# Patient Record
Sex: Male | Born: 1945 | Race: White | Hispanic: No | Marital: Married | Smoking: Former smoker
Health system: Southern US, Community
[De-identification: ages and names within clinical notes are randomized; demographics above are authoritative.]

## PROBLEM LIST (undated history)

## (undated) DIAGNOSIS — G709 Myoneural disorder, unspecified: Secondary | ICD-10-CM

## (undated) DIAGNOSIS — E041 Nontoxic single thyroid nodule: Secondary | ICD-10-CM

## (undated) DIAGNOSIS — G4733 Obstructive sleep apnea (adult) (pediatric): Secondary | ICD-10-CM

## (undated) DIAGNOSIS — R011 Cardiac murmur, unspecified: Secondary | ICD-10-CM

## (undated) DIAGNOSIS — E785 Hyperlipidemia, unspecified: Secondary | ICD-10-CM

## (undated) DIAGNOSIS — R053 Chronic cough: Secondary | ICD-10-CM

## (undated) DIAGNOSIS — M1990 Unspecified osteoarthritis, unspecified site: Secondary | ICD-10-CM

## (undated) DIAGNOSIS — T7840XA Allergy, unspecified, initial encounter: Secondary | ICD-10-CM

## (undated) DIAGNOSIS — D649 Anemia, unspecified: Secondary | ICD-10-CM

## (undated) DIAGNOSIS — C801 Malignant (primary) neoplasm, unspecified: Secondary | ICD-10-CM

## (undated) DIAGNOSIS — E291 Testicular hypofunction: Secondary | ICD-10-CM

## (undated) DIAGNOSIS — K648 Other hemorrhoids: Secondary | ICD-10-CM

## (undated) DIAGNOSIS — I10 Essential (primary) hypertension: Secondary | ICD-10-CM

## (undated) DIAGNOSIS — K228 Other specified diseases of esophagus: Secondary | ICD-10-CM

## (undated) DIAGNOSIS — G4730 Sleep apnea, unspecified: Secondary | ICD-10-CM

## (undated) DIAGNOSIS — K2281 Esophageal polyp: Secondary | ICD-10-CM

## (undated) DIAGNOSIS — R05 Cough: Secondary | ICD-10-CM

## (undated) DIAGNOSIS — F419 Anxiety disorder, unspecified: Secondary | ICD-10-CM

## (undated) DIAGNOSIS — E119 Type 2 diabetes mellitus without complications: Secondary | ICD-10-CM

## (undated) DIAGNOSIS — Z8719 Personal history of other diseases of the digestive system: Secondary | ICD-10-CM

## (undated) DIAGNOSIS — I350 Nonrheumatic aortic (valve) stenosis: Secondary | ICD-10-CM

## (undated) DIAGNOSIS — E039 Hypothyroidism, unspecified: Secondary | ICD-10-CM

## (undated) DIAGNOSIS — G629 Polyneuropathy, unspecified: Secondary | ICD-10-CM

## (undated) DIAGNOSIS — Z5189 Encounter for other specified aftercare: Secondary | ICD-10-CM

## (undated) DIAGNOSIS — K219 Gastro-esophageal reflux disease without esophagitis: Secondary | ICD-10-CM

## (undated) HISTORY — DX: Personal history of other diseases of the digestive system: Z87.19

## (undated) HISTORY — DX: Sleep apnea, unspecified: G47.30

## (undated) HISTORY — DX: Nonrheumatic aortic (valve) stenosis: I35.0

## (undated) HISTORY — PX: TONSILLECTOMY: SUR1361

## (undated) HISTORY — DX: Myoneural disorder, unspecified: G70.9

## (undated) HISTORY — DX: Allergy, unspecified, initial encounter: T78.40XA

## (undated) HISTORY — PX: BIOPSY THYROID: PRO38

## (undated) HISTORY — DX: Unspecified osteoarthritis, unspecified site: M19.90

## (undated) HISTORY — DX: Anemia, unspecified: D64.9

## (undated) HISTORY — PX: PROSTATE BIOPSY: SHX241

## (undated) HISTORY — DX: Testicular hypofunction: E29.1

## (undated) HISTORY — DX: Other specified diseases of esophagus: K22.8

## (undated) HISTORY — DX: Obstructive sleep apnea (adult) (pediatric): G47.33

## (undated) HISTORY — DX: Encounter for other specified aftercare: Z51.89

## (undated) HISTORY — PX: ANAL FISSURE REPAIR: SHX2312

## (undated) HISTORY — DX: Gastro-esophageal reflux disease without esophagitis: K21.9

## (undated) HISTORY — DX: Type 2 diabetes mellitus without complications: E11.9

## (undated) HISTORY — DX: Polyneuropathy, unspecified: G62.9

## (undated) HISTORY — DX: Chronic cough: R05.3

## (undated) HISTORY — PX: UPPER GASTROINTESTINAL ENDOSCOPY: SHX188

## (undated) HISTORY — DX: Essential (primary) hypertension: I10

## (undated) HISTORY — DX: Esophageal polyp: K22.81

## (undated) HISTORY — PX: NOSE SURGERY: SHX723

## (undated) HISTORY — DX: Hyperlipidemia, unspecified: E78.5

## (undated) HISTORY — DX: Anxiety disorder, unspecified: F41.9

## (undated) HISTORY — PX: OTHER SURGICAL HISTORY: SHX169

## (undated) HISTORY — DX: Other hemorrhoids: K64.8

## (undated) HISTORY — DX: Cough: R05

---

## 1997-07-25 ENCOUNTER — Encounter: Payer: Self-pay | Admitting: Pulmonary Disease

## 1997-09-02 ENCOUNTER — Encounter: Payer: Self-pay | Admitting: Pulmonary Disease

## 1997-09-02 ENCOUNTER — Ambulatory Visit: Admission: RE | Admit: 1997-09-02 | Discharge: 1997-09-02 | Payer: Self-pay

## 1997-10-07 ENCOUNTER — Encounter: Payer: Self-pay | Admitting: Pulmonary Disease

## 1998-02-27 ENCOUNTER — Other Ambulatory Visit: Admission: RE | Admit: 1998-02-27 | Discharge: 1998-02-27 | Payer: Self-pay | Admitting: Internal Medicine

## 1999-05-28 HISTORY — PX: LAPAROSCOPIC NISSEN FUNDOPLICATION: SHX1932

## 1999-06-12 ENCOUNTER — Encounter: Payer: Self-pay | Admitting: Surgery

## 1999-06-17 ENCOUNTER — Inpatient Hospital Stay (HOSPITAL_COMMUNITY): Admission: RE | Admit: 1999-06-17 | Discharge: 1999-06-19 | Payer: Self-pay | Admitting: Surgery

## 1999-06-18 ENCOUNTER — Encounter: Payer: Self-pay | Admitting: Surgery

## 2000-10-13 ENCOUNTER — Encounter: Payer: Self-pay | Admitting: Internal Medicine

## 2000-10-13 ENCOUNTER — Ambulatory Visit (HOSPITAL_COMMUNITY): Admission: RE | Admit: 2000-10-13 | Discharge: 2000-10-13 | Payer: Self-pay | Admitting: Internal Medicine

## 2001-01-07 ENCOUNTER — Ambulatory Visit (HOSPITAL_COMMUNITY): Admission: RE | Admit: 2001-01-07 | Discharge: 2001-01-07 | Payer: Self-pay | Admitting: Pulmonary Disease

## 2001-01-07 ENCOUNTER — Encounter: Payer: Self-pay | Admitting: Pulmonary Disease

## 2001-01-10 ENCOUNTER — Ambulatory Visit (HOSPITAL_COMMUNITY): Admission: RE | Admit: 2001-01-10 | Discharge: 2001-01-10 | Payer: Self-pay | Admitting: Pulmonary Disease

## 2001-01-12 ENCOUNTER — Ambulatory Visit (HOSPITAL_COMMUNITY): Admission: RE | Admit: 2001-01-12 | Discharge: 2001-01-12 | Payer: Self-pay | Admitting: Pulmonary Disease

## 2001-04-20 ENCOUNTER — Ambulatory Visit (HOSPITAL_COMMUNITY): Admission: RE | Admit: 2001-04-20 | Discharge: 2001-04-20 | Payer: Self-pay | Admitting: Pulmonary Disease

## 2001-04-20 ENCOUNTER — Encounter: Payer: Self-pay | Admitting: Pulmonary Disease

## 2001-07-18 ENCOUNTER — Encounter: Payer: Self-pay | Admitting: Internal Medicine

## 2002-01-16 ENCOUNTER — Encounter: Payer: Self-pay | Admitting: Neurosurgery

## 2002-01-16 ENCOUNTER — Encounter: Admission: RE | Admit: 2002-01-16 | Discharge: 2002-01-16 | Payer: Self-pay | Admitting: Neurosurgery

## 2003-10-27 ENCOUNTER — Encounter: Admission: RE | Admit: 2003-10-27 | Discharge: 2003-10-27 | Payer: Self-pay | Admitting: Neurosurgery

## 2004-01-31 ENCOUNTER — Ambulatory Visit: Payer: Self-pay | Admitting: Pulmonary Disease

## 2004-02-12 ENCOUNTER — Ambulatory Visit: Payer: Self-pay | Admitting: Pulmonary Disease

## 2004-02-17 ENCOUNTER — Encounter (HOSPITAL_COMMUNITY): Admission: RE | Admit: 2004-02-17 | Discharge: 2004-03-28 | Payer: Self-pay | Admitting: Internal Medicine

## 2004-02-17 ENCOUNTER — Ambulatory Visit: Payer: Self-pay | Admitting: Internal Medicine

## 2004-02-18 ENCOUNTER — Encounter: Payer: Self-pay | Admitting: Internal Medicine

## 2004-02-18 ENCOUNTER — Ambulatory Visit (HOSPITAL_COMMUNITY): Admission: RE | Admit: 2004-02-18 | Discharge: 2004-02-18 | Payer: Self-pay | Admitting: Internal Medicine

## 2004-02-21 ENCOUNTER — Ambulatory Visit: Payer: Self-pay | Admitting: Internal Medicine

## 2004-03-04 ENCOUNTER — Ambulatory Visit: Payer: Self-pay | Admitting: Internal Medicine

## 2004-03-20 ENCOUNTER — Ambulatory Visit: Payer: Self-pay | Admitting: Pulmonary Disease

## 2004-10-08 ENCOUNTER — Ambulatory Visit: Payer: Self-pay | Admitting: Pulmonary Disease

## 2005-03-29 HISTORY — PX: KNEE ARTHROSCOPY: SUR90

## 2005-04-06 ENCOUNTER — Ambulatory Visit: Payer: Self-pay | Admitting: Pulmonary Disease

## 2006-03-07 ENCOUNTER — Ambulatory Visit: Payer: Self-pay | Admitting: Pulmonary Disease

## 2006-03-07 LAB — CONVERTED CEMR LAB
ALT: 28 units/L (ref 0–40)
AST: 21 units/L (ref 0–37)
Albumin: 3.9 g/dL (ref 3.5–5.2)
Alkaline Phosphatase: 76 units/L (ref 39–117)
Basophils Relative: 0 % (ref 0.0–1.0)
Calcium: 9.3 mg/dL (ref 8.4–10.5)
Chloride: 105 meq/L (ref 96–112)
Creatinine, Ser: 0.9 mg/dL (ref 0.4–1.5)
GFR calc non Af Amer: 92 mL/min
HCT: 48.2 % (ref 39.0–52.0)
MCHC: 33.7 g/dL (ref 30.0–36.0)
Monocytes Absolute: 0.5 10*3/uL (ref 0.2–0.7)
Monocytes Relative: 5.2 % (ref 3.0–11.0)
Neutro Abs: 7.8 10*3/uL — ABNORMAL HIGH (ref 1.4–7.7)
PSA: 1.9 ng/mL (ref 0.10–4.00)
Platelets: 272 10*3/uL (ref 150–400)
Potassium: 4.3 meq/L (ref 3.5–5.1)
RBC: 5.25 M/uL (ref 4.22–5.81)
Sed Rate: 18 mm/hr (ref 0–20)

## 2006-08-18 ENCOUNTER — Ambulatory Visit: Payer: Self-pay | Admitting: Pulmonary Disease

## 2006-08-18 LAB — CONVERTED CEMR LAB
ALT: 29 units/L (ref 0–40)
AST: 22 units/L (ref 0–37)
Albumin: 3.9 g/dL (ref 3.5–5.2)
Alkaline Phosphatase: 71 units/L (ref 39–117)
Chloride: 107 meq/L (ref 96–112)
Eosinophils Absolute: 0.2 10*3/uL (ref 0.0–0.6)
Eosinophils Relative: 2.9 % (ref 0.0–5.0)
GFR calc Af Amer: 111 mL/min
GFR calc non Af Amer: 92 mL/min
Lymphocytes Relative: 21.3 % (ref 12.0–46.0)
MCV: 88 fL (ref 78.0–100.0)
Neutrophils Relative %: 67.1 % (ref 43.0–77.0)
Platelets: 262 10*3/uL (ref 150–400)
Potassium: 4.1 meq/L (ref 3.5–5.1)
RBC: 4.95 M/uL (ref 4.22–5.81)
RDW: 12.5 % (ref 11.5–14.6)
TSH: 1.33 microintl units/mL (ref 0.35–5.50)
Total Bilirubin: 0.9 mg/dL (ref 0.3–1.2)
Total Protein: 6.5 g/dL (ref 6.0–8.3)
WBC: 7.6 10*3/uL (ref 4.5–10.5)

## 2007-03-21 ENCOUNTER — Telehealth: Payer: Self-pay | Admitting: Pulmonary Disease

## 2007-07-04 ENCOUNTER — Telehealth: Payer: Self-pay | Admitting: Pulmonary Disease

## 2007-07-05 ENCOUNTER — Encounter: Payer: Self-pay | Admitting: Pulmonary Disease

## 2007-07-05 DIAGNOSIS — K219 Gastro-esophageal reflux disease without esophagitis: Secondary | ICD-10-CM

## 2007-07-05 DIAGNOSIS — R51 Headache: Secondary | ICD-10-CM

## 2007-07-05 DIAGNOSIS — I10 Essential (primary) hypertension: Secondary | ICD-10-CM

## 2007-07-05 DIAGNOSIS — G4733 Obstructive sleep apnea (adult) (pediatric): Secondary | ICD-10-CM

## 2007-07-05 DIAGNOSIS — E669 Obesity, unspecified: Secondary | ICD-10-CM

## 2007-07-05 DIAGNOSIS — K589 Irritable bowel syndrome without diarrhea: Secondary | ICD-10-CM

## 2007-07-05 DIAGNOSIS — F411 Generalized anxiety disorder: Secondary | ICD-10-CM

## 2007-07-05 DIAGNOSIS — R519 Headache, unspecified: Secondary | ICD-10-CM | POA: Insufficient documentation

## 2007-07-18 ENCOUNTER — Ambulatory Visit: Payer: Self-pay | Admitting: Pulmonary Disease

## 2007-07-22 DIAGNOSIS — G629 Polyneuropathy, unspecified: Secondary | ICD-10-CM | POA: Insufficient documentation

## 2008-03-11 ENCOUNTER — Telehealth: Payer: Self-pay | Admitting: Pulmonary Disease

## 2008-03-13 ENCOUNTER — Telehealth: Payer: Self-pay | Admitting: Pulmonary Disease

## 2008-03-21 ENCOUNTER — Ambulatory Visit: Payer: Self-pay | Admitting: Pulmonary Disease

## 2008-03-25 ENCOUNTER — Telehealth (INDEPENDENT_AMBULATORY_CARE_PROVIDER_SITE_OTHER): Payer: Self-pay | Admitting: *Deleted

## 2008-03-27 ENCOUNTER — Telehealth (INDEPENDENT_AMBULATORY_CARE_PROVIDER_SITE_OTHER): Payer: Self-pay | Admitting: *Deleted

## 2008-03-27 ENCOUNTER — Emergency Department (HOSPITAL_COMMUNITY): Admission: EM | Admit: 2008-03-27 | Discharge: 2008-03-27 | Payer: Self-pay | Admitting: Family Medicine

## 2008-03-28 ENCOUNTER — Telehealth (INDEPENDENT_AMBULATORY_CARE_PROVIDER_SITE_OTHER): Payer: Self-pay | Admitting: *Deleted

## 2008-04-17 ENCOUNTER — Telehealth (INDEPENDENT_AMBULATORY_CARE_PROVIDER_SITE_OTHER): Payer: Self-pay | Admitting: *Deleted

## 2008-04-17 ENCOUNTER — Ambulatory Visit: Payer: Self-pay | Admitting: Pulmonary Disease

## 2008-04-17 DIAGNOSIS — M1990 Unspecified osteoarthritis, unspecified site: Secondary | ICD-10-CM

## 2008-04-21 LAB — CONVERTED CEMR LAB
ALT: 29 units/L (ref 0–53)
Bacteria, UA: NEGATIVE
Bilirubin, Direct: 0.1 mg/dL (ref 0.0–0.3)
CO2: 32 meq/L (ref 19–32)
Chloride: 106 meq/L (ref 96–112)
Cholesterol: 161 mg/dL (ref 0–200)
Creatinine, Ser: 0.9 mg/dL (ref 0.4–1.5)
Direct LDL: 72 mg/dL
Eosinophils Absolute: 0.5 10*3/uL (ref 0.0–0.7)
Glucose, Bld: 104 mg/dL — ABNORMAL HIGH (ref 70–99)
HCT: 42.5 % (ref 39.0–52.0)
Hemoglobin, Urine: NEGATIVE
Hemoglobin: 14.9 g/dL (ref 13.0–17.0)
MCHC: 35.1 g/dL (ref 30.0–36.0)
MCV: 91.5 fL (ref 78.0–100.0)
Mucus, UA: NEGATIVE
Neutro Abs: 4.6 10*3/uL (ref 1.4–7.7)
Neutrophils Relative %: 61.8 % (ref 43.0–77.0)
Nitrite: NEGATIVE
PSA: 1.63 ng/mL (ref 0.10–4.00)
RBC / HPF: NONE SEEN
RBC: 4.65 M/uL (ref 4.22–5.81)
Sodium: 141 meq/L (ref 135–145)
Squamous Epithelial / LPF: NEGATIVE /lpf
TSH: 1.28 microintl units/mL (ref 0.35–5.50)
Total Bilirubin: 0.8 mg/dL (ref 0.3–1.2)
Total Protein, Urine: NEGATIVE mg/dL
Urine Glucose: NEGATIVE mg/dL
VLDL: 48 mg/dL — ABNORMAL HIGH (ref 0–40)
WBC, UA: NONE SEEN cells/hpf
WBC: 7.4 10*3/uL (ref 4.5–10.5)
pH: 6 (ref 5.0–8.0)

## 2008-05-06 ENCOUNTER — Telehealth (INDEPENDENT_AMBULATORY_CARE_PROVIDER_SITE_OTHER): Payer: Self-pay | Admitting: *Deleted

## 2008-06-22 ENCOUNTER — Encounter: Payer: Self-pay | Admitting: Pulmonary Disease

## 2008-07-20 ENCOUNTER — Encounter: Payer: Self-pay | Admitting: Pulmonary Disease

## 2008-08-16 ENCOUNTER — Telehealth: Payer: Self-pay | Admitting: Internal Medicine

## 2008-08-19 ENCOUNTER — Ambulatory Visit: Payer: Self-pay | Admitting: Internal Medicine

## 2008-08-19 DIAGNOSIS — R1319 Other dysphagia: Secondary | ICD-10-CM | POA: Insufficient documentation

## 2008-08-22 ENCOUNTER — Encounter: Payer: Self-pay | Admitting: Pulmonary Disease

## 2008-08-30 ENCOUNTER — Encounter: Payer: Self-pay | Admitting: Internal Medicine

## 2008-08-30 ENCOUNTER — Ambulatory Visit: Payer: Self-pay | Admitting: Internal Medicine

## 2008-08-30 ENCOUNTER — Encounter: Payer: Self-pay | Admitting: Pulmonary Disease

## 2008-09-02 ENCOUNTER — Encounter: Payer: Self-pay | Admitting: Internal Medicine

## 2008-09-16 DIAGNOSIS — K648 Other hemorrhoids: Secondary | ICD-10-CM | POA: Insufficient documentation

## 2008-09-16 DIAGNOSIS — Z8719 Personal history of other diseases of the digestive system: Secondary | ICD-10-CM

## 2008-09-16 DIAGNOSIS — D130 Benign neoplasm of esophagus: Secondary | ICD-10-CM

## 2008-09-16 DIAGNOSIS — Z862 Personal history of diseases of the blood and blood-forming organs and certain disorders involving the immune mechanism: Secondary | ICD-10-CM

## 2008-09-20 ENCOUNTER — Ambulatory Visit: Payer: Self-pay | Admitting: Internal Medicine

## 2008-11-01 ENCOUNTER — Telehealth: Payer: Self-pay | Admitting: Pulmonary Disease

## 2008-12-25 ENCOUNTER — Ambulatory Visit: Payer: Self-pay | Admitting: Pulmonary Disease

## 2008-12-25 ENCOUNTER — Encounter: Payer: Self-pay | Admitting: Adult Health

## 2008-12-25 DIAGNOSIS — E559 Vitamin D deficiency, unspecified: Secondary | ICD-10-CM | POA: Insufficient documentation

## 2008-12-25 DIAGNOSIS — E785 Hyperlipidemia, unspecified: Secondary | ICD-10-CM

## 2008-12-25 LAB — CONVERTED CEMR LAB: Vit D, 25-Hydroxy: 24 ng/mL — ABNORMAL LOW (ref 30–89)

## 2008-12-26 ENCOUNTER — Encounter: Payer: Self-pay | Admitting: Adult Health

## 2008-12-26 LAB — CONVERTED CEMR LAB
BUN: 12 mg/dL (ref 6–23)
Basophils Relative: 0.4 % (ref 0.0–3.0)
CO2: 33 meq/L — ABNORMAL HIGH (ref 19–32)
Calcium: 8.8 mg/dL (ref 8.4–10.5)
Eosinophils Absolute: 0.2 10*3/uL (ref 0.0–0.7)
Eosinophils Relative: 3.2 % (ref 0.0–5.0)
GFR calc non Af Amer: 90.79 mL/min (ref >60)
HDL: 30.3 mg/dL — ABNORMAL LOW (ref >39.00)
Hemoglobin: 15 g/dL (ref 13.0–17.0)
MCHC: 34.2 g/dL (ref 30.0–36.0)
Monocytes Absolute: 0.4 10*3/uL (ref 0.1–1.0)
Neutro Abs: 5 10*3/uL (ref 1.4–7.7)
Platelets: 201 10*3/uL (ref 150.0–400.0)
Potassium: 4 meq/L (ref 3.5–5.1)
RDW: 12.7 % (ref 11.5–14.6)
Sodium: 143 meq/L (ref 135–145)

## 2009-05-21 ENCOUNTER — Ambulatory Visit: Payer: Self-pay | Admitting: Pulmonary Disease

## 2009-05-24 LAB — CONVERTED CEMR LAB
ALT: 25 units/L (ref 0–53)
Albumin: 3.9 g/dL (ref 3.5–5.2)
BUN: 13 mg/dL (ref 6–23)
Basophils Relative: 0.2 % (ref 0.0–3.0)
Bilirubin, Direct: 0.2 mg/dL (ref 0.0–0.3)
CO2: 31 meq/L (ref 19–32)
Calcium: 9 mg/dL (ref 8.4–10.5)
Chloride: 106 meq/L (ref 96–112)
Cholesterol: 173 mg/dL (ref 0–200)
Creatinine, Ser: 1.2 mg/dL (ref 0.4–1.5)
Eosinophils Absolute: 0.1 10*3/uL (ref 0.0–0.7)
Glucose, Bld: 106 mg/dL — ABNORMAL HIGH (ref 70–99)
HDL: 37.3 mg/dL — ABNORMAL LOW (ref >39.00)
Hemoglobin: 14.8 g/dL (ref 13.0–17.0)
Lymphocytes Relative: 14.8 % (ref 12.0–46.0)
MCHC: 33.1 g/dL (ref 30.0–36.0)
Monocytes Relative: 11.1 % (ref 3.0–12.0)
Neutrophils Relative %: 72.8 % (ref 43.0–77.0)
PSA: 1.65 ng/mL (ref 0.10–4.00)
RBC: 4.76 M/uL (ref 4.22–5.81)
Total Protein: 6.9 g/dL (ref 6.0–8.3)
Triglycerides: 102 mg/dL (ref 0.0–149.0)
Vit D, 25-Hydroxy: 34 ng/mL (ref 30–89)
WBC: 6.9 10*3/uL (ref 4.5–10.5)

## 2009-05-26 ENCOUNTER — Telehealth (INDEPENDENT_AMBULATORY_CARE_PROVIDER_SITE_OTHER): Payer: Self-pay | Admitting: *Deleted

## 2010-02-04 ENCOUNTER — Encounter: Payer: Self-pay | Admitting: Cardiology

## 2010-02-04 ENCOUNTER — Ambulatory Visit: Payer: Self-pay | Admitting: Cardiology

## 2010-02-04 DIAGNOSIS — R0602 Shortness of breath: Secondary | ICD-10-CM | POA: Insufficient documentation

## 2010-02-06 ENCOUNTER — Telehealth: Payer: Self-pay | Admitting: Cardiology

## 2010-02-09 ENCOUNTER — Telehealth: Payer: Self-pay | Admitting: Cardiology

## 2010-02-12 ENCOUNTER — Telehealth (INDEPENDENT_AMBULATORY_CARE_PROVIDER_SITE_OTHER): Payer: Self-pay | Admitting: Radiology

## 2010-02-16 ENCOUNTER — Ambulatory Visit: Payer: Self-pay

## 2010-02-16 ENCOUNTER — Telehealth: Payer: Self-pay | Admitting: Cardiology

## 2010-02-16 ENCOUNTER — Ambulatory Visit: Payer: Self-pay | Admitting: Cardiology

## 2010-02-16 ENCOUNTER — Ambulatory Visit: Payer: Self-pay | Admitting: Cardiovascular Disease

## 2010-02-16 ENCOUNTER — Ambulatory Visit (HOSPITAL_COMMUNITY): Admission: RE | Admit: 2010-02-16 | Discharge: 2010-02-16 | Payer: Self-pay | Admitting: Cardiology

## 2010-02-17 LAB — CONVERTED CEMR LAB
Basophils Relative: 0.6 % (ref 0.0–3.0)
Calcium: 9.1 mg/dL (ref 8.4–10.5)
Chloride: 105 meq/L (ref 96–112)
Creatinine, Ser: 1.2 mg/dL (ref 0.4–1.5)
Eosinophils Relative: 3.4 % (ref 0.0–5.0)
Hemoglobin: 14.9 g/dL (ref 13.0–17.0)
Lymphocytes Relative: 19.3 % (ref 12.0–46.0)
MCV: 90.5 fL (ref 78.0–100.0)
Neutrophils Relative %: 68.1 % (ref 43.0–77.0)
RBC: 4.74 M/uL (ref 4.22–5.81)
Sodium: 142 meq/L (ref 135–145)
Testosterone: 159.68 ng/dL — ABNORMAL LOW (ref 350.00–890.00)
WBC: 8.8 10*3/uL (ref 4.5–10.5)

## 2010-02-23 ENCOUNTER — Telehealth (INDEPENDENT_AMBULATORY_CARE_PROVIDER_SITE_OTHER): Payer: Self-pay | Admitting: *Deleted

## 2010-02-26 ENCOUNTER — Ambulatory Visit: Payer: Self-pay | Admitting: Cardiology

## 2010-02-26 ENCOUNTER — Telehealth (INDEPENDENT_AMBULATORY_CARE_PROVIDER_SITE_OTHER): Payer: Self-pay | Admitting: *Deleted

## 2010-03-03 ENCOUNTER — Encounter: Payer: Self-pay | Admitting: Pulmonary Disease

## 2010-04-02 ENCOUNTER — Ambulatory Visit
Admission: RE | Admit: 2010-04-02 | Discharge: 2010-04-02 | Payer: Self-pay | Source: Home / Self Care | Attending: Cardiology | Admitting: Cardiology

## 2010-04-09 ENCOUNTER — Telehealth (INDEPENDENT_AMBULATORY_CARE_PROVIDER_SITE_OTHER): Payer: Self-pay | Admitting: *Deleted

## 2010-04-09 ENCOUNTER — Ambulatory Visit
Admission: RE | Admit: 2010-04-09 | Discharge: 2010-04-09 | Payer: Self-pay | Source: Home / Self Care | Attending: Cardiology | Admitting: Cardiology

## 2010-04-09 ENCOUNTER — Other Ambulatory Visit: Payer: Self-pay | Admitting: Cardiology

## 2010-04-09 LAB — BASIC METABOLIC PANEL
BUN: 17 mg/dL (ref 6–23)
CO2: 32 mEq/L (ref 19–32)
Calcium: 8.9 mg/dL (ref 8.4–10.5)
Chloride: 102 mEq/L (ref 96–112)
Creatinine, Ser: 1.1 mg/dL (ref 0.4–1.5)
GFR: 72.49 mL/min (ref >60.00)
Glucose, Bld: 99 mg/dL (ref 70–99)
Potassium: 4 mEq/L (ref 3.5–5.1)
Sodium: 138 mEq/L (ref 135–145)

## 2010-04-13 LAB — TESTOSTERONE: Testosterone: 204.65 ng/dL — ABNORMAL LOW (ref 350.00–890.00)

## 2010-04-15 ENCOUNTER — Telehealth: Payer: Self-pay | Admitting: Cardiology

## 2010-04-28 NOTE — Progress Notes (Signed)
Summary: fax labs  Phone Note Call from Patient Call back at [phone]   Caller: Patient Call For: nadel Summary of Call: please fax blood work results to pt - [phone] Initial call taken by: Eugene Gavia,  May 26, 2009 9:35 AM  Follow-up for Phone Call        faxed labs//Juanita Follow-up by: Darletta Moll,  May 26, 2009 10:48 AM

## 2010-04-28 NOTE — Progress Notes (Signed)
Summary: RE BLOOD WORK ORDER  Phone Note Call from Patient Call back at Home Phone [phone]   Caller: Spouse/FAYE Reason for Call: Talk to Nurse Summary of Call: PT WIFE WOULD LIKE TO TALK TO A NURSE RE BLOOD WORK ORDER. PT WOULD LIKE TO ADD SOME MORE TEST. Initial call taken by: Roe Coombs,  February 09, 2010 9:31 AM  Follow-up for Phone Call        spoke with pt wife, pt is scheduled for bmp on 02/16/10. they questioned if pt could get tsh and testerone done at that time for dr nadel. will foward for dr Jens Som review Deliah Goody, RN  February 09, 2010 3:47 PM\par  Additional Follow-up for Phone Call Additional follow up Details #1::        ok Ferman Hamming, MD, [facility]  February 10, 2010 9:19 AM  labs ordered Deliah Goody, RN  February 10, 2010 10:36 AM

## 2010-04-28 NOTE — Progress Notes (Signed)
Summary: talk to nurse  Phone Note Call from Patient Call back at [phone]   Caller: Patient Call For: Ronald King Reason for Call: Talk to Nurse Summary of Call: Patient wanting to speak to Dr. Jodelle Green nurse.  Pt would not give anymore info. Initial call taken by: Lehman Prom,  February 23, 2010 11:24 AM  Follow-up for Phone Call        Spoke with pt.  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  He states that he had this before and was txed with "pills", but d/c'ed a few years ago due to increased BP.  He states that he feels like he has the flu "all the time" and relates this to low T.  Would like to know what SN's recs are.  I will print labs for his review.  Pls advise thanks! Follow-up by: Vernie Murders,  February 23, 2010 2:16 PM  Additional Follow-up for Phone Call Additional follow up Details #1::        called and spoke with pt and he is aware per SN---since the low T  we can either try androgel 5 grams---packets--#30   rub contents into skin daily----or refer to urology---explained this to the pt and he is wanting to know about restarting the striant 30mg --he used to take 2 tablets daily for this but it caused his BP to be elevated and he was told to stop this med---he wants to know if this med will be ok to restart at a lower dose.  please advise. thanks Randell Loop CMA  February 23, 2010 5:36 PM     Additional Follow-up for Phone Call Additional follow up Details #2::    per SN----he can try the gel but if that does not work then we will  need to send him to urology for eval. Randell Loop CMA  February 24, 2010 8:51 AM    PT informed taht Androgel was called to pharmacy.  Pt will give it a try and let us know if it is helping. Abigail Miyamoto RN  February 24, 2010 9:04 AM   New/Updated Medications: ANDROGEL 50 MG/5GM GEL (TESTOSTERONE) Apply one packet to skin daily Prescriptions: ANDROGEL  50 MG/5GM GEL (TESTOSTERONE) Apply one packet to skin daily  #30 x 6   Entered by:   Abigail Miyamoto RN   Authorized by:   Michele Mcalpine MD   Signed by:   Abigail Miyamoto RN on 02/24/2010   Method used:   Telephoned to ...       [facility]  [address] [id number]* (retail)       [address]       [place], [place]  [address]       Ph: [phone] or [phone]       Fax: [phone]   RxID:   [id number]

## 2010-04-28 NOTE — Progress Notes (Signed)
  Phone Note Other Incoming   Caller: Ronald King here for testing Summary of Call: Ronald King brought a copy of his bp readings as we had ask him to track it at home. he reports bp in the am ranging from 127/73 to 143/85 and reports higher bp in the evenings ranging from 153/82 to 146/82. he reports some dizziness in the evenings with the elevated bp. will foward for dr Jens Som review. Deliah Goody, RN  February 16, 2010 10:00 AM   Follow-up for Phone Call        add lisinopril 10 mg by mouth daily; bmet one week Ferman Hamming, MD, [facility]  February 17, 2010 11:14 AM  Left message to call back Deliah Goody, RN  February 17, 2010 1:49 PM  Ronald King returning call [phone] Judie Grieve  February 17, 2010 2:00 PM   Additional Follow-up for Phone Call Additional follow up Details #1::        spoke with Ronald King, med called to pharm. he will have labs rechecked next week Deliah Goody, RN  February 17, 2010 3:36 PM     New/Updated Medications: LISINOPRIL 10 MG TABS (LISINOPRIL) Take one tablet by mouth daily Prescriptions: LISINOPRIL 10 MG TABS (LISINOPRIL) Take one tablet by mouth daily  #30 x 12   Entered by:   Deliah Goody, RN   Authorized by:   Ferman Hamming, MD, [facility]   Signed by:   Deliah Goody, RN on 02/17/2010   Method used:   Electronically to        [facility] [id number]* (retail)       [address]       [place], [place]  [address]       Ph: [phone] or [phone]       Fax: [phone]   RxID:   [id number]

## 2010-04-28 NOTE — Progress Notes (Signed)
Summary: Androgel PA - form faxed  Phone Note Call from Patient Call back at [phone]   Caller: Patient Call For: nadel Summary of Call: Pt states that pharmcacy hasn't filled his rx for androgel yet, calling to inquire about this.[facility] [address] Initial call taken by: Darletta Moll,  February 26, 2010 2:32 PM  Follow-up for Phone Call        Spoke with pt.  He states prior auth needed for androgel.  PA request received from [facility].    Called [employer] to initiate PA.  Case # [id number] -- awaiting fax   Follow-up by: Gweneth Dimitri RN,  February 26, 2010 3:03 PM  Additional Follow-up for Phone Call Additional follow up Details #1::        PA received from [employer] and placed on SN's cart to address.  Gweneth Dimitri RN  February 26, 2010 3:42 PM  PA form filled out and signed by [facility] and faxed back to [employer].  the pt has been called and informed of this.  will hold in PA inbox in EMR. Boone Master CNA/MA  February 26, 2010 5:32 PM     Additional Follow-up for Phone Call Additional follow up Details #2::    Approval letter recieved- med approved from 02/05/10 until 02/25/15.  Faxed this information to [facility] [address] and approval letter given to front desk to be scanned in.  I spoke with pt and advised PA approved.  Follow-up by: Vernie Murders,  March 02, 2010 4:50 PM

## 2010-04-28 NOTE — Assessment & Plan Note (Signed)
Summary: rov ///kp   Primary Care Provider:  Lorin Picket Zyann Mabry,MD  CC:  13 month ROV & review of mult medical problems....  History of Present Illness: 65 y/o WM here for a follow up visit... he has a chronic cough x years and is dependent on codeine containing cough syrups... he was miserable when ENDAL HD went off the market and we had to substitute PHENERGAN EXPECTORANT w/ CODEINE - 1 tsp by mouth Q6H as needed for cough, limiting him to 1 pint per month...    ~  April 17, 2008:  he notes that he's been more SOB recently and went to [facility] for eval w/ neb Rx given + Avelox for bronchitis & improved... we discussed Asthmatic Bronchitis and decided to Rx w/ Symbicort 80- 2spBid... he wants to be sure we do full labs w/ his physical- including VitD level...   ~  May 21, 2009:  Yearly ROV w/ coincident URI- cough, min sputum, no color or blood, temp to 101 as noted> we discussed checking CXR/ labs- Rx w/ ZPak, Advair, Mucinex, Fluids... he has mult somatic complaints- stopped the Symbicort stating that it gave him HAs...    Current Problem List:  Hx of SINUSITIS (ICD-473.9) - on NASONEX 2spBid + he requests Rx for AMOXICILLIN to keep on hand for travel... prev eval DrByers- "he wanted to do surg"... "my sinuses are crazy" but he refuses to consider the surg... also had dysphonia eval at [facility] in the 90's w/ Botox tried.  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 saw DrClance 12/09- he felt upper airway resistance syndrome... doing better w/ new CPAP machine in 2010.  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 he had an extensive eval in the past w/ GI, ENT, Pulm, etc... he uses PHEN EXPECT w/ CODEINE 1 tsp Q6H up to 1 pint/mo...  ~  CXR 1/10 showed clear,  just min biapical pleural thickening...  ~  2/11: states intol to Symbicort w/ HAs... try ADVAIR 250Bid...  ~  CXR 2/11 showed clear lungs, NAD...  HYPERTENSION (ICD-401.9) - on NORVASC 10mg /d... BP= 124/768... denies  HA, fatigue, visual changes, CP, palipit, dizziness, syncope, dyspnea, edema, etc...  DYSLIPIDEMIA (ICD-272.4) - hx elevated TG & unable to control on diet alone... now on TRICOR 145mg /d...  ~  FLP 1/10 showed TChol 161, TG 239, HDL 30, LDL 72... he wants to control w/ diet.  ~  FLP 9/10 showed TChol 192, TG 426, HDL 30, LDL 80... rec> start [id number].  ~  FLP 2/11 showed TChol 173, TG 102, HDL 37, LDL 115... continue Tricor Rx + diet.  OBESITY (ICD-278.00) - weight = 267#,  6\' 2"  tall = BMI of 34-35... discussed diet + exercise and the need for weight reduction both from his overall health status and his OSA.Marland Kitchen.  ~  weight 1/10 = 267#  ~  weight 2/11 = 272#... discussed again!  GERD (ICD-530.81) - on NEXIUM 40mg Bid & REGLAN 10mg  Qhs... s/p lap nissen 3/01 by DrMartin....  ~  EGD 11/05 showed prev Nissen, +gastritis...  ~  EGD 6/10 by DrDBrodie showed functioning Nissen, post-op changes, sessile polyp in distal esoph= benign mucosa.  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  ~  f/u colonoscopy 6/10 by DrDBrodie showed negative x for int hems...  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 ? didn't tol the hormone medication...   ~  labs 1/10 showed PSA= 1.63  ~  labs 2/11 showed PSA= 1.65  DEGENERATIVE JOINT DISEASE (ICD-715.90) - he had left knee arthroscopy in 2007 by DrDean to remove cartilage...  VITAMIN D DEFICIENCY (ICD-268.9) - on Vit D OTC 2000 u daily...  ~  labs 1/10 showed Vit D level = 16... Vit D 2000 u daily started.  ~  labs 2/11 showed Vit D level = 34... continue supplement.  HEADACHE (ICD-784.0) - Chr daily migraines in the past w/ prev HA eval by DrFreeman (Botox shots helped in the past)...  NEUROPATHY (ICD-355.9) - c/o burning in legs and eval from Neuro in [facility] & Rx w/ Neurontin + Hydrocodone for pain... this may be part of a familial trait as several relatives  have severe periph neuropathy (?hereditary syndrome)- we don't have notes from [facility] neurology.  ANXIETY (ICD-300.00) - on PAXIL 10mg /d & KLONOPIN 1mg  as needed... they were prev perscribed by ?psychiatrist? but he notes "I've been on these for >24yrs"... he still sees DrStevens, but we fill his perscriptions.  Health Maintenance - he takes ASA 81mg /d, Vits + Vit C... he is up to date on all [facility]...   Allergies: 1)  Ceftin (Cefuroxime Axetil) 2)  Singulair (Montelukast Sodium)  Comments:  Nurse/Medical Assistant: The patient's medications and allergies were reviewed with the patient and were updated in the Medication and Allergy Lists.  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 Father died age 26 w/ cerebral aneurysm, hx alcoholism Mother alive age 41 in nursing home w/ severe neuropathy 3 Siblings- all brothers w/ AFib, migraines... Family History of Colitis/Crohn's: Daughter ? crohns Family History of Heart Disease: Father, Grandparents, Maternal Uncle Family History of Diabetes: Maternal Uncle No FH of Colon Cancer:  Social History: Reviewed history from 04/17/2008 and no changes required. Married- 43 yrs 1 Daughter- ?hx Crohn's disease, stress... 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  The patient denies anorexia, fever, weight loss, weight gain, vision loss, decreased hearing, hoarseness, chest pain, syncope, dyspnea on exertion, peripheral edema, headaches, hemoptysis, abdominal pain, melena, hematochezia, severe indigestion/heartburn, hematuria, incontinence, muscle weakness, suspicious skin lesions, transient blindness, difficulty walking, depression, unusual weight change, abnormal bleeding, enlarged lymph nodes, and angioedema.    Vital Signs:  Patient profile:   66 year old male Height:      74 inches Weight:      272.25 pounds O2 Sat:      97 % on Room air Temp:     101 degrees F oral Pulse rate:   97 / minute BP sitting:   124 / 76  (left arm) Cuff size:   large  Vitals Entered By: Randell Loop CMA (May 21, 2009 10:12 AM)  O2 Sat at Rest %:  97 O2 Flow:  Room air CC: 13 month ROV & review of mult medical  problems... Is Patient Diabetic? No Comments meds updated today   Physical Exam  Additional Exam:  WD, Obese, 65 y/o WM in NAD... GENERAL:  Alert & oriented; pleasant & cooperative... HEENT:  [place]/AT, EOM-wnl, PERRLA, EACs-clear, TMs-wnl, NOSE-clear, THROAT-clear & wnl. NECK:  Supple w/ fairROM; no JVD; normal carotid impulses w/o bruits; no thyromegaly or nodules palpated; no lymphadenopathy. CHEST:  Clear to P & A; without wheezes/ rales/ or rhonchi; he has an intermittent dry irritative cough... HEART:  Regular Rhythm; without murmurs/ rubs/ or gallops. ABDOMEN:  Obese, soft & nontender; normal bowel sounds; no organomegaly or masses detected.  EXT: without deformities, mild arthritic changes; no varicose veins/ venous insuffic/ or edema. NEURO:  CN's intact; motor testing normal; no focal neuro deficits DERM:  No lesions noted; no rash  etc...    CXR  Procedure date:  05/21/2009  Findings:      CHEST - 2 VIEW Comparison: PA and lateral chest 04/17/2008.   Findings: Lungs are clear.  Heart size is normal.  No pleural effusion or focal bony abnormality.   IMPRESSION: Negative chest.   Read By:  Charyl Dancer,  M.D.   MISC. Report  Procedure date:  05/21/2009  Findings:      Lipid Panel (LIPID)   Cholesterol               173 mg/dL                   3-810   Triglycerides             102.0 mg/dL                 1.7-510.2   HDL                  [L]  58.52 mg/dL                 >77.82   LDL Cholesterol      [H]  423 mg/dL                   5-36  BMP (METABOL)   Sodium                    140 mEq/L                   135-145   Potassium                 4.2 mEq/L                   3.5-5.1   Chloride                  106 mEq/L                   96-112   Carbon Dioxide            31 mEq/L                    19-32   Glucose              [H]  106 mg/dL                   14-43   BUN                       13 mg/dL  6-23   Creatinine                1.2 mg/dL                   1.1-9.1   Calcium                   9.0 mg/dL                   4.7-82.9   GFR                       65.06 mL/min                >60  Hepatic/Liver Function Panel (HEPATIC)   Total Bilirubin           0.8 mg/dL                   5.6-2.1   Direct Bilirubin          0.2 mg/dL                   3.0-8.6   Alkaline Phosphatase      53 U/L                      39-117   AST                       23 U/L                      0-37   ALT                       25 U/L                      0-53   Total Protein             6.9 g/dL                    5.7-8.4   Albumin                   3.9 g/dL                    6.9-6.2  Comments:      CBC Platelet w/Diff (CBCD)   White Cell Count          6.9 K/uL                    4.5-10.5   Red Cell Count            4.76 Mil/uL                 4.22-5.81   Hemoglobin                14.8 g/dL                    95.2-84.1   Hematocrit                44.7 %                      39.0-52.0   MCV  94.0 fl                     78.0-100.0   Platelet Count            188.0 K/uL                  150.0-400.0   Neutrophil %              72.8 %                      43.0-77.0   Lymphocyte %              14.8 %                      12.0-46.0   Monocyte %                11.1 %                      3.0-12.0   Eosinophils%              1.1 %                       0.0-5.0   Basophils %               0.2 %                       0.0-3.0  TSH (TSH)   FastTSH                   1.31 uIU/mL                 0.35-5.50  Prostate Specific Antigen (PSA)   PSA-Hyb                   1.65 ng/mL                  0.10-4.00  Vitamin D (25-Hydroxy) (10272)  Vitamin D (25-Hydroxy)                             34 ng/mL                    30-89    Impression & Recommendations:  Problem # 1:  UPPER RESPIRATORY INFECTION (ICD-465.9) We will Rx w/ ZPak on this occas... His updated medication list for this problem includes:    Promethazine-codeine 6.25-10 Mg/99ml Syrp (Promethazine-codeine) .Marland Kitchen... Take 1 tsp by mouth q 6 h as needed for severe cough... not to exceed 1 pint per month.    Aspirin Adult Low Strength 81 Mg Tbec (Aspirin) .Marland Kitchen... Take 1 tablet by mouth once a day  Orders: T-2 View CXR (71020TC)  Problem # 2:  Hx of SINUSITIS (ICD-473.9) He wants refill of Amox for Prn use. His updated medication list for this problem includes:    Nasonex 50 Mcg/act Susp (Mometasone furoate) .Marland Kitchen... 2 sprays in each nostril two times a day...    Promethazine-codeine 6.25-10 Mg/2ml Syrp (Promethazine-codeine) .Marland Kitchen... Take 1 tsp by mouth q 6 h as needed for severe cough... not to exceed 1 pint per month.    Amoxicillin 500 Mg Caps (Amoxicillin) .Marland Kitchen... Take 1 cap by mouth three times a day for infection...    Zithromax Z-pak 250  Mg Tabs (Azithromycin) .Marland Kitchen... Take as directed...  Problem # 3:  OBSTRUCTIVE  SLEEP APNEA (ICD-327.23) Continue the CPAP Rx...  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 he is quite dependent on the Phen Expect w/ Codeine... Orders: T-2 View CXR (71020TC)  Problem # 5:  HYPERTENSION (ICD-401.9) Controlled-  same meds. His updated medication list for this problem includes:    Norvasc 10 Mg Tabs (Amlodipine besylate) .Marland Kitchen... Take 1 tablet by mouth once a day  Problem # 6:  DYSLIPIDEMIA (ICD-272.4) Improved on the Tricor... continue same. His updated medication list for this problem includes:    Tricor 145 Mg Tabs (Fenofibrate) .Marland Kitchen... Take 1 tablet by mouth once a day  Problem # 7:  OBESITY (ICD-278.00) We discussed weight loss again...  Problem # 8:  GERD (ICD-530.81) Followed by drDBrodie... continue present Rx. The following medications were removed from the medication list:    Prevacid 30 Mg Cpdr (Lansoprazole) .Marland Kitchen... Take 1 capsule by mouth two times a day His updated medication list for this problem includes:    Nexium 40 Mg Cpdr (Esomeprazole magnesium) .Marland Kitchen... 1 capsule twice a day 30 minutes before meals  Problem # 9:  VITAMIN D DEFICIENCY (ICD-268.9) Vit D level imoproved... continue Vit D supplement.  Problem # 10:  OTHER PROBLEMS AS NOTED>>>  Complete Medication List: 1)  Nasonex 50 Mcg/act Susp (Mometasone furoate) .... 2 sprays in each nostril two times a day... 2)  Advair Diskus 250-50 Mcg/dose Aepb (Fluticasone-salmeterol) .Marland Kitchen.. 1 inhalation two times a day... 3)  Promethazine-codeine 6.25-10 Mg/69ml Syrp (Promethazine-codeine) .... Take 1 tsp by mouth q 6 h as needed for severe cough... not to exceed 1 pint per month. 4)  Aspirin Adult Low Strength 81 Mg Tbec (Aspirin) .... Take 1 tablet by mouth once a day 5)  Norvasc 10 Mg Tabs (Amlodipine besylate) .... Take 1 tablet by mouth once a day 6)  Tricor 145 Mg Tabs (Fenofibrate) .... Take 1 tablet by mouth once a day 7)  Nexium 40 Mg Cpdr  (Esomeprazole magnesium) .Marland Kitchen.. 1 capsule twice a day 30 minutes before meals 8)  Reglan 10 Mg Tabs (Metoclopramide hcl) .... Take 1 tab by mouth at bedtime 9)  Calcium-vitamin D 250-125 Mg-unit Tabs (Calcium carbonate-vitamin d) .... Take 1 tablet by mouth once a day 10)  Centrum Silver Tabs (Multiple vitamins-minerals) .... Take 1 tablet by mouth once a day 11)  Vitamin D 2000 Unit Tabs (Cholecalciferol) .... Take 1 tablet by mouth once a day 12)  Paxil 10 Mg Tabs (Paroxetine hcl) .... Take 1 tablet by mouth  once daily 13)  Klonopin 1 Mg Tabs (Clonazepam) .... Take 1 tablet by mouth two times a day as needed for nerves... 14)  Amoxicillin 500 Mg Caps (Amoxicillin) .... Take 1 cap by mouth three times a day for infection... 15)  Zithromax Z-pak 250 Mg Tabs (Azithromycin) .... Take as directed...  Other Orders: Prescription Created Electronically [id number]) TLB-Lipid Panel (80061-LIPID) TLB-BMP (Basic Metabolic Panel-BMET) (80048-METABOL) TLB-Hepatic/Liver Function Pnl (80076-HEPATIC) TLB-CBC Platelet - w/Differential (85025-CBCD) TLB-TSH (Thyroid Stimulating Hormone) (84443-TSH) TLB-PSA (Prostate Specific Antigen) (84153-PSA) T-Vitamin D (25-Hydroxy) (78295-62130)  Patient Instructions: 1)  Today we updated your med list- see below.... 2)  We refilled your meds for 2011... 3)  Today we wrote a new perscription for ADVAIR- one inhalation two times a day... 4)  We also did your follow up CXR & FASTING blood work... please call the "phone  tree" in a few days for your lab results.Marland KitchenMarland Kitchen 5)  Let's get on track w/ our diet + exercise- the goal is to lose 15-20 lbs... 6)  Call for any questions.Marland KitchenMarland Kitchen 7)  Please schedule a follow-up appointment in 1 year, sooner as needed... Prescriptions: ZITHROMAX Z-PAK 250 MG TABS (AZITHROMYCIN) take as directed...  #1 x 0   Entered and Authorized by:   Michele Mcalpine MD   Signed by:   Michele Mcalpine MD on 05/21/2009   Method used:   Print then Give to Patient    RxID:   [id number] AMOXICILLIN 500 MG CAPS (AMOXICILLIN) take 1 cap by mouth three times a day for infection...  #21 x 2   Entered and Authorized by:   Michele Mcalpine MD   Signed by:   Michele Mcalpine MD on 05/21/2009   Method used:   Print then Give to Patient   RxID:   [id number] REGLAN 10 MG TABS (METOCLOPRAMIDE HCL) Take 1 tab by mouth at bedtime  #30 x prn   Entered and Authorized by:   Michele Mcalpine MD   Signed by:   Michele Mcalpine MD on 05/21/2009   Method used:   Print then Give to Patient   RxID:   [id number] NEXIUM 40 MG  CPDR (ESOMEPRAZOLE MAGNESIUM) 1 capsule twice a day 30 minutes before meals  #60 x prn   Entered and Authorized by:   Michele Mcalpine MD   Signed by:   Michele Mcalpine MD on 05/21/2009   Method used:   Print then Give to Patient   RxID:   [id number] TRICOR 145 MG TABS (FENOFIBRATE) Take 1 tablet by mouth once a day  #30 x prn   Entered and Authorized by:   Michele Mcalpine MD   Signed by:   Michele Mcalpine MD on 05/21/2009   Method used:   Print then Give to Patient   RxID:   [id number] PROMETHAZINE-CODEINE 6.25-10 MG/5ML  SYRP (PROMETHAZINE-CODEINE) take 1 tsp by mouth Q 6 H as needed for severe cough... not to exceed 1 pint per month.  #1 pint x prn   Entered and Authorized by:   Michele Mcalpine MD   Signed by:   Michele Mcalpine MD on 05/21/2009   Method used:   Print then Give to Patient   RxID:   [id number] ADVAIR DISKUS 250-50 MCG/DOSE AEPB (FLUTICASONE-SALMETEROL) 1 inhalation two times a day...  #1 x prn   Entered and Authorized by:   Michele Mcalpine MD   Signed by:   Michele Mcalpine MD on 05/21/2009   Method used:   Print then Give to Patient   RxID:   [id number] NASONEX 50 MCG/ACT SUSP (MOMETASONE FUROATE) 2 sprays in each nostril two times a day...  #1 x prn   Entered and Authorized by:   Michele Mcalpine MD   Signed by:   Michele Mcalpine MD on 05/21/2009   Method used:   Print then Give to Patient   RxID:    [id number] NORVASC 10 MG  TABS (AMLODIPINE BESYLATE) Take 1 tablet by mouth once a day  #30 x prn   Entered and Authorized by:   Michele Mcalpine MD   Signed by:   Michele Mcalpine MD on 05/21/2009   Method used:   Print then Give to Patient   RxID:   [id number]

## 2010-04-28 NOTE — Progress Notes (Signed)
Summary: Stress Echo Info  Phone Note Outgoing Call Call back at Home Phone [phone]   Call placed by: Harlow Asa, CNMT Call placed to: Patient Reason for Call: Confirm/change Appt Summary of Call: Instructions given for Stress Echo.

## 2010-04-28 NOTE — Progress Notes (Signed)
Summary: question on meds  Phone Note Call from Patient Call back at cell [phone]   Caller: Patient Reason for Call: Talk to Nurse Summary of Call: pt has question on meds. Initial call taken by: Roe Coombs,  February 06, 2010 1:48 PM  Follow-up for Phone Call        Phone Call Completed PT CORRECT IN TAKING 1/2 TAB OF HCTZ 25 MG  Follow-up by: Scherrie Bateman, LPN,  February 06, 2010 2:51 PM

## 2010-04-28 NOTE — Assessment & Plan Note (Signed)
Summary: np6/sob,blood pressure/self ref/bcbs  Medications Added PAXIL CR 25 MG XR24H-TAB (PAROXETINE HCL) Take 1 tablet by mouth once a day HYDROCODONE-ACETAMINOPHEN 2.5-500 MG TABS (HYDROCODONE-ACETAMINOPHEN) as needed ZICAM COLD REMEDY  LIQD (HOMEOPATHIC PRODUCTS) as needed RA CALCIUM PLUS VITAMIN D 600-400 MG-UNIT TABS (CALCIUM CARBONATE-VITAMIN D) Take 1 tablet by mouth once a day VITAMIN C CR 500 MG CR-CAPS (ASCORBIC ACID) Take 1 capsule by mouth once a day VITAMIN B-6 CR 200 MG CR-TABS (PYRIDOXINE HCL) Take 1 tablet by mouth once a day VITAMIN B-12 2500 MCG SUBL (CYANOCOBALAMIN) Take 1 tablet by mouth once a day AMOXICILLIN 500 MG CAPS (AMOXICILLIN) as needed HYDROCHLOROTHIAZIDE 25 MG TABS (HYDROCHLOROTHIAZIDE) 1/2 TAB once daily        Visit Type:  Follow-up Primary Provider:  Lorin Picket [name],MD  CC:  Sob-aching in ribs-fatigue.  History of Present Illness: 65 year old male with no prior cardiac history for evaluation of dyspnea and chest pain as well as hypertension. Patient has had hypertension for years. It has worsened recently and he increased his Norvasc from 5-10 mg. His systolic continues to be in the 140-150 range. He also notices some dyspnea on exertion but no orthopnea or PND. He has chronic mild pedal edema. He also notices pain in his chest at times. He does have a history of chronic bronchitis with a chronic cough. He feels sore in his ribs. He also occasionally has pain in the left upper chest area and he attributed to indigestion. Note he does not have exertional chest pain. Because of the above we were asked to further evaluate.   Current Medications (verified): 1)  Nasonex 50 Mcg/act Susp (Mometasone Furoate) .... 2 Sprays in Each Nostril Two Times A Day... 2)  Promethazine-Codeine 6.25-10 Mg/30ml  Syrp (Promethazine-Codeine) .... Take 1 Tsp By Mouth Q 6 H As Needed For Severe Cough... Not To Exceed 1 Pint Per Month. 3)  Aspirin Adult Low Strength 81 Mg Tbec (Aspirin)  .... Take 1 Tablet By Mouth Once A Day 4)  Norvasc 10 Mg  Tabs (Amlodipine Besylate) .... Take 1 Tablet By Mouth Once A Day 5)  Tricor 145 Mg Tabs (Fenofibrate) .... Take 1 Tablet By Mouth Once A Day 6)  Nexium 40 Mg  Cpdr (Esomeprazole Magnesium) .Marland Kitchen.. 1 Capsule Twice A Day 30 Minutes Before Meals 7)  Centrum Silver  Tabs (Multiple Vitamins-Minerals) .... Take 1 Tablet By Mouth Once A Day 8)  Vitamin D 2000 Unit Tabs (Cholecalciferol) .... Take 1 Tablet By Mouth Once A Day 9)  Paxil Cr 25 Mg Xr24h-Tab (Paroxetine Hcl) .... Take 1 Tablet By Mouth Once A Day 10)  Klonopin 1 Mg  Tabs (Clonazepam) .... Take 1 Tablet By Mouth Two Times A Day As Needed For Nerves... 11)  Hydrocodone-Acetaminophen 2.5-500 Mg Tabs (Hydrocodone-Acetaminophen) .... As Needed 12)  Zicam Cold Remedy  Liqd (Homeopathic Products) .... As Needed 13)  Ra Calcium Plus Vitamin D 600-400 Mg-Unit Tabs (Calcium Carbonate-Vitamin D) .... Take 1 Tablet By Mouth Once A Day 14)  Vitamin C Cr 500 Mg Cr-Caps (Ascorbic Acid) .... Take 1 Capsule By Mouth Once A Day 15)  Vitamin B-6 Cr 200 Mg Cr-Tabs (Pyridoxine Hcl) .... Take 1 Tablet By Mouth Once A Day 16)  Vitamin B-12 2500 Mcg Subl (Cyanocobalamin) .... Take 1 Tablet By Mouth Once A Day 17)  Amoxicillin 500 Mg Caps (Amoxicillin) .... As Needed  Allergies: 1)  Ceftin (Cefuroxime Axetil) 2)  Singulair (Montelukast Sodium)  Past History:  Past Medical History: DYSLIPIDEMIA  HYPERTENSION  POLYPS, ESOPHAGEAL ANEMIA, HX OF GASTROINTESTINAL HEMORRHAGE, HX OF  OBSTRUCTIVE SLEEP APNEA Chronic bronchitis and cough GERD  HEMORRHOIDS, INTERNAL DEGENERATIVE JOINT DISEASE  VITAMIN D DEFICIENCY NEUROPATHY   Past Surgical History: S/P lap nissen fundoplication by Dr Daphine Deutscher 3/01 S/P left knee arthroscopy in 2007 by Dr August Saucer Tonsillectomy Nasal Surgery Anal fissure Left hand fracture/surgery  Family History: Reviewed history from 09/16/2008 and no changes required. Father died age  50 w/ cerebral aneurysm, hx alcoholism Mother alive age 69 in nursing home w/ severe neuropathy 3 Siblings- all brothers w/ AFib, migraines... Family History of Colitis/Crohn's: Daughter ? crohns Family History of Diabetes: Maternal Uncle Brother with CABG at age 45  Social History: Reviewed history from 04/17/2008 and no changes required. Married- 43 yrs 1 Daughter- ?hx Crohn's disease, stress... 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 Remaining systems are negative.   Vital Signs:  Patient profile:   65 year old male Height:      74 inches Weight:      270.25 pounds BMI:     34.82 Pulse rate:   81 / minute Resp:     18 per minute BP sitting:   155 / 80  (left arm) Cuff size:   large  Vitals Entered By: Vikki Ports (February 04, 2010 10:33 AM)  Physical Exam  General:  Well developed/well nourished in NAD Skin warm/dry Patient not depressed No peripheral clubbing Back-normal HEENT-normal/normal eyelids Neck supple/normal carotid upstroke bilaterally; no bruits; no JVD; no thyromegaly chest - CTA/ normal expansion CV - RRR/normal S1 and S2; no murmurs, rubs or gallops;  PMI nondisplaced Abdomen -NT/ND, no HSM, no mass, + bowel sounds, no bruit 2+ femoral pulses, no bruits Ext-trace edema, no chords, 2+ DP Neuro-grossly nonfocal     EKG  Procedure date:  02/04/2010  Findings:      Sinus rhythm with occasional PACs. No ST changes.  Impression & Recommendations:  Problem # 1:  CHEST PAIN UNSPECIFIED (ICD-786.50) Symptoms atypical and most likely musculoskeletal. However also complains of dyspnea. Scheduled stress echocardiogram  to exclude ischemia and to quantify LV function. His updated medication list for this problem includes:    Aspirin Adult Low Strength 81 Mg Tbec (Aspirin) .Marland Kitchen... Take 1 tablet by mouth once a day    Norvasc 10 Mg Tabs (Amlodipine besylate) .Marland Kitchen... Take 1 tablet by mouth once a day  Problem # 2:  DYSPNEA (ICD-786.05)  Stress echocardiogram as described above. His updated medication list for this problem includes:    Aspirin Adult Low Strength 81 Mg Tbec (Aspirin) .Marland Kitchen... Take 1 tablet by mouth once a day    Norvasc 10 Mg Tabs (Amlodipine besylate) .Marland Kitchen... Take 1 tablet by mouth once a day    Hydrochlorothiazide 25 Mg Tabs (Hydrochlorothiazide) .Marland Kitchen... 1/2 tab once daily  His updated medication list for this problem includes:    Aspirin Adult Low Strength 81 Mg Tbec (Aspirin) .Marland Kitchen... Take 1 tablet by mouth once a day    Norvasc 10 Mg Tabs (Amlodipine besylate) .Marland Kitchen... Take 1 tablet by mouth once a day  Problem # 3:  HYPERTENSION (ICD-401.9) Blood pressure mildly elevated. Add HCTZ 12.5 mg p.o. daily. Check potassium and renal function  in one week. Patient will monitor her blood pressure at home and we will review it in 8 weeks and adjust his regimen as indicated. His updated medication list for this problem includes:    Aspirin Adult Low Strength 81 Mg Tbec (Aspirin) .Marland Kitchen... Take 1 tablet by mouth once a day    Norvasc 10 Mg Tabs (Amlodipine besylate) .Marland Kitchen... Take 1 tablet by mouth once a day    Hydrochlorothiazide 25 Mg Tabs (Hydrochlorothiazide) .Marland Kitchen... 1/2 tab once daily  Problem # 4:  DYSLIPIDEMIA (ICD-272.4) Managed by primary care. His updated medication list for this problem includes:    Tricor 145 Mg Tabs (Fenofibrate) .Marland Kitchen... Take 1 tablet by mouth once a day  Problem # 5:  OBSTRUCTIVE SLEEP APNEA (ICD-327.23)  Problem # 6:  GERD (ICD-530.81)  His updated medication list for this problem includes:    Nexium 40 Mg Cpdr (Esomeprazole magnesium) .Marland Kitchen... 1 capsule twice a day 30 minutes before  meals  His updated medication list for this problem includes:    Nexium 40 Mg Cpdr (Esomeprazole magnesium) .Marland Kitchen... 1 capsule twice a day 30 minutes before meals  Other Orders: EKG w/ Interpretation (93000) Stress Echo (Stress Echo)  Patient Instructions: 1)  Your physician recommends that you schedule a follow-up appointment in: 8 WEEKS WITH DR CRENSHAW 2)  Your physician recommends that you return for lab work in:1 WEEK BMET V58.69 3)  Your physician has recommended you make the following change in your medication: ADD HCTZ 12.5 MG 1 once daily  4)  Your physician has requested that you have a stress echocardiogram. For further information please visit [email or web address]  Please follow instruction sheet as given. Prescriptions: HYDROCHLOROTHIAZIDE 25 MG TABS (HYDROCHLOROTHIAZIDE) 1/2 TAB once daily  #30 x 11   Entered by:   Scherrie Bateman, LPN   Authorized by:   Ferman Hamming, MD, [facility]   Signed by:   Scherrie Bateman, LPN on 56/38/7564   Method used:   Electronically to        [id number]* (retail)       [address]       [place], [place]  [address]       Ph: [phone] or [phone]       Fax: [phone]   RxID:   [id number]

## 2010-04-28 NOTE — Medication Information (Signed)
Summary: Tax adviser   Imported By: Valinda Hoar 03/03/2010 09:21:32  _____________________________________________________________________  External Attachment:    Type:   Image     Comment:   External Document

## 2010-04-30 NOTE — Progress Notes (Signed)
Summary: Lab ADD ON for Testosterone  level  Phone Note Call from Patient   Caller: patient-walked in Call For: nadel Reason for Call: Lab or Test Results Summary of Call: Pt walked in stating that Dr. Jens Som wanted him to come by the office and have SN add on a lab at [facility] office for [facility] level. This has been okayed by Randell Loop to send order. Initial call taken by: Reynaldo Minium CMA,  April 09, 2010 11:55 AM

## 2010-04-30 NOTE — Assessment & Plan Note (Signed)
Summary: per check out/sf   Primary Provider:  Lorin Picket [name],MD  CC:  check up.  History of Present Illness: 65 year old male with no prior cardiac history I saw in Nov 2011 for evaluation of dyspnea and chest pain as well as hypertension. Stress echocardiogram in November of 2011 was normal. Since I saw him previously the patient denies any dyspnea on exertion, orthopnea, PND, pedal edema, palpitations, syncope or chest pain. He has taken his blood pressure at home and his systolic is typically in the 130 to 140 range.   Current Medications (verified): 1)  Nasonex 50 Mcg/act Susp (Mometasone Furoate) .... 2 Sprays in Each Nostril Two Times A Day... 2)  Promethazine-Codeine 6.25-10 Mg/44ml  Syrp (Promethazine-Codeine) .... Take 1 Tsp By Mouth Q 6 H As Needed For Severe Cough... Not To Exceed 1 Pint Per Month. 3)  Aspirin Adult Low Strength 81 Mg Tbec (Aspirin) .... Take 1 Tablet By Mouth Once A Day 4)  Norvasc 10 Mg  Tabs (Amlodipine Besylate) .... Take 1 Tablet By Mouth Once A Day 5)  Tricor 145 Mg Tabs (Fenofibrate) .... Take 1 Tablet By Mouth Once A Day 6)  Nexium 40 Mg  Cpdr (Esomeprazole Magnesium) .Marland Kitchen.. 1 Capsule Twice A Day 30 Minutes Before Meals 7)  Centrum Silver  Tabs (Multiple Vitamins-Minerals) .... Take 1 Tablet By Mouth Once A Day 8)  Vitamin D 2000 Unit Tabs (Cholecalciferol) .... Take 1 Tablet By Mouth Once A Day 9)  Paxil Cr 25 Mg Xr24h-Tab (Paroxetine Hcl) .... Take 1 Tablet By Mouth Once A Day 10)  Klonopin 1 Mg  Tabs (Clonazepam) .... Take 1 Tablet By Mouth Two Times A Day As Needed For Nerves... 11)  Hydrocodone-Acetaminophen 2.5-500 Mg Tabs (Hydrocodone-Acetaminophen) .... As Needed 12)  Zicam Cold Remedy  Liqd (Homeopathic Products) .... As Needed 13)  Ra Calcium Plus Vitamin D 600-400 Mg-Unit Tabs (Calcium Carbonate-Vitamin D) .... Take 1 Tablet By Mouth Once A Day 14)  Vitamin C Cr 500 Mg Cr-Caps (Ascorbic Acid) .... Take 1 Capsule By Mouth Once A Day 15)  Vitamin B-6  Cr 200 Mg Cr-Tabs (Pyridoxine Hcl) .... Take 1 Tablet By Mouth Once A Day 16)  Vitamin B-12 2500 Mcg Subl (Cyanocobalamin) .... Take 1 Tablet By Mouth Once A Day 17)  Amoxicillin 500 Mg Caps (Amoxicillin) .... As Needed 18)  Hydrochlorothiazide 25 Mg Tabs (Hydrochlorothiazide) .... 1/2 Tab Once Daily 19)  Lisinopril 10 Mg Tabs (Lisinopril) .... Take One Tablet By Mouth Daily 20)  Androgel 50 Mg/5gm Gel (Testosterone) .... Apply One Packet To Skin Daily  Allergies: 1)  Ceftin (Cefuroxime Axetil) 2)  Singulair (Montelukast Sodium)  Past History:  Past Medical History: Reviewed history from 02/04/2010 and no changes required. DYSLIPIDEMIA  HYPERTENSION  POLYPS, ESOPHAGEAL ANEMIA, HX OF GASTROINTESTINAL HEMORRHAGE, HX OF  OBSTRUCTIVE SLEEP APNEA Chronic bronchitis and cough GERD  HEMORRHOIDS, INTERNAL DEGENERATIVE JOINT DISEASE  VITAMIN D DEFICIENCY NEUROPATHY   Past Surgical History: Reviewed history from 02/04/2010 and no changes required. S/P lap nissen fundoplication by Dr Daphine Deutscher 3/01 S/P left knee arthroscopy in 2007 by Dr August Saucer Tonsillectomy Nasal Surgery Anal fissure Left hand fracture/surgery  Social History: Reviewed history from 02/04/2010 and no changes required. Married- 43 yrs 1 Daughter- ?hx Crohn's disease, stress... 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 Remaining systems are negative.  Vital Signs:  Patient profile:   65 year old male Height:      74 inches Weight:      269 pounds BMI:     34.66 Pulse rate:   80 / minute Resp:     16 per minute BP sitting:   118 / 78  (left arm)  Vitals Entered By: Kem Parkinson (April 02, 2010  9:16 AM)  Physical Exam  General:  Well-developed well-nourished in no acute distress.  Skin is warm and dry.  HEENT is normal.  Neck is supple. No thyromegaly.  Chest is clear to auscultation with normal expansion.  Cardiovascular exam is regular rate and rhythm.  Abdominal exam nontender or distended. No masses palpated. Extremities show no edema. neuro grossly intact    Impression & Recommendations:  Problem # 1:  DYSPNEA (ICD-786.05)  Improved. Stress echo normal. No further cardiac workup. His updated medication list for this problem includes:    Aspirin Adult Low Strength 81 Mg Tbec (Aspirin) .Marland Kitchen... Take 1 tablet by mouth once a day    Norvasc 10 Mg Tabs (Amlodipine besylate) .Marland Kitchen... Take 1 tablet by mouth once a day    Hydrochlorothiazide 25 Mg Tabs (Hydrochlorothiazide) .Marland Kitchen... 1/2 tab once daily    Lisinopril 20 Mg Tabs (Lisinopril) .Marland Kitchen... Take one tablet by mouth daily  His updated medication list for this problem includes:    Aspirin Adult Low Strength 81 Mg Tbec (Aspirin) .Marland Kitchen... Take 1 tablet by mouth once a day    Norvasc 10 Mg Tabs (Amlodipine besylate) .Marland Kitchen... Take 1 tablet by mouth once a day    Hydrochlorothiazide 25 Mg Tabs (Hydrochlorothiazide) .Marland Kitchen... 1/2 tab once daily    Lisinopril 20 Mg Tabs (Lisinopril) .Marland Kitchen... Take one tablet by mouth daily  Problem # 2:  CHEST PAIN UNSPECIFIED (ICD-786.50) As per #1. His updated medication list for this problem includes:    Aspirin Adult Low Strength 81 Mg Tbec (Aspirin) .Marland Kitchen... Take 1 tablet by mouth once a day    Norvasc 10 Mg Tabs (Amlodipine besylate) .Marland Kitchen... Take 1 tablet by mouth once a day    Lisinopril 20 Mg Tabs (Lisinopril) .Marland Kitchen... Take one tablet by mouth daily  His updated medication list for this problem includes:    Aspirin Adult Low Strength 81 Mg Tbec (Aspirin) .Marland Kitchen... Take 1 tablet by mouth once a day    Norvasc 10 Mg Tabs (Amlodipine besylate) .Marland Kitchen... Take 1 tablet by mouth once a day    Lisinopril 20 Mg Tabs  (Lisinopril) .Marland Kitchen... Take one tablet by mouth daily  Problem # 3:  HYPERTENSION (ICD-401.9) His blood pressure is mildly elevated at home. I will increase his lisinopril to 20 mg p.o. daily. Check potassium and renal function in one week. He will continue to monitor his blood pressure at home and followup with his primary care physician for further management. His updated medication list for this problem includes:    Aspirin Adult Low Strength 81 Mg Tbec (Aspirin) .Marland Kitchen... Take 1 tablet by mouth once a day    Norvasc 10 Mg Tabs (Amlodipine besylate) .Marland Kitchen... Take 1 tablet by mouth once a day    Hydrochlorothiazide 25 Mg Tabs (Hydrochlorothiazide) .Marland Kitchen... 1/2 tab once daily    Lisinopril 20 Mg Tabs (Lisinopril) .Marland Kitchen... Take one tablet by mouth daily  His updated medication list for this problem includes:    Aspirin Adult Low  Strength 81 Mg Tbec (Aspirin) .Marland Kitchen... Take 1 tablet by mouth once a day    Norvasc 10 Mg Tabs (Amlodipine besylate) .Marland Kitchen... Take 1 tablet by mouth once a day    Hydrochlorothiazide 25 Mg Tabs (Hydrochlorothiazide) .Marland Kitchen... 1/2 tab once daily    Lisinopril 20 Mg Tabs (Lisinopril) .Marland Kitchen... Take one tablet by mouth daily  Problem # 4:  DYSLIPIDEMIA (ICD-272.4) Magement per primary care. His updated medication list for this problem includes:    Tricor 145 Mg Tabs (Fenofibrate) .Marland Kitchen... Take 1 tablet by mouth once a day  Problem # 5:  OBSTRUCTIVE SLEEP APNEA (ICD-327.23)  Patient Instructions: 1)  Your physician recommends that you return for lab work in:ONE WEEK 2)  Your physician has recommended you make the following change in your medication: INCREASE LISINOPRIL 20MG  ONCE DAILY Prescriptions: LISINOPRIL 20 MG TABS (LISINOPRIL) Take one tablet by mouth daily  #30 x 12   Entered by:   Deliah Goody, RN   Authorized by:   Ferman Hamming, MD, [facility]   Signed by:   Deliah Goody, RN on 04/02/2010   Method used:   Electronically to        [facility]  [address] [id number]* (retail)       [address]       [place], [place]  [address]       Ph: [phone] or [phone]       Fax: [phone]   RxID:   [id number]

## 2010-04-30 NOTE — Progress Notes (Signed)
Summary: pt needs report schedule  Phone Note Call from Patient Call back at [phone]   Caller: Patient Reason for Call: Talk to Nurse, Talk to Doctor Summary of Call: Stanton Kidney faxed lad report to patient yesterday and he didn't get all of it and wants to talk to someone so he can get the rest of the re[port fax# [phone] Initial call taken by: Omer Jack,  April 15, 2010 9:56 AM  Follow-up for Phone Call        04/15/10--1100am--all labs faxed to pt's private fax machine Follow-up by: Ledon Snare, RN,  April 15, 2010 11:06 AM

## 2010-05-01 ENCOUNTER — Telehealth: Payer: Self-pay | Admitting: Pulmonary Disease

## 2010-05-08 ENCOUNTER — Encounter: Payer: Self-pay | Admitting: Pulmonary Disease

## 2010-05-14 NOTE — Progress Notes (Signed)
Summary: PAapproved for Nexium  Phone Note Outgoing Call   Call placed by: Michel Bickers CMA,  May 01, 2010 9:21 AM Call placed to: [facility] [phone] Summary of Call: PA initiated for Nexium for twice a day.  No covered alternatives.  Member # is [id number].  Awaiting fax.  Michel Bickers CMA  May 01, 2010 9:23 AM  Form received and placed on SN cart.  Will forward to Leigh so she is aware.Michel Bickers [facility]  May 01, 2010 10:02 AM  Follow-up for Phone Call        PA has been signed and faxed back---placed in triage bin for PA's Randell Loop CMA  May 01, 2010 11:27 AM   Auth approved from 04-10-10 until 05-01-11. Pharmacy andpt aware. Carron Curie CMA  May 06, 2010 9:40 AM

## 2010-05-14 NOTE — Medication Information (Signed)
Summary: Tax adviser   Imported By: Valinda Hoar 05/08/2010 16:31:54  _____________________________________________________________________  External Attachment:    Type:   Image     Comment:   External Document

## 2010-05-19 ENCOUNTER — Telehealth (INDEPENDENT_AMBULATORY_CARE_PROVIDER_SITE_OTHER): Payer: Self-pay | Admitting: *Deleted

## 2010-05-21 ENCOUNTER — Other Ambulatory Visit: Payer: Self-pay | Admitting: Pulmonary Disease

## 2010-05-21 ENCOUNTER — Encounter (INDEPENDENT_AMBULATORY_CARE_PROVIDER_SITE_OTHER): Payer: Self-pay | Admitting: *Deleted

## 2010-05-21 ENCOUNTER — Other Ambulatory Visit: Payer: BC Managed Care – PPO

## 2010-05-21 DIAGNOSIS — Z0000 Encounter for general adult medical examination without abnormal findings: Secondary | ICD-10-CM

## 2010-05-21 DIAGNOSIS — E785 Hyperlipidemia, unspecified: Secondary | ICD-10-CM

## 2010-05-21 LAB — HEPATIC FUNCTION PANEL
Alkaline Phosphatase: 44 U/L (ref 39–117)
Bilirubin, Direct: 0.1 mg/dL (ref 0.0–0.3)
Total Bilirubin: 0.6 mg/dL (ref 0.3–1.2)
Total Protein: 6.5 g/dL (ref 6.0–8.3)

## 2010-05-21 LAB — BASIC METABOLIC PANEL
BUN: 18 mg/dL (ref 6–23)
Chloride: 106 mEq/L (ref 96–112)
Creatinine, Ser: 1 mg/dL (ref 0.4–1.5)
Glucose, Bld: 101 mg/dL — ABNORMAL HIGH (ref 70–99)

## 2010-05-21 LAB — CBC WITH DIFFERENTIAL/PLATELET
Basophils Absolute: 0 10*3/uL (ref 0.0–0.1)
Eosinophils Absolute: 0.2 10*3/uL (ref 0.0–0.7)
Eosinophils Relative: 3.3 % (ref 0.0–5.0)
HCT: 44.1 % (ref 39.0–52.0)
Lymphs Abs: 1.5 10*3/uL (ref 0.7–4.0)
MCHC: 34.2 g/dL (ref 30.0–36.0)
MCV: 90.7 fl (ref 78.0–100.0)
Monocytes Absolute: 0.6 10*3/uL (ref 0.1–1.0)
Neutrophils Relative %: 66.4 % (ref 43.0–77.0)
Platelets: 240 10*3/uL (ref 150.0–400.0)
RDW: 14 % (ref 11.5–14.6)
WBC: 6.9 10*3/uL (ref 4.5–10.5)

## 2010-05-21 LAB — LIPID PANEL: VLDL: 49.8 mg/dL — ABNORMAL HIGH (ref 0.0–40.0)

## 2010-05-21 LAB — TSH: TSH: 1.74 u[IU]/mL (ref 0.35–5.50)

## 2010-05-21 LAB — LDL CHOLESTEROL, DIRECT: Direct LDL: 138.9 mg/dL

## 2010-05-21 LAB — PSA: PSA: 1.63 ng/mL (ref 0.10–4.00)

## 2010-05-21 LAB — TESTOSTERONE: Testosterone: 552.79 ng/dL (ref 350.00–890.00)

## 2010-05-26 NOTE — Progress Notes (Signed)
Summary: speak to nurse  Phone Note Call from Patient Call back at [phone] cell   Caller: Patient Call For: nadel Reason for Call: Talk to Nurse Summary of Call: Patient calling requesting to speak to Leigh, when  asking what it was regarding he said it concerned several things and he would rather explain to Leigh.  Told patient I would get message to nurse to call back. Initial call taken by: Lehman Prom,  May 19, 2010 3:22 PM  Follow-up for Phone Call        Spoke with pt.  He is wanting to come in and have labs done on 2/23 am b/c has ov coming up next wk.  He wants to be sure that SN orders testosterone and PSA level along with other labs.  Also he states that he "caught bronchitis" a few wks ago- took round of amoxcicillin and states getting much better and "chest has calmed down", but still c/o "felling fluish" and would like whatever pills SN gave him for flu before.  He states that he feels a little tired and achy.  I advised tylenol and rest, but he is insisting on getting "the flu pill" and states that he can not take tylenol b/c there is already 500 mg of this is his pain med that he takes two times a day. Pls advise thanks allergic to ceftin and singulair Follow-up by: Vernie Murders,  May 19, 2010 3:46 PM  Additional Follow-up for Phone Call Additional follow up Details #1::        per SN---flu is a virus no med really to take ---SN usually rx with fluids and tylenol---pt will need to tell SN what the name of the med is that he is wanting.    lab wise he can come in for these labs---lip,hepat,bmp,cbcd, tsh,testosterone, psa--v70.0.Marland KitchenMarland Kitchenthanks Randell Loop CMA  May 19, 2010 4:51 PM     Additional Follow-up for Phone Call Additional follow up Details #2::    Spoke with pt and notified of the above per SN and pt verbalized understanding.  Pt verbalized understanding.  Lab appt sched in Epic.  Follow-up by: Vernie Murders,  May 19, 2010 5:07 PM

## 2010-05-27 ENCOUNTER — Encounter: Payer: Self-pay | Admitting: Pulmonary Disease

## 2010-05-27 ENCOUNTER — Ambulatory Visit (INDEPENDENT_AMBULATORY_CARE_PROVIDER_SITE_OTHER): Payer: BC Managed Care – PPO | Admitting: Pulmonary Disease

## 2010-05-27 DIAGNOSIS — K219 Gastro-esophageal reflux disease without esophagitis: Secondary | ICD-10-CM

## 2010-05-27 DIAGNOSIS — R0602 Shortness of breath: Secondary | ICD-10-CM

## 2010-05-27 DIAGNOSIS — E785 Hyperlipidemia, unspecified: Secondary | ICD-10-CM

## 2010-05-27 DIAGNOSIS — I10 Essential (primary) hypertension: Secondary | ICD-10-CM

## 2010-05-27 DIAGNOSIS — R05 Cough: Secondary | ICD-10-CM

## 2010-05-27 DIAGNOSIS — G4733 Obstructive sleep apnea (adult) (pediatric): Secondary | ICD-10-CM

## 2010-05-27 DIAGNOSIS — R079 Chest pain, unspecified: Secondary | ICD-10-CM

## 2010-05-27 DIAGNOSIS — E669 Obesity, unspecified: Secondary | ICD-10-CM

## 2010-05-28 ENCOUNTER — Telehealth: Payer: Self-pay | Admitting: Pulmonary Disease

## 2010-06-04 NOTE — Progress Notes (Signed)
Summary: returning [facility] call  Phone Note Call from Patient   Caller: Patient Call For: Lowery Paullin Summary of Call: Patient phoned stated that he was returning [facility] call he can be reached at [phone] Initial call taken by: Vedia Coffer,  May 28, 2010 10:22 AM  Follow-up for Phone Call        returned call to pt---he is requesting for methocarbamol 750mg   #120  1 by mouth four times daily with refills and clotrimazole cream #45gm  apply as directed with 3 refills---wants these called to the pharmacy for refills of these---last filled in 2007.  please advise.  thanks Randell Loop CMA  May 28, 2010 11:18 AM   Additional Follow-up for Phone Call Additional follow up Details #1::        per SN---ok for refills of these meds and these meds have been sent into the pharmacy---pt is aware---had to call [facility] to get his nexium fixed with [facility]----called and spoke with the pharmacy and they ran this back through and are able to fill it now Randell Loop CMA  May 28, 2010 12:28 PM     New/Updated Medications: METHOCARBAMOL 750 MG TABS (METHOCARBAMOL) take one tablet by mouth four times daily as needed CLOTRIMAZOLE 1 % CREA (CLOTRIMAZOLE) apply as directed to rash Prescriptions: CLOTRIMAZOLE 1 % CREA (CLOTRIMAZOLE) apply as directed to rash  #45 gm x 5   Entered by:   Randell Loop CMA   Authorized by:   Michele Mcalpine MD   Signed by:   Randell Loop CMA on 05/28/2010   Method used:   Electronically to        [id number]* (retail)       [address]       [place], [place]  [address]       Ph: [phone] or [phone]       Fax: [phone]   RxID:   [id number] METHOCARBAMOL 750 MG TABS (METHOCARBAMOL) take one tablet by mouth four times daily as needed  #120 x 5   Entered by:   Randell Loop CMA   Authorized by:   Michele Mcalpine MD   Signed by:   Randell Loop CMA on 05/28/2010   Method used:   Electronically to        [facility]  # [id number]* (retail)       [address]       [place], [place]  [address]       Ph: [phone] or [phone]       Fax: [phone]   RxID:   [id number]

## 2010-06-09 NOTE — Assessment & Plan Note (Signed)
Summary: Ronald King   Primary Care Provider:  Lorin Picket Pasha Broad,MD  CC:  Yearly ROV & review of mult medical problems...  History of Present Illness: 65 y/o WM here for a follow up visit... he has a chronic cough x years and is dependent on codeine containing cough syrups... he was miserable when ENDAL HD went off the market and we had to substitute PHENERGAN EXPECTORANT w/ CODEINE - 1 tsp by mouth Q6H as needed for cough, limiting him to 1 pint per month...    ~  April 17, 2008:  he notes that he's been more SOB recently and went to [facility] for eval w/ neb Rx given + Avelox for bronchitis & improved... we discussed Asthmatic Bronchitis and decided to Rx w/ Symbicort 80- 2spBid... he wants to be sure we do full labs w/ his physical- including VitD level...   ~  May 21, 2009:  Yearly ROV w/ coincident URI- cough, min sputum, no color or blood, temp to 101 as noted> we discussed checking CXR/ labs- Rx w/ ZPak, Advair, Mucinex, Fluids... he has mult somatic complaints- stopped the Symbicort stating that it gave him HAs...   ~  May 27, 2010:  Yearly check up> feeling some better since starting on Androgel 5gm/d for Low-T & Testos level has improved from 204 to 553... he continues on nasal regimen for sinuses but c/o tinnitus and decr hearing- he is rec to see ENT for further eval & we will refer;  continues on phen expec w/ codeine for his chr cough;  BP controlled on his 3 med regimen;  Lipids still look poor on Fibrate alone & he still refuses Statin & Lipid Clinic eval "I can do it on diet alone";  he takes a number of vits & supplements... he sees Stage manager for Paxil & Klonopin Rx; and ?[facility]..    Current Problem List:  Hx of SINUSITIS (ICD-473.9) - on NASONEX 2spBid + he requests Rx for AMOXICILLIN to keep on hand for travel... prev eval DrByers- "he wanted to do surg"... "my sinuses are crazy" but he refuses to consider the surg... also had dysphonia eval at [facility]  in the 90's w/ Botox tried.  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 saw DrClance 12/09- he felt upper airway resistance syndrome... doing better w/ new CPAP machine in 2010.  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 he had an extensive eval in the past w/ GI, ENT, Pulm, etc... he uses PHEN EXPECT w/ CODEINE 1 tsp Q6H up to 1 pint/mo...  ~  CXR 1/10 showed clear,  just min biapical pleural thickening...  ~  2/11: states intol to Symbicort w/ HAs... try ADVAIR 250Bid...  ~  CXR 2/11 showed clear lungs, NAD...  HYPERTENSION (ICD-401.9) - meds adjusted by DrCrenshaw on NORVASC 10mg /d, LISINOPRIL 20mg /d, & HCTZ 25mg - 1/2 tab daily... BP= 120/74... denies HA, fatigue, visual changes, CP, palipit, dizziness, syncope, dyspnea, edema, etc...  DYSLIPIDEMIA (ICD-272.4) - hx elevated TG & unable to control on diet alone... now on TRICOR 145mg /d but refuses Statin meds or Lipid Clinic referral...  ~  FLP 1/10 showed TChol 161, TG 239, HDL 30, LDL 72... he wants to control w/ diet.  ~  FLP 9/10 showed TChol 192, TG 426, HDL 30, LDL 80... rec> start [id number].  ~  FLP 2/11 showed TChol 173, TG 102, HDL 37, LDL 115... continue Tricor Rx + diet.  OBESITY (ICD-278.00) - weight = 267#,  6\' 2"  tall = BMI of  34-35... discussed diet + exercise and the need for weight reduction both from his overall health status and his OSA.Marland Kitchen.  ~  weight 1/10 = 267#  ~  weight 2/11 = 272#... discussed again!  GERD (ICD-530.81) - on NEXIUM 40mg Bid & REGLAN 10mg  Qhs... s/p lap nissen 3/01 by DrMartin....  ~  EGD 11/05 showed prev Nissen, +gastritis...  ~  EGD 6/10 by DrDBrodie showed functioning Nissen, post-op changes, sessile polyp in distal esoph= benign mucosa.  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  ~  f/u colonoscopy 6/10 by DrDBrodie showed negative x for int hems...  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 ? didn't tol the hormone medication...   ~  labs 1/10 showed PSA= 1.63  ~  labs 2/11 showed PSA= 1.65  DEGENERATIVE JOINT DISEASE (ICD-715.90) - he had left knee arthroscopy in 2007 by DrDean to remove cartilage...  VITAMIN D DEFICIENCY (ICD-268.9) - on Vit D OTC 2000 u daily...  ~  labs 1/10 showed Vit D level = 16... Vit D 2000 u daily started.  ~  labs 2/11 showed Vit D level = 34... continue supplement.  HEADACHE (ICD-784.0) - Chr daily migraines in the past w/ prev HA eval by DrFreeman (Botox shots helped in the past)...  NEUROPATHY (ICD-355.9) - c/o burning in legs and eval from Neuro in [facility] & Rx w/ Neurontin + Hydrocodone for pain... this may be part of a familial trait as several relatives have severe periph neuropathy (?hereditary syndrome)- we don't have notes from [facility] neurology.  ANXIETY (ICD-300.00) - on PAXIL 10mg /d & KLONOPIN 1mg  as needed... they were prev perscribed by ?psychiatrist? but he notes "I've been on these for >27yrs"... he still sees DrStevens, but we fill his perscriptions.  Health Maintenance - he takes ASA 81mg /d, Vits + Vit C... he is up to date on all vacc[facility]...   Preventive Screening-Counseling & Management  Alcohol-Tobacco     Smoking Status: quit     Year Quit: age 57     Pack years: one ppd for 2 years  Allergies: 1)  Ceftin (Cefuroxime Axetil) 2)  Singulair (Montelukast Sodium)  Past History:  Past Medical History: DYSLIPIDEMIA  HYPERTENSION  POLYPS, ESOPHAGEAL ANEMIA, HX OF GASTROINTESTINAL HEMORRHAGE, HX OF  OBSTRUCTIVE SLEEP APNEA Chronic bronchitis and cough GERD  HEMORRHOIDS, INTERNAL DEGENERATIVE JOINT DISEASE  VITAMIN D DEFICIENCY NEUROPATHY  Past Surgical History: S/P lap nissen fundoplication by Dr Daphine Deutscher 3/01 S/P left knee arthroscopy in 2007 by Dr August Saucer Tonsillectomy Nasal Surgery Anal fissure Left hand fracture/surgery  Family History: Reviewed history from  02/04/2010 and no changes required. Father died age 60 w/ cerebral aneurysm, hx alcoholism Mother alive age 88 in nursing home w/ severe neuropathy 3 Siblings- all brothers w/ AFib, migraines... Family History of Colitis/Crohn's: Daughter ? crohns Family History of Diabetes: Maternal Uncle Brother with CABG at age 26  Social History: Reviewed history from 02/04/2010 and no changes required. Married- 43 yrs 1 Daughter- ?hx Crohn's disease, stress... 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  The patient denies anorexia, fever, weight loss, weight gain, vision loss, decreased hearing, hoarseness, chest pain, syncope, peripheral edema,  prolonged cough, headaches, hemoptysis, abdominal pain, melena, hematochezia, severe indigestion/heartburn, hematuria, incontinence, muscle weakness, suspicious skin lesions, transient blindness, difficulty walking, depression, unusual weight change, abnormal bleeding, enlarged lymph nodes, and angioedema.    Vital Signs:  Patient profile:   65 year old male Height:      74 inches Weight:      276.13 pounds BMI:     35.58 O2 Sat:      97 % on Room air Temp:     97.5 degrees F oral Pulse rate:   76 / minute BP sitting:   120 / 74  (left arm) Cuff size:   regular  Vitals Entered By: Randell Loop CMA (May 27, 2010 12:11 PM)  O2 Sat at Rest %:  97 O2 Flow:  Room air CC: Yearly ROV & review of mult medical problems.. Comments no changes in meds today   Physical Exam  Additional Exam:  WD, Obese, 65 y/o WM in NAD... GENERAL:  Alert & oriented; pleasant & cooperative... HEENT:  [place]/AT, EOM-wnl, PERRLA, EACs-clear, TMs-wnl, NOSE-clear, THROAT-clear & wnl. NECK:  Supple w/ fairROM; no JVD; normal carotid impulses w/o bruits; no thyromegaly or nodules palpated;  no lymphadenopathy. CHEST:  Clear to P & A; without wheezes/ rales/ or rhonchi; he has an intermittent dry irritative cough... HEART:  Regular Rhythm; without murmurs/ rubs/ or gallops. ABDOMEN:  Obese, soft & nontender; normal bowel sounds; no organomegaly or masses detected.  EXT: without deformities, mild arthritic changes; no varicose veins/ venous insuffic/ or edema. NEURO:  CN's intact; motor testing normal; no focal neuro deficits DERM:  No lesions noted; no rash etc...    Impression & Recommendations:  Problem # 1:  Hx of SINUSITIS (ICD-473.9) He has persistant upper airway symptoms & is c/o tinnitus and decr hearing for which we will referhim to ENt for eval... His updated medication list for this problem includes:    Nasonex 50 Mcg/act Susp (Mometasone furoate) .Marland Kitchen... 2 sprays in each nostril two times a day...    Promethazine-codeine 6.25-10 Mg/73ml Syrp (Promethazine-codeine) .Marland Kitchen... Take 1 tsp by mouth q 6 h as needed for severe cough... not to exceed 1 pint per month.    Amoxicillin-pot Clavulanate 875-125 Mg Tabs (Amoxicillin-pot clavulanate) .Marland Kitchen... Take 1 tab by mouth two times a day as directed for infection...  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 refill Phen expec w/ codeine & he is limited to 1 pt per month...  Problem # 4:  HYPERTENSION (ICD-401.9) BP controlled on 3 meds now... continue same... His updated medication list for this problem includes:    Norvasc 10 Mg Tabs (Amlodipine besylate) .Marland Kitchen... Take 1 tablet by mouth once a day    Hydrochlorothiazide 25 Mg Tabs (Hydrochlorothiazide) .Marland Kitchen... 1/2 tab once daily    Lisinopril 20 Mg Tabs (Lisinopril) .Marland Kitchen... Take one tablet by mouth daily  Problem # 5:  DYSLIPIDEMIA (ICD-272.4) He adamantly refuses statin meds or Lipid clinic referral... he will continue thr Tricor & work on diet & wt reduction. His updated  medication list for this problem includes:    Tricor 145 Mg Tabs (Fenofibrate) .Marland Kitchen... Take 1 tablet by mouth once a day  Problem # 6:  OBESITY (ICD-278.00) Wt is up to 276# & we reviewed diet & exercise perscriptions for wt reduction...  Problem # 7:  GERD (ICD-530.81) GI is stable, s/p Nissen, followed by DrDBrodie... colon is  up to date as well.Marland Kitchen His updated medication list for this problem includes:    Nexium 40 Mg Cpdr (Esomeprazole magnesium) .Marland Kitchen... 1 capsule twice a day 30 minutes before meals  Problem # 8:  OTHER MEDICAL PROBLEMS AS NOTED>>>  Complete Medication List: 1)  Nasonex 50 Mcg/act Susp (Mometasone furoate) .... 2 sprays in each nostril two times a day... 2)  Promethazine-codeine 6.25-10 Mg/42ml Syrp (Promethazine-codeine) .... Take 1 tsp by mouth q 6 h as needed for severe cough... not to exceed 1 pint per month. 3)  Aspirin Adult Low Strength 81 Mg Tbec (Aspirin) .... Take 1 tablet by mouth once a day 4)  Norvasc 10 Mg Tabs (Amlodipine besylate) .... Take 1 tablet by mouth once a day 5)  Tricor 145 Mg Tabs (Fenofibrate) .... Take 1 tablet by mouth once a day 6)  Nexium 40 Mg Cpdr (Esomeprazole magnesium) .Marland Kitchen.. 1 capsule twice a day 30 minutes before meals 7)  Centrum Silver Tabs (Multiple vitamins-minerals) .... Take 1 tablet by mouth once a day 8)  Vitamin D 2000 Unit Tabs (Cholecalciferol) .... Take 1 tablet by mouth once a day 9)  Paxil Cr 25 Mg Xr24h-tab (Paroxetine hcl) .... Take 1 tablet by mouth once a day 10)  Klonopin 1 Mg Tabs (Clonazepam) .... Take 1 tablet by mouth two times a day as needed for nerves... 11)  Hydrocodone-acetaminophen 2.5-500 Mg Tabs (Hydrocodone-acetaminophen) .... As needed 12)  Zicam Cold Remedy Liqd (Homeopathic products) .... As needed 13)  Ra Calcium Plus Vitamin D 600-400 Mg-unit Tabs (Calcium carbonate-vitamin d) .... Take 1 tablet by mouth once a day 14)  Vitamin C Cr 500 Mg Cr-caps (Ascorbic acid) .... Take 1 capsule by mouth once a  day 15)  Vitamin B-6 Cr 200 Mg Cr-tabs (Pyridoxine hcl) .... Take 1 tablet by mouth once a day 16)  Vitamin B-12 2500 Mcg Subl (Cyanocobalamin) .... Take 1 tablet by mouth once a day 17)  Amoxicillin-pot Clavulanate 875-125 Mg Tabs (Amoxicillin-pot clavulanate) .... Take 1 tab by mouth two times a day as directed for infection... 18)  Hydrochlorothiazide 25 Mg Tabs (Hydrochlorothiazide) .... 1/2 tab once daily 19)  Lisinopril 20 Mg Tabs (Lisinopril) .... Take one tablet by mouth daily 20)  Androgel 50 Mg/5gm Gel (Testosterone) .... Apply one packet to skin daily 21)  Methocarbamol 750 Mg Tabs (Methocarbamol) .... Take one tablet by mouth four times daily as needed 22)  Clotrimazole 1 % Crea (Clotrimazole) .... Apply as directed to rash  Patient Instructions: 1)  Today we updated your med list- see below.... 2)  Continue your current meds the same... 3)  We refilled your meds per request... 4)  We gave you a copy of your recent lab work> you need to get on track w/ your low carb low fat diet & lose some weight!!! 5)  Call for any questions.Marland KitchenMarland Kitchen 6)  Please schedule a follow-up appointment in 6 months, w/ repeat lab work... Prescriptions: AMOXICILLIN-POT CLAVULANATE 875-125 MG TABS (AMOXICILLIN-POT CLAVULANATE) take 1 tab by mouth two times a day as directed for infection...  #20 x 2   Entered and Authorized by:   Michele Mcalpine MD   Signed by:   Michele Mcalpine MD on 05/27/2010   Method used:   Print then Give to Patient   RxID:   [id number] NEXIUM 40 MG  CPDR (ESOMEPRAZOLE MAGNESIUM) 1 capsule twice a day 30 minutes before meals  #180 x 4   Entered and Authorized by:   Lorin Picket  Elayne Snare MD   Signed by:   Michele Mcalpine MD on 05/27/2010   Method used:   Print then Give to Patient   RxID:   [id number] TRICOR 145 MG TABS (FENOFIBRATE) Take 1 tablet by mouth once a day  #90 x 4   Entered and Authorized by:   Michele Mcalpine MD   Signed by:   Michele Mcalpine MD on 05/27/2010   Method used:    Print then Give to Patient   RxID:   [id number] NORVASC 10 MG  TABS (AMLODIPINE BESYLATE) Take 1 tablet by mouth once a day  #90 x 4   Entered and Authorized by:   Michele Mcalpine MD   Signed by:   Michele Mcalpine MD on 05/27/2010   Method used:   Print then Give to Patient   RxID:   [id number] PROMETHAZINE-CODEINE 6.25-10 MG/5ML  SYRP (PROMETHAZINE-CODEINE) take 1 tsp by mouth Q 6 H as needed for severe cough... not to exceed 1 pint per month.  #1 pint x 12   Entered and Authorized by:   Michele Mcalpine MD   Signed by:   Michele Mcalpine MD on 05/27/2010   Method used:   Print then Give to Patient   RxID:   [id number] NASONEX 50 MCG/ACT SUSP (MOMETASONE FUROATE) 2 sprays in each nostril two times a day...  #3 x 4   Entered and Authorized by:   Michele Mcalpine MD   Signed by:   Michele Mcalpine MD on 05/27/2010   Method used:   Print then Give to Patient   RxID:   [id number]

## 2010-09-24 ENCOUNTER — Other Ambulatory Visit: Payer: Self-pay | Admitting: Pulmonary Disease

## 2010-09-25 ENCOUNTER — Other Ambulatory Visit: Payer: Self-pay | Admitting: Pulmonary Disease

## 2010-09-25 MED ORDER — TESTOSTERONE 50 MG/5GM (1%) TD GEL: 5.0000 g | Freq: Every day | TRANSDERMAL | Status: DC

## 2010-09-25 NOTE — Telephone Encounter (Signed)
Called, spoke with pt.  He is requesting rx for androgel.  Rx called in -- pt aware.

## 2010-11-26 ENCOUNTER — Ambulatory Visit (INDEPENDENT_AMBULATORY_CARE_PROVIDER_SITE_OTHER)
Admission: RE | Admit: 2010-11-26 | Discharge: 2010-11-26 | Disposition: A | Discharge status: Home / Self Care | Payer: BC Managed Care – PPO | Source: Ambulatory Visit | Attending: Pulmonary Disease | Admitting: Pulmonary Disease

## 2010-11-26 ENCOUNTER — Other Ambulatory Visit (INDEPENDENT_AMBULATORY_CARE_PROVIDER_SITE_OTHER): Payer: BC Managed Care – PPO

## 2010-11-26 ENCOUNTER — Telehealth: Payer: Self-pay | Admitting: Pulmonary Disease

## 2010-11-26 ENCOUNTER — Ambulatory Visit (INDEPENDENT_AMBULATORY_CARE_PROVIDER_SITE_OTHER): Payer: BC Managed Care – PPO | Admitting: Pulmonary Disease

## 2010-11-26 ENCOUNTER — Encounter: Payer: Self-pay | Admitting: Pulmonary Disease

## 2010-11-26 VITALS — BP 134/82 | HR 73 | Temp 98.2°F | Ht 74.0 in | Wt 273.6 lb

## 2010-11-26 DIAGNOSIS — E559 Vitamin D deficiency, unspecified: Secondary | ICD-10-CM

## 2010-11-26 DIAGNOSIS — G4733 Obstructive sleep apnea (adult) (pediatric): Secondary | ICD-10-CM

## 2010-11-26 DIAGNOSIS — J209 Acute bronchitis, unspecified: Secondary | ICD-10-CM

## 2010-11-26 DIAGNOSIS — E785 Hyperlipidemia, unspecified: Secondary | ICD-10-CM

## 2010-11-26 DIAGNOSIS — E291 Testicular hypofunction: Secondary | ICD-10-CM

## 2010-11-26 DIAGNOSIS — G589 Mononeuropathy, unspecified: Secondary | ICD-10-CM

## 2010-11-26 DIAGNOSIS — F411 Generalized anxiety disorder: Secondary | ICD-10-CM

## 2010-11-26 DIAGNOSIS — E669 Obesity, unspecified: Secondary | ICD-10-CM

## 2010-11-26 DIAGNOSIS — K219 Gastro-esophageal reflux disease without esophagitis: Secondary | ICD-10-CM

## 2010-11-26 DIAGNOSIS — R05 Cough: Secondary | ICD-10-CM

## 2010-11-26 DIAGNOSIS — K589 Irritable bowel syndrome without diarrhea: Secondary | ICD-10-CM

## 2010-11-26 DIAGNOSIS — M1990 Unspecified osteoarthritis, unspecified site: Secondary | ICD-10-CM

## 2010-11-26 DIAGNOSIS — I10 Essential (primary) hypertension: Secondary | ICD-10-CM

## 2010-11-26 LAB — LIPID PANEL
Cholesterol: 194 mg/dL (ref 0–200)
LDL Cholesterol: 128 mg/dL — ABNORMAL HIGH (ref 0–99)
Triglycerides: 169 mg/dL — ABNORMAL HIGH (ref 0.0–149.0)

## 2010-11-26 NOTE — Telephone Encounter (Signed)
Pt states almost out of his phenergan with codeine cough med. Is requesting new rx for this with PRN refills as he uses this daily. Please advise thanks!

## 2010-11-26 NOTE — Progress Notes (Signed)
Subjective:    Patient ID: Ronald King, male    DOB: 1945/12/03, 65 y.o.   MRN: [id number]  HPI 65 y/o WM here for a follow up visit... he has a chronic cough x years and is dependent on codeine containing cough syrups... he was miserable when ENDAL HD went off the market and we had to substitute PHENERGAN EXPECTORANT w/ CODEINE - 1 tsp by mouth Q6H as needed for cough, limiting him to 1 pint per month...   ~  May 21, 2009:  Yearly ROV w/ coincident URI- cough, min sputum, no color or blood, temp to 101 as noted> we discussed checking CXR/ labs- Rx w/ ZPak, Advair, Mucinex, Fluids... he has mult somatic complaints- stopped the Symbicort stating that it gave him HAs...  ~  May 27, 2010:  Yearly check up> feeling some better since starting on Androgel 5gm/d for Low-T & Testos level has improved from 204 to 553... he continues on nasal regimen for sinuses but c/o tinnitus and decr hearing- he is rec to see ENT for further eval & we will refer;  continues on phen expec w/ codeine for his chr cough;  BP controlled on his 3 med regimen;  Lipids still look poor on Fibrate alone & he still refuses Statin & Lipid Clinic eval "I can do it on diet alone";  he takes a number of vits & supplements... he sees Stage manager for Paxil & Klonopin Rx; and ?DrGibson [place] [facility]..  ~  November 26, 2010:  36mo ROV & he reports recent bronchitic episode improved after Augmentin rx & c/o fatigue- doesn't have the energy he feels he should have; he wants chol, vit d & testosterone levels checked; due for CXR as well...  see prob list below >>    He reports seeing his Neurologist, DrGibson in [place] for neuropathy in his feet (feet worse numb all the time now) & they have tried several meds- Neurontin, Lyrica, one other (INTOL to these due to 'memory gaps' he says), & pt refuses Cymbalta> asked to keep me informed of his progress & try to get Neuro notes sent to me for our records.          Problem  List:  Hx of SINUSITIS (ICD-473.9) - on NASONEX 2spBid + he requests Rx for AMOXICILLIN to keep on hand for travel... prev eval DrByers- "he wanted to do surg"... "my sinuses are crazy" but he refuses to consider the surg... also had dysphonia eval at [facility] in the 90's w/ Botox tried.  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 saw DrClance 12/09- he felt upper airway resistance syndrome... doing better w/ new CPAP machine in 2010.  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 he had an extensive eval in the past w/ GI, ENT, Pulm, etc... he uses PHEN EXPECT w/ CODEINE 1 tsp Q6H up to 1 pint/mo... ~  CXR 1/10 showed clear,  just min biapical pleural thickening... ~  2/11: states intol to Symbicort w/ HAs... try ADVAIR 250Bid... ~  CXR 2/11 showed clear lungs, NAD.Marland Kitchen. ~  CXR 8/12 showed chr bronchitic changes, NAD...  HYPERTENSION (ICD-401.9) - meds adjusted by DrCrenshaw on NORVASC 10mg /d, LISINOPRIL 20mg /d, & HCTZ 25mg - 1/2 tab daily... BP= 134/82... denies HA, fatigue, visual changes, CP, palipit, dizziness, syncope, dyspnea, edema, etc...  DYSLIPIDEMIA (ICD-272.4) - hx elevated TG & unable to control on diet alone... now on TRICOR 145mg /d but refuses Statin meds or Lipid Clinic referral... ~  FLP 1/10 showed TChol  161, TG 239, HDL 30, LDL 72... he wants to control w/ diet. ~  FLP 9/10 showed TChol 192, TG 426, HDL 30, LDL 80... rec> start [id number]. ~  FLP 2/11 showed TChol 173, TG 102, HDL 37, LDL 115... continue Tricor Rx + diet. ~  FLP 2/12 showed TChol 221, TG 249, HDL 31, LDL 140... Reminded to take med regularly & get wt down. ~  FLP 8/12 showed TChol 194, TG 169, HDL 32, LDL 128... Still not at goal, refuses statin med.  OBESITY (ICD-278.00) - weight = 274#,  6\' 2"  tall = BMI of 35... discussed diet + exercise and the need for weight reduction both from his overall health status and his OSA... "I moderate what I eat". ~  weight 1/10 = 267# ~  weight 2/11 = 272#...  discussed again! ~  Weight 2/12 = 276#... He is unconcerned! ~  Weight 8/12 = 274#... Reviewed diet & exercise needed.  GERD (ICD-530.81) - on NEXIUM 40mg Bid & REGLAN 10mg  Qhs... s/p lap nissen 3/01 by DrMartin.... ~  EGD 11/05 showed prev Nissen, +gastritis... ~  EGD 6/10 by DrDBrodie showed functioning Nissen, post-op changes, sessile polyp in distal esoph= benign mucosa.  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 ~  f/u colonoscopy 6/10 by DrDBrodie showed negative x for int hems...  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  Finally started ANDROGEL 5gm packet Qd in [address] ~  labs 1/10 showed PSA= 1.63 ~  labs 2/11 showed PSA= 1.65,  ~  Labs 1/12 showed Testosterone level = 160-205 ~  Labs 2/12 showed PSA = 1.63, Testosterone level = 553  DEGENERATIVE JOINT DISEASE (ICD-715.90) - he had left knee arthroscopy in 2007 by DrDean to remove cartilage...  VITAMIN D DEFICIENCY (ICD-268.9) - on Vit D OTC 2000 u daily... ~  labs 1/10 showed Vit D level = 16... Vit D 2000 u daily started. ~  labs 2/11 showed Vit D level = 34... continue supplement. ~  Labs 8/12 showed Vit D level = 47... continue same.  HEADACHE (ICD-784.0) - Chr daily migraines in the past w/ prev HA eval by DrFreeman (Botox shots helped in the past)...  NEUROPATHY (ICD-355.9) - c/o burning in legs and eval from Neuro in [facility] & Rx w/ Neurontin + Hydrocodone for pain... this may be part of a familial trait as several relatives have severe periph neuropathy (?hereditary syndrome)- we don't have notes from [facility] neurology. ~  Pt reports that he was intol to Neurontin, Lyrica, & one other ("memory gaps") & he refused Cymbalta trial...  ANXIETY (ICD-300.00) - on PAXIL & KLONOPIN 1mg  as needed... they were prev perscribed by ?psychiatrist? but he notes "I've been on these for >25yrs"... he still sees DrStevens, but we fill  his perscriptions.  Health Maintenance - he takes ASA 81mg /d, & many Vits & supplements (states they help his RLS & leg cramps)... he is [facility] to date on [facility]...   Past Surgical History  Procedure Date  . Laparoscopic nissen fundoplication 05/1999    Dr. Daphine Deutscher  . Knee arthroscopy 2007    Dr. August Saucer  . Tonsillectomy   . Nose surgery   . Anal fissure repair   . Hand fracture surgery     left    Outpatient Encounter Prescriptions as of 11/26/2010  Medication Sig Dispense Refill  . amLODipine (NORVASC) 10 MG tablet  Take 10 mg by mouth daily.        . ANDROGEL 50 MG/5GM GEL apply 1 packet as directed once daily  30 g  5  . ascorbic acid (VITAMIN C) 500 MG tablet Take 500 mg by mouth daily.        Marland Kitchen aspirin 81 MG tablet Take 81 mg by mouth daily.        . Calcium Carbonate-Vitamin D (RA CALCIUM PLUS VITAMIN D) 600-400 MG-UNIT per tablet Take 1 tablet by mouth daily.        . Cholecalciferol (VITAMIN D) 2000 UNITS tablet Take 2,000 Units by mouth daily.        . clonazePAM (KLONOPIN) 1 MG tablet Take 1 mg by mouth 2 (two) times daily as needed.        . clotrimazole (LOTRIMIN) 1 % cream Apply topically 2 (two) times daily.        . Cyanocobalamin (VITAMIN B-12) 2500 MCG SUBL Place 1 tablet under the tongue daily.        Marland Kitchen esomeprazole (NEXIUM) 40 MG capsule Take 40 mg by mouth 2 (two) times daily.        . fenofibrate (TRICOR) 145 MG tablet Take 145 mg by mouth daily.        . Homeopathic Products (ZICAM COLD REMEDY) LIQD Take by mouth as needed.        . hydrochlorothiazide 25 MG tablet Take 12.5 mg by mouth daily.        Marland Kitchen HYDROcodone-acetaminophen (VICODIN) 2.5-500 MG per tablet Take 1 tablet by mouth every 6 (six) hours as needed.        Marland Kitchen lisinopril (PRINIVIL,ZESTRIL) 20 MG tablet Take 20 mg by mouth daily.        . methocarbamol (ROBAXIN) 750 MG tablet Take 750 mg by mouth 4 (four) times daily.        . mometasone (NASONEX) 50 MCG/ACT nasal  spray Place 2 sprays into the nose 2 (two) times daily.        . Multiple Vitamins-Minerals (CENTRUM SILVER PO) Take 1 tablet by mouth daily.        Marland Kitchen PARoxetine (PAXIL-CR) 25 MG 24 hr tablet Take 25 mg by mouth every morning.        . promethazine-codeine (PHENERGAN WITH CODEINE) 6.25-10 MG/5ML syrup Take 5 mLs by mouth every 6 (six) hours as needed.        . Pyridoxine HCl (VITAMIN B-6 CR) 200 MG TBCR Take 1 tablet by mouth daily.        Marland Kitchen testosterone (ANDROGEL) 50 MG/5GM GEL Place 5 g onto the skin daily. Apply 1 packet to skin daily  30 Package  5    Allergies  Allergen Reactions  . Cefuroxime Axetil     REACTION: unsure of reaction  . Montelukast Sodium     REACTION: unsure of reaction    Current Medications, Allergies, Past Medical History, Past Surgical History, Family History, and Social History were reviewed in [employer] record.   Review of Systems        See HPI - all other systems neg except as noted...  The patient complains of dyspnea on exertion.  The patient denies anorexia, fever, weight loss, weight gain, vision loss, decreased hearing, hoarseness, chest pain, syncope, peripheral edema, prolonged cough, headaches, hemoptysis, abdominal pain, melena, hematochezia, severe indigestion/heartburn, hematuria, incontinence, muscle weakness, suspicious skin lesions, transient blindness, difficulty walking, depression, unusual weight change, abnormal bleeding, enlarged lymph nodes, and angioedema.  Objective:   Physical Exam      WD, Obese, 65 y/o WM in NAD... GENERAL:  Alert & oriented; pleasant & cooperative... HEENT:  [place]/AT, EOM-wnl, PERRLA, EACs-clear, TMs-wnl, NOSE-clear, THROAT-clear & wnl. NECK:  Supple w/ fairROM; no JVD; normal carotid impulses w/o bruits; no thyromegaly or nodules palpated; no lymphadenopathy. CHEST:  Clear to P & A; without wheezes/ rales/ or rhonchi; he has an intermittent dry irritative cough... HEART:  Regular  Rhythm; without murmurs/ rubs/ or gallops. ABDOMEN:  Obese, soft & nontender; normal bowel sounds; no organomegaly or masses detected. EXT: without deformities, mild arthritic changes; no varicose veins/ venous insuffic/ or edema. NEURO:  CN's intact; motor testing normal; no focal neuro deficits DERM:  No lesions noted; no rash etc...   Assessment & Plan:   OSA>  He uses CPAP nightly, continue same...  Chronic Cough>  He is quite dependent on the Phen expect w/ codeine, limited to one pint per month...  HBP>  Controlled on CCB, ACE, Diuretic; continue same; he understands that substantial wt reduction would help w/ BP control & meds.  DYSLIPIDEMIA>  On Tricor + diet & not at goal; he is unconcerned & refuses statin or other med addition to regimen; explained that substantial wt reduction is his only other option & he understands this...  Obesity>  We reviewed diet & exercise...  GI> GERD, IBS, Hems>  Stable on Nexium, etc...  GU> BPH, ED, Hypogonadism>  Prev eval by drPeterson, on ANDROGEL 5gm daily w/ good testosterone level...  DJD>  Followed by DrDean w/ prev left knee arthroscopy...  Vit D Defic>  On OTC Vit D supplementation & ok...  NEUROPATHY>  Followed by DrGibson in [place] (we don't have notes to review & pt will request copies to [place])...  ANXIETY>  ?he still sees Psychiatrist, but we write Rx for Paxil & Klonopin (?)

## 2010-11-26 NOTE — Patient Instructions (Signed)
Today we updated your med list in EPIC...    Continue your current meds the same...  Today we did your follow up fasting blood work...    Please call the [facility] in a few days for your results...    Dial [id number] & when prompted enter your patient number followed by the # symbol...    Your patient number is:  [id number]  Let's get on track w/ our diet & exercise program> the goal is to lose 15-20 lbs!!!  Call for any questions...  Let's plan a follow up eval in 6 months.Marland KitchenMarland Kitchen

## 2010-11-27 MED ORDER — PROMETHAZINE-CODEINE 6.25-10 MG/5ML PO SYRP: 5.0000 mL | ORAL_SOLUTION | Freq: Four times a day (QID) | ORAL | Status: DC | PRN

## 2010-11-27 NOTE — Telephone Encounter (Signed)
Called in the medicine to the pharmacy for the pt as requested for the phenergan with codeine.  lmom to make the pt aware.

## 2010-11-29 ENCOUNTER — Encounter: Payer: Self-pay | Admitting: Pulmonary Disease

## 2011-02-27 ENCOUNTER — Other Ambulatory Visit: Payer: Self-pay | Admitting: Pulmonary Disease

## 2011-04-06 ENCOUNTER — Other Ambulatory Visit: Payer: Self-pay | Admitting: Cardiology

## 2011-05-31 ENCOUNTER — Telehealth: Payer: Self-pay | Admitting: Pulmonary Disease

## 2011-05-31 ENCOUNTER — Ambulatory Visit: Payer: BC Managed Care – PPO | Admitting: Pulmonary Disease

## 2011-05-31 DIAGNOSIS — E785 Hyperlipidemia, unspecified: Secondary | ICD-10-CM

## 2011-05-31 DIAGNOSIS — E291 Testicular hypofunction: Secondary | ICD-10-CM

## 2011-05-31 DIAGNOSIS — Z0000 Encounter for general adult medical examination without abnormal findings: Secondary | ICD-10-CM

## 2011-05-31 DIAGNOSIS — I10 Essential (primary) hypertension: Secondary | ICD-10-CM

## 2011-05-31 DIAGNOSIS — R0602 Shortness of breath: Secondary | ICD-10-CM

## 2011-05-31 NOTE — Telephone Encounter (Signed)
Called and spoke with pt. Pt states he is scheduled for CPX with SN on 3/20.  Would like to come in the week prior to have bloodwork drawn. Specifically would like PSA, Testosterone and cholesterol and "whatever else SN recommends."    Sn, please advise if you are ok with this or not.  And what labs to order.  Thanks!

## 2011-05-31 NOTE — Telephone Encounter (Signed)
Per SN;  Fasting lipid panel, BMET, liver panel, CBC with diff, tsh, psa, and ua.    Called, spoke with pt.  I informed him labs are in the computer, and I advised him he will need to be fasting for these labs.  He verbalized understanding of this and voiced no further questions/cocnerns at this time.

## 2011-06-10 ENCOUNTER — Other Ambulatory Visit (INDEPENDENT_AMBULATORY_CARE_PROVIDER_SITE_OTHER): Payer: BC Managed Care – PPO

## 2011-06-10 ENCOUNTER — Other Ambulatory Visit: Payer: Self-pay | Admitting: Pulmonary Disease

## 2011-06-10 DIAGNOSIS — E785 Hyperlipidemia, unspecified: Secondary | ICD-10-CM

## 2011-06-10 DIAGNOSIS — Z0000 Encounter for general adult medical examination without abnormal findings: Secondary | ICD-10-CM

## 2011-06-10 DIAGNOSIS — E291 Testicular hypofunction: Secondary | ICD-10-CM

## 2011-06-10 DIAGNOSIS — I10 Essential (primary) hypertension: Secondary | ICD-10-CM

## 2011-06-10 DIAGNOSIS — R0602 Shortness of breath: Secondary | ICD-10-CM

## 2011-06-10 LAB — URINALYSIS, ROUTINE W REFLEX MICROSCOPIC
Bilirubin Urine: NEGATIVE
Leukocytes, UA: NEGATIVE
Nitrite: NEGATIVE
Specific Gravity, Urine: 1.015 (ref 1.000–1.030)
Total Protein, Urine: NEGATIVE
pH: 6.5 (ref 5.0–8.0)

## 2011-06-10 LAB — CBC WITH DIFFERENTIAL/PLATELET
Basophils Relative: 0.5 % (ref 0.0–3.0)
Eosinophils Absolute: 0.2 10*3/uL (ref 0.0–0.7)
Eosinophils Relative: 2.8 % (ref 0.0–5.0)
Hemoglobin: 15 g/dL (ref 13.0–17.0)
Lymphocytes Relative: 19 % (ref 12.0–46.0)
MCHC: 33.5 g/dL (ref 30.0–36.0)
MCV: 90.4 fl (ref 78.0–100.0)
Monocytes Absolute: 0.6 10*3/uL (ref 0.1–1.0)
Neutro Abs: 4.4 10*3/uL (ref 1.4–7.7)
RBC: 4.93 Mil/uL (ref 4.22–5.81)
WBC: 6.4 10*3/uL (ref 4.5–10.5)

## 2011-06-10 LAB — BASIC METABOLIC PANEL
BUN: 18 mg/dL (ref 6–23)
Calcium: 9.1 mg/dL (ref 8.4–10.5)
Creatinine, Ser: 1.3 mg/dL (ref 0.4–1.5)
GFR: 61.66 mL/min (ref >60.00)

## 2011-06-10 LAB — LIPID PANEL
Cholesterol: 229 mg/dL — ABNORMAL HIGH (ref 0–200)
HDL: 35.2 mg/dL — ABNORMAL LOW (ref >39.00)
Total CHOL/HDL Ratio: 7
Triglycerides: 177 mg/dL — ABNORMAL HIGH (ref 0.0–149.0)
VLDL: 35.4 mg/dL (ref 0.0–40.0)

## 2011-06-10 LAB — PSA: PSA: 1.73 ng/mL (ref 0.10–4.00)

## 2011-06-10 LAB — HEPATIC FUNCTION PANEL
Bilirubin, Direct: 0 mg/dL (ref 0.0–0.3)
Total Bilirubin: 0.2 mg/dL — ABNORMAL LOW (ref 0.3–1.2)

## 2011-06-10 LAB — LDL CHOLESTEROL, DIRECT: Direct LDL: 140 mg/dL

## 2011-06-13 ENCOUNTER — Other Ambulatory Visit: Payer: Self-pay | Admitting: Pulmonary Disease

## 2011-06-16 ENCOUNTER — Ambulatory Visit (INDEPENDENT_AMBULATORY_CARE_PROVIDER_SITE_OTHER): Payer: BC Managed Care – PPO | Admitting: Pulmonary Disease

## 2011-06-16 ENCOUNTER — Encounter: Payer: Self-pay | Admitting: Pulmonary Disease

## 2011-06-16 ENCOUNTER — Telehealth: Payer: Self-pay | Admitting: Pulmonary Disease

## 2011-06-16 VITALS — BP 124/76 | HR 83 | Temp 99.4°F | Ht 74.0 in | Wt 274.0 lb

## 2011-06-16 DIAGNOSIS — E669 Obesity, unspecified: Secondary | ICD-10-CM

## 2011-06-16 DIAGNOSIS — E785 Hyperlipidemia, unspecified: Secondary | ICD-10-CM

## 2011-06-16 DIAGNOSIS — D130 Benign neoplasm of esophagus: Secondary | ICD-10-CM

## 2011-06-16 DIAGNOSIS — F411 Generalized anxiety disorder: Secondary | ICD-10-CM

## 2011-06-16 DIAGNOSIS — M1990 Unspecified osteoarthritis, unspecified site: Secondary | ICD-10-CM

## 2011-06-16 DIAGNOSIS — G589 Mononeuropathy, unspecified: Secondary | ICD-10-CM

## 2011-06-16 DIAGNOSIS — R059 Cough, unspecified: Secondary | ICD-10-CM

## 2011-06-16 DIAGNOSIS — I10 Essential (primary) hypertension: Secondary | ICD-10-CM

## 2011-06-16 DIAGNOSIS — E291 Testicular hypofunction: Secondary | ICD-10-CM

## 2011-06-16 DIAGNOSIS — G4733 Obstructive sleep apnea (adult) (pediatric): Secondary | ICD-10-CM

## 2011-06-16 DIAGNOSIS — K589 Irritable bowel syndrome without diarrhea: Secondary | ICD-10-CM

## 2011-06-16 DIAGNOSIS — R05 Cough: Secondary | ICD-10-CM

## 2011-06-16 DIAGNOSIS — K219 Gastro-esophageal reflux disease without esophagitis: Secondary | ICD-10-CM

## 2011-06-16 MED ORDER — AMLODIPINE BESYLATE 10 MG PO TABS: 10.0000 mg | ORAL_TABLET | Freq: Every day | ORAL | Status: DC

## 2011-06-16 MED ORDER — FENOFIBRATE 145 MG PO TABS: 145.0000 mg | ORAL_TABLET | Freq: Every day | ORAL | Status: DC

## 2011-06-16 MED ORDER — TESTOSTERONE 50 MG/5GM (1%) TD GEL: 5.0000 g | Freq: Every day | TRANSDERMAL | Status: DC

## 2011-06-16 MED ORDER — PROMETHAZINE-CODEINE 6.25-10 MG/5ML PO SYRP: 5.0000 mL | ORAL_SOLUTION | Freq: Four times a day (QID) | ORAL | Status: DC | PRN

## 2011-06-16 MED ORDER — ESOMEPRAZOLE MAGNESIUM 40 MG PO CPDR: 40.0000 mg | DELAYED_RELEASE_CAPSULE | Freq: Two times a day (BID) | ORAL | Status: DC

## 2011-06-16 MED ORDER — LISINOPRIL 20 MG PO TABS: 20.0000 mg | ORAL_TABLET | Freq: Every day | ORAL | Status: DC

## 2011-06-16 MED ORDER — AMOXICILLIN-POT CLAVULANATE 875-125 MG PO TABS: 1.0000 | ORAL_TABLET | Freq: Two times a day (BID) | ORAL | Status: AC

## 2011-06-16 MED ORDER — HYDROCHLOROTHIAZIDE 25 MG PO TABS: ORAL_TABLET | ORAL | Status: DC

## 2011-06-16 MED ORDER — MOMETASONE FUROATE 50 MCG/ACT NA SUSP: 2.0000 | Freq: Two times a day (BID) | NASAL | Status: DC

## 2011-06-16 NOTE — Progress Notes (Addendum)
Subjective:    Patient ID: Ronald King, male    DOB: 06/06/1945, 66 y.o.   MRN: [id number]  HPI Ronald King here for a follow up visit... he has a chronic cough x years and is dependent on codeine containing cough syrups... he was miserable when ENDAL HD went off the market and we had to substitute PHENERGAN EXPECTORANT w/ CODEINE - 1 tsp by mouth Q6H as needed for cough, limiting him to 1 pint per month...   ~  May 27, 2010:  Yearly check up> feeling some better since starting on Androgel 5gm/d for Low-T & Testos level has improved from 204 to 553... he continues on nasal regimen for sinuses but c/o tinnitus and decr hearing- he is rec to see ENT for further eval & we will refer;  continues on phen expec w/ codeine for his chr cough;  BP controlled on his 3 med regimen;  Lipids still look poor on Fibrate alone & he still refuses Statin & Lipid Clinic eval- "I can do it on diet alone";  he takes a number of vits & supplements... he sees Stage manager for Paxil & Klonopin Rx; and ?DrGibson [place] [facility]..  ~  November 26, 2010:  104mo ROV & he reports recent bronchitic episode improved after Augmentin rx & c/o fatigue- doesn't have the energy he feels he should have; he wants chol, vit d & testosterone levels checked; due for CXR as well...  see prob list below >>    He reports seeing his Neurologist, DrGibson in [place] for neuropathy in his feet (feet worse- numb all the time now) & they have tried several meds- Neurontin, Lyrica, one other (INTOL to these due to 'memory gaps' he says), & pt refuses Cymbalta> asked to keep me informed of his progress & try to get Neuro notes sent to me for our records.  ~  June 16, 2011:  47mo ROV & Taiven mentioned that he starts on Medicare in June & wanted my recs for a primary care doctor;  He has OSA- on CPAP nightly, wt unchanged at 274# & we reviewed diet recs;  Chr cough managed w/ Phen Expect w/ Codeine & he is lim to 1pt per month- he is here for  refill Rx for the next 104mo;  BP controlled on his 3 meds- see below;  FLP remains poorly controlled on his diet + Tricor145> he now agrees to start Statin & rec Crestor20;  See prob list below>>    He saw his Neurologist- DrGibson in [place] 10/12 for f/u Neuropathy & for ?CTS, & Tinnitus (rec to f/u w/ his ENT)> DrGibson gives him Rx for Hydrocodone-Tylenol 2.5-500 Tid as needed #90 at a time w/ refills, and bilat wrist splints...    He reports that he still sees Psychiatrist DrSanders- on Klonopin 1mg  Bid and Paxil CR 25mg /d; we do not have any notes from them... LABS  3/13:  FLP- all parameters off on diet alone;  Chems- ok x BS=126 Creat=1.3;  CBC- wnl;  TSH=1.88;  PSA=1.73;  Testos=185 on Testim Q3d;  UA=clear.          Problem List:     << PROBLEM LIST UPDATED 06/16/11 >>  Hx of SINUSITIS (ICD-473.9) - on NASONEX 2spBid + he requests Rx for AMOXICILLIN to keep on hand for travel... prev eval DrByers- "he wanted to do surg"... "my sinuses are crazy" but he refuses to consider the surg... also had dysphonia eval at [facility] in the 90's w/  Botox tried.  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 saw DrClance 12/09- he felt upper airway resistance syndrome... doing better w/ new CPAP machine in 2010.  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 he had an extensive eval in the past w/ GI, ENT, Pulm, etc... he uses PHEN EXPECT w/ CODEINE 1 tsp Q6H up to 1 pint/mo... ~  CXR 1/10 showed clear,  just min biapical pleural thickening... ~  2/11: states intol to Symbicort w/ HAs... try ADVAIR 250Bid... ~  CXR 2/11 showed clear lungs, NAD.Marland Kitchen. ~  CXR 8/12 showed chr bronchitic changes, NAD...  HYPERTENSION (ICD-401.9) - meds adjusted by DrCrenshaw on NORVASC 10mg /d, LISINOPRIL 20mg /d, & HCTZ 25mg - 1/2 tab daily...  ~  8/12:  BP= 134/82 & denies HA, fatigue, visual changes, CP, palipit, dizziness, syncope, dyspnea, edema, etc... ~  3/13:  BP= 124/76 & he denies CP, palpit, SOB,  edema...  DYSLIPIDEMIA (ICD-272.4) - on TRICOR 145mg /d but refuses Statin meds or Lipid Clinic referral... ~  FLP 1/10 showed TChol 161, TG 239, HDL 30, LDL 72... he wants to control w/ diet. ~  FLP 9/10 showed TChol 192, TG 426, HDL 30, LDL 80... rec> start [id number]. ~  FLP 2/11 showed TChol 173, TG 102, HDL 37, LDL 115... continue Tricor Rx + diet. ~  FLP 2/12 showed TChol 221, TG 249, HDL 31, LDL 140... Reminded to take med regularly & get wt down. ~  FLP 8/12 on Tric145 showed TChol 194, TG 169, HDL 32, LDL 128... Still not at goal, refuses statin med. ~  FLP 3/13 on Tric145 showed TChol 229, TG 177, HDL 35, LDL 140... rec to start CRESTOR20 + better low fat diet!  OBESITY (ICD-278.00) - weight = 274#,  6\' 2"  tall = BMI of 35... discussed diet + exercise and the need for weight reduction both from his overall health status and his OSA... "I moderate what I eat". ~  weight 1/10 = 267# ~  weight 2/11 = 272#... discussed again! ~  Weight 2/12 = 276#... He is unconcerned! ~  Weight 8/12 = 274#... Reviewed diet & exercise needed. ~  Weight 3/13 = 274#... He is NOT trying.  GERD (ICD-530.81) - on NEXIUM 40mg Bid & off the prev hs Reglan... s/p lap nissen 3/01 by DrMartin.... ~  EGD 11/05 showed prev Nissen, +gastritis... ~  EGD 6/10 by DrDBrodie showed functioning Nissen, post-op changes, sessile polyp in distal esoph= benign mucosa.  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 ~  f/u colonoscopy 6/10 by DrDBrodie showed negative x for int hems...  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  Finally started ANDROGEL 5gm packet Qd in 2012 ~  labs 1/10 showed PSA= 1.63 ~  labs 2/11 showed PSA= 1.65,  ~  Labs 1/12 showed Testosterone level = 160-205 ~  Labs 2/12 showed PSA = 1.63, Testosterone level = 553 ~  Labs 8/12 showed Testosterone level = 428... rec continue daily Androgel. ~  Labs 3/13  showed PSA= 1.73, Testosterone level = 185 & states he is using the Androgel Q3rd day, rec to incr to daily.  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  VITAMIN D DEFICIENCY (ICD-268.9) - on Vit D OTC 2000 u daily... ~  labs 1/10 showed Vit D level = 16... Vit D 2000 u daily started. ~  labs 2/11 showed Vit D level =  34... continue supplement. ~  Labs 8/12 showed Vit D level = 47... continue same.  HEADACHE (ICD-784.0) - Chr daily migraines in the past w/ prev [place] eval by DrFreeman (Botox shots helped in the past)...  NEUROPATHY (ICD-355.9) - c/o burning in legs and eval from Neuro in [facility] & Rx w/ Neurontin + Hydrocodone for pain... this may be part of a familial trait as several relatives have severe periph neuropathy (?hereditary syndrome)- we don't have notes from [facility] neurology. ~  Pt reports that he was intol to Neurontin, Lyrica, & one other ("memory gaps") & he refused Cymbalta trial... ~  3/13:  Reviewed w/ pt> he continues on VICODIN 2.5-500 from DrGibson up to Tid, 90/mo w/ refills from him...  ANXIETY (ICD-300.00) - on PAXIL CR 25mg /d & KLONOPIN 1mg  Bid as needed... they were prev perscribed by ?psychiatrist? but he notes "I've been on these for >35yrs"... he still sees ?DrSteven?DrSanders?, but we fill his perscriptions.  Health Maintenance - he takes ASA 81mg /d, & many Vits & supplements (states they help his RLS & leg cramps)... he is up to date on all vaccinations [facility]...   Past Surgical History  Procedure Date  . Laparoscopic nissen fundoplication 05/1999    Dr. Daphine Deutscher  . Knee arthroscopy 2007    Dr. August Saucer  . Tonsillectomy   . Nose surgery   . Anal fissure repair   . Hand fracture surgery     left    Outpatient Encounter Prescriptions as of 06/16/2011  Medication Sig Dispense Refill  . amLODipine (NORVASC) 10 MG tablet Take 10 mg by mouth daily.        .  ANDROGEL 50 MG/5GM GEL apply 1 packet as directed once daily  30 g  5  . ascorbic acid (VITAMIN C) 500 MG tablet Take 500 mg by mouth daily.        Marland Kitchen aspirin 81 MG tablet Take 81 mg by mouth daily.        . Calcium Carbonate-Vitamin D (RA CALCIUM PLUS VITAMIN D) 600-400 MG-UNIT per tablet Take 1 tablet by mouth daily.        . Cholecalciferol (VITAMIN D) 2000 UNITS tablet Take 2,000 Units by mouth daily.        . clonazePAM (KLONOPIN) 1 MG tablet Take 1 mg by mouth 2 (two) times daily as needed.        . clotrimazole (LOTRIMIN) 1 % cream Apply topically 2 (two) times daily.        . Cyanocobalamin (VITAMIN B-12) 2500 MCG SUBL Place 1 tablet under the tongue daily.        . fenofibrate (TRICOR) 145 MG tablet take 1 tablet by mouth once daily  90 tablet  4  . Homeopathic Products (ZICAM COLD REMEDY) LIQD Take by mouth as needed.        . hydrochlorothiazide (HYDRODIURIL) 25 MG tablet take 1/2 tablet by mouth once daily  30 tablet  11  . HYDROcodone-acetaminophen (VICODIN) 2.5-500 MG per tablet Take 1 tablet by mouth every 6 (six) hours as needed.        Marland Kitchen lisinopril (PRINIVIL,ZESTRIL) 20 MG tablet take 1 tablet by mouth once daily  30 tablet  1  . methocarbamol (ROBAXIN) 750 MG tablet Take 750 mg by mouth 3 (three) times daily as needed.       . mometasone (NASONEX) 50 MCG/ACT nasal spray Place 2 sprays into the nose 2 (two) times daily.        Marland Kitchen  Multiple Vitamins-Minerals (CENTRUM SILVER PO) Take 1 tablet by mouth daily.        Marland Kitchen NEXIUM 40 MG capsule take 1 capsule by mouth twice a day 30 MINUTES BEFORE MEALS.  180 capsule  4  . PARoxetine (PAXIL-CR) 25 MG 24 hr tablet Take 25 mg by mouth every morning.        . promethazine-codeine (PHENERGAN WITH CODEINE) 6.25-10 MG/5ML syrup Take 5 mLs by mouth every 6 (six) hours as needed.  120 mL  5  . Pyridoxine HCl (VITAMIN B-6 CR) 200 MG TBCR Take 1 tablet by mouth daily.        Marland Kitchen DISCONTD: testosterone (ANDROGEL) 50 MG/5GM GEL Place 5 g onto the skin  daily. Apply 1 packet to skin daily  30 Package  5    Allergies  Allergen Reactions  . Cefuroxime Axetil     REACTION: unsure of reaction  . Montelukast Sodium     REACTION: unsure of reaction    Current Medications, Allergies, Past Medical History, Past Surgical History, Family History, and Social History were reviewed in [employer] record.   Review of Systems        See HPI - all other systems neg except as noted...  The patient complains of dyspnea on exertion.  The patient denies anorexia, fever, weight loss, weight gain, vision loss, decreased hearing, hoarseness, chest pain, syncope, peripheral edema, prolonged cough, headaches, hemoptysis, abdominal pain, melena, hematochezia, severe indigestion/heartburn, hematuria, incontinence, muscle weakness, suspicious skin lesions, transient blindness, difficulty walking, depression, unusual weight change, abnormal bleeding, enlarged lymph nodes, and angioedema.     Objective:   Physical Exam      WD, Obese, 66 y/o King in NAD... GENERAL:  Alert & oriented; pleasant & cooperative... HEENT:  [place]/AT, EOM-wnl, PERRLA, EACs-clear, TMs-wnl, NOSE-clear, THROAT-clear & wnl. NECK:  Supple w/ fairROM; no JVD; normal carotid impulses w/o bruits; no thyromegaly or nodules palpated; no lymphadenopathy. CHEST:  Clear to P & A; without wheezes/ rales/ or rhonchi; he has an intermittent dry irritative cough... HEART:  Regular Rhythm; without murmurs/ rubs/ or gallops. ABDOMEN:  Obese, soft & nontender; normal bowel sounds; no organomegaly or masses detected. EXT: without deformities, mild arthritic changes; no varicose veins/ venous insuffic/ or edema. NEURO:  CN's intact; motor testing normal; no focal neuro deficits DERM:  No lesions noted; no rash etc...  RADIOLOGY DATA:  Reviewed in the EPIC EMR & discussed w/ the patient...  LABORATORY DATA:  Reviewed in the EPIC EMR & discussed w/ the patient...   Assessment & Plan:    OSA>  He uses CPAP nightly, continue same...  Chronic Cough>  He is quite dependent on the Phen expect w/ codeine, limited to one pint per month...  HBP>  Controlled on CCB, ACE, Diuretic; continue same; he understands that substantial wt reduction would help w/ BP control & meds.  DYSLIPIDEMIA>  On Tricor + diet & not at goal; he has regularly refused Statin Rx but now agrees to try CRESTOR 20mg /d...  Obesity>  We reviewed diet & exercise but he has been noncompliant w/ both...  GI> GERD, IBS, Hems>  Stable on NexiumBid, etc...  GU> BPH, ED, Hypogonadism>  Prev eval by DrPeterson, on ANDROGEL 5gm daily w/ good testosterone level if her sticks w/ it daily!  DJD>  Followed by DrDean w/ prev left knee arthroscopy...  Vit D Defic>  On OTC Vit D supplementation & ok...  NEUROPATHY>  Followed by DrGibson in [place] & intol  to Neurontin/ Lyrica, etc; on VICODIN 2.5-500 Tid per Neurology...  ANXIETY>  ?he still sees Psychiatrist, but we write Rx for Paxil & Klonopin (?)

## 2011-06-16 NOTE — Telephone Encounter (Signed)
Have already spoken with the pharmacy about this pt.

## 2011-06-16 NOTE — Patient Instructions (Signed)
Today we updated your med list in our EPIC system...    Continue your current medications the same...    We refilled your meds per request...  We reviewed your recent blood work & gave you a copy...  We decided to start CRESTOR 20mg /d for your cholesterol...  Call for any questions...  Let's plan a recheck in 6 months or sooner if needed for problems.Marland KitchenMarland Kitchen

## 2011-06-23 ENCOUNTER — Telehealth: Payer: Self-pay | Admitting: Pulmonary Disease

## 2011-06-23 NOTE — Telephone Encounter (Signed)
Member ID # [id number]. DX:  GERD.  Nexium 40mg  APPROVED from 06/02/11 through 06/22/12 for twice a day. Patient and pharmacy have been notified.

## 2011-07-01 ENCOUNTER — Telehealth: Payer: Self-pay | Admitting: Pulmonary Disease

## 2011-07-01 MED ORDER — ESOMEPRAZOLE MAGNESIUM 40 MG PO CPDR: 40.0000 mg | DELAYED_RELEASE_CAPSULE | Freq: Two times a day (BID) | ORAL | Status: DC

## 2011-07-01 NOTE — Telephone Encounter (Signed)
This PA was already approved on 06/23/11 and is good until 06/22/12! Spoke with pt and advised him of this. He states that his pharmacy says that they do not have rx refill on file for this. I advised will send refill to pharm. Rx was sent and pt states nothing further needed.

## 2011-07-28 DIAGNOSIS — E119 Type 2 diabetes mellitus without complications: Secondary | ICD-10-CM

## 2011-07-28 HISTORY — DX: Type 2 diabetes mellitus without complications: E11.9

## 2011-07-30 ENCOUNTER — Ambulatory Visit (INDEPENDENT_AMBULATORY_CARE_PROVIDER_SITE_OTHER): Payer: BC Managed Care – PPO | Admitting: Internal Medicine

## 2011-07-30 VITALS — BP 148/86 | HR 88 | Temp 98.1°F | Wt 275.0 lb

## 2011-07-30 DIAGNOSIS — R7309 Other abnormal glucose: Secondary | ICD-10-CM

## 2011-07-30 DIAGNOSIS — M1990 Unspecified osteoarthritis, unspecified site: Secondary | ICD-10-CM

## 2011-07-30 DIAGNOSIS — E291 Testicular hypofunction: Secondary | ICD-10-CM

## 2011-07-30 DIAGNOSIS — I10 Essential (primary) hypertension: Secondary | ICD-10-CM

## 2011-07-30 DIAGNOSIS — G589 Mononeuropathy, unspecified: Secondary | ICD-10-CM

## 2011-07-30 DIAGNOSIS — R739 Hyperglycemia, unspecified: Secondary | ICD-10-CM | POA: Insufficient documentation

## 2011-07-30 DIAGNOSIS — E785 Hyperlipidemia, unspecified: Secondary | ICD-10-CM

## 2011-07-30 NOTE — Assessment & Plan Note (Signed)
Well-controlled, no change 

## 2011-07-30 NOTE — Assessment & Plan Note (Signed)
Last testosterone low, at the time ,compliance  Was poor w/ meds. Plan: Recheck testosterone in 2 months

## 2011-07-30 NOTE — Assessment & Plan Note (Signed)
Started Crestor --samples-- few weeks ago, good tolerance. Plan:  Provide more samples, check FLP, AST, ALT. Prescription w/ results.

## 2011-07-30 NOTE — Patient Instructions (Signed)
Please come back fasting FLP, AST, ALT ---- dx hyperlipidemia Hemoglobin A1c --- dx hyperglycemia

## 2011-07-30 NOTE — Assessment & Plan Note (Signed)
Uses occasional Robaxin for back and neck pain

## 2011-07-30 NOTE — Progress Notes (Signed)
  Subjective:    Patient ID: Ronald King, male    DOB: Aug 04, 1945, 66 y.o.   MRN: [id number]  HPI New patient, here to get established, extensive chart review. History of hypogonadism, last testosterone level was low but he was not compliant w/ meds . Currently back to everyday use of testosterone. History of neuropathy, well-controlled with  Vicodin. High cholesterol, started Crestor based on the last cholesterol panel. Apparently no side effects. History of GERD, symptoms well-controlled Hypertension, good medication compliance.   Past Medical History: Hypertension Hyperlipidemia Chronic bronchitis, chronic cough Sleep apnea hypogonadism anxiety GI --GERD, status post Nissen fundoplication --History of anemia  --Esophageal polyps --GI bleed hemorrhoids Neuropathy-- on vicodin Vit D def  DJD   Past Surgical History: S/P lap nissen fundoplication by Dr Daphine Deutscher 3/01 S/P left knee arthroscopy in 2007 by Dr August Saucer Tonsillectomy Nasal Surgery Anal fissure and cyst removal (pilonidal?) Left hand fracture/surgery  Social History: Married- 43 yrs 1 Daughter- ?hx Crohn's disease, stress... 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 No apparent distress.  Lungs -- normal respiratory effort, no intercostal retractions, no accessory muscle use, and normal breath sounds.   Heart-- normal rate, regular rhythm, no murmur, and no gallop.    Extremities-- no pretibial edema bilaterally  Neurologic-- alert & oriented X3 and strength normal in all extremities. Psych-- Cognition and judgment appear intact. Alert and cooperative with normal attention span and concentration.  not anxious appearing and not depressed appearing.         Assessment & Plan:    Patient is here to get established, we agreed that he will continue to see his previous doctor, Dr. Kriste Basque  for all his pulmonary needs, I will take care of his other medical problems. Extensive chart review is done today.

## 2011-07-30 NOTE — Assessment & Plan Note (Signed)
Well-controlled with Vicodin as needed

## 2011-07-30 NOTE — Assessment & Plan Note (Addendum)
Last fasting blood sugar was 126, will check a hemoglobin A1c, see  instructions

## 2011-08-01 ENCOUNTER — Encounter: Payer: Self-pay | Admitting: Internal Medicine

## 2011-08-04 ENCOUNTER — Other Ambulatory Visit: Payer: Self-pay | Admitting: Internal Medicine

## 2011-08-04 DIAGNOSIS — E785 Hyperlipidemia, unspecified: Secondary | ICD-10-CM

## 2011-08-04 DIAGNOSIS — R739 Hyperglycemia, unspecified: Secondary | ICD-10-CM

## 2011-08-05 ENCOUNTER — Other Ambulatory Visit (INDEPENDENT_AMBULATORY_CARE_PROVIDER_SITE_OTHER): Payer: BC Managed Care – PPO

## 2011-08-05 DIAGNOSIS — R7309 Other abnormal glucose: Secondary | ICD-10-CM

## 2011-08-05 DIAGNOSIS — E785 Hyperlipidemia, unspecified: Secondary | ICD-10-CM

## 2011-08-05 DIAGNOSIS — R739 Hyperglycemia, unspecified: Secondary | ICD-10-CM

## 2011-08-05 LAB — ALT: ALT: 27 U/L (ref 0–53)

## 2011-08-05 LAB — LIPID PANEL
LDL Cholesterol: 80 mg/dL (ref 0–99)
Total CHOL/HDL Ratio: 5
Triglycerides: 188 mg/dL — ABNORMAL HIGH (ref 0.0–149.0)

## 2011-08-05 LAB — HEMOGLOBIN A1C: Hgb A1c MFr Bld: 6.3 % (ref 4.6–6.5)

## 2011-08-05 LAB — AST: AST: 25 U/L (ref 0–37)

## 2011-08-05 NOTE — Progress Notes (Signed)
Lab only 

## 2011-08-06 LAB — TESTOSTERONE, FREE, TOTAL, SHBG: Testosterone: 189.83 ng/dL — ABNORMAL LOW (ref 300–890)

## 2011-08-09 ENCOUNTER — Telehealth: Payer: Self-pay | Admitting: Internal Medicine

## 2011-08-09 DIAGNOSIS — E119 Type 2 diabetes mellitus without complications: Secondary | ICD-10-CM

## 2011-08-09 NOTE — Telephone Encounter (Signed)
Advise patient: He has developed mild diabetes, does not need medication, he does need to see a dietitian. Please arrange a referral Cholesterol has improved with Crestor, please send a prescription . Triglycerides are slightly elevated. Recheck in few months. Testosterone is low, increase AndroGel from 5 mg daily to 10 mg daily.

## 2011-08-10 NOTE — Telephone Encounter (Signed)
Referral done

## 2011-08-10 NOTE — Telephone Encounter (Signed)
Discussed with pt & upon request I faxed results to pt.

## 2011-08-10 NOTE — Telephone Encounter (Signed)
What diagnosis code for the referral to a dietitian?

## 2011-08-10 NOTE — Telephone Encounter (Signed)
Diabetes

## 2011-09-06 ENCOUNTER — Encounter: Payer: Medicare Other | Attending: Internal Medicine | Admitting: *Deleted

## 2011-09-06 ENCOUNTER — Encounter: Payer: Self-pay | Admitting: *Deleted

## 2011-09-06 ENCOUNTER — Telehealth: Payer: Self-pay | Admitting: Pulmonary Disease

## 2011-09-06 VITALS — Ht 73.0 in | Wt 269.6 lb

## 2011-09-06 DIAGNOSIS — Z713 Dietary counseling and surveillance: Secondary | ICD-10-CM | POA: Insufficient documentation

## 2011-09-06 DIAGNOSIS — R739 Hyperglycemia, unspecified: Secondary | ICD-10-CM

## 2011-09-06 DIAGNOSIS — E669 Obesity, unspecified: Secondary | ICD-10-CM

## 2011-09-06 DIAGNOSIS — R7309 Other abnormal glucose: Secondary | ICD-10-CM | POA: Insufficient documentation

## 2011-09-06 NOTE — Telephone Encounter (Signed)
I spoke with pt and he states his medicare takes effect 09/27/11. Pt needs to be re-established with [facility] before medicare will pay for any of his cpap supplies. Pt is going on vacation and is scheduled to see [facility] 10/04/11 at 11:30.

## 2011-09-06 NOTE — Patient Instructions (Signed)
Plan: Aim for 4 Carb Choices +/-1 per meal (60 grams) Aim for 2 Carb Choices per snack if hungry (includes beverages) Read Food Labels for Total Carbohydrate of foods, in addition to fat grams, types of fat, sodium Aim for 1 serving/order of rice or potatoes instead of double order Consider lower sugar beverages (Pepsi Next is only 1 Carb Choice per can Carb counting book called Calorie Brooke Dare is available at book store Continue being active on a daily basis

## 2011-09-06 NOTE — Progress Notes (Signed)
  Medical Nutrition Therapy:  Appt start time: 1100 end time:  1200.  Assessment:  Primary concerns today: patient here with his wife who appears supportive. He has a history of eating whatever he wants whenever he wants with no consideration as to the foods effect on his body and weight. Since his last MD appointment when he was informed of some elevated BGs, he has reduced his food portions and is going to gym with his wife 3 days a week. He states a weight loss of 8 pounds.    MEDICATIONS: see list   DIETARY INTAKE:  Usual eating pattern includes 3 meals and 0-1 snacks per day.  Everyday foods include good variety of all food groups in large portion sizes.  Avoided foods include coffee.    24-hr recall:  B ( AM): light: cereal with 1% milk  OR scrambled eggs with toast  Snk ( AM): none  L ( PM): hot meal, grilled food OR mac and cheese OR banana sandwich OR soup OR left overs Snk ( PM): none D ( PM): lighter meal or reverse with main meal at night Snk ( PM): not usually OR cold fruit OR crackers and cheese OR ice cream OR pie if available occasionally Beverages: sweet decaf tea OR regular decaf Pepsi OR Cheerwine for acid stomach problems, water  Usual physical activity: used to walk, torn meniscus with surgery, now gym 3 times a week with weights, bike occasionally swim, yard work  Estimated energy needs: 2100 calories 235 g carbohydrates 158 g protein 58 g fat  Progress Towards Goal(s):  In progress.   Nutritional Diagnosis:  NI-1.5 Excessive energy intake As related to activity level.  As evidenced by BMI of 35.6%.    Intervention:  Nutrition counseling and diabetes education provided. Explained difference between diagnostic lab values for pre-diabetes and diabetes, basic physiology of diabetes, rationale for SMBG for people who have diabetes, carb counting and reading food labels. Plan: Aim for 4 Carb Choices +/-1 per meal (60 grams) Aim for 2 Carb Choices per snack if  hungry (includes beverages) Read Food Labels for Total Carbohydrate of foods, in addition to fat grams, types of fat, sodium Aim for 1 serving/order of rice or potatoes instead of double order Consider lower sugar beverages (Pepsi Next is only 1 Carb Choice per can Carb counting book called Calorie Brooke Dare is available at book store Continue being active on a daily basis  Handouts given during visit include: Living Well with Diabetes Carb Counting and Food Label handouts Meal Plan Card  Monitoring/Evaluation:  Dietary intake, exercise, reading food labels, and body weight prn or after next MD appt in September, 2013.

## 2011-09-09 ENCOUNTER — Other Ambulatory Visit: Payer: Self-pay | Admitting: Internal Medicine

## 2011-09-09 MED ORDER — ROSUVASTATIN CALCIUM 20 MG PO TABS: 20.0000 mg | ORAL_TABLET | Freq: Every day | ORAL | Status: DC

## 2011-09-09 NOTE — Telephone Encounter (Signed)
refill for Crestor 20mg  * been getting a 90 day supply  Please send to [facility] on [address] [id number] - [place] - [id number] NOTE samples gived for 35-month supply

## 2011-09-09 NOTE — Telephone Encounter (Signed)
Refill done.  

## 2011-10-04 ENCOUNTER — Encounter: Payer: Self-pay | Admitting: Pulmonary Disease

## 2011-10-04 ENCOUNTER — Ambulatory Visit (INDEPENDENT_AMBULATORY_CARE_PROVIDER_SITE_OTHER): Payer: Medicare Other | Admitting: Pulmonary Disease

## 2011-10-04 VITALS — BP 114/76 | HR 80 | Temp 98.5°F | Ht 74.0 in | Wt 272.8 lb

## 2011-10-04 DIAGNOSIS — G4733 Obstructive sleep apnea (adult) (pediatric): Secondary | ICD-10-CM

## 2011-10-04 NOTE — Patient Instructions (Addendum)
Will send an order to your dme for new mask and supplies. Work on weight loss followup with me in 1 year.

## 2011-10-04 NOTE — Progress Notes (Signed)
Subjective:    Patient ID: Ronald King, male    DOB: [birth year], 66 y.o.   MRN: [id number]  HPI The patient is a 66 year old male who been asked to see for management of obstructive sleep apnea.  He was diagnosed with the upper airway resistant syndrome in 1999, where his sleep study showed an AHI of 3 per hour, but also has loud snoring with fragmented sleep and respiratory effort-related arousals.  The patient was also having significant daytime sleepiness, especially with driving.  He was started on CPAP at that time, and did very well.  He has not been seen since 2009, and continues on his CPAP.  He feels that he is sleeping well with the device, and has been keeping up with his mask changes and supplies.  He recently started on Medicare, and therefore is trying to reestablish with a sleep specialist.  The patient feels fairly rested in the mornings upon arising, and is satisfied with his daytime alertness.  He will occasionally take an afternoon nap on rare occasions, and denies any sleepiness with driving.  His weight is up 7 pounds from his last visit with me in 2009, and his Epworth score today is 4  Sleep Questionnaire: What time do you typically go to bed?( Between what hours) 10pm - 7:30am How long does it take you to fall asleep? soon How many times during the night do you wake up? 2 What time do you get out of bed to start your day? 0800 Do you drive or operate heavy machinery in your occupation? No How much has your weight changed (up or down) over the past two years? (In pounds) 10 lb (4.536 kg) Have you ever had a sleep study before? Yes If yes, location of study? If yes, date of study? Do you currently use CPAP? Yes If so, what pressure? Do you wear oxygen at any time? No     Review of Systems  Constitutional: Negative for fever and unexpected weight change.  HENT: Positive for congestion and sneezing. Negative for ear pain, nosebleeds, sore throat, rhinorrhea, trouble  swallowing, dental problem, postnasal drip and sinus pressure.   Eyes: Negative for redness and itching.  Respiratory: Positive for cough and shortness of breath. Negative for chest tightness and wheezing.   Cardiovascular: Positive for leg swelling. Negative for palpitations.  Gastrointestinal: Negative for nausea and vomiting.  Genitourinary: Negative for dysuria.  Musculoskeletal: Negative for joint swelling.  Skin: Negative for rash.  Neurological: Positive for headaches.  Hematological: Does not bruise/bleed easily.  Psychiatric/Behavioral: Negative for dysphoric mood. The patient is nervous/anxious.   All other systems reviewed and are negative.       Objective:   Physical Exam Constitutional:  Obese male, no acute distress  HENT:  Right nare patent without discharge, left obstructed with deviated septum and edema.  Oropharynx without exudate, palate and uvula are moderately elongated.   Eyes:  Perrla, eomi, no scleral icterus  Neck:  No JVD, no TMG  Cardiovascular:  Normal rate, regular rhythm, no rubs or gallops.  No murmurs        Intact distal pulses  Pulmonary :  Normal breath sounds, no stridor or respiratory distress   No rales, rhonchi, or wheezing  Abdominal:  Soft, nondistended, bowel sounds present.  No tenderness noted.   Musculoskeletal:  No lower extremity edema noted.  Lymph Nodes:  No cervical lymphadenopathy noted  Skin:  No cyanosis noted  Neurologic:  Alert, appropriate, does not appear sleepy,  moves all 4 extremities without obvious deficit.         Assessment & Plan:

## 2011-10-04 NOTE — Assessment & Plan Note (Signed)
The patient has a history of the upper airway resistance syndrome with significant symptoms, and responded very well to CPAP.  He is currently doing well on the device, and is satisfied with his daytime alertness.  He is due for supplies and a new mask, and we will send this order to his DME.  I have stressed to him the importance of aggressive weight loss, and that he must follow up with me on a yearly basis.

## 2011-10-11 ENCOUNTER — Telehealth: Payer: Self-pay | Admitting: *Deleted

## 2011-10-11 NOTE — Telephone Encounter (Signed)
Pt left vm on triage for someone to call him back, no other information left

## 2011-10-11 NOTE — Telephone Encounter (Signed)
Okay to refill one month. He is due for labs, please reschedule: Testosterone, free testosterone---  DX hypogonadism

## 2011-10-11 NOTE — Telephone Encounter (Signed)
Please advise on Rx for androgel

## 2011-10-12 MED ORDER — TESTOSTERONE 50 MG/5GM (1%) TD GEL: 5.0000 g | Freq: Every day | TRANSDERMAL | Status: DC

## 2011-10-12 NOTE — Telephone Encounter (Signed)
Discuss with patient  

## 2011-10-29 ENCOUNTER — Telehealth: Payer: Self-pay | Admitting: *Deleted

## 2011-10-29 NOTE — Telephone Encounter (Signed)
Spoke to pharmacy as of 10-27-11 no PA is required for androgel. Claim was processed with no issue. Will hold off on proceeding with PA at this time will await claim denied from pharmacy and will proceed with PA at that time if needed.

## 2011-11-16 ENCOUNTER — Other Ambulatory Visit: Payer: Self-pay | Admitting: Internal Medicine

## 2011-11-16 MED ORDER — TESTOSTERONE 50 MG/5GM (1%) TD GEL: TRANSDERMAL | Status: DC

## 2011-11-16 NOTE — Addendum Note (Signed)
Addended by: Edwena Felty T on: 11/16/2011 04:56 PM   Modules accepted: Orders

## 2011-11-16 NOTE — Telephone Encounter (Signed)
Refill done.  

## 2011-12-02 ENCOUNTER — Ambulatory Visit (INDEPENDENT_AMBULATORY_CARE_PROVIDER_SITE_OTHER): Payer: Medicare Other | Admitting: Internal Medicine

## 2011-12-02 VITALS — BP 130/84 | HR 76 | Temp 98.2°F | Wt 272.0 lb

## 2011-12-02 DIAGNOSIS — E291 Testicular hypofunction: Secondary | ICD-10-CM

## 2011-12-02 DIAGNOSIS — E119 Type 2 diabetes mellitus without complications: Secondary | ICD-10-CM

## 2011-12-02 DIAGNOSIS — E785 Hyperlipidemia, unspecified: Secondary | ICD-10-CM

## 2011-12-02 DIAGNOSIS — I10 Essential (primary) hypertension: Secondary | ICD-10-CM

## 2011-12-02 LAB — BASIC METABOLIC PANEL
BUN: 16 mg/dL (ref 6–23)
CO2: 30 mEq/L (ref 19–32)
Chloride: 105 mEq/L (ref 96–112)
GFR: 65.8 mL/min (ref >60.00)
Glucose, Bld: 91 mg/dL (ref 70–99)
Potassium: 3.8 mEq/L (ref 3.5–5.1)
Sodium: 143 mEq/L (ref 135–145)

## 2011-12-02 NOTE — Assessment & Plan Note (Signed)
Ambulatory BPs normal, good BP today  Good medication compliance, needs to continue with same meds and check a BMP

## 2011-12-02 NOTE — Assessment & Plan Note (Addendum)
Last testosterone was low, currently take AndroGel one packet daily (5 gr/pack). Will continue with the same dose for now and check a testosterone level.

## 2011-12-02 NOTE — Assessment & Plan Note (Signed)
Recently diagnosed with mild diabetes, status post a nutrition visit, doing much better with diet, he remains very active, has lost about 10 pounds Plan: Recheck a hemoglobin A1c

## 2011-12-02 NOTE — Progress Notes (Signed)
  Subjective:    Patient ID: Ronald King, male    DOB: 1945/04/06, 66 y.o.   MRN: [id number]  HPI Routine office visit Diabetes, recently diagnosed, saw a dietitian, eating much better, has lost approximately 10 pounds Hypogonadism, currently taking AndroGel 1 pack a day, has not seen a major difference in libido or his energy level. He is due to check a testosterone level. High cholesterol, good response to medications. Sleep apnea, recently saw Dr. 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     Objective:   Physical Exam General -- alert, well-developed, and overweight appearing. No apparent distress.  Lungs -- normal respiratory effort, no intercostal retractions, no accessory muscle use, and normal breath sounds.   Heart-- normal rate, regular rhythm, no murmur, and no gallop.   Extremities-- trace pretibial edema bilaterally  Neurologic-- alert & oriented X3 and strength normal in all extremities. Psych-- Cognition and judgment appear intact. Alert and cooperative with normal attention span and concentration.  not anxious appearing and not depressed  appearing.       Assessment & Plan:

## 2011-12-02 NOTE — Assessment & Plan Note (Signed)
Good compliance with medicines, last cholesterol panel improved, check the AST and ALT. Continue with same medicines

## 2011-12-03 ENCOUNTER — Encounter: Payer: Self-pay | Admitting: Internal Medicine

## 2011-12-03 LAB — TESTOSTERONE, FREE, TOTAL, SHBG
Sex Hormone Binding: 21 nmol/L (ref 13–71)
Testosterone, Free: 51.6 pg/mL (ref 47.0–244.0)
Testosterone-% Free: 2.4 % (ref 1.6–2.9)

## 2011-12-08 MED ORDER — TESTOSTERONE 50 MG/5GM (1%) TD GEL: TRANSDERMAL | Status: DC

## 2011-12-08 NOTE — Addendum Note (Signed)
Addended by: Edwena Felty T on: 12/08/2011 05:22 PM   Modules accepted: Orders

## 2011-12-09 ENCOUNTER — Telehealth: Payer: Self-pay | Admitting: *Deleted

## 2011-12-09 NOTE — Telephone Encounter (Signed)
Received call from pharmacist at [facility] requesting clarification of Androgel Rx faxed in yesterday. Directions say apply 5gm and apply 2 packets daily. Advised her per lab note that dose was just increased to 2 packets daily. Med list updated.

## 2011-12-16 ENCOUNTER — Encounter: Payer: Self-pay | Admitting: Pulmonary Disease

## 2011-12-16 ENCOUNTER — Ambulatory Visit (INDEPENDENT_AMBULATORY_CARE_PROVIDER_SITE_OTHER)
Admission: RE | Admit: 2011-12-16 | Discharge: 2011-12-16 | Disposition: A | Discharge status: Home / Self Care | Payer: Medicare Other | Source: Ambulatory Visit | Attending: Pulmonary Disease | Admitting: Pulmonary Disease

## 2011-12-16 ENCOUNTER — Ambulatory Visit (INDEPENDENT_AMBULATORY_CARE_PROVIDER_SITE_OTHER): Payer: Medicare Other | Admitting: Pulmonary Disease

## 2011-12-16 ENCOUNTER — Telehealth: Payer: Self-pay | Admitting: Internal Medicine

## 2011-12-16 VITALS — BP 108/66 | HR 70 | Temp 98.0°F | Ht 74.0 in | Wt 271.8 lb

## 2011-12-16 DIAGNOSIS — R0602 Shortness of breath: Secondary | ICD-10-CM

## 2011-12-16 DIAGNOSIS — R05 Cough: Secondary | ICD-10-CM

## 2011-12-16 DIAGNOSIS — E669 Obesity, unspecified: Secondary | ICD-10-CM

## 2011-12-16 DIAGNOSIS — G4733 Obstructive sleep apnea (adult) (pediatric): Secondary | ICD-10-CM

## 2011-12-16 DIAGNOSIS — F411 Generalized anxiety disorder: Secondary | ICD-10-CM

## 2011-12-16 DIAGNOSIS — K219 Gastro-esophageal reflux disease without esophagitis: Secondary | ICD-10-CM

## 2011-12-16 DIAGNOSIS — Z23 Encounter for immunization: Secondary | ICD-10-CM

## 2011-12-16 MED ORDER — PROMETHAZINE-CODEINE 6.25-10 MG/5ML PO SYRP: 5.0000 mL | ORAL_SOLUTION | Freq: Four times a day (QID) | ORAL | Status: DC | PRN

## 2011-12-16 MED ORDER — AMOXICILLIN-POT CLAVULANATE 875-125 MG PO TABS: 1.0000 | ORAL_TABLET | Freq: Two times a day (BID) | ORAL | Status: DC

## 2011-12-16 NOTE — Telephone Encounter (Signed)
please call as soon as possible re: DM  Pt states he does not have DM, my chart mess states his DM has improved, he want wording corrected to say "Your Blood Sugar has improved"  CB# [phone]

## 2011-12-16 NOTE — Telephone Encounter (Signed)
Spoke with patient, patient states he was told by Dr.Paz that he is NON-Diabetes. Patient states when he seen another doctor that doctor told him he could not rx him certain medications because he is diabetic and from his understanding he is not diabetic. Patient would like this wording changed on his last lab reults. Patient aware Dr.Paz 1/2 day on Thursday and I will forward this to him and his assistance for review tomorrow

## 2011-12-16 NOTE — Progress Notes (Addendum)
Subjective:    Patient ID: Ronald King, male    DOB: Oct 19, 1945, 66 y.o.   MRN: [id number]  HPI 66 y/o WM here for a follow up visit... Ronald King has a chronic cough x years and is dependent on codeine containing cough syrups... Ronald King was miserable when ENDAL HD went off the market and we had to substitute PHENERGAN EXPECTORANT w/ CODEINE - 1 tsp by mouth Q6H as needed for cough, limiting him to 1 pint per month...   ~  May 27, 2010:  Yearly check up> feeling some better since starting on Androgel 5gm/d for Low-T & Testos level has improved from 204 to 553... Ronald King continues on nasal regimen for sinuses but c/o tinnitus and decr hearing- Ronald King is rec to see ENT for further eval & we will refer;  continues on phen expec w/ codeine for his chr cough;  BP controlled on his 3 med regimen;  Lipids still look poor on Fibrate alone & Ronald King still refuses Statin & Lipid Clinic eval- "I can do it on diet alone";  Ronald King takes a number of vits & supplements... Ronald King sees Stage manager for Paxil & Klonopin Rx; and ?DrGibson [place] [facility]..  ~  November 26, 2010:  43mo ROV & Ronald King reports recent bronchitic episode improved after Augmentin rx & c/o fatigue- doesn't have the energy Ronald King feels Ronald King should have; Ronald King wants chol, vit d & testosterone levels checked; due for CXR as well...  see prob list below >>    Ronald King reports seeing his Neurologist, DrGibson in [place] for neuropathy in his feet (feet worse- numb all the time now) & they have tried several meds- Neurontin, Lyrica, one other (INTOL to these due to 'memory gaps' Ronald King says), & pt refuses Cymbalta> asked to keep me informed of his progress & try to get Neuro notes sent to me for our records.  ~  June 16, 2011:  2mo ROV & Ronald King mentioned that Ronald King starts on Medicare in June & wanted my recs for a primary care doctor;  Ronald King has OSA- on CPAP nightly, wt unchanged at 274# & we reviewed diet recs;  Chr cough managed w/ Phen Expect w/ Codeine & Ronald King is lim to 1pt per month- Ronald King is here for  refill Rx for the next 43mo;  BP controlled on his 3 meds- see below;  FLP remains poorly controlled on his diet + Tricor145> Ronald King now agrees to start Statin & rec Crestor20;  See prob list below>>    Ronald King saw his Neurologist- DrGibson in [place] 10/12 for f/u Neuropathy & for ?CTS, & Tinnitus (rec to f/u w/ his ENT)> DrGibson gives him Rx for Hydrocodone-Tylenol 2.5-500 Tid as needed #90 at a time w/ refills, and bilat wrist splints...    Ronald King reports that Ronald King still sees Psychiatrist DrSanders- on Klonopin 1mg  Bid and Paxil CR 25mg /d; we do not have any notes from them... LABS  3/13:  FLP- all parameters off on diet alone;  Chems- ok x BS=126 Creat=1.3;  CBC- wnl;  TSH=1.88;  PSA=1.73;  Testos=185 on Testim Q3d;  UA=clear.  ~  December 16, 2011:  43mo ROV & Ronald King has established primary care thru DrPaz & his notes are reviewed; Ronald King is c/o his chronic cough which is controlled w/ his chronic cough syrup (Phen expect w/ Cod- lim to 1pint per month) and vague intermittent variable CWP on Vicodin & Robaxin;  Ronald King denies palpit, SOB, edema, etc...     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    We reviewed prob list, meds, xrays and labs> see below for updates >> OK Flu shot today & refill his Augmentin & cough syrup... CXR 9/13 showed normal heart size, clear lungs, DJD spine... PFT 9/13 showed FVC=3.98 (76%), FEV1=3.46 (86%), %1sec=87, mid-flows=112% predicted...           Problem List:       Hx of SINUSITIS (ICD-473.9) - on NASONEX 2spBid + Ronald King requests Rx for AMOXICILLIN to keep on hand for travel... prev eval DrByers- "Ronald King wanted to do surg"... "my sinuses are crazy" but Ronald King refuses to consider the surg... also had dysphonia eval at [facility] in the 90's w/ Botox tried.  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 saw  DrClance 12/09- Ronald King felt upper airway resistance syndrome... doing better w/ new CPAP machine in 2010. ~  7/13:  Ronald King had f/u DrClance & doing well, no changes made, continue same CPAP...  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 Ronald King had an extensive eval in the past w/ GI, ENT, Pulm, etc... Ronald King uses PHEN EXPECT w/ CODEINE 1 tsp Q6H up to 1 pint/mo... ~  CXR 1/10 showed clear,  just min biapical pleural thickening... ~  2/11: states intol to Symbicort w/ HAs... try ADVAIR 250Bid... ~  CXR 2/11 showed clear lungs, NAD.Marland Kitchen. ~  CXR 9/12 showed chr bronchitic changes, NAD.Marland Kitchen. ~  CXR 9/13 showed normal heart size, clear lungs, DJD spine...  HYPERTENSION (ICD-401.9) - meds adjusted by DrCrenshaw on NORVASC 10mg /d, LISINOPRIL 20mg /d, & HCTZ 25mg - 1/2 tab daily...  ~  8/12:  BP= 134/82 & denies HA, fatigue, visual changes, CP, palipit, dizziness, syncope, dyspnea, edema, etc... ~  3/13:  BP= 124/76 & Ronald King denies CP, palpit, SOB, edema... ~  9/13:  BP= 110/66 & Ronald King remains asymptomatic...  DYSLIPIDEMIA (ICD-272.4) - on TRICOR 145mg /d but refuses Statin meds or Lipid Clinic referral... ~  FLP 1/10 showed TChol 161, TG 239, HDL 30, LDL 72... Ronald King wants to control w/ diet. ~  FLP 9/10 showed TChol 192, TG 426, HDL 30, LDL 80... rec> start [id number]. ~  FLP 2/11 showed TChol 173, TG 102, HDL 37, LDL 115... continue Tricor Rx + diet. ~  FLP 2/12 showed TChol 221, TG 249, HDL 31, LDL 140... Reminded to take med regularly & get wt down. ~  FLP 8/12 on Tric145 showed TChol 194, TG 169, HDL 32, LDL 128... Still not at goal, refuses statin med. ~  FLP 3/13 on Tric145 showed TChol 229, TG 177, HDL 35, LDL 140... rec to start CRESTOR20 + better low fat diet! ~  FLP 5/13 on Tric145+Cres20 showed TChol 151, TG 188, HDL 33, LDL 80... Continue same.  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 discussed diet + exercise  and the need for weight reduction both from his overall health status and his OSA... "I moderate what I eat". ~  weight 1/10 = 267# ~  weight 2/11 = 272#... discussed again! ~  Weight 2/12 = 276#... Ronald King is unconcerned! ~  Weight 8/12 = 274#... Reviewed diet & exercise needed. ~  Weight 3/13 = 274#... Ronald King is NOT trying. ~  Weight 9/13 =  272#  GERD (ICD-530.81) - on NEXIUM 40mg Bid & off the prev hs Reglan... s/p lap nissen 3/01 by DrMartin.... ~  EGD 11/05 showed prev Nissen, +gastritis... ~  EGD 6/10 by DrDBrodie showed functioning Nissen, post-op changes, sessile polyp in distal esoph= benign mucosa.  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 ~  f/u colonoscopy 6/10 by DrDBrodie showed negative x for int hems...  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  Finally started ANDROGEL 5gm packet Qd in 2012 ~  labs 1/10 showed PSA= 1.63 ~  labs 2/11 showed PSA= 1.65,  ~  Labs 1/12 showed Testosterone level = 160-205 ~  Labs 2/12 showed PSA = 1.63, Testosterone level = 553 ~  Labs 8/12 showed Testosterone level = 428... rec continue daily Androgel. ~  Labs 3/13 showed PSA= 1.73, Testosterone level = 185 & states Ronald King is using the Androgel Q3rd day, rec to incr to daily. ~  Follow up labs & med adjustments per DrPaz...  DEGENERATIVE JOINT DISEASE (ICD-715.90) - Ronald King had left knee arthroscopy in 2007 by DrDean to remove cartilage...  VITAMIN D DEFICIENCY (ICD-268.9) - on Vit D OTC 2000 u daily... ~  labs 1/10 showed Vit D level = 16... Vit D 2000 u daily started. ~  labs 2/11 showed Vit D level = 34... continue supplement. ~  Labs 8/12 showed Vit D level = 47... continue same.  HEADACHE (ICD-784.0) - Chr daily migraines in the past w/ prev HA eval by DrFreeman (Botox shots helped in the past)...  NEUROPATHY (ICD-355.9) - c/o burning in legs and eval from Neuro in [facility] & Rx w/ Neurontin +  Hydrocodone for pain... this may be part of a familial trait as several relatives have severe periph neuropathy (?hereditary syndrome)- we don't have notes from [facility] neurology. ~  Pt reports that Ronald King was intol to Neurontin, Lyrica, & one other ("memory gaps") & Ronald King refused Cymbalta trial... ~  3/13:  Reviewed w/ pt> Ronald King continues on VICODIN 2.5-500 from DrGibson up to Tid, 90/mo w/ refills from him...  ANXIETY (ICD-300.00) - on PAXIL CR 25mg /d & KLONOPIN 1mg  Bid as needed... they were prev perscribed by ?psychiatrist? but Ronald King notes "I've been on these for >85yrs"... Ronald King still sees ?DrSteven?DrSanders?, but we fill his perscriptions.  Health Maintenance - Ronald King takes ASA 81mg /d, & many Vits & supplements (states they help his RLS & leg cramps)... Ronald King is up to date on all vaccinations [facility] & we gave PNEUMOVAX 9/13 at age 39, and the 2013 Flu shot 9/13...   Past Surgical History  Procedure Date  . Laparoscopic nissen fundoplication 05/1999    Dr. Daphine Deutscher  . Knee arthroscopy 2007    Dr. August Saucer  . Tonsillectomy   . Nose surgery   . Anal fissure repair   . Hand fracture surgery     left    Outpatient Encounter Prescriptions as of 12/16/2011  Medication Sig Dispense Refill  . amLODipine (NORVASC) 10 MG tablet Take 1 tablet (10 mg total) by mouth daily.  90 tablet  3  . ascorbic acid (VITAMIN C) 500 MG tablet Take 1,000 mg by mouth daily.       Marland Kitchen aspirin 81 MG tablet Take 81 mg by mouth daily.        . Calcium Carbonate-Vitamin D (RA CALCIUM PLUS VITAMIN D) 600-400 MG-UNIT per tablet Take 1 tablet by mouth daily.        Marland Kitchen  Cholecalciferol (VITAMIN D) 2000 UNITS tablet Take 2,000 Units by mouth daily.        . clonazePAM (KLONOPIN) 1 MG tablet Take 1 mg by mouth 2 (two) times daily as needed.        . clotrimazole (LOTRIMIN) 1 % cream Apply topically 2 (two) times daily.        . Cyanocobalamin (VITAMIN B-12) 2500 MCG SUBL Place 1 tablet under the tongue daily.        Marland Kitchen  esomeprazole (NEXIUM) 40 MG capsule Take 1 capsule (40 mg total) by mouth 2 (two) times daily. 30 minutes before meals  180 capsule  3  . fenofibrate (TRICOR) 145 MG tablet Take 1 tablet (145 mg total) by mouth daily.  90 tablet  3  . Homeopathic Products (ZICAM COLD REMEDY) LIQD Take by mouth as needed.        . hydrochlorothiazide (HYDRODIURIL) 25 MG tablet Take 1/2 tablet by mouth daily  45 tablet  3  . HYDROcodone-acetaminophen (VICODIN) 2.5-500 MG per tablet Take 1 tablet by mouth every 6 (six) hours as needed.        Marland Kitchen lisinopril (PRINIVIL,ZESTRIL) 20 MG tablet Take 1 tablet (20 mg total) by mouth daily.  90 tablet  3  . methocarbamol (ROBAXIN) 750 MG tablet Take 750 mg by mouth 3 (three) times daily as needed.       . mometasone (NASONEX) 50 MCG/ACT nasal spray Place 2 sprays into the nose 2 (two) times daily.  51 g  3  . Multiple Vitamins-Minerals (CENTRUM SILVER PO) Take 1 tablet by mouth daily.        Marland Kitchen PARoxetine (PAXIL-CR) 25 MG 24 hr tablet Take 25 mg by mouth every morning.        . promethazine-codeine (PHENERGAN WITH CODEINE) 6.25-10 MG/5ML syrup Take 5 mLs by mouth every 6 (six) hours as needed.  120 mL  5  . Pyridoxine HCl (VITAMIN B-6 CR) 200 MG TBCR Take 1 tablet by mouth daily.        . rosuvastatin (CRESTOR) 20 MG tablet Take 1 tablet (20 mg total) by mouth daily.  90 tablet  3  . testosterone (ANDROGEL) 50 MG/5GM GEL Apply 10 g topically daily.        Allergies  Allergen Reactions  . Cefuroxime Axetil     REACTION: unsure of reaction  . Montelukast Sodium     REACTION: unsure of reaction    Current Medications, Allergies, Past Medical History, Past Surgical History, Family History, and Social History were reviewed in [employer] record.   Review of Systems        See HPI - all other systems neg except as noted...  The patient complains of dyspnea on exertion.  The patient denies anorexia, fever, weight loss, weight gain, vision loss,  decreased hearing, hoarseness, chest pain, syncope, peripheral edema, prolonged cough, headaches, hemoptysis, abdominal pain, melena, hematochezia, severe indigestion/heartburn, hematuria, incontinence, muscle weakness, suspicious skin lesions, transient blindness, difficulty walking, depression, unusual weight change, abnormal bleeding, enlarged lymph nodes, and angioedema.     Objective:   Physical Exam      WD, Obese, 66 y/o WM in NAD... GENERAL:  Alert & oriented; pleasant & cooperative... HEENT:  [place]/AT, EOM-wnl, PERRLA, EACs-clear, TMs-wnl, NOSE-clear, THROAT-clear & wnl. NECK:  Supple w/ fairROM; no JVD; normal carotid impulses w/o bruits; no thyromegaly or nodules palpated; no lymphadenopathy. CHEST:  Clear to P & A; without wheezes/ rales/ or rhonchi; Ronald King has an intermittent  dry irritative cough... HEART:  Regular Rhythm; without murmurs/ rubs/ or gallops. ABDOMEN:  Obese, soft & nontender; normal bowel sounds; no organomegaly or masses detected. EXT: without deformities, mild arthritic changes; no varicose veins/ venous insuffic/ or edema. NEURO:  CN's intact; motor testing normal; no focal neuro deficits DERM:  No lesions noted; no rash etc...  RADIOLOGY DATA:  Reviewed in the EPIC EMR & discussed w/ the patient...  LABORATORY DATA:  Reviewed in the EPIC EMR & discussed w/ the patient...   Assessment & Plan:    OSA>  Ronald King uses CPAP nightly, continue same...  Chronic Cough>  Ronald King is quite dependent on the Phen expect w/ codeine, limited to one pint per month...  HBP>  Controlled on CCB, ACE, Diuretic; continue same; Ronald King understands that substantial wt reduction would help w/ BP control & meds.  DYSLIPIDEMIA>  On Tricor + diet & not at goal; Ronald King has regularly refused Statin Rx but now agrees to try CRESTOR 20mg /d...  Obesity>  We reviewed diet & exercise but Ronald King has been noncompliant w/ both...  GI> GERD, IBS, Hems>  Stable on NexiumBid, etc...  GU> BPH, ED, Hypogonadism>  Prev  eval by DrPeterson, on ANDROGEL 5gm daily w/ good testosterone level if her sticks w/ it daily!  DJD>  Followed by DrDean w/ prev left knee arthroscopy...  Vit D Defic>  On OTC Vit D supplementation & ok...  NEUROPATHY>  Followed by DrGibson in [place] & intol to Neurontin/ Lyrica, etc; on VICODIN 2.5-500 Tid per Neurology...  ANXIETY>  ?Ronald King still sees Psychiatrist, but we write Rx for Paxil & Klonopin (?)   Patient's Medications  New Prescriptions   AMOXICILLIN-CLAVULANATE (AUGMENTIN) 875-125 MG PER TABLET    Take 1 tablet by mouth 2 (two) times daily.  Previous Medications   AMLODIPINE (NORVASC) 10 MG TABLET    Take 1 tablet (10 mg total) by mouth daily.   ASCORBIC ACID (VITAMIN C) 500 MG TABLET    Take 1,000 mg by mouth daily.    ASPIRIN 81 MG TABLET    Take 81 mg by mouth daily.     CALCIUM CARBONATE-VITAMIN D (RA CALCIUM PLUS VITAMIN D) 600-400 MG-UNIT PER TABLET    Take 1 tablet by mouth daily.     CHOLECALCIFEROL (VITAMIN D) 2000 UNITS TABLET    Take 2,000 Units by mouth daily.     CLONAZEPAM (KLONOPIN) 1 MG TABLET    Take 1 mg by mouth 2 (two) times daily as needed.     CLOTRIMAZOLE (LOTRIMIN) 1 % CREAM    Apply topically 2 (two) times daily.     CYANOCOBALAMIN (VITAMIN B-12) 2500 MCG SUBL    Place 1 tablet under the tongue daily.     ESOMEPRAZOLE (NEXIUM) 40 MG CAPSULE    Take 1 capsule (40 mg total) by mouth 2 (two) times daily. 30 minutes before meals   FENOFIBRATE (TRICOR) 145 MG TABLET    Take 1 tablet (145 mg total) by mouth daily.   HOMEOPATHIC PRODUCTS (ZICAM COLD REMEDY) LIQD    Take by mouth as needed.     HYDROCHLOROTHIAZIDE (HYDRODIURIL) 25 MG TABLET    Take 1/2 tablet by mouth daily   HYDROCODONE-ACETAMINOPHEN (VICODIN) 2.5-500 MG PER TABLET    Take 1 tablet by mouth every 6 (six) hours as needed.     LISINOPRIL (PRINIVIL,ZESTRIL) 20 MG TABLET    Take 1 tablet (20 mg total) by mouth daily.   METHOCARBAMOL (ROBAXIN) 750 MG TABLET  Take 750 mg by mouth 3 (three) times  daily as needed.    MOMETASONE (NASONEX) 50 MCG/ACT NASAL SPRAY    Place 2 sprays into the nose 2 (two) times daily.   MULTIPLE VITAMINS-MINERALS (CENTRUM SILVER PO)    Take 1 tablet by mouth daily.     PAROXETINE (PAXIL-CR) 25 MG 24 HR TABLET    Take 25 mg by mouth every morning.     PYRIDOXINE HCL (VITAMIN B-6 CR) 200 MG TBCR    Take 1 tablet by mouth daily.     ROSUVASTATIN (CRESTOR) 20 MG TABLET    Take 1 tablet (20 mg total) by mouth daily.   TESTOSTERONE (ANDROGEL) 50 MG/5GM GEL    Apply 10 g topically daily.  Modified Medications   Modified Medication Previous Medication   PROMETHAZINE-CODEINE (PHENERGAN WITH CODEINE) 6.25-10 MG/5ML SYRUP promethazine-codeine (PHENERGAN WITH CODEINE) 6.25-10 MG/5ML syrup      Take 5 mLs by mouth every 6 (six) hours as needed.    Take 5 mLs by mouth every 6 (six) hours as needed.  Discontinued Medications   No medications on file

## 2011-12-16 NOTE — Patient Instructions (Addendum)
Today we updated your med list in our EPIC system...    Continue your current medications the same...    We refilled your meds per request...  Today we did your follow up PFT & CXR...    We will call you w/ the results...  Let's get on track w/ our diet 7 exercise program...    The goal is to lose 20 lbs over the next 6 months...  Call for any questions.Marland KitchenMarland Kitchen

## 2011-12-17 NOTE — Telephone Encounter (Signed)
Spoke with pt & he states that he would just like to have "diabetes" removed off of his chat.

## 2011-12-17 NOTE — Telephone Encounter (Signed)
We can change the wording to "hypreglycemia". Either way, his sugar is not completely normal. We can fax the  last hemoglobin A1c to the other physician --- if the patient is ok with that --- so this other MD can make his own judgment.

## 2011-12-17 NOTE — Telephone Encounter (Signed)
I have to acknowledge his medical issues in the chart, I switch the diagnosis to hyperglycemia

## 2011-12-18 ENCOUNTER — Encounter: Payer: Self-pay | Admitting: Internal Medicine

## 2011-12-27 ENCOUNTER — Other Ambulatory Visit (INDEPENDENT_AMBULATORY_CARE_PROVIDER_SITE_OTHER): Payer: Medicare Other

## 2011-12-27 DIAGNOSIS — E785 Hyperlipidemia, unspecified: Secondary | ICD-10-CM

## 2011-12-27 DIAGNOSIS — I10 Essential (primary) hypertension: Secondary | ICD-10-CM

## 2011-12-27 DIAGNOSIS — R7309 Other abnormal glucose: Secondary | ICD-10-CM

## 2011-12-27 DIAGNOSIS — E291 Testicular hypofunction: Secondary | ICD-10-CM

## 2011-12-27 DIAGNOSIS — R739 Hyperglycemia, unspecified: Secondary | ICD-10-CM

## 2011-12-27 LAB — HEMOGLOBIN A1C: Hgb A1c MFr Bld: 6.1 % (ref 4.6–6.5)

## 2012-01-03 ENCOUNTER — Telehealth: Payer: Self-pay

## 2012-01-03 NOTE — Telephone Encounter (Signed)
Spoke to pt. Pt stated not able to view results on my chart so I mailed pt out a copy of lab results.         MW

## 2012-03-09 ENCOUNTER — Telehealth: Payer: Self-pay | Admitting: Pulmonary Disease

## 2012-03-09 MED ORDER — AZITHROMYCIN 250 MG PO TABS: ORAL_TABLET | ORAL | Status: DC

## 2012-03-09 NOTE — Telephone Encounter (Signed)
Per SN---ok to send in zpak #1  Take as directed with no refills.  Called and spoke with pt and he is aware of meds sent to the pharmacy.

## 2012-03-09 NOTE — Telephone Encounter (Signed)
I spoke with pt and he c/o cough w/ grey phlem x weekend. He had fever on Sunday but it broke. No fever now, no sweats/chills/nausea/vomiting, chest tx/wheezing. Pt is requesting ABX. Please advise SN thanks Last OV 12/16/11 Pending OV 06/15/12 Allergies  Allergen Reactions  . Cefuroxime Axetil     REACTION: unsure of reaction  . Montelukast Sodium     REACTION: unsure of reaction

## 2012-03-09 NOTE — Telephone Encounter (Signed)
Pt called back again. Says he went to pharmacy already and nothing had been called in for him. I explained that he needs to wait for nurse to call him back. He wants this taken care of soon "please" Hazel Sams

## 2012-03-17 ENCOUNTER — Telehealth: Payer: Self-pay | Admitting: Pulmonary Disease

## 2012-03-17 NOTE — Telephone Encounter (Signed)
LMTCB x 1 for Ronald King.

## 2012-03-17 NOTE — Telephone Encounter (Signed)
Spoke with tiffany and she is needing this form signed by SN and faxed back to her in order to ship these cpap supplies out.  i explained to tiffany that i will see if i can locate this form and get this signed by SN and fax this back.

## 2012-03-20 NOTE — Telephone Encounter (Signed)
Tiffany calling again in ref to previous msg can be reached at [phone].Raylene Everts

## 2012-03-20 NOTE — Telephone Encounter (Signed)
Called [facility] and they stated that tiffany used the old order to get the supplies sent out.  They can use the last ov note and this has been faxed to [phone].  Nothing futher is needed.

## 2012-04-27 ENCOUNTER — Other Ambulatory Visit: Payer: Self-pay | Admitting: Pulmonary Disease

## 2012-05-01 ENCOUNTER — Telehealth: Payer: Self-pay | Admitting: Pulmonary Disease

## 2012-05-01 MED ORDER — AMOXICILLIN-POT CLAVULANATE 875-125 MG PO TABS: 1.0000 | ORAL_TABLET | Freq: Two times a day (BID) | ORAL | Status: DC

## 2012-05-01 NOTE — Telephone Encounter (Signed)
Spoke with pt requesting rx for augmentin 875mg . Pt states he is developing a cough Which usually turns in to bronchitis.and is requesting an rx to hold just in case. Allergies  Allergen Reactions  . Cefuroxime Axetil     REACTION: unsure of reaction  . Montelukast Sodium     REACTION: unsure of reaction   Dr Kriste Basque please advise  Thank you

## 2012-05-01 NOTE — Telephone Encounter (Signed)
rx for the augmentin has been sent to the pharmacy.  Pt is aware.

## 2012-06-01 ENCOUNTER — Encounter: Payer: Self-pay | Admitting: Internal Medicine

## 2012-06-01 ENCOUNTER — Ambulatory Visit (INDEPENDENT_AMBULATORY_CARE_PROVIDER_SITE_OTHER): Payer: Medicare Other | Admitting: Internal Medicine

## 2012-06-01 VITALS — BP 122/80 | HR 80 | Temp 98.5°F | Ht 73.5 in | Wt 269.0 lb

## 2012-06-01 DIAGNOSIS — F411 Generalized anxiety disorder: Secondary | ICD-10-CM

## 2012-06-01 DIAGNOSIS — Z0000 Encounter for general adult medical examination without abnormal findings: Secondary | ICD-10-CM

## 2012-06-01 DIAGNOSIS — E291 Testicular hypofunction: Secondary | ICD-10-CM

## 2012-06-01 DIAGNOSIS — I10 Essential (primary) hypertension: Secondary | ICD-10-CM

## 2012-06-01 DIAGNOSIS — E785 Hyperlipidemia, unspecified: Secondary | ICD-10-CM

## 2012-06-01 DIAGNOSIS — R739 Hyperglycemia, unspecified: Secondary | ICD-10-CM

## 2012-06-01 DIAGNOSIS — R7309 Other abnormal glucose: Secondary | ICD-10-CM

## 2012-06-01 LAB — CBC WITH DIFFERENTIAL/PLATELET
Basophils Relative: 0.6 % (ref 0.0–3.0)
Eosinophils Absolute: 0.2 10*3/uL (ref 0.0–0.7)
Eosinophils Relative: 3.1 % (ref 0.0–5.0)
HCT: 44.4 % (ref 39.0–52.0)
Hemoglobin: 15 g/dL (ref 13.0–17.0)
MCHC: 33.8 g/dL (ref 30.0–36.0)
MCV: 89.2 fl (ref 78.0–100.0)
Monocytes Absolute: 0.6 10*3/uL (ref 0.1–1.0)
Neutro Abs: 4.9 10*3/uL (ref 1.4–7.7)
RBC: 4.98 Mil/uL (ref 4.22–5.81)
WBC: 7.2 10*3/uL (ref 4.5–10.5)

## 2012-06-01 LAB — BASIC METABOLIC PANEL
CO2: 29 mEq/L (ref 19–32)
Chloride: 105 mEq/L (ref 96–112)
Potassium: 4.9 mEq/L (ref 3.5–5.1)
Sodium: 142 mEq/L (ref 135–145)

## 2012-06-01 LAB — LIPID PANEL
Cholesterol: 147 mg/dL (ref 0–200)
LDL Cholesterol: 96 mg/dL (ref 0–99)
Total CHOL/HDL Ratio: 5
VLDL: 21.8 mg/dL (ref 0.0–40.0)

## 2012-06-01 LAB — ALT: ALT: 23 U/L (ref 0–53)

## 2012-06-01 LAB — AST: AST: 24 U/L (ref 0–37)

## 2012-06-01 MED ORDER — ZOSTER VACCINE LIVE 19400 UNT/0.65ML ~~LOC~~ SOLR: 0.6500 mL | Freq: Once | SUBCUTANEOUS | Status: DC

## 2012-06-01 NOTE — Assessment & Plan Note (Signed)
Self d/c ACEi, see HPI. Labs

## 2012-06-01 NOTE — Progress Notes (Signed)
  Subjective:    Patient ID: Ronald King, male    DOB: 1945-12-16, 67 y.o.   MRN: [id number]  HPI  Here for Medicare AWV: 1. Risk factors based on Past M, S, F history: reviewed 2. Physical Activities:  Gym x 3/week, some walking 3. Depression/mood:  (-) screening, symptoms well controlled with clonazepam and Paxil 4. Hearing: some tinnitus, some hearing loss, saw ENT last year 5. ADL's:  independent 6. Fall Risk: low risk, see instructions 7. home Safety: does feel safe at home  8. Height, weight, &visual acuity: see VS, uses glasses, sees eye doctor regulalrly 9. Counseling: provided 10. Labs ordered based on risk factors: if needed  11. Referral Coordination: if needed 12.  Care Plan, see assessment and plan  13.   Cognitive Assessment: Motor skills and cognition seems intact.  In addition, today we discussed the following: Neuropathy, on hydrocodone when necessary. High cholesterol, good medication compliance. Hypertension, she self discontinue vicinities because his ambulatory BPs are great. On amlodipine and HCTZ only. Diabetes, on diet control only Hypogonadism, good compliance with testosterone. Likes a testosterone checked   Past Medical History: Diabetes, DX 07-2011   Hypertension Hyperlipidemia Chronic bronchitis, chronic cough Sleep apnea hypogonadism anxiety GI --GERD, status post Nissen fundoplication --History of anemia   --Esophageal polyps --GI bleed hemorrhoids Neuropathy-- on vicodin Vit D def   DJD  Past Surgical History: S/P lap nissen fundoplication by Dr Daphine Deutscher 3/01 S/P left knee arthroscopy in 2007 by Dr August Saucer Tonsillectomy Nasal Surgery Anal fissure and cyst removal (pilonidal?) Left hand fracture/surgery  Social History: Married- 47 yrs 1 Daughter- ?hx Crohn's disease  Tobacco--quit 1967, only smoked 50yrs ETOH-- 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 Cerebral aneurysm Father   . Alcohol abuse Father   . Neuropathy Mother   . Migraines Brother     several  . Diabetes Maternal Uncle   . Heart disease Brother     CABG at age 78  . Colon cancer Neg Hx   . Prostate cancer Neg Hx      Review of Systems No chest pain or shortness or breath No nausea, vomiting, diarrhea. No blood in the stools. No dysuria, gross hematuria or difficulty urinating. Some shoulder pain bilaterally. Some discomfort at the right ear.     Objective:   Physical Exam BP 122/80  Pulse 80  Temp(Src) 98.5 F (36.9 C) (Oral)  Ht 6' 1.5" (1.867 m)  Wt 269 lb (122.018 kg)  BMI 35.01 kg/m2  SpO2 95%  General -- alert, well-developed    Neck --no thyromegaly Lungs -- normal respiratory effort, no intercostal retractions, no accessory muscle use, and normal breath sounds.   Heart-- normal rate, regular rhythm, no murmur, and no gallop.   Abdomen--soft, non-tender, no distention, no masses, no HSM, no guarding, and no rigidity.   Extremities-- trace  pretibial edema bilaterally Rectal-- No external abnormalities noted. Normal sphincter tone. No rectal masses or tenderness. no  Stool found Prostate:  Prostate gland firm and smooth, no enlargement, nodularity, tenderness, mass, asymmetry or induration. Neurologic-- alert & oriented X3 and strength normal in all extremities. Psych-- Cognition and judgment appear intact. Alert and cooperative with normal attention span and concentration.  not anxious appearing and not depressed appearing.      Assessment & Plan:

## 2012-06-01 NOTE — Assessment & Plan Note (Signed)
Good compliance with medication. Requesta testosterone level. Will also check a PSA.

## 2012-06-01 NOTE — Patient Instructions (Signed)

## 2012-06-01 NOTE — Assessment & Plan Note (Signed)
Due for labs

## 2012-06-01 NOTE — Assessment & Plan Note (Signed)
Well controlled 

## 2012-06-01 NOTE — Assessment & Plan Note (Signed)
Due for labs, good med compliance  

## 2012-06-01 NOTE — Assessment & Plan Note (Addendum)
Td ~ 5 years Pneumonia shot 2013 zostavax-- benefits discussed , Rx provided  Cscope 08-2008, neg, next in 10 years, was rec hemocult q 2-3 years, IFOB provided  rec to stay active

## 2012-06-04 LAB — TESTOSTERONE, FREE, TOTAL, SHBG
Sex Hormone Binding: 23 nmol/L (ref 13–71)
Testosterone, Free: 49.2 pg/mL (ref 47.0–244.0)

## 2012-06-15 ENCOUNTER — Ambulatory Visit (INDEPENDENT_AMBULATORY_CARE_PROVIDER_SITE_OTHER): Payer: Medicare Other | Admitting: Pulmonary Disease

## 2012-06-15 ENCOUNTER — Other Ambulatory Visit: Payer: Self-pay | Admitting: Internal Medicine

## 2012-06-15 ENCOUNTER — Encounter: Payer: Self-pay | Admitting: Pulmonary Disease

## 2012-06-15 ENCOUNTER — Other Ambulatory Visit: Payer: Self-pay | Admitting: Pulmonary Disease

## 2012-06-15 VITALS — BP 126/82 | HR 75 | Temp 97.1°F | Ht 74.0 in | Wt 274.4 lb

## 2012-06-15 DIAGNOSIS — J329 Chronic sinusitis, unspecified: Secondary | ICD-10-CM | POA: Insufficient documentation

## 2012-06-15 DIAGNOSIS — R0602 Shortness of breath: Secondary | ICD-10-CM

## 2012-06-15 DIAGNOSIS — K219 Gastro-esophageal reflux disease without esophagitis: Secondary | ICD-10-CM

## 2012-06-15 DIAGNOSIS — R05 Cough: Secondary | ICD-10-CM

## 2012-06-15 DIAGNOSIS — G4733 Obstructive sleep apnea (adult) (pediatric): Secondary | ICD-10-CM

## 2012-06-15 DIAGNOSIS — R1319 Other dysphagia: Secondary | ICD-10-CM

## 2012-06-15 MED ORDER — AMOXICILLIN-POT CLAVULANATE 875-125 MG PO TABS: 1.0000 | ORAL_TABLET | Freq: Two times a day (BID) | ORAL | Status: DC

## 2012-06-15 MED ORDER — PROMETHAZINE-CODEINE 6.25-10 MG/5ML PO SYRP: 5.0000 mL | ORAL_SOLUTION | Freq: Four times a day (QID) | ORAL | Status: DC | PRN

## 2012-06-15 NOTE — Progress Notes (Signed)
Subjective:    Patient ID: Ronald King, male    DOB: May 25, 1945, 67 y.o.   MRN: [id number]  HPI 67 y/o WM here for a follow up visit... he has a chronic cough x years and is dependent on codeine containing cough syrups... he was miserable when ENDAL HD went off the market and we had to substitute PHENERGAN EXPECTORANT w/ CODEINE - 1 tsp by mouth Q6H as needed for cough, limiting him to 1 pint per month... His new Primary Care doctor is DrPaz.  ~  May 27, 2010:  Yearly check up> feeling some better since starting on Androgel 5gm/d for Low-T & Testos level has improved from 204 to 553... he continues on nasal regimen for sinuses but c/o tinnitus and decr hearing- he is rec to see ENT for further eval & we will refer;  continues on phen expec w/ codeine for his chr cough;  BP controlled on his 3 med regimen;  Lipids still look poor on Fibrate alone & he still refuses Statin & Lipid Clinic eval- "I can do it on diet alone";  he takes a number of vits & supplements... he sees Stage manager for Paxil & Klonopin Rx; and ?DrGibson [place] [facility]..  ~  November 26, 2010:  9mo ROV & he reports recent bronchitic episode improved after Augmentin rx & c/o fatigue- doesn't have the energy he feels he should have; he wants chol, vit d & testosterone levels checked; due for CXR as well...  see prob list below >>    He reports seeing his Neurologist, DrGibson in [place] for neuropathy in his feet (feet worse- numb all the time now) & they have tried several meds- Neurontin, Lyrica, one other (INTOL to these due to 'memory gaps' he says), & pt refuses Cymbalta> asked to keep me informed of his progress & try to get Neuro notes sent to me for our records.  ~  June 16, 2011:  35mo ROV & Ronald King mentioned that he starts on Medicare in June & wanted my recs for a primary care doctor;  He has OSA- on CPAP nightly, wt unchanged at 274# & we reviewed diet recs;  Chr cough managed w/ Phen Expect w/ Codeine & he is  lim to 1pt per month- he is here for refill Rx for the next 9mo;  BP controlled on his 3 meds- see below;  FLP remains poorly controlled on his diet + Tricor145> he now agrees to start Statin & rec Crestor20;  See prob list below>>    He saw his Neurologist- DrGibson in [place] 10/12 for f/u Neuropathy & for ?CTS, & Tinnitus (rec to f/u w/ his ENT)> DrGibson gives him Rx for Hydrocodone-Tylenol 2.5-500 Tid as needed #90 at a time w/ refills, and bilat wrist splints...    He reports that he still sees Psychiatrist DrSanders- on Klonopin 1mg  Bid and Paxil CR 25mg /d; we do not have any notes from them... LABS  3/13:  FLP- all parameters off on diet alone;  Chems- ok x BS=126 Creat=1.3;  CBC- wnl;  TSH=1.88;  PSA=1.73;  Testos=185 on Testim Q3d;  UA=clear.  ~  December 16, 2011:  9mo ROV & Ronald King has established primary care thru DrPaz & his notes are reviewed; He is c/o his chronic cough which is controlled w/ his chronic cough syrup (Phen expect w/ Cod- lim to 1pint per month) and vague intermittent variable CWP on Vicodin & Robaxin;  He denies palpit, SOB, edema, etc..Marland Kitchen  He saw DrClance 7/13 for Sleep f/u> Hx upper airway resistance syndrome & responded very well to CPAP despite a fairly neg Sleep Study in 1999; he continues on CPAP nightly & resting well, excellent daytime alertness, etc...    We reviewed prob list, meds, xrays and labs> see below for updates >> OK Flu shot today & refill his Augmentin & cough syrup... CXR 9/13 showed normal heart size, clear lungs, DJD spine... PFT 9/13 showed FVC=3.98 (76%), FEV1=3.46 (86%), %1sec=87, mid-flows=112% predicted...  ~  June 15, 2012:  38mo ROV & Ronald King CC is fatigue, tired, no energy despite Androgel Rx and he saw DrPaz several weeks ago- Medicare annual wellness visit & review of medical problems...    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    Chronic Cough>  managed for yrs w/ cough syrup, currently Phen Expect w/ Codeine limited to 1 pt per month; he denies sput, hemoptysis, SOB, CP, etc...    Hx upper airway resistance syndrome> followed by DrClance & seen 7/13- responded very well to CPAP despite a fairly neg Sleep Study in 1999; he continues on CPAP nightly & resting well, excellent daytime alertness, etc... We reviewed prob list, meds, xrays and labs> see below for updates >>           Problem List:       Hx of SINUSITIS (ICD-473.9) - on NASONEX 2spBid + he requests Rx for AMOXICILLIN to keep on hand for travel... prev eval DrByers- "he wanted to do surg"... "my sinuses are crazy" but he refuses to consider the surg... also had dysphonia eval at [facility] in the 90's w/ Botox tried.  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 saw DrClance 12/09- he felt upper airway resistance syndrome... doing better w/ new CPAP machine in 2010. ~  7/13:  He had f/u DrClance & doing well, no changes made, continue same CPAP...  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 he had an extensive eval in the past w/ GI, ENT, Pulm, etc... he uses PHEN EXPECT w/ CODEINE 1 tsp Q6H up to 1 pint/mo... ~  CXR 1/10 showed clear,  just min biapical pleural thickening... ~  2/11: states intol to Symbicort w/ HAs... try ADVAIR 250Bid... ~  CXR 2/11 showed clear lungs, NAD.Marland Kitchen. ~  CXR 9/12 showed chr bronchitic changes, NAD.Marland Kitchen. ~  CXR 9/13 showed normal heart size, clear lungs, DJD spine...  HYPERTENSION (ICD-401.9) - meds adjusted by DrCrenshaw on NORVASC 10mg /d & HCTZ 25mg - 1/2 tab daily...  ~  8/12:  BP= 134/82 & denies HA, fatigue, visual changes, CP, palipit, dizziness, syncope, dyspnea, edema, etc... ~  3/13:  BP= 124/76 & he denies CP, palpit, SOB, edema... ~  9/13:  BP= 110/66 & he remains asymptomatic... ~  He has established Primary Care f/u w/ DrPaz...  DYSLIPIDEMIA (ICD-272.4) - on TRICOR 145mg /d but refuses Statin meds or Lipid Clinic referral... ~   FLP 1/10 showed TChol 161, TG 239, HDL 30, LDL 72... he wants to control w/ diet. ~  FLP 9/10 showed TChol 192, TG 426, HDL 30, LDL 80... rec> start [id number]. ~  FLP 2/11 showed TChol 173, TG 102, HDL 37, LDL 115... continue Tricor Rx + diet. ~  FLP 2/12 showed TChol 221, TG 249, HDL 31, LDL 140... Reminded to take med regularly & get wt down. ~  FLP 8/12 on Tric145 showed TChol 194, TG 169,  HDL 32, LDL 128... Still not at goal, refuses statin med. ~  FLP 3/13 on Tric145 showed TChol 229, TG 177, HDL 35, LDL 140... rec to start CRESTOR20 + better low fat diet! ~  FLP 5/13 on Tric145+Cres20 showed TChol 151, TG 188, HDL 33, LDL 80... Continue same. ~  Lipids followed by DrPaz...  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 discussed diet + exercise and the need for weight reduction both from his overall health status and his OSA... "I moderate what I eat". ~  weight 1/10 = 267# ~  weight 2/11 = 272#... discussed again! ~  Weight 2/12 = 276#... He is unconcerned! ~  Weight 8/12 = 274#... Reviewed diet & exercise needed. ~  Weight 3/13 = 274#... He is NOT trying. ~  Weight 9/13 = 272# ~  Weight 3/14 = 274#  GERD (ICD-530.81) - on NEXIUM 40mg Bid & off the prev hs Reglan... s/p lap nissen 3/01 by DrMartin.... ~  EGD 11/05 showed prev Nissen, +gastritis... ~  EGD 6/10 by DrDBrodie showed functioning Nissen, post-op changes, sessile polyp in distal esoph= benign mucosa.  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 ~  f/u colonoscopy 6/10 by DrDBrodie showed negative x for int hems...  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  Finally started ANDROGEL 5gm packet Qd in 2012 ~  labs 1/10 showed PSA= 1.63 ~  labs 2/11 showed PSA= 1.65,  ~  Labs 1/12 showed Testosterone level = 160-205 ~  Labs  2/12 showed PSA = 1.63, Testosterone level = 553 ~  Labs 8/12 showed Testosterone level = 428... rec continue daily Androgel. ~  Labs 3/13 showed PSA= 1.73, Testosterone level = 185 & states he is using the Androgel Q3rd day, rec to incr to daily. ~  Follow up labs & med adjustments per DrPaz...  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  VITAMIN D DEFICIENCY (ICD-268.9) - on Vit D OTC 2000 u daily... ~  labs 1/10 showed Vit D level = 16... Vit D 2000 u daily started. ~  labs 2/11 showed Vit D level = 34... continue supplement. ~  Labs 8/12 showed Vit D level = 47... continue same.  HEADACHE (ICD-784.0) - Chr daily migraines in the past w/ prev HA eval by DrFreeman (Botox shots helped in the past)...  NEUROPATHY (ICD-355.9) - c/o burning in legs and eval from Neuro in [facility] & Rx w/ Neurontin + Hydrocodone for pain... this may be part of a familial trait as several relatives have severe periph neuropathy (?hereditary syndrome)- we don't have notes from [facility] neurology. ~  Pt reports that he was intol to Neurontin, Lyrica, & one other ("memory gaps") & he refused Cymbalta trial... ~  3/13:  Reviewed w/ pt> he continues on VICODIN 2.5-500 from DrGibson up to Tid, 90/mo w/ refills from him...  ANXIETY (ICD-300.00) - on PAXIL CR 25mg /d & KLONOPIN 1mg  Bid as needed... they were prev perscribed by ?psychiatrist? but he notes "I've been on these for >5yrs"... he still sees ?DrSteven?DrSanders?, but we fill his perscriptions.   Past Surgical History  Procedure Laterality Date  . Laparoscopic nissen fundoplication  05/1999    Dr. Daphine Deutscher  . Knee arthroscopy  2007  Dr. August Saucer  . Tonsillectomy    . Nose surgery    . Anal fissure repair    . Hand fracture surgery      left    Outpatient Encounter Prescriptions as of 06/15/2012  Medication Sig Dispense Refill  . amLODipine (NORVASC) 10 MG tablet Take 1 tablet (10 mg  total) by mouth daily.  90 tablet  3  . ascorbic acid (VITAMIN C) 500 MG tablet Take 1,000 mg by mouth daily.       Marland Kitchen aspirin 81 MG tablet Take 81 mg by mouth daily.        . Calcium Carbonate-Vitamin D (RA CALCIUM PLUS VITAMIN D) 600-400 MG-UNIT per tablet Take 1 tablet by mouth daily.        . Cholecalciferol (VITAMIN D) 2000 UNITS tablet Take 2,000 Units by mouth daily.        . clonazePAM (KLONOPIN) 1 MG tablet Take 1 mg by mouth 2 (two) times daily as needed.        . clotrimazole (LOTRIMIN) 1 % cream Apply topically 2 (two) times daily.        . Coenzyme Q10 (CO Q 10 PO) Take by mouth.      . Cyanocobalamin (VITAMIN B-12) 2500 MCG SUBL Place 1 tablet under the tongue daily.        Marland Kitchen esomeprazole (NEXIUM) 40 MG capsule Take 1 capsule (40 mg total) by mouth 2 (two) times daily. 30 minutes before meals  180 capsule  3  . fenofibrate (TRICOR) 145 MG tablet Take 1 tablet (145 mg total) by mouth daily.  90 tablet  3  . Homeopathic Products (ZICAM COLD REMEDY) LIQD Take by mouth as needed.        . hydrochlorothiazide (HYDRODIURIL) 25 MG tablet Take 1/2 tablet by mouth daily  45 tablet  3  . HYDROcodone-acetaminophen (VICODIN) 2.5-500 MG per tablet Take 1 tablet by mouth every 6 (six) hours as needed.        . mometasone (NASONEX) 50 MCG/ACT nasal spray Place 2 sprays into the nose 2 (two) times daily.  51 g  3  . Multiple Vitamins-Minerals (CENTRUM SILVER PO) Take 1 tablet by mouth daily.        Marland Kitchen PARoxetine (PAXIL-CR) 25 MG 24 hr tablet Take 25 mg by mouth every morning.        . promethazine-codeine (PHENERGAN WITH CODEINE) 6.25-10 MG/5ML syrup Take 5 mLs by mouth every 6 (six) hours as needed.  180 mL  5  . Pyridoxine HCl (VITAMIN B-6 CR) 200 MG TBCR Take 1 tablet by mouth daily.        . rosuvastatin (CRESTOR) 20 MG tablet Take 1 tablet (20 mg total) by mouth daily.  90 tablet  3  . testosterone (ANDROGEL) 50 MG/5GM GEL Apply 10 g topically daily.      Marland Kitchen zoster vaccine live, PF, (ZOSTAVAX)  19400 UNT/0.65ML injection Inject 19,400 Units into the skin once.  1 each  0   No facility-administered encounter medications on file as of 06/15/2012.    Allergies  Allergen Reactions  . Cefuroxime Axetil     REACTION: unsure of reaction  . Montelukast Sodium     REACTION: unsure of reaction    Current Medications, Allergies, Past Medical History, Past Surgical History, Family History, and Social History were reviewed in [employer] record.   Review of Systems        See HPI - all other systems neg except as  noted...  The patient complains of dyspnea on exertion.  The patient denies anorexia, fever, weight loss, weight gain, vision loss, decreased hearing, hoarseness, chest pain, syncope, peripheral edema, prolonged cough, headaches, hemoptysis, abdominal pain, melena, hematochezia, severe indigestion/heartburn, hematuria, incontinence, muscle weakness, suspicious skin lesions, transient blindness, difficulty walking, depression, unusual weight change, abnormal bleeding, enlarged lymph nodes, and angioedema.     Objective:   Physical Exam      WD, Obese, 67 y/o WM in NAD... GENERAL:  Alert & oriented; pleasant & cooperative... HEENT:  [place]/AT, EOM-wnl, PERRLA, EACs-clear, TMs-wnl, NOSE-clear, THROAT-clear & wnl. NECK:  Supple w/ fairROM; no JVD; normal carotid impulses w/o bruits; no thyromegaly or nodules palpated; no lymphadenopathy. CHEST:  Clear to P & A; without wheezes/ rales/ or rhonchi; he has an intermittent dry irritative cough... HEART:  Regular Rhythm; without murmurs/ rubs/ or gallops. ABDOMEN:  Obese, soft & nontender; normal bowel sounds; no organomegaly or masses detected. EXT: without deformities, mild arthritic changes; no varicose veins/ venous insuffic/ or edema. NEURO:  CN's intact; motor testing normal; no focal neuro deficits DERM:  No lesions noted; no rash etc...  RADIOLOGY DATA:  Reviewed in the EPIC EMR & discussed w/ the  patient...  LABORATORY DATA:  Reviewed in the EPIC EMR & discussed w/ the patient...   Assessment & Plan:    OSA>  He uses CPAP nightly, continue same...  Chronic Cough>  He is quite dependent on the Phen expect w/ codeine, limited to one pint per month...   HBP>  Controlled on CCB, Diureti per DrPaz...  DYSLIPIDEMIA>  On Crestor, Tricor, + diet per DrPaz...  Obesity>  We reviewed diet & exercise but he has been noncompliant w/ both...  GI> GERD, IBS, Hems>  Stable on NexiumBid, etc...  GU> BPH, ED, Hypogonadism>  Followed by DrPaz & Urology...  DJD>  Followed by DrDean w/ prev left knee arthroscopy...  Vit D Defic>  On OTC Vit D supplementation & ok...  NEUROPATHY>  Followed by DrGibson in [place] & intol to Neurontin/ Lyrica, etc; on VICODIN 2.5-500 Tid per Neurology...  ANXIETY>  ?he still sees Psychiatrist, but we write Rx for Paxil & Klonopin (?)   Patient's Medications  New Prescriptions   AMOXICILLIN-CLAVULANATE (AUGMENTIN) 875-125 MG PER TABLET    Take 1 tablet by mouth 2 (two) times daily.  Previous Medications   AMLODIPINE (NORVASC) 10 MG TABLET    Take 1 tablet (10 mg total) by mouth daily.   ASCORBIC ACID (VITAMIN C) 500 MG TABLET    Take 1,000 mg by mouth daily.    ASPIRIN 81 MG TABLET    Take 81 mg by mouth daily.     CALCIUM CARBONATE-VITAMIN D (RA CALCIUM PLUS VITAMIN D) 600-400 MG-UNIT PER TABLET    Take 1 tablet by mouth daily.     CHOLECALCIFEROL (VITAMIN D) 2000 UNITS TABLET    Take 2,000 Units by mouth daily.     CLONAZEPAM (KLONOPIN) 1 MG TABLET    Take 1 mg by mouth 2 (two) times daily as needed.     CLOTRIMAZOLE (LOTRIMIN) 1 % CREAM    Apply topically 2 (two) times daily.     COENZYME Q10 (CO Q 10 PO)    Take by mouth.   CYANOCOBALAMIN (VITAMIN B-12) 2500 MCG SUBL    Place 1 tablet under the tongue daily.     ESOMEPRAZOLE (NEXIUM) 40 MG CAPSULE    Take 1 capsule (40 mg total) by mouth 2 (two) times daily.  30 minutes before meals   FENOFIBRATE (TRICOR)  145 MG TABLET    Take 1 tablet (145 mg total) by mouth daily.   HOMEOPATHIC PRODUCTS (ZICAM COLD REMEDY) LIQD    Take by mouth as needed.     HYDROCHLOROTHIAZIDE (HYDRODIURIL) 25 MG TABLET    Take 1/2 tablet by mouth daily   HYDROCODONE-ACETAMINOPHEN (VICODIN) 2.5-500 MG PER TABLET    Take 1 tablet by mouth every 6 (six) hours as needed.     MOMETASONE (NASONEX) 50 MCG/ACT NASAL SPRAY    Place 2 sprays into the nose 2 (two) times daily.   MULTIPLE VITAMINS-MINERALS (CENTRUM SILVER PO)    Take 1 tablet by mouth daily.     PAROXETINE (PAXIL-CR) 25 MG 24 HR TABLET    Take 25 mg by mouth every morning.     PYRIDOXINE HCL (VITAMIN B-6 CR) 200 MG TBCR    Take 1 tablet by mouth daily.     ROSUVASTATIN (CRESTOR) 20 MG TABLET    Take 1 tablet (20 mg total) by mouth daily.   TESTOSTERONE (ANDROGEL) 50 MG/5GM GEL    Apply 10 g topically daily.   ZOSTER VACCINE LIVE, PF, (ZOSTAVAX) 16109 UNT/0.65ML INJECTION    Inject 19,400 Units into the skin once.  Modified Medications   Modified Medication Previous Medication   PROMETHAZINE-CODEINE (PHENERGAN WITH CODEINE) 6.25-10 MG/5ML SYRUP promethazine-codeine (PHENERGAN WITH CODEINE) 6.25-10 MG/5ML syrup      Take 5 mLs by mouth every 6 (six) hours as needed.    Take 5 mLs by mouth every 6 (six) hours as needed.  Discontinued Medications   No medications on file

## 2012-06-15 NOTE — Telephone Encounter (Signed)
Ok to refill or does this need to be filled by Dr. Kriste Basque?

## 2012-06-15 NOTE — Patient Instructions (Addendum)
Today we updated your med list in our EPIC system...    Continue your current medications the same...  We refilled your cough syrup & Amoxicillin per your request...  To aide in weight loss- try a MEDITERRANEAN DIET and look to foods w/ a low GLYCEMIC INDEX...  Call for any questions...  Let's plan a follow up visit in 6 months w/ f/u CXR at that time.Marland KitchenMarland Kitchen

## 2012-06-16 NOTE — Telephone Encounter (Signed)
Refill done.  

## 2012-06-16 NOTE — Telephone Encounter (Signed)
Ok RF x 1 year 

## 2012-07-19 ENCOUNTER — Other Ambulatory Visit: Payer: Self-pay | Admitting: Pulmonary Disease

## 2012-07-21 ENCOUNTER — Other Ambulatory Visit: Payer: Self-pay | Admitting: Pulmonary Disease

## 2012-07-21 MED ORDER — ESOMEPRAZOLE MAGNESIUM 40 MG PO CPDR: DELAYED_RELEASE_CAPSULE | ORAL | Status: DC

## 2012-08-17 ENCOUNTER — Other Ambulatory Visit: Payer: Self-pay | Admitting: Internal Medicine

## 2012-08-17 ENCOUNTER — Other Ambulatory Visit: Payer: Self-pay | Admitting: Pulmonary Disease

## 2012-08-17 NOTE — Telephone Encounter (Signed)
Refill done.  

## 2012-08-22 ENCOUNTER — Telehealth: Payer: Self-pay | Admitting: *Deleted

## 2012-08-22 NOTE — Telephone Encounter (Signed)
Called initiated PA awaiting fax form. 

## 2012-08-23 NOTE — Telephone Encounter (Signed)
PA faxed back awaiting response. 

## 2012-08-23 NOTE — Telephone Encounter (Signed)
PA approved through 08-23-13 under medicare Part d benefit. Pharmacy faxed approval letter scan to chart.

## 2012-09-07 ENCOUNTER — Telehealth: Payer: Self-pay | Admitting: Internal Medicine

## 2012-09-07 NOTE — Telephone Encounter (Signed)
Patient Information:  Caller Name: Markees  Phone: [phone]  Patient: Ronald King, Ronald King  Gender: Male  DOB: 1945-10-23  Age: 67 Years  PCP: Willow Ora  Office Follow Up:  Does the office need to follow up with this patient?: No  Instructions For The Office: N/A   Symptoms  Reason For Call & Symptoms: Caller was instructed to increase Androgel to 3 pumps a day.  Caller had an increase of blood pressure and the eveinings he can hear his heart beat/thumping in his right ear.  Pt felt like his head is in a fog and like his head is was in a vacuum when he was doing the 3 pumps.   Pt reduced the gel to 1 pump per day and feels better in regard to the fog.  Pt continues to have lack of energy and has the thumping in his right ear.  Caller asking about Thyroid issues.   Reviewed Health History In EMR: Yes  Reviewed Medications In EMR: Yes  Reviewed Allergies In EMR: Yes  Reviewed Surgeries / Procedures: Yes  Date of Onset of Symptoms: 08/19/2012  Guideline(s) Used:  High Blood Pressure  Weakness (Generalized) and Fatigue  Disposition Per Guideline:   See Within 3 Days in Office  Reason For Disposition Reached:   Fatigue (i.e., tires easily, decreased energy) and persists > 1 week  Advice Given:  Call Back If:  You become worse.  Patient Will Follow Care Advice:  YES  Appointment Scheduled:  09/08/2012 11:15:00 Appointment Scheduled Provider:  Willow Ora

## 2012-09-07 NOTE — Telephone Encounter (Signed)
Pt has an appt 6.13. 14 w/ Dr. Drue Novel.

## 2012-09-08 ENCOUNTER — Ambulatory Visit (INDEPENDENT_AMBULATORY_CARE_PROVIDER_SITE_OTHER): Payer: Medicare (Managed Care) | Admitting: Internal Medicine

## 2012-09-08 VITALS — BP 132/70 | HR 71 | Temp 98.5°F | Wt 278.0 lb

## 2012-09-08 DIAGNOSIS — G4733 Obstructive sleep apnea (adult) (pediatric): Secondary | ICD-10-CM

## 2012-09-08 DIAGNOSIS — I10 Essential (primary) hypertension: Secondary | ICD-10-CM

## 2012-09-08 DIAGNOSIS — E785 Hyperlipidemia, unspecified: Secondary | ICD-10-CM

## 2012-09-08 DIAGNOSIS — E291 Testicular hypofunction: Secondary | ICD-10-CM

## 2012-09-08 LAB — BASIC METABOLIC PANEL
BUN: 19 mg/dL (ref 6–23)
CO2: 32 mEq/L (ref 19–32)
Chloride: 108 mEq/L (ref 96–112)
Potassium: 4.3 mEq/L (ref 3.5–5.1)

## 2012-09-08 NOTE — Patient Instructions (Addendum)
Stop AndroGel. Continue with lisinopril, call Lillia Abed, let her know what strenght you take so we can refill your medicine. Come back by September 2014

## 2012-09-08 NOTE — Progress Notes (Signed)
  Subjective:    Patient ID: Ronald King, male    DOB: 07/18/1945, 67 y.o.   MRN: [id number]  HPI Here with several issues: Hypogonadism, was recommended to increase testosterone dose which he did by the end of April, 2 weeks later he felt his head was foggy and had a pulsatile feeling in the right ear. He check his blood pressure and it was 168/97, self-re-started  lisinopril (had a left over) with good results, BP is checked from time to time and is normal. BP today is very good. He also decrease testosterone down to one pump . Still has a pulsatile sensation on the right.  Also complained of feeling tired and   requiring naps frequently despite using a CPAP. Also, had aches, mild,  In different places on and off. Self decreased crestor from one tablet every other day.Aches are decreased, still has some persistent pain at the left elbow.   Past Medical History: Diabetes, DX 07-2011   Hypertension Hyperlipidemia Chronic bronchitis, chronic cough Sleep apnea hypogonadism anxiety GI --GERD, status post Nissen fundoplication --History of anemia   --Esophageal polyps --GI bleed hemorrhoids Neuropathy-- on vicodin Vit D def   DJD  Past Surgical History: S/P lap nissen fundoplication by Dr Daphine Deutscher 3/01 S/P left knee arthroscopy in 2007 by Dr August Saucer Tonsillectomy Nasal Surgery Anal fissure and cyst removal (pilonidal?) Left hand fracture/surgery  Social History: Married- 47 yrs 1 Daughter- ?hx Crohn's disease   Tobacco--quit 1967, only smoked 88yrs ETOH-- 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 No cough. No actual ear pain. The left ear always has tinnitus And decreased hearing, that is at baseline.      Objective:   Physical Exam General -- alert, well-developed, NAD.    HEENT -- TMs normal, throat w/o redness, face symmetric and not tender to  palpation Lungs -- normal respiratory effort, no intercostal retractions, no accessory muscle use, and normal breath sounds.   Heart-- normal rate, regular rhythm, no murmur, and no gallop.   Extremities-- no pretibial edema bilaterally  Neurologic-- alert & oriented X3 and strength normal in all extremities. Psych-- Cognition and judgment appear intact. Alert and cooperative with normal attention span and concentration.  not anxious appearing and not depressed appearing.      Assessment & Plan:   Pulsatile feeling in the right year, BP is okay, will call if symptoms persist.  Today , I spent more than 25 min with the patient, >50% of the time counseling, listening and addressing a number of issues

## 2012-09-09 ENCOUNTER — Encounter: Payer: Self-pay | Admitting: Internal Medicine

## 2012-09-09 NOTE — Assessment & Plan Note (Signed)
Sleep apnea, Good compliance with CPAP yet he feels tired. Recommend to change his mask also to see Dr.Clance if needed. It is possible that HRT is making his OSA worse, We'll see how he does w/o testosterone.

## 2012-09-09 NOTE — Assessment & Plan Note (Signed)
Hypogonadism, The patient explained today his  history of hypogonadism----> was started on HRT by urology a while back, shortly after he felt temporarily foggy, tired and  his BP was elevated and since then he is requiring BP meds.  Now he's experienced same problems since the T dose was increased. Apparently he is not tolerating HRT very well, I recommend to stop testosterone, will refer to endocrinology in few months.

## 2012-09-09 NOTE — Assessment & Plan Note (Signed)
High cholesterol, having aches and pains, see history of present illness, now taking crestor every other day. Plan is to recheck cholesterol when he comes back.

## 2012-09-09 NOTE — Assessment & Plan Note (Signed)
Hypertension, See history of present illness, BP was found to be elevated, self-re-started lisinopril (dose)  BP now better. Plan: Will call with exact dose of lisinopril. Check a BMP.

## 2012-10-07 ENCOUNTER — Other Ambulatory Visit: Payer: Self-pay | Admitting: Pulmonary Disease

## 2012-10-07 ENCOUNTER — Other Ambulatory Visit: Payer: Self-pay | Admitting: Internal Medicine

## 2012-10-09 NOTE — Telephone Encounter (Signed)
Refill done.  

## 2012-12-11 ENCOUNTER — Ambulatory Visit: Payer: Medicare Other | Admitting: Internal Medicine

## 2012-12-15 ENCOUNTER — Telehealth: Payer: Self-pay | Admitting: Internal Medicine

## 2012-12-15 DIAGNOSIS — Z0000 Encounter for general adult medical examination without abnormal findings: Secondary | ICD-10-CM

## 2012-12-15 MED ORDER — ZOSTER VACCINE LIVE 19400 UNT/0.65ML ~~LOC~~ SOLR: 0.6500 mL | Freq: Once | SUBCUTANEOUS | Status: DC

## 2012-12-15 NOTE — Telephone Encounter (Signed)
Patient's wife is calling to request that an order for a shingles vaccine be sent to [facility] in [place] on [address]. She would like it sent before 4pm so that he can have it today. Please call once sent.

## 2012-12-15 NOTE — Telephone Encounter (Signed)
Rx for zostavax sent to [facility] in [place] per pt request. Pt made aware

## 2012-12-20 ENCOUNTER — Encounter: Payer: Self-pay | Admitting: Internal Medicine

## 2012-12-28 ENCOUNTER — Ambulatory Visit: Payer: Medicare Other | Admitting: Pulmonary Disease

## 2013-01-03 ENCOUNTER — Encounter: Payer: Self-pay | Admitting: Internal Medicine

## 2013-01-04 ENCOUNTER — Encounter: Payer: Self-pay | Admitting: Internal Medicine

## 2013-01-04 ENCOUNTER — Ambulatory Visit (INDEPENDENT_AMBULATORY_CARE_PROVIDER_SITE_OTHER): Payer: Medicare Other | Admitting: Pulmonary Disease

## 2013-01-04 ENCOUNTER — Encounter: Payer: Self-pay | Admitting: Pulmonary Disease

## 2013-01-04 ENCOUNTER — Ambulatory Visit (INDEPENDENT_AMBULATORY_CARE_PROVIDER_SITE_OTHER): Payer: Medicare Other | Admitting: Internal Medicine

## 2013-01-04 VITALS — BP 124/70 | HR 75 | Temp 97.6°F | Ht 74.0 in | Wt 280.4 lb

## 2013-01-04 VITALS — BP 125/76 | HR 88 | Temp 98.2°F | Wt 278.6 lb

## 2013-01-04 DIAGNOSIS — K219 Gastro-esophageal reflux disease without esophagitis: Secondary | ICD-10-CM

## 2013-01-04 DIAGNOSIS — R0602 Shortness of breath: Secondary | ICD-10-CM

## 2013-01-04 DIAGNOSIS — M1990 Unspecified osteoarthritis, unspecified site: Secondary | ICD-10-CM

## 2013-01-04 DIAGNOSIS — E559 Vitamin D deficiency, unspecified: Secondary | ICD-10-CM

## 2013-01-04 DIAGNOSIS — E119 Type 2 diabetes mellitus without complications: Secondary | ICD-10-CM

## 2013-01-04 DIAGNOSIS — J329 Chronic sinusitis, unspecified: Secondary | ICD-10-CM

## 2013-01-04 DIAGNOSIS — R1319 Other dysphagia: Secondary | ICD-10-CM

## 2013-01-04 DIAGNOSIS — G4733 Obstructive sleep apnea (adult) (pediatric): Secondary | ICD-10-CM

## 2013-01-04 DIAGNOSIS — E785 Hyperlipidemia, unspecified: Secondary | ICD-10-CM

## 2013-01-04 DIAGNOSIS — R5381 Other malaise: Secondary | ICD-10-CM | POA: Insufficient documentation

## 2013-01-04 DIAGNOSIS — R739 Hyperglycemia, unspecified: Secondary | ICD-10-CM

## 2013-01-04 DIAGNOSIS — E291 Testicular hypofunction: Secondary | ICD-10-CM

## 2013-01-04 DIAGNOSIS — R05 Cough: Secondary | ICD-10-CM

## 2013-01-04 DIAGNOSIS — I10 Essential (primary) hypertension: Secondary | ICD-10-CM

## 2013-01-04 LAB — TSH: TSH: 1.06 u[IU]/mL (ref 0.35–5.50)

## 2013-01-04 LAB — LIPID PANEL
Cholesterol: 152 mg/dL (ref 0–200)
HDL: 35.1 mg/dL — ABNORMAL LOW (ref >39.00)
LDL Cholesterol: 92 mg/dL (ref 0–99)
Total CHOL/HDL Ratio: 4
Triglycerides: 123 mg/dL (ref 0.0–149.0)
VLDL: 24.6 mg/dL (ref 0.0–40.0)

## 2013-01-04 LAB — VITAMIN B12: Vitamin B-12: 612 pg/mL (ref 211–911)

## 2013-01-04 MED ORDER — PROMETHAZINE-CODEINE 6.25-10 MG/5ML PO SYRP: 5.0000 mL | ORAL_SOLUTION | Freq: Four times a day (QID) | ORAL | Status: DC | PRN

## 2013-01-04 MED ORDER — AMOXICILLIN 500 MG PO CAPS: 500.0000 mg | ORAL_CAPSULE | Freq: Two times a day (BID) | ORAL | Status: DC

## 2013-01-04 NOTE — Patient Instructions (Signed)
Get your blood work before you leave  Next visit in 6 months  for a physical exam   Take Nasonex  OTC 2 sprays in each side of the nose every night  Check the  blood pressure 2 or 3 times a week, be sure it is between 110/60 and 140/85. If it is consistently higher or lower, let me know\

## 2013-01-04 NOTE — Assessment & Plan Note (Signed)
Labs

## 2013-01-04 NOTE — Assessment & Plan Note (Signed)
Self discontinue  lisinopril, BP ok, no change

## 2013-01-04 NOTE — Patient Instructions (Signed)
Today we updated your med list in our EPIC system...    Continue your current medications the same...  We refilled your PHENERGAN EXPECTORANT w/ CODEINE and the AMOXICILLIN  Per request...  Remember to continue the Nexium twice daily & the antireflux regimen (elev head of bed 6" etc)...  Let's plan a follow up visit in 39mo, sooner if needed for problems.Marland KitchenMarland Kitchen

## 2013-01-04 NOTE — Progress Notes (Signed)
  Subjective:    Patient ID: Ronald King, male    DOB: 25-Jan-1946, 67 y.o.   MRN: [id number]  HPI Routine office visit Hypertension--BP improved so he self discontinue lisinopril GERD--on PPIs twice a day, asymptomatic Anxiety--good compliance of medications, reports good control of symptoms, no depression at all. Continue with lack of energy, does not feel "energized" when he wakes up but feels better as the day goes by. Also reports facial congestion in the morning.      Past Medical History  Diagnosis Date  . Dyslipidemia   . Hypertension   . Esophageal polyp   . Anemia   . History of gastrointestinal hemorrhage   . OSA (obstructive sleep apnea)     on CPAP  . GERD (gastroesophageal reflux disease)     s/p [employer]  . Chronic bronchitis   . Chronic cough   . Hemorrhoids, internal   . DJD (degenerative joint disease)   . Vitamin D deficiency   . Neuropathy     on vicodin  . Diabetes 07-2011  . Hypogonadism, male   . Anxiety    Past Surgical History  Procedure Laterality Date  . Laparoscopic nissen fundoplication  05/1999    Dr. Daphine Deutscher  . Knee arthroscopy  2007    Dr. August Saucer  . Tonsillectomy    . Nose surgery    . Anal fissure repair    . Hand fracture surgery      left   History   Social History  . Marital Status: Married    Spouse Name: N/A    Number of Children: N/A  . Years of Education: N/A   Occupational History  . Regulatory affairs officer, retired     retired  . minister     [employer]   Social History Main Topics  . Smoking status: Former Smoker -- 1.00 packs/day for 3 years    Quit date: 03/29/1965  . Smokeless tobacco: Never Used  . Alcohol Use: No  . Drug Use: No  . Sexual Activity: Not on file   Other Topics Concern  . Not on file   Social History Narrative  . No narrative on file   Review of Systems Denies chest pain, shortness or breath or orthopnea No nausea, vomiting, diarrhea or blood in the stools. Good  compliance with CPAP. Does not feel sleepy throughout the day, very seldom needs a nap. Admits to sneezing, not taking any allergy medications, reports antihistaminics dry his mouth.    Objective:   Physical Exam BP 125/76  Pulse 88  Temp(Src) 98.2 F (36.8 C)  Wt 278 lb 9.6 oz (126.372 kg)  BMI 35.75 kg/m2  SpO2 99% General -- alert, well-developed, NAD.  Neck --no JVD at 45 HEENT-- Nose slt  congested. Lungs -- normal respiratory effort, no intercostal retractions, no accessory muscle use, and normal breath sounds.  Heart-- normal rate, regular rhythm, no murmur.   Extremities--trace  pretibial edema bilaterally  Neurologic--  alert & oriented X3. Speech normal, gait normal, strength normal in all extremities.  Psych-- Cognition and judgment appear intact. Cooperative with normal attention span and concentration. No anxious appearing , no depressed appearing.      Assessment & Plan:

## 2013-01-04 NOTE — Assessment & Plan Note (Signed)
Vitamin D was low in 2010, better in 2012, recheck

## 2013-01-04 NOTE — Assessment & Plan Note (Signed)
Ongoing fatigue, no CHF on clinical grounds, anxiety well controlled, no depression, recent labs reviewed, nothing to explain his symptoms. Good compliance with CPAP. Hypogonadism may be playing a role, offered a referral to endocrinology. Plan: TSH, B12, folic acid, vitamin D. Reassess in 6 months.

## 2013-01-04 NOTE — Assessment & Plan Note (Addendum)
Continue with fatigue, offered a  endocrinology referral, he agreed, referral will be placed

## 2013-01-04 NOTE — Assessment & Plan Note (Signed)
To see ortho about L knee

## 2013-01-05 ENCOUNTER — Encounter: Payer: Self-pay | Admitting: Pulmonary Disease

## 2013-01-05 ENCOUNTER — Telehealth: Payer: Self-pay | Admitting: Internal Medicine

## 2013-01-05 LAB — VITAMIN D 25 HYDROXY (VIT D DEFICIENCY, FRACTURES): Vit D, 25-Hydroxy: 39 ng/mL (ref 30–89)

## 2013-01-05 NOTE — Progress Notes (Signed)
Subjective:    Patient ID: Ronald King, male    DOB: 03-09-1946, 67 y.o.   MRN: [id number]  HPI 67 y/o WM here for a follow up visit... he has a chronic cough x years and is dependent on codeine containing cough syrups... he was miserable when ENDAL HD went off the market and we had to substitute PHENERGAN EXPECTORANT w/ CODEINE - 1 tsp by mouth Q6H as needed for cough, limiting him to 1 pint per month... His new Primary Care doctor is DrPaz.  ~  June 16, 2011:  59mo ROV & Ronald King mentioned that he starts on Medicare in June & wanted my recs for a primary care doctor;  He has OSA- on CPAP nightly, wt unchanged at 274# & we reviewed diet recs;  Chr cough managed w/ Phen Expect w/ Codeine & he is lim to 1pt per month- he is here for refill Rx for the next 34mo;  BP controlled on his 3 meds- see below;  FLP remains poorly controlled on his diet + Tricor145> he now agrees to start Statin & rec Crestor20;  See prob list below>>    He saw his Neurologist- DrGibson in [place] 10/12 for f/u Neuropathy & for ?CTS, & Tinnitus (rec to f/u w/ his ENT)> DrGibson gives him Rx for Hydrocodone-Tylenol 2.5-500 Tid as needed #90 at a time w/ refills, and bilat wrist splints...    He reports that he still sees Psychiatrist DrSanders- on Klonopin 1mg  Bid and Paxil CR 25mg /d; we do not have any notes from them... LABS  3/13:  FLP- all parameters off on diet alone;  Chems- ok x BS=126 Creat=1.3;  CBC- wnl;  TSH=1.88;  PSA=1.73;  Testos=185 on Testim Q3d;  UA=clear.  ~  December 16, 2011:  62mo ROV & Ronald King has established primary care thru DrPaz & his notes are reviewed; He is c/o his chronic cough which is controlled w/ his chronic cough syrup (Phen expect w/ Cod- lim to 1pint per month) and vague intermittent variable CWP on Vicodin & Robaxin;  He denies palpit, SOB, edema, etc...     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    We reviewed prob list, meds, xrays and labs> see below for updates >> OK Flu shot today & refill his Augmentin & cough syrup... CXR 9/13 showed normal heart size, clear lungs, DJD spine... PFT 9/13 showed FVC=3.98 (76%), FEV1=3.46 (86%), %1sec=87, mid-flows=112% predicted...  ~  June 15, 2012:  9mo ROV & Ronald King is fatigue, tired, no energy despite Androgel Rx and he saw DrPaz several weeks ago- Medicare annual wellness visit & review of medical problems...    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    Chronic Cough> managed for yrs w/ cough syrup, currently Phen Expect w/ Codeine limited to 1 pt per month; he denies sput, hemoptysis, SOB, CP, etc...    Hx upper airway resistance syndrome> followed by DrClance & seen 7/13- responded very well to CPAP despite a fairly neg Sleep Study in 1999; he continues on CPAP nightly & resting well, excellent daytime alertness, etc... We reviewed prob list, meds, xrays and labs> see below for updates >>   ~  January 04, 2013:  39mo  ROV & Ronald King is here to refill his cough syrup; he notes fatigue & problem w/ Low-T and DrPaz had discussed Endocrine eval; pt tells me he's had mod difficulty w/ left knee & is set up to see Ortho next week... 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    Chronic Cough> managed for yrs w/ cough syrup, currently Phen Expect w/ Codeine limited to 1 pt per month; he denies sput, hemoptysis, SOB, CP, etc...    Hx upper airway resistance syndrome> followed by DrClance & last seen 7/13- responded very well to CPAP despite a fairly neg Sleep Study in 1999; he continues on CPAP nightly  & resting well, excellent daytime alertness, etc... We reviewed prob list, meds, xrays and labs> see below for updates >> he had the 2014 FLU vaccine 9/14...           Problem List:       Hx of SINUSITIS (ICD-473.9) - prev on NASONEX 2spBid + he requests Rx for AMOXICILLIN to keep on hand for travel... prev eval DrByers- "he wanted to do surg"... "my sinuses are crazy" but he refuses to consider the surg... also had dysphonia eval at [facility] in the [facility]'s w/ Botox tried.  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 saw DrClance 12/09- he felt upper airway resistance syndrome... doing better w/ new CPAP machine in 2010. ~  7/13:  He had f/u DrClance & doing well, no changes made, continue same CPAP...  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 he had an extensive eval in the past w/ GI, ENT, Pulm, etc... he uses PHEN EXPECT w/ CODEINE 1 tsp Q6H up to 1 pint/mo... ~  Cough was no diff off ACE/ ARB in past & he does not want to change meds... ~  CXR 1/10 showed clear,  just min biapical pleural thickening... ~  2/11: states intol to Symbicort w/ HAs... try ADVAIR 250Bid... ~  CXR 2/11 showed clear lungs, NAD.Ronald King. ~  CXR 9/12 showed chr bronchitic changes, NAD... ~  PFT 9/13 showed FVC=3.98 (76%), FEV1=3.46 (86%), %1sec=87, mid-flows=112% predicted...  ~  CXR 9/13 showed normal heart size, clear lungs, DJD spine...  HYPERTENSION (ICD-401.9) - meds adjusted by DrCrenshaw on NORVASC 10mg /d & HCTZ 25mg - 1/2 tab daily...  ~  8/12:  BP= 134/82 & denies HA, fatigue, visual changes, CP, palipit, dizziness, syncope, dyspnea, edema, etc... ~  3/13:  BP= 124/76 & he denies CP, palpit, SOB, edema... ~  9/13:  BP= 110/66 & he remains asymptomatic... ~  He has established Primary Care f/u w/ DrPaz => on Amlod10, Lisin10, Hct12.5 w/ BP= 124/70 today.  DYSLIPIDEMIA (ICD-272.4) - on TRICOR 145mg /d but refuses Statin meds or Lipid Clinic referral... ~  FLP 1/10 showed TChol 161, TG 239, HDL 30, LDL  72... he wants to control w/ diet. ~  FLP 9/10 showed TChol 192, TG 426, HDL 30, LDL 80... rec> start [id number]. ~  FLP 2/11 showed TChol 173, TG 102, HDL 37, LDL 115... continue Tricor Rx + diet. ~  FLP 2/12 showed TChol 221, TG 249, HDL 31, LDL 140... Reminded to take med regularly & get wt down. ~  FLP 8/12 on Tric145 showed TChol 194, TG 169, HDL 32, LDL 128... Still not at goal, refuses statin med. ~  FLP 3/13 on Tric145 showed TChol 229, TG 177, HDL 35, LDL 140... rec to start CRESTOR20 + better low fat diet! ~  FLP 5/13 on Tric145+Cres20 showed TChol 151, TG 188, HDL 33, LDL 80... Continue same. ~  Lipids followed by DrPaz => labs reviewed in EPIC...  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 discussed diet + exercise and the need for weight reduction both from his overall health status and his OSA... "I moderate what I eat". ~  weight 1/10 = 267# ~  weight 2/11 = 272#... discussed again! ~  Weight 2/12 = 276#... He is unconcerned! ~  Weight 8/12 = 274#... Reviewed diet & exercise needed. ~  Weight 3/13 = 274#... He is NOT trying. ~  Weight 9/13 = 272# ~  Weight 3/14 = 274# ~  Weight 10/14 = 280#  GERD (ICD-530.81) - on NEXIUM 40mg Bid & off the prev hs Reglan... s/p lap nissen 3/01 by DrMartin.... ~  EGD 11/05 showed prev Nissen, +gastritis... ~  EGD 6/10 by DrDBrodie showed functioning Nissen, post-op changes, sessile polyp in distal esoph= benign mucosa.  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 ~  f/u colonoscopy 6/10 by DrDBrodie showed negative x for int hems...  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  Finally started ANDROGEL 5gm packet Qd in 2012 ~  labs 1/10 showed PSA= 1.63 ~  labs 2/11 showed PSA= 1.65,  ~  Labs 1/12 showed Testosterone level = 160-205 ~  Labs  2/12 showed PSA = 1.63, Testosterone level = 553 ~  Labs 8/12 showed Testosterone level = 428... rec continue daily Androgel. ~  Labs 3/13 showed PSA= 1.73, Testosterone level = 185 & states he is using the Androgel Q3rd day, rec to incr to daily. ~  Follow up labs & med adjustments per DrPaz => he referred pt to Endocrine...  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  VITAMIN D DEFICIENCY (ICD-268.9) - on Vit D OTC 2000 u daily... ~  labs 1/10 showed Vit D level = 16... Vit D 2000 u daily started. ~  labs 2/11 showed Vit D level = 34... continue supplement. ~  Labs 8/12 showed Vit D level = 47... continue same.  HEADACHE (ICD-784.0) - Chr daily migraines in the past w/ prev HA eval by DrFreeman (Botox shots helped in the past)...  NEUROPATHY (ICD-355.9) - c/o burning in legs and eval from Neuro in [facility] & Rx w/ Neurontin + Hydrocodone for pain... this may be part of a familial trait as several relatives have severe periph neuropathy (?hereditary syndrome)- we don't have notes from [facility] neurology. ~  Pt reports that he was intol to Neurontin, Lyrica, & one other ("memory gaps") & he refused Cymbalta trial... ~  3/13:  Reviewed w/ pt> he continues on VICODIN 2.5-500 from DrGibson up to Tid, 90/mo w/ refills from him...  ANXIETY (ICD-300.00) - on PAXIL CR 25mg /d & KLONOPIN 1mg  Bid as needed... they were prev perscribed by ?psychiatrist? but he notes "I've been on these for >11yrs"... he still sees ?DrSteven?DrSanders?, but we fill his perscriptions.   Past Surgical History  Procedure Laterality Date  . Laparoscopic nissen fundoplication  05/1999    Dr. Daphine Deutscher  . Knee  arthroscopy  2007    Dr. August Saucer  . Tonsillectomy    . Nose surgery    . Anal fissure repair    . Hand fracture surgery      left    Outpatient Encounter Prescriptions as of 01/04/2013  Medication Sig Dispense Refill  . amLODipine (NORVASC) 10  MG tablet take 1 tablet by mouth once daily  90 tablet  3  . ascorbic acid (VITAMIN C) 500 MG tablet Take 1,000 mg by mouth daily.       Ronald King aspirin 81 MG tablet Take 81 mg by mouth daily.        . Calcium Carbonate-Vitamin D (RA CALCIUM PLUS VITAMIN D) 600-400 MG-UNIT per tablet Take 1 tablet by mouth daily.        . Cholecalciferol (VITAMIN D) 2000 UNITS tablet Take 2,000 Units by mouth daily.        . clonazePAM (KLONOPIN) 1 MG tablet Take 1 mg by mouth 2 (two) times daily as needed.        . clotrimazole (LOTRIMIN) 1 % cream Apply topically 2 (two) times daily.        . Coenzyme Q10 (CO Q 10 PO) Take by mouth.      . Cyanocobalamin (VITAMIN B-12) 2500 MCG SUBL Place 1 tablet under the tongue daily.        Ronald King esomeprazole (NEXIUM) 40 MG capsule Take 1 capsule (40 mg total) by mouth 2 (two) times daily. 30 minutes before meals  180 capsule  3  . Homeopathic Products (ZICAM COLD REMEDY) LIQD Take by mouth as needed.        . hydrochlorothiazide (HYDRODIURIL) 25 MG tablet take 1/2 tablet by mouth once daily  45 tablet  3  . HYDROcodone-acetaminophen (VICODIN) 2.5-500 MG per tablet Take 1 tablet by mouth 2 (two) times daily as needed.       . Multiple Vitamins-Minerals (CENTRUM SILVER PO) Take 1 tablet by mouth daily.        Ronald King PARoxetine (PAXIL-CR) 25 MG 24 hr tablet Take 25 mg by mouth every morning.        . promethazine-codeine (PHENERGAN WITH CODEINE) 6.25-10 MG/5ML syrup Take 5 mLs by mouth every 6 (six) hours as needed.  180 mL  5  . Pyridoxine HCl (VITAMIN B-6 CR) 200 MG TBCR Take 1 tablet by mouth daily.        . rosuvastatin (CRESTOR) 20 MG tablet take 1 tablet by mouth every other day      . [DISCONTINUED] promethazine-codeine (PHENERGAN WITH CODEINE) 6.25-10 MG/5ML syrup Take 5 mLs by mouth every 6 (six) hours as needed.  180 mL  5  . amoxicillin (AMOXIL) 500 MG capsule Take 1 capsule (500 mg total) by mouth 2 (two) times daily.  14 capsule  1  . [DISCONTINUED] fenofibrate (TRICOR) 145 MG  tablet Take 1 tablet (145 mg total) by mouth daily.  90 tablet  3   No facility-administered encounter medications on file as of 01/04/2013.    Allergies  Allergen Reactions  . Cefuroxime Axetil     REACTION: unsure of reaction  . Montelukast Sodium     REACTION: unsure of reaction    Current Medications, Allergies, Past Medical History, Past Surgical History, Family History, and Social History were reviewed in [employer] record.   Review of Systems        See HPI - all other systems neg except as noted...  The patient complains of dyspnea on  exertion.  The patient denies anorexia, fever, weight loss, weight gain, vision loss, decreased hearing, hoarseness, chest pain, syncope, peripheral edema, prolonged cough, headaches, hemoptysis, abdominal pain, melena, hematochezia, severe indigestion/heartburn, hematuria, incontinence, muscle weakness, suspicious skin lesions, transient blindness, difficulty walking, depression, unusual weight change, abnormal bleeding, enlarged lymph nodes, and angioedema.     Objective:   Physical Exam      WD, Obese, 67 y/o WM in NAD... GENERAL:  Alert & oriented; pleasant & cooperative... HEENT:  [place]/AT, EOM-wnl, PERRLA, EACs-clear, TMs-wnl, NOSE-clear, THROAT-clear & wnl. NECK:  Supple w/ fairROM; no JVD; normal carotid impulses w/o bruits; no thyromegaly or nodules palpated; no lymphadenopathy. CHEST:  Clear to P & A; without wheezes/ rales/ or rhonchi; he has an intermittent dry irritative cough... HEART:  Regular Rhythm; without murmurs/ rubs/ or gallops. ABDOMEN:  Obese, soft & nontender; normal bowel sounds; no organomegaly or masses detected. EXT: without deformities, mild arthritic changes; no varicose veins/ venous insuffic/ or edema. NEURO:  CN's intact; motor testing normal; no focal neuro deficits DERM:  No lesions noted; no rash etc...  RADIOLOGY DATA:  Reviewed in the EPIC EMR & discussed w/ the  patient...  LABORATORY DATA:  Reviewed in the EPIC EMR & discussed w/ the patient...   Assessment & Plan:    OSA>  He uses CPAP nightly, continue same...  Chronic Cough>  He is quite dependent on the Phen expect w/ codeine, limited to one pint per month; doesn't want to change meds but would be appropriate to switch off the ACE...   HBP>  Controlled on CCB, Diuretic w/ ACE added by per DrPaz...  DYSLIPIDEMIA>  On Crestor, Tricor, + diet per DrPaz...  Obesity>  We reviewed diet & exercise but he has been noncompliant w/ both...  GI> GERD, IBS, Hems>  Stable on NexiumBid, etc...  GU> BPH, ED, Hypogonadism>  Followed by DrPaz & Urology...  DJD>  Followed by DrDean w/ prev left knee arthroscopy...  Vit D Defic>  On OTC Vit D supplementation & ok...  NEUROPATHY>  Followed by DrGibson in [place] & intol to Neurontin/ Lyrica, etc; on VICODIN 2.5-500 Tid per Neurology...  ANXIETY>  ?he still sees Psychiatrist, but we write Rx for Paxil & Klonopin (?)   Patient's Medications  New Prescriptions   AMOXICILLIN (AMOXIL) 500 MG CAPSULE    Take 1 capsule (500 mg total) by mouth 2 (two) times daily.  Previous Medications   AMLODIPINE (NORVASC) 10 MG TABLET    take 1 tablet by mouth once daily   ASCORBIC ACID (VITAMIN C) 500 MG TABLET    Take 1,000 mg by mouth daily.    ASPIRIN 81 MG TABLET    Take 81 mg by mouth daily.     CALCIUM CARBONATE-VITAMIN D (RA CALCIUM PLUS VITAMIN D) 600-400 MG-UNIT PER TABLET    Take 1 tablet by mouth daily.     CHOLECALCIFEROL (VITAMIN D) 2000 UNITS TABLET    Take 2,000 Units by mouth daily.     CLONAZEPAM (KLONOPIN) 1 MG TABLET    Take 1 mg by mouth 2 (two) times daily as needed.     CLOTRIMAZOLE (LOTRIMIN) 1 % CREAM    Apply topically 2 (two) times daily.     COENZYME Q10 (CO Q 10 PO)    Take by mouth.   CYANOCOBALAMIN (VITAMIN B-12) 2500 MCG SUBL    Place 1 tablet under the tongue daily.     ESOMEPRAZOLE (NEXIUM) 40 MG CAPSULE    Take 1  capsule (40 mg total)  by mouth 2 (two) times daily. 30 minutes before meals   HOMEOPATHIC PRODUCTS (ZICAM COLD REMEDY) LIQD    Take by mouth as needed.     HYDROCHLOROTHIAZIDE (HYDRODIURIL) 25 MG TABLET    take 1/2 tablet by mouth once daily   HYDROCODONE-ACETAMINOPHEN (VICODIN) 2.5-500 MG PER TABLET    Take 1 tablet by mouth 2 (two) times daily as needed.    MULTIPLE VITAMINS-MINERALS (CENTRUM SILVER PO)    Take 1 tablet by mouth daily.     PAROXETINE (PAXIL-CR) 25 MG 24 HR TABLET    Take 25 mg by mouth every morning.     PYRIDOXINE HCL (VITAMIN B-6 CR) 200 MG TBCR    Take 1 tablet by mouth daily.     ROSUVASTATIN (CRESTOR) 20 MG TABLET    take 1 tablet by mouth every other day  Modified Medications   Modified Medication Previous Medication   PROMETHAZINE-CODEINE (PHENERGAN WITH CODEINE) 6.25-10 MG/5ML SYRUP promethazine-codeine (PHENERGAN WITH CODEINE) 6.25-10 MG/5ML syrup      Take 5 mLs by mouth every 6 (six) hours as needed.    Take 5 mLs by mouth every 6 (six) hours as needed.  Discontinued Medications   FENOFIBRATE (TRICOR) 145 MG TABLET    Take 1 tablet (145 mg total) by mouth daily.

## 2013-01-05 NOTE — Telephone Encounter (Signed)
Patient called about his upcoming referral and wanted to see could he switch doctors. thanks

## 2013-01-05 NOTE — Telephone Encounter (Signed)
Returned pts call. Notes and referral faxed to [facility] per pt request. They will contact pt.

## 2013-01-15 ENCOUNTER — Ambulatory Visit: Payer: Medicare Other | Admitting: Endocrinology

## 2013-07-09 ENCOUNTER — Ambulatory Visit (INDEPENDENT_AMBULATORY_CARE_PROVIDER_SITE_OTHER)
Admission: RE | Admit: 2013-07-09 | Discharge: 2013-07-09 | Disposition: A | Discharge status: Home / Self Care | Payer: Medicare Other | Source: Ambulatory Visit | Attending: Pulmonary Disease | Admitting: Pulmonary Disease

## 2013-07-09 ENCOUNTER — Other Ambulatory Visit: Payer: Self-pay | Admitting: *Deleted

## 2013-07-09 ENCOUNTER — Telehealth: Payer: Self-pay | Admitting: *Deleted

## 2013-07-09 ENCOUNTER — Ambulatory Visit (INDEPENDENT_AMBULATORY_CARE_PROVIDER_SITE_OTHER): Payer: Medicare Other | Admitting: Pulmonary Disease

## 2013-07-09 ENCOUNTER — Encounter: Payer: Self-pay | Admitting: Pulmonary Disease

## 2013-07-09 VITALS — BP 122/64 | HR 80 | Temp 98.5°F | Ht 74.0 in | Wt 279.6 lb

## 2013-07-09 DIAGNOSIS — J329 Chronic sinusitis, unspecified: Secondary | ICD-10-CM

## 2013-07-09 DIAGNOSIS — K219 Gastro-esophageal reflux disease without esophagitis: Secondary | ICD-10-CM

## 2013-07-09 DIAGNOSIS — R05 Cough: Secondary | ICD-10-CM

## 2013-07-09 DIAGNOSIS — R059 Cough, unspecified: Secondary | ICD-10-CM

## 2013-07-09 DIAGNOSIS — R1319 Other dysphagia: Secondary | ICD-10-CM

## 2013-07-09 DIAGNOSIS — G4733 Obstructive sleep apnea (adult) (pediatric): Secondary | ICD-10-CM

## 2013-07-09 DIAGNOSIS — R0602 Shortness of breath: Secondary | ICD-10-CM

## 2013-07-09 MED ORDER — ESOMEPRAZOLE MAGNESIUM 40 MG PO CPDR: 40.0000 mg | DELAYED_RELEASE_CAPSULE | Freq: Two times a day (BID) | ORAL | Status: DC

## 2013-07-09 MED ORDER — HYDROCODONE-HOMATROPINE 5-1.5 MG/5ML PO SYRP: 5.0000 mL | ORAL_SOLUTION | Freq: Four times a day (QID) | ORAL | Status: DC | PRN

## 2013-07-09 MED ORDER — FENOFIBRATE 145 MG PO TABS: ORAL_TABLET | ORAL | Status: DC

## 2013-07-09 MED ORDER — AMOXICILLIN 500 MG PO CAPS: 500.0000 mg | ORAL_CAPSULE | Freq: Two times a day (BID) | ORAL | Status: DC

## 2013-07-09 NOTE — Telephone Encounter (Signed)
It was prescribed to him before, ok call  #30 and 1 RF Needs OV before next RF

## 2013-07-09 NOTE — Progress Notes (Signed)
Subjective:    Patient ID: Ronald King, male    DOB: 03-09-1946, 68 y.o.   MRN: [id number]  HPI 68 y/o WM here for a follow up visit... he has a chronic cough x years and is dependent on codeine containing cough syrups... he was miserable when ENDAL HD went off the market and we had to substitute PHENERGAN EXPECTORANT w/ CODEINE - 1 tsp by mouth Q6H as needed for cough, limiting him to 1 pint per month... His new Primary Care doctor is DrPaz.  ~  June 16, 2011:  59mo ROV & Ronald King mentioned that he starts on Medicare in June & wanted my recs for a primary care doctor;  He has OSA- on CPAP nightly, wt unchanged at 274# & we reviewed diet recs;  Chr cough managed w/ Phen Expect w/ Codeine & he is lim to 1pt per month- he is here for refill Rx for the next 34mo;  BP controlled on his 3 meds- see below;  FLP remains poorly controlled on his diet + Tricor145> he now agrees to start Statin & rec Crestor20;  See prob list below>>    He saw his Neurologist- DrGibson in [place] 10/12 for f/u Neuropathy & for ?CTS, & Tinnitus (rec to f/u w/ his ENT)> DrGibson gives him Rx for Hydrocodone-Tylenol 2.5-500 Tid as needed #90 at a time w/ refills, and bilat wrist splints...    He reports that he still sees Psychiatrist DrSanders- on Klonopin 1mg  Bid and Paxil CR 25mg /d; we do not have any notes from them... LABS  3/13:  FLP- all parameters off on diet alone;  Chems- ok x BS=126 Creat=1.3;  CBC- wnl;  TSH=1.88;  PSA=1.73;  Testos=185 on Testim Q3d;  UA=clear.  ~  December 16, 2011:  62mo ROV & Ronald King has established primary care thru DrPaz & his notes are reviewed; He is c/o his chronic cough which is controlled w/ his chronic cough syrup (Phen expect w/ Cod- lim to 1pint per month) and vague intermittent variable CWP on Vicodin & Robaxin;  He denies palpit, SOB, edema, etc...     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    We reviewed prob list, meds, xrays and labs> see below for updates >> OK Flu shot today & refill his Augmentin & cough syrup... CXR 9/13 showed normal heart size, clear lungs, DJD spine... PFT 9/13 showed FVC=3.98 (76%), FEV1=3.46 (86%), %1sec=87, mid-flows=112% predicted...  ~  June 15, 2012:  9mo ROV & Ronald King CC is fatigue, tired, no energy despite Androgel Rx and he saw DrPaz several weeks ago- Medicare annual wellness visit & review of medical problems...    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    Chronic Cough> managed for yrs w/ cough syrup, currently Phen Expect w/ Codeine limited to 1 pt per month; he denies sput, hemoptysis, SOB, CP, etc...    Hx upper airway resistance syndrome> followed by DrClance & seen 7/13- responded very well to CPAP despite a fairly neg Sleep Study in 1999; he continues on CPAP nightly & resting well, excellent daytime alertness, etc... We reviewed prob list, meds, xrays and labs> see below for updates >>   ~  January 04, 2013:  39mo  ROV & Ronald King is here to refill his cough syrup; he notes fatigue & problem w/ Low-T and DrPaz had discussed Endocrine eval; pt tells me he's had mod difficulty w/ left knee & is set up to see Ortho next week... 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    Chronic Cough> managed for yrs w/ cough syrup, currently Phen Expect w/ Codeine limited to 1 pt per month; he denies sput, hemoptysis, SOB, CP, etc...    Hx upper airway resistance syndrome> followed by DrClance & last seen 7/13- responded very well to CPAP despite a fairly neg Sleep Study in 1999; he continues on CPAP nightly  & resting well, excellent daytime alertness, etc... We reviewed prob list, meds, xrays and labs> see below for updates >> he had the 2014 FLU vaccine and a Shingles vaccine 9/14...  ~  July 09, 2013:  72mo ROV and Ronald King has established primary care w/ DrPaz> here today for Pulm follow up & refill of his cough syrup...    AR/ recurrent sinusitis> on Zicam OTC & off Nasonex; he wants refill of prn Amox500Tid to use on his mission trips "when I need it for sinus"...    Chronic Cough> managed for yrs w/ cough syrup, prev Phen Expect w/ Codeine limited to 1 pt per month, but insurance Co wants change to Hycodan- ok; cough remains dry- he denies sput, hemoptysis, SOB, CP, etc...    Hx upper airway resistance syndrome> followed by DrClance & last seen 7/13- responded very well to CPAP despite a fairly neg Sleep Study in 1999; he continues on CPAP nightly & resting well, excellent daytime alertness, etc...    GERD> on Nexium40Bid + vigorous antireflux regimen (elev HOB 6", npo after dinner, etc); we reviewed LPR problems; he had Nissen fundoplication in 8341, last EGD was 2010 by DrDBrodie- no acute changes... We reviewed prob list, meds, xrays and labs>> he reports feeling better on Testos shots from [place], but he blames this for his inability to lose weight...  CXR 4/15 showed norm heart size, clear lungs, NAD...          Problem List:       Hx of SINUSITIS (ICD-473.9) - prev on NASONEX 2spBid + he requests Rx for AMOXICILLIN to keep on hand for travel... prev eval DrByers- "he wanted to do surg"... "my sinuses are crazy" but he refuses to consider the surg... also had dysphonia eval at [facility] in the 90's w/ Botox tried.  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 saw DrClance 12/09- he felt upper airway resistance syndrome... doing better w/ new CPAP machine in 2010. ~  7/13:  He had f/u DrClance & doing well, no changes made, continue same CPAP...  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 he had an extensive eval in the past w/ GI, ENT, Pulm, etc... he uses PHEN EXPECT w/ CODEINE 1 tsp Q6H up to 1 pint/mo... ~  Cough was no diff off ACE/ ARB in past & he does not want to change meds... ~  CXR 1/10 showed clear,  just min biapical pleural thickening... ~  2/11: states intol to Symbicort w/ HAs... try ADVAIR 250Bid... ~  CXR 2/11 showed clear lungs, NAD.Marland Kitchen. ~  CXR 9/12 showed chr bronchitic changes, NAD... ~  PFT  9/13 showed FVC=3.98 (76%), FEV1=3.46 (86%), %1sec=87, mid-flows=112% predicted...  ~  CXR 9/13 showed normal heart size, clear lungs, DJD spine... ~  4/15: his insurance company is requesting change from Phenergan expect w/ cod- we will write for Hycodan one tsp Q6h prn & continue to limit to 1pt/mo... ~  CXR 4/15 showed norm heart size, clear lungs, NAD...   HYPERTENSION (ICD-401.9) - meds adjusted by DrCrenshaw on NORVASC 10mg /d & HCTZ 25mg - 1/2 tab daily...  ~  8/12:  BP= 134/82 & denies HA, fatigue, visual changes, CP, palipit, dizziness, syncope, dyspnea, edema, etc... ~  3/13:  BP= 124/76 & he denies CP, palpit, SOB, edema... ~  9/13:  BP= 110/66 & he remains asymptomatic... ~  He has established Primary Care f/u w/ DrPaz => on Amlod10, Hct12.5, & off prev Lisin; BP= 122/64 today.  DYSLIPIDEMIA (ICD-272.4) - on TRICOR 145mg /d but refuses Statin meds or Lipid Clinic referral... ~  FLP 1/10 showed TChol 161, TG 239, HDL 30, LDL 72... he wants to control w/ diet. ~  FLP 9/10 showed TChol 192, TG 426, HDL 30, LDL 80... rec> start [id number]. ~  [place] 2/11 showed TChol 173, TG 102, HDL 37, LDL 115... continue Tricor Rx + diet. ~  FLP 2/12 showed TChol 221, TG 249, HDL 31, LDL 140... Reminded to take med regularly & get wt down. ~  FLP 8/12 on Tric145 showed TChol 194, TG 169, HDL 32, LDL 128... Still not at goal, refuses statin med. ~  [place] 3/13 on Tric145 showed TChol 229, TG 177, HDL 35, LDL 140... rec to start CRESTOR20 + better low fat diet! ~  FLP 5/13 on  Tric145+Cres20 showed TChol 151, TG 188, HDL 33, LDL 80... Continue same. ~  Lipids followed by DrPaz, now on Cres20 monotherapy => labs reviewed in EPIC...  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 discussed diet + exercise and the need for weight reduction both from his overall health status and his OSA... "I moderate what I eat". ~  weight 1/10 = 267# ~  weight 2/11 = 272#... discussed again! ~  Weight 2/12 = 276#... He is unconcerned! ~  Weight 8/12 = 274#... Reviewed diet & exercise needed. ~  Weight 3/13 = 274#... He is NOT trying. ~  Weight 9/13 = 272# ~  Weight 3/14 = 274# ~  Weight 10/14 = 280#  GERD (ICD-530.81) - on NEXIUM 40mg Bid & off the prev hs Reglan... s/p lap nissen 3/01 by DrMartin.... ~  EGD 11/05 showed prev Nissen, +gastritis... ~  EGD 6/10 by DrDBrodie showed functioning Nissen, post-op changes, sessile polyp in distal esoph= benign mucosa.  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 ~  f/u colonoscopy 6/10 by DrDBrodie showed negative x for int hems...  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  Finally started ANDROGEL 5gm packet Qd in 2012 ~  labs 1/10 showed PSA= 1.63 ~  labs 2/11 showed PSA= 1.65,  ~  Labs 1/12 showed Testosterone level = 160-205 ~  Labs 2/12 showed PSA = 1.63, Testosterone level = 553 ~  Labs 8/12 showed Testosterone level = 428... rec continue daily Androgel. ~  Labs 3/13 showed PSA= 1.73, Testosterone level = 185 & states he is using the Androgel Q3rd day, rec to incr to daily. ~  Follow up labs & med adjustments per DrPaz => he referred pt to Endocrine...  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  VITAMIN D DEFICIENCY (ICD-268.9) - on Vit D OTC 2000 u  daily... ~  labs 1/10 showed Vit D level = 16... Vit D 2000 u daily started. ~  labs 2/11 showed Vit D level = 34... continue supplement. ~  Labs 8/12 showed Vit D level = 47... continue same.  HEADACHE (ICD-784.0) - Chr daily migraines in the past w/ prev HA eval by DrFreeman (Botox shots helped in the past)...  NEUROPATHY (ICD-355.9) - c/o burning in legs and eval from Neuro in [facility] & Rx w/ Neurontin + Hydrocodone for pain... this may be part of a familial trait as several relatives have severe periph neuropathy (?hereditary syndrome)- we don't have notes from [facility] neurology. ~  Pt reports that he was intol to Neurontin, Lyrica, & one other ("memory gaps") & he refused Cymbalta trial... ~  3/13:  Reviewed w/ pt> he continues on VICODIN 2.5-500 from DrGibson up to Tid, 90/mo w/ refills from him...  ANXIETY (ICD-300.00) - on PAXIL CR 25mg /d & KLONOPIN 1mg  Bid as needed... they were prev perscribed by ?psychiatrist? but he notes "I've been on these for >69yrs"... he still sees ?DrSteven?DrSanders?, but we fill his perscriptions.   Past Surgical History  Procedure Laterality Date  . Laparoscopic nissen fundoplication  04/7251    Dr. Hassell Done  . Knee arthroscopy  2007    Dr. Marlou Sa  . Tonsillectomy    . Nose surgery    . Anal fissure repair    . Hand fracture surgery      left    Outpatient Encounter Prescriptions as of 07/09/2013  Medication Sig  . amLODipine (NORVASC) 10 MG tablet take 1 tablet by mouth once daily  . amoxicillin (AMOXIL) 500 MG capsule Take 1 capsule (500 mg total) by mouth 2 (two) times daily.  Marland Kitchen ascorbic acid (VITAMIN C) 500 MG tablet Take 1,000 mg by mouth daily.   Marland Kitchen aspirin 81 MG tablet Take 81 mg by mouth daily.    . Calcium Carbonate-Vitamin D (RA CALCIUM PLUS VITAMIN D) 600-400 MG-UNIT per tablet Take 1 tablet by mouth daily.    . Cholecalciferol (VITAMIN D) 2000 UNITS tablet Take 2,000 Units by mouth daily.    . clonazePAM (KLONOPIN) 1 MG tablet Take  1 mg by mouth 2 (two) times daily as needed.    . clotrimazole (LOTRIMIN) 1 % cream Apply topically 2 (two) times daily.    . Coenzyme Q10 (CO Q 10 PO) Take by mouth.  . Cyanocobalamin (VITAMIN B-12) 2500 MCG SUBL Place 1 tablet under the tongue daily.    Marland Kitchen esomeprazole (NEXIUM) 40 MG capsule Take 1 capsule (40 mg total) by mouth 2 (two) times daily. 30 minutes before meals  . Homeopathic Products (ZICAM COLD REMEDY) LIQD Take by mouth as needed.    . hydrochlorothiazide (HYDRODIURIL) 25 MG tablet take 1/2 tablet by mouth once daily  . HYDROcodone-acetaminophen (VICODIN) 2.5-500 MG per tablet Take 1 tablet by mouth 2 (two) times daily as needed.   . Multiple Vitamins-Minerals (CENTRUM SILVER PO) Take 1 tablet by mouth daily.    Marland Kitchen PARoxetine (PAXIL-CR) 25 MG 24 hr tablet Take 25 mg by mouth every morning.    . promethazine-codeine (PHENERGAN WITH CODEINE) 6.25-10 MG/5ML syrup Take 5 mLs by mouth every 6 (six) hours as needed.  Marland Kitchen  Pyridoxine HCl (VITAMIN B-6 CR) 200 MG TBCR Take 1 tablet by mouth daily.    . rosuvastatin (CRESTOR) 20 MG tablet take 1 tablet by mouth every other day    Allergies  Allergen Reactions  . Cefuroxime Axetil     REACTION: unsure of reaction  . Montelukast Sodium     REACTION: unsure of reaction    Current Medications, Allergies, Past Medical History, Past Surgical History, Family History, and Social History were reviewed in [employer] record.   Review of Systems        See HPI - all other systems neg except as noted...  The patient complains of dyspnea on exertion.  The patient denies anorexia, fever, weight loss, weight gain, vision loss, decreased hearing, hoarseness, chest pain, syncope, peripheral edema, prolonged cough, headaches, hemoptysis, abdominal pain, melena, hematochezia, severe indigestion/heartburn, hematuria, incontinence, muscle weakness, suspicious skin lesions, transient blindness, difficulty walking, depression, unusual  weight change, abnormal bleeding, enlarged lymph nodes, and angioedema.     Objective:   Physical Exam      WD, Obese, 68 y/o WM in NAD... GENERAL:  Alert & oriented; pleasant & cooperative... HEENT:  [place]/AT, EOM-wnl, PERRLA, EACs-clear, TMs-wnl, NOSE-clear, THROAT-clear & wnl. NECK:  Supple w/ fairROM; no JVD; normal carotid impulses w/o bruits; no thyromegaly or nodules palpated; no lymphadenopathy. CHEST:  Clear to P & A; without wheezes/ rales/ or rhonchi; he has an intermittent dry irritative cough... HEART:  Regular Rhythm; without murmurs/ rubs/ or gallops. ABDOMEN:  Obese, soft & nontender; normal bowel sounds; no organomegaly or masses detected. EXT: without deformities, mild arthritic changes; no varicose veins/ venous insuffic/ or edema. NEURO:  CN's intact; motor testing normal; no focal neuro deficits DERM:  No lesions noted; no rash etc...  RADIOLOGY DATA:  Reviewed in the EPIC EMR & discussed w/ the patient...  LABORATORY DATA:  Reviewed in the EPIC EMR & discussed w/ the patient...   Assessment & Plan:    OSA>  He uses CPAP nightly, continue same...  Chronic Cough>  He is quite dependent on the cough syrup, limited to one pint per month; doesn't want to change meds but insurance won't fill the Phenergan expectorant; change to Hycodan- 1 tsp Q6h prn...   HBP>  Controlled on CCB & Diuretic now, off prev ACE and cough is improved...  DYSLIPIDEMIA>  On Crestor, + diet per DrPaz; off prev Tricor rx......  Obesity>  We reviewed diet & exercise but he has been noncompliant w/ both...  GI> GERD, IBS, Hems>  Stable on NexiumBid, etc...  GU> BPH, ED, Hypogonadism>  Followed by DrPaz & Urology...  DJD>  Followed by DrDean w/ prev left knee arthroscopy...  Vit D Defic>  On OTC Vit D supplementation & ok...  NEUROPATHY>  Followed by DrGibson in [place] & intol to Neurontin/ Lyrica, etc; on VICODIN 2.5-500 Tid per Neurology...  ANXIETY>  ?he still sees Psychiatrist, but we  write Rx for Paxil & Klonopin (?)   Patient's Medications  New Prescriptions   FENOFIBRATE (TRICOR) 145 MG TABLET    Take one tablet daily. DUE office visit before next refill > [phone].   HYDROCODONE-HOMATROPINE (HYCODAN) 5-1.5 MG/5ML SYRUP    Take 5 mLs by mouth every 6 (six) hours as needed for cough.  Previous Medications   AMLODIPINE (NORVASC) 10 MG TABLET    take 1 tablet by mouth once daily   ASCORBIC ACID (VITAMIN C) 500 MG TABLET    Take 1,000 mg by mouth daily.  ASPIRIN 81 MG TABLET    Take 81 mg by mouth daily.     CALCIUM CARBONATE-VITAMIN D (RA CALCIUM PLUS VITAMIN D) 600-400 MG-UNIT PER TABLET    Take 1 tablet by mouth daily.     CHOLECALCIFEROL (VITAMIN D) 2000 UNITS TABLET    Take 2,000 Units by mouth daily.     CLONAZEPAM (KLONOPIN) 1 MG TABLET    Take 1 mg by mouth 2 (two) times daily as needed.     CLOTRIMAZOLE (LOTRIMIN) 1 % CREAM    Apply topically 2 (two) times daily.     COENZYME Q10 (CO Q 10 PO)    Take by mouth.   CYANOCOBALAMIN (VITAMIN B-12) 2500 MCG SUBL    Place 1 tablet under the tongue daily.     HOMEOPATHIC PRODUCTS (ZICAM COLD REMEDY) LIQD    Take by mouth as needed.     HYDROCHLOROTHIAZIDE (HYDRODIURIL) 25 MG TABLET    take 1/2 tablet by mouth once daily   HYDROCODONE-ACETAMINOPHEN (VICODIN) 2.5-500 MG PER TABLET    Take 1 tablet by mouth 2 (two) times daily as needed.    MULTIPLE VITAMINS-MINERALS (CENTRUM SILVER PO)    Take 1 tablet by mouth daily.     PAROXETINE (PAXIL-CR) 25 MG 24 HR TABLET    Take 25 mg by mouth every morning.     PROMETHAZINE-CODEINE (PHENERGAN WITH CODEINE) 6.25-10 MG/5ML SYRUP    Take 5 mLs by mouth every 6 (six) hours as needed.   PYRIDOXINE HCL (VITAMIN B-6 CR) 200 MG TBCR    Take 1 tablet by mouth daily.     ROSUVASTATIN (CRESTOR) 20 MG TABLET    take 1 tablet by mouth every other day  Modified Medications   Modified Medication Previous Medication   AMOXICILLIN (AMOXIL) 500 MG CAPSULE amoxicillin (AMOXIL) 500 MG capsule       Take 1 capsule (500 mg total) by mouth 2 (two) times daily.    Take 1 capsule (500 mg total) by mouth 2 (two) times daily.   ESOMEPRAZOLE (NEXIUM) 40 MG CAPSULE esomeprazole (NEXIUM) 40 MG capsule      Take 1 capsule (40 mg total) by mouth 2 (two) times daily. 30 minutes before meals    Take 1 capsule (40 mg total) by mouth 2 (two) times daily. 30 minutes before meals  Discontinued Medications   No medications on file

## 2013-07-09 NOTE — Telephone Encounter (Signed)
rx sent

## 2013-07-09 NOTE — Patient Instructions (Signed)
Today we updated your med list in our EPIC system...  We decided to change the Phenergan expectorant w/ codeine to HYCOTUSS- one tsp every 6h as needed for cough...  We refilled your Amoxicillin per request to use as needed for sinus infections...  Today we rechecked your CXR>    We will contact you w/ the results when available...   Call for any questions...  Let's plan a follow up visit in 61mo, sooner if needed for breathing problems.Marland KitchenMarland Kitchen

## 2013-07-09 NOTE — Telephone Encounter (Signed)
rx refill-fenofibrate 145mg    last OV- 01/04/13  Did not see medication on med list . Okay to refill ?

## 2013-07-12 ENCOUNTER — Telehealth: Payer: Self-pay | Admitting: Pulmonary Disease

## 2013-07-12 NOTE — Telephone Encounter (Signed)
lmomtcb x 1 to discuss his nexium.  i called the pharmacy and they stated that the pt had this filled last with them on 04-27-2013.  They stated that the insurance is saying that this cannot be filled until 09-15-2013 but this med does not need a PA done.

## 2013-07-12 NOTE — Telephone Encounter (Signed)
Please notify patient> CXR is clear and essentially wnl...  -----------------------------  Pt is aware of results.  Is asking about the status of his Nexium.  Explained what sounded like a PA that needed to be done for this medication since he takes it BID instead of QD.  Asked Marliss Czar about this, she asked me to forward this message to her so she could look into it.

## 2013-07-13 NOTE — Telephone Encounter (Signed)
Pt called back and he is aware that the new rx for the nexium has been sent to the pharmacy and they will hold this until he needs this filled.  He is aware of cxr results as well. Nothing further is needed.

## 2013-07-13 NOTE — Telephone Encounter (Signed)
lmtcb x2 

## 2013-08-01 MED ORDER — FENOFIBRATE 145 MG PO TABS
145.0000 mg | ORAL_TABLET | Freq: Every day | ORAL | Status: DC
Start: ? — End: 2014-02-28

## 2013-09-05 ENCOUNTER — Telehealth: Payer: Self-pay | Admitting: *Deleted

## 2013-09-05 NOTE — Telephone Encounter (Signed)
Medication List and allergies:  Reviewed and updated  90 Day supply/mail order: N/A Local prescriptions: [facility] [address]  Immunizations Due: Prevnar  A/P FH, PSH, or Personal Hx: Reviewed and updated Flu vaccine: 11/2012 Tdap:03/2005 PNA: 11/2011 Shingles: 11/2012  CCS: 08/2008 normal repeat in 10 years PSA: 2.68 on 05/2012   To Discuss with Provider: Wants to have his ears checked for excessive wax build up

## 2013-09-06 ENCOUNTER — Encounter: Payer: Self-pay | Admitting: Internal Medicine

## 2013-09-06 ENCOUNTER — Ambulatory Visit (INDEPENDENT_AMBULATORY_CARE_PROVIDER_SITE_OTHER): Payer: Medicare Other | Admitting: Internal Medicine

## 2013-09-06 VITALS — BP 124/75 | HR 80 | Temp 98.2°F | Ht 73.1 in | Wt 280.0 lb

## 2013-09-06 DIAGNOSIS — E785 Hyperlipidemia, unspecified: Secondary | ICD-10-CM

## 2013-09-06 DIAGNOSIS — Z0000 Encounter for general adult medical examination without abnormal findings: Secondary | ICD-10-CM

## 2013-09-06 DIAGNOSIS — R1319 Other dysphagia: Secondary | ICD-10-CM

## 2013-09-06 DIAGNOSIS — R5381 Other malaise: Secondary | ICD-10-CM

## 2013-09-06 DIAGNOSIS — G589 Mononeuropathy, unspecified: Secondary | ICD-10-CM

## 2013-09-06 DIAGNOSIS — I10 Essential (primary) hypertension: Secondary | ICD-10-CM

## 2013-09-06 DIAGNOSIS — E291 Testicular hypofunction: Secondary | ICD-10-CM

## 2013-09-06 DIAGNOSIS — K219 Gastro-esophageal reflux disease without esophagitis: Secondary | ICD-10-CM

## 2013-09-06 DIAGNOSIS — R5383 Other fatigue: Secondary | ICD-10-CM

## 2013-09-06 DIAGNOSIS — R7303 Prediabetes: Secondary | ICD-10-CM

## 2013-09-06 DIAGNOSIS — Z23 Encounter for immunization: Secondary | ICD-10-CM

## 2013-09-06 DIAGNOSIS — R7309 Other abnormal glucose: Secondary | ICD-10-CM

## 2013-09-06 LAB — LIPID PANEL
CHOLESTEROL: 148 mg/dL (ref 0–200)
HDL: 29.8 mg/dL — ABNORMAL LOW (ref >39.00)
LDL CALC: 96 mg/dL (ref 0–99)
NONHDL: 118.2
Total CHOL/HDL Ratio: 5
Triglycerides: 109 mg/dL (ref 0.0–149.0)
VLDL: 21.8 mg/dL (ref 0.0–40.0)

## 2013-09-06 LAB — TSH: TSH: 1.84 u[IU]/mL (ref 0.35–4.50)

## 2013-09-06 LAB — AST: AST: 28 U/L (ref 0–37)

## 2013-09-06 LAB — CBC WITH DIFFERENTIAL/PLATELET
BASOS ABS: 0 10*3/uL (ref 0.0–0.1)
Basophils Relative: 0.4 % (ref 0.0–3.0)
Eosinophils Absolute: 0.3 10*3/uL (ref 0.0–0.7)
Eosinophils Relative: 3.9 % (ref 0.0–5.0)
HEMATOCRIT: 45.5 % (ref 39.0–52.0)
HEMOGLOBIN: 15.2 g/dL (ref 13.0–17.0)
Lymphocytes Relative: 19.4 % (ref 12.0–46.0)
Lymphs Abs: 1.4 10*3/uL (ref 0.7–4.0)
MCHC: 33.3 g/dL (ref 30.0–36.0)
MCV: 89.6 fl (ref 78.0–100.0)
MONO ABS: 0.6 10*3/uL (ref 0.1–1.0)
MONOS PCT: 9.1 % (ref 3.0–12.0)
NEUTROS ABS: 4.8 10*3/uL (ref 1.4–7.7)
Neutrophils Relative %: 67.2 % (ref 43.0–77.0)
PLATELETS: 239 10*3/uL (ref 150.0–400.0)
RBC: 5.09 Mil/uL (ref 4.22–5.81)
RDW: 14.3 % (ref 11.5–15.5)
WBC: 7.1 10*3/uL (ref 4.0–10.5)

## 2013-09-06 LAB — BASIC METABOLIC PANEL
BUN: 13 mg/dL (ref 6–23)
CHLORIDE: 106 meq/L (ref 96–112)
CO2: 29 meq/L (ref 19–32)
CREATININE: 1.1 mg/dL (ref 0.4–1.5)
Calcium: 9.3 mg/dL (ref 8.4–10.5)
GFR: 70.23 mL/min (ref >60.00)
GLUCOSE: 121 mg/dL — AB (ref 70–99)
POTASSIUM: 4 meq/L (ref 3.5–5.1)
Sodium: 142 mEq/L (ref 135–145)

## 2013-09-06 LAB — ALT: ALT: 23 U/L (ref 0–53)

## 2013-09-06 NOTE — Patient Instructions (Addendum)
Get your blood work before you leave   please return the stool test   Next visit is for routine check up in 6 months  No need to come back fasting Please make an appointment     Fall Prevention and [place] cause injuries and can affect all age groups. It is possible to use preventive measures to significantly decrease the likelihood of falls. There are many simple measures which can make your home safer and prevent falls. OUTDOORS  Repair cracks and edges of walkways and driveways.  Remove high doorway thresholds.  Trim shrubbery on the main path into your home.  Have good outside lighting.  Clear walkways of tools, rocks, debris, and clutter.  Check that handrails are not broken and are securely fastened. Both sides of steps should have handrails.  Have leaves, snow, and ice cleared regularly.  Use sand or salt on walkways during winter months.  In the garage, clean up grease or oil spills. BATHROOM  Install night lights.  Install grab bars by the toilet and in the tub and shower.  Use non-skid mats or decals in the tub or shower.  Place a plastic non-slip stool in the shower to sit on, if needed.  Keep floors dry and clean up all water on the floor immediately.  Remove soap buildup in the tub or shower on a regular basis.  Secure bath mats with non-slip, double-sided rug tape.  Remove throw rugs and tripping hazards from the floors. BEDROOMS  Install night lights.  Make sure a bedside light is easy to reach.  Do not use oversized bedding.  Keep a telephone by your bedside.  Have a firm chair with side arms to use for getting dressed.  Remove throw rugs and tripping hazards from the floor. KITCHEN  Keep handles on pots and pans turned toward the center of the stove. Use back burners when possible.  Clean up spills quickly and allow time for drying.  Avoid walking on wet floors.  Avoid hot utensils and knives.  Position shelves so they  are not too high or low.  Place commonly used objects within easy reach.  If necessary, use a sturdy step stool with a grab bar when reaching.  Keep electrical cables out of the way.  Do not use floor polish or wax that makes floors slippery. If you must use wax, use non-skid floor wax.  Remove throw rugs and tripping hazards from the floor. STAIRWAYS  Never leave objects on stairs.  Place handrails on both sides of stairways and use them. Fix any loose handrails. Make sure handrails on both sides of the stairways are as long as the stairs.  Check carpeting to make sure it is firmly attached along stairs. Make repairs to worn or loose carpet promptly.  Avoid placing throw rugs at the top or bottom of stairways, or properly secure the rug with carpet tape to prevent slippage. Get rid of throw rugs, if possible.  Have an electrician put in a light switch at the top and bottom of the stairs. OTHER FALL PREVENTION TIPS  Wear low-heel or rubber-soled shoes that are supportive and fit well. Wear closed toe shoes.  When using a stepladder, make sure it is fully opened and both spreaders are firmly locked. Do not climb a closed stepladder.  Add color or contrast paint or tape to grab bars and handrails in your home. Place contrasting color strips on first and last steps.  Learn and use mobility aids  as needed. Install an electrical emergency response system.  Turn on lights to avoid dark areas. Replace light bulbs that burn out immediately. Get light switches that glow.  Arrange furniture to create clear pathways. Keep furniture in the same place.  Firmly attach carpet with non-skid or double-sided tape.  Eliminate uneven floor surfaces.  Select a carpet pattern that does not visually hide the edge of steps.  Be aware of all pets. OTHER HOME SAFETY TIPS  Set the water temperature for 120 F (48.8 C).  Keep emergency numbers on or near the telephone.  Keep smoke detectors on  every level of the home and near sleeping areas. Document Released: 03/05/2002 Document Revised: 09/14/2011 Document Reviewed: 06/04/2011 [facility] Patient Information 2014 [place].

## 2013-09-06 NOTE — Assessment & Plan Note (Signed)
Currently on HRT, feeling better

## 2013-09-06 NOTE — Assessment & Plan Note (Signed)
Well controlled, check LFTs 

## 2013-09-06 NOTE — Assessment & Plan Note (Signed)
Chart and pertinent labs reviewed  Last EGD 08/30/2008, biopsy was negative.  Currently asymptomatic

## 2013-09-06 NOTE — Progress Notes (Signed)
Subjective:    Patient ID: Ronald King, male    DOB: 20-Nov-1945, 68 y.o.   MRN: [id number]  DOS:  09/06/2013 Type of  Visit:    Here for Medicare AWV: 1. Risk factors based on Past M, S, F history: reviewed 2. Physical Activities:  Gym x 3/week, some walking 3. Depression/mood:  symptoms well controlled with clonazepam and Paxil 4. Hearing: still  tinnitus, some hearing loss, saw ENT ~ 2013. Symptoms are stable 5. ADL's:  independent 6. Fall Risk: low risk, see instructions 7. home Safety: does feel safe at home   8. Height, weight, &visual acuity: see VS, uses glasses, sees eye doctor regulalrly 9. Counseling: provided 10. Labs ordered based on risk factors: if needed   11. Referral Coordination: if needed 12.  Care Plan, see assessment and plan   13.   Cognitive Assessment: Motor skills and cognition seems intact.  In addition, today we discussed the following: pre diabetes, under the care of endocrinology, last A1c 6.4 per pt  Testosterone, followup by endocrinology, on HRT, feeling better, more energy. Neuropathy, symptoms relatively well control, follow up by neurology. Request a ear lavage , no ear pain or discharge  ROS No  CP, SOB No palpitations Denies  nausea, vomiting diarrhea, blood in the stools No dysphagia or odynophagia (+) cough chronic , at baseline  (-) wheezing, chest congestion No dysuria, gross hematuria, difficulty urinating   No headache, Denies dizziness      Past Medical History  Diagnosis Date  . Dyslipidemia   . Hypertension   . Esophageal polyp   . Anemia   . History of gastrointestinal hemorrhage   . OSA (obstructive sleep apnea)     on CPAP  . GERD (gastroesophageal reflux disease)     s/p [employer] F.  . Chronic bronchitis   . Chronic cough   . Hemorrhoids, internal   . DJD (degenerative joint disease)   . Vitamin D deficiency   . Neuropathy     on vicodin  . Diabetes 07-2011  . Hypogonadism, male   . Anxiety     Past  Surgical History  Procedure Laterality Date  . Laparoscopic nissen fundoplication  11/5619    Dr. Hassell Done  . Knee arthroscopy  2007    Dr. Marlou Sa  . Tonsillectomy    . Nose surgery    . Anal fissure repair    . Hand fracture surgery      left    History   Social History  . Marital Status: Married    Spouse Name: N/A    Number of Children: 1  . Years of Education: N/A   Occupational History  . Technical brewer, retired     retired  . 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 Smoking status: Former Smoker -- 1.00 packs/day for 3 years    Quit date: 03/29/1965  . Smokeless tobacco: Never Used  . Alcohol Use: No  . Drug Use: No  . Sexual Activity: Not on file   Other Topics Concern  . Not on file   Social History Narrative  . No narrative on file     Family History  Problem Relation Age of Onset  . Cerebral aneurysm Father   . Alcohol abuse Father   . Neuropathy Mother   . Migraines Brother     several  . Diabetes Maternal Uncle   . Heart disease Brother  CABG at age 21  . Colon cancer Neg Hx   . Prostate cancer Neg Hx        Medication List       This list is accurate as of: 09/06/13  2:14 PM.  Always use your most recent med list.               amLODipine 10 MG tablet  Commonly known as:  NORVASC  take 1 tablet by mouth once daily     amoxicillin 500 MG capsule  Commonly known as:  AMOXIL  Take 500 mg by mouth 2 (two) times daily. Prn bronchitis     ascorbic acid 500 MG tablet  Commonly known as:  VITAMIN C  Take 1,000 mg by mouth daily.     aspirin 81 MG tablet  Take 81 mg by mouth daily.     CENTRUM SILVER PO  Take 1 tablet by mouth daily.     clonazePAM 1 MG tablet  Commonly known as:  KLONOPIN  Take 1 mg by mouth 2 (two) times daily as needed.     clotrimazole 1 % cream  Commonly known as:  LOTRIMIN  Apply topically 2 (two) times daily.     CO Q 10 PO  Take by mouth.     CRESTOR 20 MG  tablet  Generic drug:  rosuvastatin  take 1 tablet by mouth every other day     esomeprazole 40 MG capsule  Commonly known as:  NEXIUM  Take 1 capsule (40 mg total) by mouth 2 (two) times daily. 30 minutes before meals     fenofibrate 145 MG tablet  Commonly known as:  TRICOR  Take 1 tablet (145 mg total) by mouth daily.     hydrochlorothiazide 25 MG tablet  Commonly known as:  HYDRODIURIL  take 1/2 tablet by mouth once daily     HYDROcodone-acetaminophen 2.5-500 MG per tablet  Commonly known as:  VICODIN  Take 1 tablet by mouth 2 (two) times daily as needed.     HYDROcodone-homatropine 5-1.5 MG/5ML syrup  Commonly known as:  HYCODAN  Take 5 mLs by mouth every 6 (six) hours as needed for cough.     PARoxetine 25 MG 24 hr tablet  Commonly known as:  PAXIL-CR  Take 25 mg by mouth every morning.     Pyridoxine HCl 200 MG Tbcr  Take 1 tablet by mouth daily.     RA CALCIUM PLUS VITAMIN D 600-400 MG-UNIT per tablet  Generic drug:  Calcium Carbonate-Vitamin D  Take 1 tablet by mouth daily.     testosterone cypionate 200 MG/ML injection  Commonly known as:  DEPOTESTOTERONE CYPIONATE  Inject into the muscle every 14 (fourteen) days.     Vitamin B-12 2500 MCG Subl  Place 1 tablet under the tongue daily.     Vitamin D 2000 UNITS tablet  Take 2,000 Units by mouth daily.     ZICAM COLD REMEDY Liqd  Take by mouth as needed.           Objective:   Physical Exam BP 124/75  Pulse 80  Temp(Src) 98.2 F (36.8 C)  Ht 6' 1.1" (1.857 m)  Wt 280 lb (127.007 kg)  BMI 36.83 kg/m2  SpO2 93% General -- alert, well-developed, NAD.  Neck --no thyromegaly  HEENT-- Not pale. + cerumen impaction R side  Lungs -- normal respiratory effort, no intercostal retractions, no accessory muscle use, and normal breath sounds.  Heart-- normal rate, regular rhythm, no  murmur.  Abdomen-- Not distended, good bowel sounds,soft, non-tender. Rectal-- No external abnormalities noted. Normal  sphincter tone. No rectal masses or tenderness. Stool brown, Hemoccult negative  Prostate-- hard to reach prostate d/t pt's habitus but seems normal Extremities-- no pretibial edema bilaterally  Neurologic--  alert & oriented X3. Speech normal, gait appropriate for age, strength symmetric and appropriate for age.  Psych-- Cognition and judgment appear intact. Cooperative with normal attention span and concentration. No anxious or depressed appearing.      Assessment & Plan:   Cerumen-- s/p lavage , TM normal

## 2013-09-06 NOTE — Assessment & Plan Note (Signed)
asx

## 2013-09-06 NOTE — Assessment & Plan Note (Signed)
Follow up by neurology, on Vicodin.reports symptoms are relatively well controlled

## 2013-09-06 NOTE — Assessment & Plan Note (Addendum)
Td 2007 Pneumonia shot 2013 prevnar today zostavax-- 11-2012  Cscope 08-2008, neg, next in 10 years, was rec hemocult q 2-3 years, IFOB provided last year, no results. Provided another iFOB today Check a PSA  rec to stay active and eat healthy

## 2013-09-06 NOTE — Assessment & Plan Note (Signed)
Pt reports endocrinology cheek a A1c recently, it was 6.4

## 2013-09-06 NOTE — Assessment & Plan Note (Signed)
Labs were okay, feeling better w/  HRT

## 2013-09-06 NOTE — Progress Notes (Signed)
Pre visit review using our clinic review tool, if applicable. No additional management support is needed unless otherwise documented below in the visit note. 

## 2013-09-06 NOTE — Assessment & Plan Note (Signed)
Chart and pertinent labs reviewed  Symptoms well-controlled, check a BMP, continue with present care

## 2013-09-07 ENCOUNTER — Telehealth: Payer: Self-pay | Admitting: Internal Medicine

## 2013-09-07 NOTE — Telephone Encounter (Signed)
Relevant patient education assigned to patient using Emmi. ° °

## 2013-09-17 ENCOUNTER — Other Ambulatory Visit: Payer: Self-pay | Admitting: Pulmonary Disease

## 2013-09-18 ENCOUNTER — Other Ambulatory Visit: Payer: Self-pay | Admitting: Pulmonary Disease

## 2013-09-22 ENCOUNTER — Other Ambulatory Visit: Payer: Self-pay | Admitting: Internal Medicine

## 2013-10-02 ENCOUNTER — Other Ambulatory Visit: Payer: Self-pay | Admitting: Internal Medicine

## 2013-10-09 ENCOUNTER — Other Ambulatory Visit (INDEPENDENT_AMBULATORY_CARE_PROVIDER_SITE_OTHER): Payer: Medicare Other

## 2013-10-09 DIAGNOSIS — Z0000 Encounter for general adult medical examination without abnormal findings: Secondary | ICD-10-CM

## 2013-10-09 LAB — FECAL OCCULT BLOOD, IMMUNOCHEMICAL: Fecal Occult Bld: NEGATIVE

## 2013-11-06 ENCOUNTER — Telehealth: Payer: Self-pay | Admitting: Pulmonary Disease

## 2013-11-06 MED ORDER — HYDROCODONE-HOMATROPINE 5-1.5 MG/5ML PO SYRP: 5.0000 mL | ORAL_SOLUTION | Freq: Four times a day (QID) | ORAL | Status: DC | PRN

## 2013-11-06 NOTE — Telephone Encounter (Signed)
rx has been printed out and placed on SN cart to be signed.  Will call pt once this is ready to be picked up.  

## 2013-11-07 MED ORDER — AZITHROMYCIN 250 MG PO TABS: ORAL_TABLET | ORAL | Status: DC

## 2013-11-07 NOTE — Telephone Encounter (Signed)
Per SN--  [place] for zpak.  This has been printed out and placed up front for the pt to pick up.

## 2013-11-07 NOTE — Telephone Encounter (Signed)
rx has been signed and placed up front for the pt to come by and pick up.  i have called the pt and he is aware.      Pt stated that he now has bronchitis and is requesting something for this as well.  Pt is aware that if anything is prescribed he would like this left up front with his other rx.  SN please advise. thanks

## 2014-01-10 ENCOUNTER — Encounter: Payer: Self-pay | Admitting: Pulmonary Disease

## 2014-01-10 ENCOUNTER — Ambulatory Visit (INDEPENDENT_AMBULATORY_CARE_PROVIDER_SITE_OTHER): Payer: Medicare Other | Admitting: Pulmonary Disease

## 2014-01-10 VITALS — BP 124/70 | HR 73 | Temp 98.2°F | Ht 74.0 in | Wt 277.0 lb

## 2014-01-10 DIAGNOSIS — R059 Cough, unspecified: Secondary | ICD-10-CM

## 2014-01-10 DIAGNOSIS — G4733 Obstructive sleep apnea (adult) (pediatric): Secondary | ICD-10-CM

## 2014-01-10 DIAGNOSIS — K219 Gastro-esophageal reflux disease without esophagitis: Secondary | ICD-10-CM

## 2014-01-10 DIAGNOSIS — J329 Chronic sinusitis, unspecified: Secondary | ICD-10-CM

## 2014-01-10 DIAGNOSIS — Z23 Encounter for immunization: Secondary | ICD-10-CM

## 2014-01-10 DIAGNOSIS — R05 Cough: Secondary | ICD-10-CM

## 2014-01-10 MED ORDER — AMOXICILLIN 500 MG PO CAPS: 500.0000 mg | ORAL_CAPSULE | Freq: Two times a day (BID) | ORAL | Status: DC

## 2014-01-10 MED ORDER — HYDROCODONE-HOMATROPINE 5-1.5 MG/5ML PO SYRP: 5.0000 mL | ORAL_SOLUTION | Freq: Three times a day (TID) | ORAL | Status: DC | PRN

## 2014-01-10 NOTE — Patient Instructions (Signed)
Today we updated your med list in our EPIC system...    Continue your current medications the same...  We refilled your Hycodan cough syrup and the Amoxicillin per your request...  Continue the vigorous antireflux regimen w/ the Nexium, elev head of bed, NPO after dinner, etc...  We gave you the 2015 flu vaccine today...  Call for any questions...  Let's plan a follow up visit in 15mo, sooner if needed for respiratory problems.Marland KitchenMarland Kitchen

## 2014-01-10 NOTE — Progress Notes (Signed)
Subjective:    Patient ID: Ronald King, male    DOB: 02/23/1946, 67 y.o.   MRN: [id number]  HPI 68 y/o WM here for a follow up visit... he has a chronic cough x years and is dependent on codeine containing cough syrups... he was miserable when ENDAL HD went off the market and we had to substitute PHENERGAN EXPECTORANT w/ CODEINE - 1 tsp by mouth Q6H as needed for cough, limiting him to 1 pint per month... His new Primary Care doctor is DrPaz. ~  SEE PREV EPIC NOTES FOR OLDER DATA >>   ~  June 15, 2012:  28mo ROV & Ronald King CC is fatigue, tired, no energy despite Androgel Rx and he saw DrPaz several weeks ago- Medicare annual wellness visit & review of medical problems...    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    Chronic Cough> managed for yrs w/ cough syrup, currently Phen Expect w/ Codeine limited to 1 pt per month; he denies sput, hemoptysis, SOB, CP, etc...    Hx upper airway resistance syndrome> followed by DrClance & seen 7/13- responded very well to CPAP despite a fairly neg Sleep Study in 1999; he continues on CPAP nightly & resting well, excellent daytime alertness, etc... We reviewed prob list, meds, xrays and labs> see below for updates >>   ~  January 04, 2013:  59mo ROV & Ronald King is here to refill his cough syrup; he notes fatigue & problem w/ Low-T and DrPaz had discussed Endocrine eval; pt tells me he's had mod difficulty w/ left knee & is set up to see Ortho next week... 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    Chronic Cough> managed for yrs w/ cough syrup, currently Phen Expect w/ Codeine limited to 1 pt per month; he denies sput, hemoptysis, SOB, CP, etc...    Hx upper airway resistance syndrome>  followed by DrClance & last seen 7/13- responded very well to CPAP despite a fairly neg Sleep Study in 1999; he continues on CPAP nightly & resting well, excellent daytime alertness, etc... We reviewed prob list, meds, xrays and labs> see below for updates >> he had the 2014 FLU vaccine and a Shingles vaccine 9/14...  ~  July 09, 2013:  79mo ROV and Ronald King has established primary care w/ DrPaz> here today for Pulm follow up & refill of his cough syrup...    AR/ recurrent sinusitis> on Zicam OTC & off Nasonex; he wants refill of prn Amox500Tid to use on his mission trips "when I need it for sinus"...    Chronic Cough> managed for yrs w/ cough syrup, prev Phen Expect w/ Codeine limited to 1 pt per month, but insurance Co wants change to Hycodan- ok; cough remains dry- he denies sput, hemoptysis, SOB, CP, etc...    Hx upper airway resistance syndrome> followed by DrClance & last seen 7/13- responded very well to CPAP despite a fairly neg Sleep Study in 1999; he continues on CPAP nightly & resting well, excellent daytime alertness, etc...    GERD> on Nexium40Bid + vigorous antireflux regimen (elev HOB 6", npo after dinner, etc); we reviewed LPR problems; he had Nissen fundoplication in 3557, last EGD was  2010 by DrDBrodie- no acute changes... We reviewed prob list, meds, xrays and labs>> he reports feeling better on Testos shots from [place], but he blames this for his inability to lose weight...  CXR 4/15 showed norm heart size, clear lungs, NAD...  ~   January 10, 2014:  65mo ROV & Ronald King reports a stable interval w/o new complaints or concerns;  He had a mission trip to [place] 8/15 & he did well but he notes amox didn't work as well as usual 7 he had to call for a ZPak on return;  He is followed by DrPaz for Primary Care, DrKerr for Endocrine, DrFeraru for Neurology, and DrSanders for Psyche...     AR/ recurrent sinusitis> on Zicam OTC & off Nasonex; he wants refill of prn Amox500Tid to use on his  mission trips and "when I need it for sinus"...    Chronic Cough> managed for yrs w/ cough syrup, prev Phen Expect w/ Codeine limited to 1 pt per month, but insurance Co wants change to Hycodan- ok; cough remains dry- he denies sput, hemoptysis, SOB, CP, etc...    Hx upper airway resistance syndrome> prev eval by DrClance- last seen 7/13- responded very well to CPAP despite a fairly neg Sleep Study in 1999; he continues on CPAP nightly & resting well, excellent daytime alertness, etc...    GERD> on Nexium40Bid + vigorous antireflux regimen (elev HOB 6", npo after dinner, etc); we reviewed LPR problems; he had Nissen fundoplication in 0175, last EGD was 2010 by DrDBrodie- no acute changes... We reviewed prob list, meds, xrays and labs> see below for updates >> OK 2015 flu vaccine today...            Problem List:       Hx of SINUSITIS >> prev on NASONEX 2spBid + he requests Rx for AMOXICILLIN to keep on hand for travel... prev eval DrByers- "he wanted to do surg"... "my sinuses are crazy" but he refuses to consider the surg... also had dysphonia eval at [facility] in the 90's w/ Botox tried.  OBSTRUCTIVE SLEEP APNEA, UPPER AIRWAY RESISTANCE SYNDROME>> he uses CPAP nightly... saw DrClance 12/09- he felt upper airway resistance syndrome... doing better w/ new CPAP machine in 2010. ~  7/13:  He had f/u DrClance & doing well, no changes made, continue same CPAP...  COUGH, CHRONIC >> as above... he had an extensive eval in the past w/ GI, ENT, Pulm, etc... he uses PHEN EXPECT w/ CODEINE 1 tsp Q6H up to 1 pint/mo... ~  Cough was no diff off ACE/ ARB in past & he does not want to change meds... ~  CXR 1/10 showed clear,  just min biapical pleural thickening... ~  2/11: states intol to Symbicort w/ HAs... try ADVAIR 250Bid... ~  CXR 2/11 showed clear lungs, NAD.Marland Kitchen. ~  CXR 9/12 showed chr bronchitic changes, NAD... ~  PFT 9/13 showed FVC=3.98 (76%), FEV1=3.46 (86%), %1sec=87, mid-flows=112% predicted...  ~  CXR  9/13 showed normal heart size, clear lungs, DJD spine... ~  4/15: his insurance company is requesting change from Phenergan expect w/ cod- we will write for Hycodan one tsp Q6h prn & continue to limit to 1pt/mo... ~  CXR 4/15 showed norm heart size, clear lungs, NAD...    FOLLOWED FOR PRIMARY CARE BY DrPAZ >>   HYPERTENSION (ICD-401.9) - meds adjusted by DrCrenshaw on NORVASC 10mg /d & HCTZ 25mg - 1/2 tab daily...  ~  8/12:  BP= 134/82 & denies HA, fatigue, visual changes, CP, palipit,  dizziness, syncope, dyspnea, edema, etc... ~  3/13:  BP= 124/76 & he denies CP, palpit, SOB, edema... ~  9/13:  BP= 110/66 & he remains asymptomatic... ~  He has established Primary Care f/u w/ DrPaz => on Amlod10, Hct12.5, & off prev Lisin; BP= 122/64 today.  DYSLIPIDEMIA (ICD-272.4) - on TRICOR 145mg /d but refuses Statin meds or Lipid Clinic referral... ~  FLP 1/10 showed TChol 161, TG 239, HDL 30, LDL 72... he wants to control w/ diet. ~  FLP 9/10 showed TChol 192, TG 426, HDL 30, LDL 80... rec> start [id number]. ~  [place] 2/11 showed TChol 173, TG 102, HDL 37, LDL 115... continue Tricor Rx + diet. ~  FLP 2/12 showed TChol 221, TG 249, HDL 31, LDL 140... Reminded to take med regularly & get wt down. ~  FLP 8/12 on Tric145 showed TChol 194, TG 169, HDL 32, LDL 128... Still not at goal, refuses statin med. ~  [place] 3/13 on Tric145 showed TChol 229, TG 177, HDL 35, LDL 140... rec to start CRESTOR20 + better low fat diet! ~  FLP 5/13 on Tric145+Cres20 showed TChol 151, TG 188, HDL 33, LDL 80... Continue same. ~  Lipids followed by DrPaz, now on Cres20 monotherapy => labs reviewed in EPIC...  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 discussed diet + exercise and the need for weight reduction both from his overall health status and his OSA... "I moderate what I eat". ~  weight 1/10 = 267# ~  weight 2/11 =  272#... discussed again! ~  Weight 2/12 = 276#... He is unconcerned! ~  Weight 8/12 = 274#... Reviewed diet & exercise needed. ~  Weight 3/13 = 274#... He is NOT trying. ~  Weight 9/13 = 272# ~  Weight 3/14 = 274# ~  Weight 10/14 = 280#  GERD (ICD-530.81) - on NEXIUM 40mg Bid & off the prev hs Reglan... s/p lap nissen 3/01 by DrMartin.... ~  EGD 11/05 showed prev Nissen, +gastritis... ~  EGD 6/10 by DrDBrodie showed functioning Nissen, post-op changes, sessile polyp in distal esoph= benign mucosa.  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 ~  f/u colonoscopy 6/10 by DrDBrodie showed negative x for int hems...  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  Finally started ANDROGEL 5gm packet Qd in 2012 ~  labs 1/10 showed PSA= 1.63 ~  labs 2/11 showed PSA= 1.65,  ~  Labs 1/12 showed Testosterone level = 160-205 ~  Labs 2/12 showed PSA = 1.63, Testosterone level = 553 ~  Labs 8/12 showed Testosterone level = 428... rec continue daily Androgel. ~  Labs 3/13 showed PSA= 1.73, Testosterone level = 185 & states he is using the Androgel Q3rd day, rec to incr to daily. ~  Follow up labs & med adjustments per DrPaz => he referred pt to Endocrine, DrKerr...  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  VITAMIN D DEFICIENCY (ICD-268.9) - on Vit D OTC 2000 u daily... ~  labs 1/10 showed Vit D level = 16... Vit D 2000 u daily started. ~  labs 2/11 showed Vit D level = 34... continue supplement. ~  Labs 8/12 showed Vit D level = 47... continue same.  HEADACHE (ICD-784.0) - Chr daily migraines  in the past w/ prev HA eval by DrFreeman (Botox shots helped in the past)...  NEUROPATHY (ICD-355.9) - c/o burning in legs and eval from Neuro in [facility] & Rx w/ Neurontin + Hydrocodone for pain... this may be part of a familial trait as  several relatives have severe periph neuropathy (?hereditary syndrome)- we don't have notes from [facility] neurology. ~  Pt reports that he was intol to Neurontin, Lyrica, & one other ("memory gaps") & he refused Cymbalta trial... ~  3/13:  Reviewed w/ pt> he continues on VICODIN 2.5-500 from DrGibson up to Tid, 90/mo w/ refills from him... ~  Now followed by [facility], DrFeraru on Vits and Hydrocodone 2.5mg  Bid as needed...   ANXIETY (ICD-300.00) - on PAXIL CR 25mg /d & KLONOPIN 1mg  Bid as needed... they were prev perscribed by ?psychiatrist? but he notes "I've been on these for >68yrs"... he still sees DrSanders for these prescriptions...   Past Surgical History  Procedure Laterality Date  . Laparoscopic nissen fundoplication  03/173    Dr. Hassell Done  . Knee arthroscopy  2007    Dr. Marlou Sa  . Tonsillectomy    . Nose surgery    . Anal fissure repair    . Hand fracture surgery      left    Outpatient Encounter Prescriptions as of 01/10/2014  Medication Sig  . amLODipine (NORVASC) 10 MG tablet take 1 tablet by mouth once daily  . amoxicillin (AMOXIL) 500 MG capsule Take 500 mg by mouth 2 (two) times daily. Prn bronchitis  . ascorbic acid (VITAMIN C) 500 MG tablet Take 1,000 mg by mouth daily.   Marland Kitchen aspirin 81 MG tablet Take 81 mg by mouth daily.    Marland Kitchen azithromycin (ZITHROMAX) 250 MG tablet Take as directed  . Calcium Carbonate-Vitamin D (RA CALCIUM PLUS VITAMIN D) 600-400 MG-UNIT per tablet Take 1 tablet by mouth daily.    . Cholecalciferol (VITAMIN D) 2000 UNITS tablet Take 2,000 Units by mouth daily.    . clonazePAM (KLONOPIN) 1 MG tablet Take 1 mg by mouth 2 (two) times daily as needed.    . clotrimazole (LOTRIMIN) 1 % cream Apply topically 2 (two) times daily.    . Coenzyme Q10 (CO Q 10 PO) Take by mouth.  . CRESTOR 20 MG tablet take 1 tablet by mouth once daily  . Cyanocobalamin (VITAMIN B-12) 2500 MCG SUBL Place 1 tablet under the tongue daily.    Marland Kitchen esomeprazole (NEXIUM) 40 MG capsule Take  1 capsule (40 mg total) by mouth 2 (two) times daily. 30 minutes before meals  . fenofibrate (TRICOR) 145 MG tablet Take 1 tablet (145 mg total) by mouth daily.  . Homeopathic Products (ZICAM COLD REMEDY) LIQD Take by mouth as needed.    . hydrochlorothiazide (HYDRODIURIL) 25 MG tablet take 1/2 tablet by mouth once daily  . HYDROcodone-acetaminophen (VICODIN) 2.5-500 MG per tablet Take 1 tablet by mouth 2 (two) times daily as needed.   Marland Kitchen HYDROcodone-homatropine (HYCODAN) 5-1.5 MG/5ML syrup Take 5 mLs by mouth every 6 (six) hours as needed for cough.  . Multiple Vitamins-Minerals (CENTRUM SILVER PO) Take 1 tablet by mouth daily.    Marland Kitchen PARoxetine (PAXIL-CR) 25 MG 24 hr tablet Take 25 mg by mouth every morning.    . Pyridoxine HCl (VITAMIN B-6 CR) 200 MG TBCR Take 1 tablet by mouth daily.    . rosuvastatin (CRESTOR) 20 MG tablet take 1 tablet by mouth every other day  . testosterone cypionate (DEPOTESTOTERONE CYPIONATE)  200 MG/ML injection Inject into the muscle every 14 (fourteen) days.    Allergies  Allergen Reactions  . Cefuroxime Axetil     REACTION: unsure of reaction  . Montelukast Sodium     REACTION: unsure of reaction    Current Medications, Allergies, Past Medical History, Past Surgical History, Family History, and Social History were reviewed in [employer] record.   Review of Systems        See HPI - all other systems neg except as noted...  The patient complains of dyspnea on exertion.  The patient denies anorexia, fever, weight loss, weight gain, vision loss, decreased hearing, hoarseness, chest pain, syncope, peripheral edema, prolonged cough, headaches, hemoptysis, abdominal pain, melena, hematochezia, severe indigestion/heartburn, hematuria, incontinence, muscle weakness, suspicious skin lesions, transient blindness, difficulty walking, depression, unusual weight change, abnormal bleeding, enlarged lymph nodes, and angioedema.     Objective:    Physical Exam      WD, Obese, 68 y/o WM in NAD... GENERAL:  Alert & oriented; pleasant & cooperative... HEENT:  [place]/AT, EOM-wnl, PERRLA, EACs-clear, TMs-wnl, NOSE-clear, THROAT-clear & wnl. NECK:  Supple w/ fairROM; no JVD; normal carotid impulses w/o bruits; no thyromegaly or nodules palpated; no lymphadenopathy. CHEST:  Clear to P & A; without wheezes/ rales/ or rhonchi; he has an intermittent dry irritative cough... HEART:  Regular Rhythm; without murmurs/ rubs/ or gallops. ABDOMEN:  Obese, soft & nontender; normal bowel sounds; no organomegaly or masses detected. EXT: without deformities, mild arthritic changes; no varicose veins/ venous insuffic/ or edema. NEURO:  CN's intact; motor testing normal; no focal neuro deficits DERM:  No lesions noted; no rash etc...  RADIOLOGY DATA:  Reviewed in the EPIC EMR & discussed w/ the patient...  LABORATORY DATA:  Reviewed in the EPIC EMR & discussed w/ the patient...   Assessment & Plan:    OSA, UARS>  He uses CPAP nightly, continue same...  Chronic Cough>  He is quite dependent on the cough syrup, limited to one pint per month; doesn't want to change meds but insurance won't fill the Phenergan expectorant; change to Hycodan- 1 tsp Q8h prn...   MEDICAL PROBLEMS FOLLOWED by Dahlia Bailiff, DrFERARU >>   HBP>  Controlled on CCB & Diuretic now, off prev ACE and cough is improved...  DYSLIPIDEMIA>  On Crestor, + diet per DrPaz; off prev Tricor rx......  Obesity>  We reviewed diet & exercise but he has been noncompliant w/ both...  GI> GERD, IBS, Hems>  Stable on NexiumBid, etc...  GU> BPH, ED, Hypogonadism>  Followed by DrPaz & Urology...  DJD>  Followed by DrDean w/ prev left knee arthroscopy...  Vit D Defic>  On OTC Vit D supplementation & ok...  NEUROPATHY>  Followed by DrFeraru in [place] & intol to Neurontin/ Lyrica, etc; on VICODIN 2.5-500 Tid per Neurology...  ANXIETY>  he still sees Psychologist, occupational, for his Paxil &  Klonopin...   Patient's Medications  New Prescriptions   No medications on file  Previous Medications   AMLODIPINE (NORVASC) 10 MG TABLET    take 1 tablet by mouth once daily   ASCORBIC ACID (VITAMIN C) 500 MG TABLET    Take 1,000 mg by mouth daily.    ASPIRIN 81 MG TABLET    Take 81 mg by mouth daily.     CALCIUM CARBONATE-VITAMIN D (RA CALCIUM PLUS VITAMIN D) 600-400 MG-UNIT PER TABLET    Take 1 tablet by mouth daily.     CHOLECALCIFEROL (VITAMIN D) 2000 UNITS TABLET  Take 2,000 Units by mouth daily.     CLONAZEPAM (KLONOPIN) 1 MG TABLET    Take 1 mg by mouth 2 (two) times daily as needed.     CLOTRIMAZOLE (LOTRIMIN) 1 % CREAM    Apply topically 2 (two) times daily.     COENZYME Q10 (CO Q 10 PO)    Take by mouth.   CRESTOR 20 MG TABLET    take 1 tablet by mouth once daily   CYANOCOBALAMIN (VITAMIN B-12) 2500 MCG SUBL    Place 1 tablet under the tongue daily.     ESOMEPRAZOLE (NEXIUM) 40 MG CAPSULE    Take 1 capsule (40 mg total) by mouth 2 (two) times daily. 30 minutes before meals   FENOFIBRATE (TRICOR) 145 MG TABLET    Take 1 tablet (145 mg total) by mouth daily.   HOMEOPATHIC PRODUCTS (ZICAM COLD REMEDY) LIQD    Take by mouth as needed.     HYDROCHLOROTHIAZIDE (HYDRODIURIL) 25 MG TABLET    take 1/2 tablet by mouth once daily   HYDROCODONE-ACETAMINOPHEN (VICODIN) 2.5-500 MG PER TABLET    Take 1 tablet by mouth 2 (two) times daily as needed.    MULTIPLE VITAMINS-MINERALS (CENTRUM SILVER PO)    Take 1 tablet by mouth daily.     PAROXETINE (PAXIL-CR) 25 MG 24 HR TABLET    Take 25 mg by mouth every morning.     PYRIDOXINE HCL (VITAMIN B-6 CR) 200 MG TBCR    Take 1 tablet by mouth daily.     ROSUVASTATIN (CRESTOR) 20 MG TABLET    take 1 tablet by mouth every other day   TESTOSTERONE CYPIONATE (DEPOTESTOTERONE CYPIONATE) 200 MG/ML INJECTION    Inject into the muscle every 14 (fourteen) days.  Modified Medications   Modified Medication Previous Medication   AMOXICILLIN (AMOXIL) 500 MG  CAPSULE amoxicillin (AMOXIL) 500 MG capsule      Take 1 capsule (500 mg total) by mouth 2 (two) times daily. Prn bronchitis    Take 500 mg by mouth 2 (two) times daily. Prn bronchitis   HYDROCODONE-HOMATROPINE (HYCODAN) 5-1.5 MG/5ML SYRUP HYDROcodone-homatropine (HYCODAN) 5-1.5 MG/5ML syrup      Take 5 mLs by mouth every 8 (eight) hours as needed for cough.    Take 5 mLs by mouth every 6 (six) hours as needed for cough.  Discontinued Medications   AZITHROMYCIN (ZITHROMAX) 250 MG TABLET    Take as directed

## 2014-02-28 ENCOUNTER — Other Ambulatory Visit: Payer: Self-pay | Admitting: Internal Medicine

## 2014-03-07 ENCOUNTER — Ambulatory Visit: Payer: Medicare Other | Admitting: Internal Medicine

## 2014-03-14 ENCOUNTER — Encounter: Payer: Self-pay | Admitting: Internal Medicine

## 2014-03-14 ENCOUNTER — Ambulatory Visit (HOSPITAL_BASED_OUTPATIENT_CLINIC_OR_DEPARTMENT_OTHER)
Admission: RE | Admit: 2014-03-14 | Discharge: 2014-03-14 | Disposition: A | Discharge status: Home / Self Care | Payer: Medicare Other | Source: Ambulatory Visit | Attending: Internal Medicine | Admitting: Internal Medicine

## 2014-03-14 ENCOUNTER — Ambulatory Visit (INDEPENDENT_AMBULATORY_CARE_PROVIDER_SITE_OTHER): Payer: Medicare Other | Admitting: Internal Medicine

## 2014-03-14 VITALS — BP 133/73 | HR 75 | Temp 97.9°F | Wt 282.5 lb

## 2014-03-14 DIAGNOSIS — I10 Essential (primary) hypertension: Secondary | ICD-10-CM

## 2014-03-14 DIAGNOSIS — M159 Polyosteoarthritis, unspecified: Secondary | ICD-10-CM

## 2014-03-14 DIAGNOSIS — M150 Primary generalized (osteo)arthritis: Secondary | ICD-10-CM

## 2014-03-14 DIAGNOSIS — M779 Enthesopathy, unspecified: Secondary | ICD-10-CM | POA: Diagnosis not present

## 2014-03-14 DIAGNOSIS — R102 Pelvic and perineal pain: Secondary | ICD-10-CM | POA: Diagnosis present

## 2014-03-14 NOTE — Assessment & Plan Note (Signed)
Continue amlodipine and hydrochlorothiazide, BP under good control. Labs from endocrinology from 4 months ago: Creatinine 1.2, potassium 4.1, CBC normal

## 2014-03-14 NOTE — Assessment & Plan Note (Signed)
Complain of pain at the "hip", in reality he is tender at the left iliac crest, will get x-ray, if not improving will need to see ortho

## 2014-03-14 NOTE — Progress Notes (Signed)
Subjective:    Patient ID: Ronald King, male    DOB: 09-25-1945, 68 y.o.   MRN: [id number]  DOS:  03/14/2014 Type of visit - description : rov Interval history: In general feeling well. Complaining of pain at the left iliac crest for several months, on and off. No radiation down the leg. Has chronic low back pain, at baseline, decrease w/ stretching. Neuropathy symptoms getting slightly worse, due to see neurology High cholesterol good medication compliance Hypertension, good medication compliance, no recent ambulatory BPs. BP today is very good. He sees endocrinology, labs from 10-2013 reviewed. Diabetes and HRT, per  endocrinology, under excellent control.     Past Medical History  Diagnosis Date  . Dyslipidemia   . Hypertension   . Esophageal polyp   . Anemia   . History of gastrointestinal hemorrhage   . OSA (obstructive sleep apnea)     on CPAP  . GERD (gastroesophageal reflux disease)     s/p [employer]  . Chronic bronchitis   . Chronic cough   . Hemorrhoids, internal   . DJD (degenerative joint disease)   . Vitamin D deficiency   . Neuropathy     per neuro-vicodin  . Diabetes 07-2011    per Dr Buddy Duty  . Hypogonadism, male     per Dr Buddy Duty  . Anxiety     Past Surgical History  Procedure Laterality Date  . Laparoscopic nissen fundoplication  05/1538    Dr. Hassell Done  . Knee arthroscopy  2007    Dr. Marlou Sa  . Tonsillectomy    . Nose surgery    . Anal fissure repair    . Hand fracture surgery      left    History   Social History  . Marital Status: Married    Spouse Name: N/A    Number of Children: 1  . Years of Education: N/A   Occupational History  . Technical brewer, retired     retired  . 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 Smoking status: Former Smoker -- 1.00 packs/day for 3 years    Quit date: 03/29/1965  . Smokeless tobacco: Never Used  . Alcohol Use: No  . Drug Use: No  . Sexual Activity:  Not on file   Other Topics Concern  . Not on file   Social History Narrative        Medication List       This list is accurate as of: 03/14/14 11:59 PM.  Always use your most recent med list.               amLODipine 10 MG tablet  Commonly known as:  NORVASC  take 1 tablet by mouth once daily     amoxicillin 500 MG capsule  Commonly known as:  AMOXIL  Take 1 capsule (500 mg total) by mouth 2 (two) times daily. Prn bronchitis     ascorbic acid 500 MG tablet  Commonly known as:  VITAMIN C  Take 1,000 mg by mouth daily.     aspirin 81 MG tablet  Take 81 mg by mouth daily.     B-12 2000 MCG Tabs  Take 1 tablet by mouth daily.     CENTRUM SILVER PO  Take 1 tablet by mouth daily.     clonazePAM 1 MG tablet  Commonly known as:  KLONOPIN  Take 1 mg by mouth 2 (two) times daily as  needed.     CO Q 10 PO  Take 1 tablet by mouth daily.     CRESTOR 20 MG tablet  Generic drug:  rosuvastatin  take 1 tablet by mouth every other day     esomeprazole 40 MG capsule  Commonly known as:  NEXIUM  Take 1 capsule (40 mg total) by mouth 2 (two) times daily. 30 minutes before meals     fenofibrate 145 MG tablet  Commonly known as:  TRICOR  take 1 tablet by mouth once daily     hydrochlorothiazide 25 MG tablet  Commonly known as:  HYDRODIURIL  take 1/2 tablet by mouth once daily     HYDROcodone-acetaminophen 2.5-500 MG per tablet  Commonly known as:  VICODIN  Take 1 tablet by mouth 2 (two) times daily as needed.     HYDROcodone-homatropine 5-1.5 MG/5ML syrup  Commonly known as:  HYCODAN  Take 5 mLs by mouth every 8 (eight) hours as needed for cough.     PARoxetine 25 MG 24 hr tablet  Commonly known as:  PAXIL-CR  Take 25 mg by mouth every morning.     Pyridoxine HCl 200 MG Tbcr  Take 1 tablet by mouth daily.     RA CALCIUM PLUS VITAMIN D 600-400 MG-UNIT per tablet  Generic drug:  Calcium Carbonate-Vitamin D  Take 1 tablet by mouth daily.     testosterone  cypionate 200 MG/ML injection  Commonly known as:  DEPOTESTOTERONE CYPIONATE  Inject into the muscle every 14 (fourteen) days.     Vitamin D 2000 UNITS tablet  Take 2,000 Units by mouth daily.     ZICAM COLD REMEDY Liqd  Take by mouth as needed.           Objective:   Physical Exam  Musculoskeletal:       Legs:  BP 133/73 mmHg  Pulse 75  Temp(Src) 97.9 F (36.6 C) (Oral)  Wt 282 lb 8 oz (128.141 kg)  SpO2 95% General -- alert, well-developed, NAD.  Lungs -- normal respiratory effort, no intercostal retractions, no accessory muscle use, and normal breath sounds.  Heart-- normal rate, regular rhythm, no murmur.   Extremities-- no pretibial edema bilaterally ; hip rotation normal, no TTP at either trochanteric bursa Neurologic--  alert & oriented X3. Speech normal, gait appropriate for age, strength symmetric and appropriate for age.  Psych-- Cognition and judgment appear intact. Cooperative with normal attention span and concentration. No anxious or depressed appearing.        Assessment & Plan:  Today , I spent more than  15  min with the patient: >50% of the time counseling regards need to do XRs for the mew problem (iliac crest pain) and also reviewing the chart and labs ordered by other providers , see a/p

## 2014-03-14 NOTE — Patient Instructions (Signed)
Stop by the first floor and get the XR   If the pain continue, please see Dr Marlou Sa   Please come back to the office in 6 months for a physical exam. Come back fasting

## 2014-03-14 NOTE — Progress Notes (Signed)
Pre visit review using our clinic review tool, if applicable. No additional management support is needed unless otherwise documented below in the visit note. 

## 2014-04-16 ENCOUNTER — Telehealth: Payer: Self-pay | Admitting: Pulmonary Disease

## 2014-04-16 MED ORDER — HYDROCODONE-HOMATROPINE 5-1.5 MG/5ML PO SYRP: 5.0000 mL | ORAL_SOLUTION | Freq: Three times a day (TID) | ORAL | Status: DC | PRN

## 2014-04-16 NOTE — Telephone Encounter (Signed)
Pt requesting refill for Hycodan cough syrup #480 ml.  Last filled 01/10/14 for #480 ml.  Please advise if ok to print rx.

## 2014-04-16 NOTE — Telephone Encounter (Signed)
Per SN---  Ok to refill the hycodan 480 ml  1 tsp every 8 hours as needed for cough.  No refills.

## 2014-04-16 NOTE — Telephone Encounter (Signed)
Patient calling back to confirm quantity 462ml of cough syrup, please call back at [phone] when ready to pick up.

## 2014-04-17 NOTE — Telephone Encounter (Signed)
rx has been signed by SN and i have called the pt and lmom to make him aware that this rx is ready to be picked up.  i  Have left the rx up front to be picked up today.

## 2014-04-17 NOTE — Telephone Encounter (Signed)
It says that you printed this prescription. Has this been done? Thanks!

## 2014-05-30 LAB — HM DIABETES FOOT EXAM

## 2014-05-30 LAB — PSA: PSA: 2.55

## 2014-06-06 LAB — BASIC METABOLIC PANEL
BUN: 15 mg/dL (ref 4–21)
CREATININE: 1.1 mg/dL (ref <=1.3)
Glucose: 121 mg/dL
Potassium: 3.8 mmol/L (ref 3.4–5.3)
SODIUM: 142 mmol/L (ref 137–147)

## 2014-06-06 LAB — HEPATIC FUNCTION PANEL
ALK PHOS: 45 U/L (ref 25–125)
ALT: 20 U/L (ref 10–40)
AST: 20 U/L (ref 14–40)
Bilirubin, Total: 0.6 mg/dL

## 2014-06-06 LAB — LIPID PANEL
Cholesterol: 161 mg/dL (ref 0–200)
HDL: 33 mg/dL — AB (ref 35–70)
LDL CALC: 107 mg/dL
LDl/HDL Ratio: 4.9
Triglycerides: 109 mg/dL (ref 40–160)

## 2014-06-06 LAB — CBC AND DIFFERENTIAL
HCT: 45 % (ref 41–53)
Hemoglobin: 14.8 g/dL (ref 13.5–17.5)
PLATELETS: 210 10*3/uL (ref 150–399)
WBC: 6.8 10*3/mL

## 2014-06-06 LAB — HEMOGLOBIN A1C: HEMOGLOBIN A1C: 6.3 % — AB (ref 4.0–6.0)

## 2014-07-11 ENCOUNTER — Encounter: Payer: Self-pay | Admitting: Pulmonary Disease

## 2014-07-11 ENCOUNTER — Ambulatory Visit (INDEPENDENT_AMBULATORY_CARE_PROVIDER_SITE_OTHER)
Admission: RE | Admit: 2014-07-11 | Discharge: 2014-07-11 | Disposition: A | Discharge status: Home / Self Care | Payer: Medicare Other | Source: Ambulatory Visit | Attending: Pulmonary Disease | Admitting: Pulmonary Disease

## 2014-07-11 ENCOUNTER — Ambulatory Visit (INDEPENDENT_AMBULATORY_CARE_PROVIDER_SITE_OTHER): Payer: Medicare Other | Admitting: Pulmonary Disease

## 2014-07-11 VITALS — BP 122/80 | HR 80 | Temp 97.0°F | Wt 280.0 lb

## 2014-07-11 DIAGNOSIS — R059 Cough, unspecified: Secondary | ICD-10-CM

## 2014-07-11 DIAGNOSIS — K219 Gastro-esophageal reflux disease without esophagitis: Secondary | ICD-10-CM

## 2014-07-11 DIAGNOSIS — I10 Essential (primary) hypertension: Secondary | ICD-10-CM | POA: Diagnosis not present

## 2014-07-11 DIAGNOSIS — R05 Cough: Secondary | ICD-10-CM

## 2014-07-11 DIAGNOSIS — G4733 Obstructive sleep apnea (adult) (pediatric): Secondary | ICD-10-CM

## 2014-07-11 DIAGNOSIS — J329 Chronic sinusitis, unspecified: Secondary | ICD-10-CM

## 2014-07-11 MED ORDER — AMOXICILLIN 500 MG PO CAPS: 500.0000 mg | ORAL_CAPSULE | Freq: Two times a day (BID) | ORAL | Status: DC

## 2014-07-11 MED ORDER — HYDROCODONE-HOMATROPINE 5-1.5 MG/5ML PO SYRP: 5.0000 mL | ORAL_SOLUTION | Freq: Three times a day (TID) | ORAL | Status: DC | PRN

## 2014-07-11 MED ORDER — AMLODIPINE BESYLATE 10 MG PO TABS: 10.0000 mg | ORAL_TABLET | Freq: Every day | ORAL | Status: DC

## 2014-07-11 NOTE — Progress Notes (Signed)
Subjective:    Patient ID: Ronald King, male    DOB: 19-Nov-1945, 69 y.o.   MRN: [id number]  HPI 69 y/o WM here for a follow up visit... he has a chronic cough x years and is dependent on codeine containing cough syrups... he was miserable when ENDAL HD went off the market and we had to substitute PHENERGAN EXPECTORANT w/ CODEINE - 1 tsp by mouth Q6H as needed for cough, limiting him to 1 pint per month; then his insurance company refused the Phenergan Expect & switched to [facility]... His new Primary Care doctor is DrPaz. ~  SEE PREV EPIC NOTES FOR OLDER DATA >>    PFT 11/2011 were essentially wnl w/ FVC=3.98 (76%), FEV1=3.46 (86%), %1sec=87, mid-flows are wnl at 112% predicted...   ~  January 04, 2013:  53mo ROV & Random is here to refill his cough syrup; he notes fatigue & problem w/ Low-T and DrPaz had discussed Endocrine eval; pt tells me he's had mod difficulty w/ left knee & is set up to see Ortho next week... 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    Chronic Cough> managed for yrs w/ cough syrup, currently Phen Expect w/ Codeine limited to 1 pt per month; he denies sput, hemoptysis, SOB, CP, etc...    Hx upper airway resistance syndrome> followed by DrClance & last seen 7/13- responded very well to CPAP despite a fairly neg Sleep Study in 1999; he continues on CPAP nightly & resting well, excellent daytime alertness, etc... We reviewed prob list, meds, xrays and labs> see below for updates >> he had the 2014 FLU vaccine and a Shingles vaccine 9/14...  ~  July 09, 2013:  2mo ROV and Ronald King has established primary care w/ DrPaz> here today for Pulm follow up & refill of his cough syrup...    AR/ recurrent sinusitis> on Zicam OTC & off Nasonex; he wants refill of prn Amox500Tid to use on his mission trips  "when I need it for sinus"...    Chronic Cough> managed for yrs w/ cough syrup, prev Phen Expect w/ Codeine limited to 1 pt per month, but insurance Co wants change to Hycodan- ok; cough remains dry- he denies sput, hemoptysis, SOB, CP, etc...    Hx upper airway resistance syndrome> followed by DrClance & last seen 7/13- responded very well to CPAP despite a fairly neg Sleep Study in 1999; he continues on CPAP nightly & resting well, excellent daytime alertness, etc...    GERD> on Nexium40Bid + vigorous antireflux regimen (elev HOB 6", npo after dinner, etc); we reviewed LPR problems; he had Nissen fundoplication in 3016, last EGD was 2010 by DrDBrodie- no acute changes... We reviewed prob list, meds, xrays and labs>> he reports feeling better on Testos shots from [place], but he blames this for his inability to lose weight...  CXR 4/15 showed norm heart size, clear lungs, NAD...  ~   January 10, 2014:  68mo ROV & Ronald King reports a stable interval w/o new complaints or concerns;  He had a mission trip to [place] 8/15 & he did well but he notes amox didn't work as well as usual & he had to call for a ZPak on return;  He is followed by DrPaz for Primary Care, DrKerr for Endocrine, DrFeraru for Neurology,  and DrSanders for Psyche (their meds are reviewed)...     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    Chronic Cough> managed for yrs w/ cough syrup, prev Phen Expect w/ Codeine limited to 1 pt per month, but insurance Co wants change to Hycodan- ok; cough remains dry- he denies sput, hemoptysis, SOB, CP, etc...    Hx upper airway resistance syndrome> prev eval by DrClance- last seen 7/13- responded very well to CPAP despite a fairly neg Sleep Study in 1999; he continues on CPAP nightly & resting well, excellent daytime alertness, etc...    GERD> on Nexium40Bid + vigorous antireflux regimen (elev HOB  6", npo after dinner, etc); we reviewed LPR problems; he had Nissen fundoplication in 4196, last EGD was 2010 by DrDBrodie- no acute changes... We reviewed prob list, meds, xrays and labs> see below for updates >> OK 2015 flu vaccine today...   ~  July 11, 2014:  90mo ROV & Ronald King reports a stable interval- his CC is left hip pain & DrPaz has rec Ortho eval (currently pending); His breathing is OK, chr cough w/o change, occurs in paroxysms, and he denies recent bronchitic infections etc;  He notes some SOB/DOE w/ yard work, but exercises in the gym 3d/wk ([employer]) & he keeps up well... He is followed by DrPaz for Primary Care, DrKerr for Endocrine, DrFeraru for Neurology, and DrSanders for Psyche...     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    Chronic Cough> managed for yrs w/ cough syrup, now Hycodan 1pt per month- 1tsp Q6h prn cough; cough remains dry- he denies sput, hemoptysis, SOB, CP, etc...    Hx upper airway resistance syndrome> prev eval by DrClance- last seen 7/13- responded very well to CPAP despite a fairly neg Sleep Study in 1999; he continues on CPAP nightly & resting well, excellent daytime alertness, etc...    GERD> on Nexium40Bid + vigorous antireflux regimen (elev HOB 6", npo after dinner, etc); we reviewed LPR problems; he had Nissen fundoplication in 2229, last EGD was 2010 by DrDBrodie- no acute changes...    Obese> his weight is up 3# to 280 lbs now- BMI= 36 and we reviewed diet, exercise, wt reduction strategies...  We reviewed prob list, meds, xrays and labs> see below for updates >> meds refilled per request...   LABS 3/16 by DrPaz> FLP- at goals;  Chems- ok x BS=121, A1c=6.3;  CBC- wnl...  CXR 06/2104 showed borderline heart size, clear lungs, NAD- DJD in Tspine...  (I personally reviewed the images in Epic)           Problem List:       Hx of SINUSITIS >> prev  on NASONEX 2spBid + he requests Rx for AMOXICILLIN to keep on hand for travel... prev eval DrByers- "he wanted to do surg"... "my sinuses are crazy" but he refuses to consider the surg... also had dysphonia eval at [facility] in the 90's w/ Botox tried.  OBSTRUCTIVE SLEEP APNEA, UPPER AIRWAY RESISTANCE SYNDROME>> he uses CPAP nightly... saw DrClance 12/09- he felt upper airway resistance syndrome... doing better w/ new CPAP machine in 2010. ~  7/13:  He had f/u DrClance & doing well, no changes made, continue same CPAP... ~  He remains stable  on the CPAP, rests satis, no daytime sleepiness issues, etc...  COUGH, CHRONIC >> as above... he had an extensive eval in the past w/ GI, ENT, Pulm, etc... he used ENDAL-HD, then  PHEN EXPECT w/ CODEINE, now HYCODAN 1tsp Q6h prn cough, lim to 1pt/mo (Med choice lim by his insurance). ~  Cough was no diff off ACE/ ARB in past & he does not want to change meds... ~  CXR 1/10 showed clear,  just min biapical pleural thickening... ~  2/11: states intol to Symbicort w/ HAs... try ADVAIR 250Bid... ~  CXR 2/11 showed clear lungs, NAD.Marland Kitchen. ~  CXR 9/12 showed chr bronchitic changes, NAD... ~  PFT 9/13 showed FVC=3.98 (76%), FEV1=3.46 (86%), %1sec=87, mid-flows=112% predicted...  ~  CXR 9/13 showed normal heart size, clear lungs, DJD spine... ~  4/15: his insurance company is requesting change from Phenergan expect w/ cod- we will write for Hycodan one tsp Q6h prn & continue to limit to 1pt/mo... ~  CXR 4/15 showed norm heart size, clear lungs, NAD...  ~  10/15: his insurance has again changed the cough syrup, this time from Phenergan expect w/ cod to [facility]- same rx 1tsp Q6h prn cough, lim to 1pt/mo... 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    FOLLOWED FOR PRIMARY CARE BY DrPAZ >>   HYPERTENSION (ICD-401.9) - meds adjusted by DrCrenshaw on NORVASC 10mg /d & HCTZ 25mg - 1/2 tab daily...  ~  8/12:  BP= 134/82 & denies HA, fatigue, visual  changes, CP, palipit, dizziness, syncope, dyspnea, edema, etc... ~  3/13:  BP= 124/76 & he denies CP, palpit, SOB, edema... ~  9/13:  BP= 110/66 & he remains asymptomatic... ~  He has established Primary Care f/u w/ DrPaz => on Amlod10, Hct12.5, & off prev Lisin; BP= 122/64 today.  DYSLIPIDEMIA (ICD-272.4) - on TRICOR 145mg /d but refuses Statin meds or Lipid Clinic referral... ~  FLP 1/10 showed TChol 161, TG 239, HDL 30, LDL 72... he wants to control w/ diet. ~  FLP 9/10 showed TChol 192, TG 426, HDL 30, LDL 80... rec> start [id number]. ~  [place] 2/11 showed TChol 173, TG 102, HDL 37, LDL 115... continue Tricor Rx + diet. ~  FLP 2/12 showed TChol 221, TG 249, HDL 31, LDL 140... Reminded to take med regularly & get wt down. ~  FLP 8/12 on Tric145 showed TChol 194, TG 169, HDL 32, LDL 128... Still not at goal, refuses statin med. ~  [place] 3/13 on Tric145 showed TChol 229, TG 177, HDL 35, LDL 140... rec to start CRESTOR20 + better low fat diet! ~  FLP 5/13 on Tric145+Cres20 showed TChol 151, TG 188, HDL 33, LDL 80... Continue same. ~  Lipids followed by DrPaz, now on Cres20 monotherapy => labs reviewed in EPIC...  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 discussed diet + exercise and the need for weight reduction both from his overall health status and his OSA... "I moderate what I eat". ~  weight 1/10 = 267# ~  weight 2/11 = 272#... discussed again! ~  Weight 2/12 = 276#... He is unconcerned! ~  Weight 8/12 = 274#... Reviewed diet & exercise needed. ~  Weight 3/13 = 274#... He is NOT trying. ~  Weight 9/13 = 272# ~  Weight 3/14 = 274# ~  Weight 10/14 =  280#  GERD (ICD-530.81) - on NEXIUM 40mg Bid & off the prev hs Reglan... s/p lap nissen 3/01 by DrMartin.... ~  EGD 11/05 showed prev Nissen, +gastritis... ~  EGD 6/10 by DrDBrodie showed functioning Nissen, post-op changes, sessile polyp  in distal esoph= benign mucosa.  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 ~  f/u colonoscopy 6/10 by DrDBrodie showed negative x for int hems...  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  Finally started ANDROGEL 5gm packet Qd in 2012 ~  labs 1/10 showed PSA= 1.63 ~  labs 2/11 showed PSA= 1.65,  ~  Labs 1/12 showed Testosterone level = 160-205 ~  Labs 2/12 showed PSA = 1.63, Testosterone level = 553 ~  Labs 8/12 showed Testosterone level = 428... rec continue daily Androgel. ~  Labs 3/13 showed PSA= 1.73, Testosterone level = 185 & states he is using the Androgel Q3rd day, rec to incr to daily. ~  Follow up labs & med adjustments per DrPaz => he referred pt to Endocrine, DrKerr... 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  VITAMIN D DEFICIENCY (ICD-268.9) - on Vit D OTC 2000 u daily... ~  labs 1/10 showed Vit D level = 16... Vit D 2000 u daily started. ~  labs 2/11 showed Vit D level = 34... continue supplement. ~  Labs 8/12 showed Vit D level = 47... continue same.  HEADACHE (ICD-784.0) - Chr daily migraines in the past w/ prev HA eval by DrFreeman (Botox shots helped in the past)...  NEUROPATHY (ICD-355.9) - c/o burning in legs and eval from Neuro in [facility] & Rx w/ Neurontin + Hydrocodone for pain... this may be part of a familial trait as several relatives have severe periph neuropathy (?hereditary syndrome)- we don't have notes from [facility] neurology. ~  Pt reports that he was intol to Neurontin, Lyrica, & one other ("memory gaps") & he refused Cymbalta trial... ~  3/13:  Reviewed w/ pt> he continues on VICODIN 2.5-500 from DrGibson up to Tid, 90/mo w/ refills from him... ~  Now followed by [facility]urology, DrFeraru on Vits and Hydrocodone 2.5mg  Bid as needed...   ANXIETY (ICD-300.00) - on PAXIL CR 25mg /d & KLONOPIN 1mg  Bid as needed... they were prev perscribed by ?psychiatrist? but he notes "I've been on these for >87yrs"... he still sees DrSanders for these prescriptions...   Past Surgical History  Procedure Laterality Date  . Laparoscopic nissen fundoplication  06/2704    Dr. Hassell Done  . Knee arthroscopy  2007    Dr. Marlou Sa  . Tonsillectomy    . Nose surgery    . Anal fissure repair    . Hand fracture surgery      left    Outpatient Encounter Prescriptions as of 07/11/2014  Medication Sig  . amLODipine (NORVASC) 10 MG tablet take 1 tablet by mouth once daily  . ascorbic acid (VITAMIN C) 500 MG tablet Take 1,000 mg by mouth daily.   Marland Kitchen aspirin 81 MG tablet Take 81 mg by mouth daily.    . Calcium Carbonate-Vitamin D (RA CALCIUM PLUS VITAMIN D) 600-400 MG-UNIT per tablet Take 1 tablet by mouth daily.    . Cholecalciferol (VITAMIN D) 2000 UNITS tablet Take 2,000 Units by mouth daily.    Marland Kitchen  clonazePAM (KLONOPIN) 1 MG tablet Take 1 mg by mouth 2 (two) times daily as needed.    . Coenzyme Q10 (CO Q 10 PO) Take 1 tablet by mouth daily.   . Cyanocobalamin (B-12) 2000 MCG TABS Take 1 tablet by mouth daily.  Marland Kitchen esomeprazole (NEXIUM) 40 MG capsule Take 1 capsule (40 mg total) by mouth 2 (two) times daily. 30 minutes before meals  . fenofibrate (TRICOR) 145 MG tablet take 1 tablet by mouth once daily  . Homeopathic Products (ZICAM COLD REMEDY) LIQD Take by mouth as needed.    . hydrochlorothiazide (HYDRODIURIL) 25 MG tablet take 1/2 tablet by mouth once daily  . HYDROcodone-acetaminophen (VICODIN) 2.5-500 MG per tablet Take 1 tablet by mouth 2 (two) times daily as needed.   Marland Kitchen HYDROcodone-homatropine (HYCODAN) 5-1.5 MG/5ML syrup Take 5 mLs by mouth every 8 (eight) hours as needed for cough.  . Multiple Vitamins-Minerals (CENTRUM SILVER PO) Take 1 tablet by mouth daily.    Marland Kitchen PARoxetine  (PAXIL-CR) 25 MG 24 hr tablet Take 25 mg by mouth every morning.    . Pyridoxine HCl (VITAMIN B-6 CR) 200 MG TBCR Take 1 tablet by mouth daily.    . rosuvastatin (CRESTOR) 20 MG tablet take 1 tablet by mouth every other day  . testosterone cypionate (DEPOTESTOTERONE CYPIONATE) 200 MG/ML injection Inject into the muscle every 14 (fourteen) days.  Marland Kitchen amoxicillin (AMOXIL) 500 MG capsule Take 1 capsule (500 mg total) by mouth 2 (two) times daily. Prn bronchitis (Patient not taking: Reported on 07/11/2014)    Allergies  Allergen Reactions  . Cefuroxime Axetil     REACTION: unsure of reaction  . Montelukast Sodium     REACTION: unsure of reaction    Current Medications, Allergies, Past Medical History, Past Surgical History, Family History, and Social History were reviewed in [employer] record.   Review of Systems        See HPI - all other systems neg except as noted...  The patient complains of cough, & dyspnea on exertion.  The patient denies anorexia, fever, weight loss, weight gain, vision loss, decreased hearing, hoarseness, chest pain, syncope, peripheral edema, headaches, hemoptysis, abdominal pain, melena, hematochezia, severe indigestion/heartburn, hematuria, incontinence, muscle weakness, suspicious skin lesions, transient blindness, difficulty walking, depression, unusual weight change, abnormal bleeding, enlarged lymph nodes, and angioedema.     Objective:   Physical Exam      WD, Obese, 69 y/o WM in NAD... GENERAL:  Alert & oriented; pleasant & cooperative... HEENT:  [place]/AT, EOM-wnl, PERRLA, EACs-clear, TMs-wnl, NOSE-clear, THROAT-clear & wnl. NECK:  Supple w/ fairROM; no JVD; normal carotid impulses w/o bruits; no thyromegaly or nodules palpated; no lymphadenopathy. CHEST:  Clear to P & A; without wheezes/ rales/ or rhonchi; he has an intermittent dry irritative cough... HEART:  Regular Rhythm; without murmurs/ rubs/ or gallops. ABDOMEN:  Obese, soft &  nontender; normal bowel sounds; no organomegaly or masses detected. EXT: without deformities, mild arthritic changes; no varicose veins/ venous insuffic/ or edema. NEURO:  CN's intact; motor testing normal; no focal neuro deficits DERM:  No lesions noted; no rash etc...  RADIOLOGY DATA:  Reviewed in the EPIC EMR & discussed w/ the patient...  LABORATORY DATA:  Reviewed in the EPIC EMR & discussed w/ the patient...   Assessment & Plan:    OSA, UARS>  He uses CPAP nightly, continue same...  Chronic Cough>  He is quite dependent on the cough syrup, limited to one pint per month; doesn't want  to change meds but insurance won't fill the Phenergan expectorant; change to Hycodan- 1 tsp Q8h prn...   MEDICAL PROBLEMS FOLLOWED by Dahlia Bailiff, DrFERARU >>   HBP>  Controlled on CCB & Diuretic now, off prev ACE and cough is improved...  DYSLIPIDEMIA>  On Crestor, Tricor + diet per DrPaz...   Obesity>  We reviewed diet & exercise but he has been noncompliant w/ both...  GI> GERD, IBS, Hems>  Stable on NexiumBid, etc...  GU> BPH, ED, Hypogonadism>  Followed by Lyn Hollingshead, DrKerr, & Urology...  DJD>  Followed by DrDean w/ prev left knee arthroscopy...  Vit D Defic>  On OTC Vit D supplementation & ok...  NEUROPATHY>  Followed by DrFeraru in [place] & intol to Neurontin/ Lyrica, etc; on VICODIN 2.5-500 Tid per Neurology...  ANXIETY>  he still sees Psychologist, occupational, for his Paxil & Klonopin...   Patient's Medications  New Prescriptions   No medications on file  Previous Medications   ASCORBIC ACID (VITAMIN C) 500 MG TABLET    Take 1,000 mg by mouth daily.    ASPIRIN 81 MG TABLET    Take 81 mg by mouth daily.     CALCIUM CARBONATE-VITAMIN D (RA CALCIUM PLUS VITAMIN D) 600-400 MG-UNIT PER TABLET    Take 1 tablet by mouth daily.     CHOLECALCIFEROL (VITAMIN D) 2000 UNITS TABLET    Take 2,000 Units by mouth daily.     CLONAZEPAM (KLONOPIN) 1 MG TABLET    Take 1 mg by mouth 2 (two) times daily  as needed.     COENZYME Q10 (CO Q 10 PO)    Take 1 tablet by mouth daily.    CYANOCOBALAMIN (B-12) 2000 MCG TABS    Take 1 tablet by mouth daily.   ESOMEPRAZOLE (NEXIUM) 40 MG CAPSULE    Take 1 capsule (40 mg total) by mouth 2 (two) times daily. 30 minutes before meals   FENOFIBRATE (TRICOR) 145 MG TABLET    take 1 tablet by mouth once daily   HOMEOPATHIC PRODUCTS (ZICAM COLD REMEDY) LIQD    Take by mouth as needed.     HYDROCHLOROTHIAZIDE (HYDRODIURIL) 25 MG TABLET    take 1/2 tablet by mouth once daily   HYDROCODONE-ACETAMINOPHEN (VICODIN) 2.5-500 MG PER TABLET    Take 1 tablet by mouth 2 (two) times daily as needed.    MULTIPLE VITAMINS-MINERALS (CENTRUM SILVER PO)    Take 1 tablet by mouth daily.     PAROXETINE (PAXIL-CR) 25 MG 24 HR TABLET    Take 25 mg by mouth every morning.     PYRIDOXINE HCL (VITAMIN B-6 CR) 200 MG TBCR    Take 1 tablet by mouth daily.     ROSUVASTATIN (CRESTOR) 20 MG TABLET    take 1 tablet by mouth every other day   TESTOSTERONE CYPIONATE (DEPOTESTOTERONE CYPIONATE) 200 MG/ML INJECTION    Inject into the muscle every 14 (fourteen) days.  Modified Medications   Modified Medication Previous Medication   AMLODIPINE (NORVASC) 10 MG TABLET amLODipine (NORVASC) 10 MG tablet      Take 1 tablet (10 mg total) by mouth daily.    take 1 tablet by mouth once daily   AMOXICILLIN (AMOXIL) 500 MG CAPSULE amoxicillin (AMOXIL) 500 MG capsule      Take 1 capsule (500 mg total) by mouth 2 (two) times daily. Prn bronchitis    Take 1 capsule (500 mg total) by mouth 2 (two) times daily. Prn bronchitis   HYDROCODONE-HOMATROPINE (HYCODAN) 5-1.5 MG/5ML  SYRUP HYDROcodone-homatropine (HYCODAN) 5-1.5 MG/5ML syrup      Take 5 mLs by mouth every 8 (eight) hours as needed for cough.    Take 5 mLs by mouth every 8 (eight) hours as needed for cough.  Discontinued Medications   No medications on file

## 2014-07-11 NOTE — Patient Instructions (Signed)
Today we updated your med list in our EPIC system...    Continue your current medications the same...  Today we did your follow up CXR...    We will contact you w/ the results when available...   We refilled the meds you requested...  Let's get on track w/ our diet 7 exercise program> the goal is to lose 15-20 lbs...  Call for any questions...  Let's plan a follow up visit in 55mo, sooner if needed for problems.Marland KitchenMarland Kitchen

## 2014-07-25 ENCOUNTER — Other Ambulatory Visit: Payer: Self-pay

## 2014-08-27 ENCOUNTER — Other Ambulatory Visit: Payer: Self-pay

## 2014-08-29 ENCOUNTER — Telehealth: Payer: Self-pay | Admitting: Internal Medicine

## 2014-08-29 NOTE — Telephone Encounter (Signed)
Relation to pt: self  Call back number: [phone] Pharmacy:  [place], [place] [address] [phone] (Phone) [phone] (Fax)         Reason for call:  Pt requesting a refill fenofibrate (TRICOR) 145 MG tablet

## 2014-08-30 MED ORDER — FENOFIBRATE 145 MG PO TABS: 145.0000 mg | ORAL_TABLET | Freq: Every day | ORAL | Status: DC

## 2014-08-30 NOTE — Telephone Encounter (Signed)
Fenofibrate refilled to [facility].

## 2014-09-11 ENCOUNTER — Telehealth: Payer: Self-pay | Admitting: *Deleted

## 2014-09-11 NOTE — Telephone Encounter (Signed)
Pre-Visit Call completed with patient and chart updated.   Pre-Visit Info documented in Specialty Comments under [employer].    

## 2014-09-12 ENCOUNTER — Ambulatory Visit (INDEPENDENT_AMBULATORY_CARE_PROVIDER_SITE_OTHER): Payer: Medicare Other | Admitting: Internal Medicine

## 2014-09-12 ENCOUNTER — Encounter: Payer: Self-pay | Admitting: Internal Medicine

## 2014-09-12 VITALS — BP 124/72 | HR 75 | Temp 98.5°F | Ht 76.0 in | Wt 282.4 lb

## 2014-09-12 DIAGNOSIS — I10 Essential (primary) hypertension: Secondary | ICD-10-CM

## 2014-09-12 DIAGNOSIS — R7303 Prediabetes: Secondary | ICD-10-CM

## 2014-09-12 DIAGNOSIS — Z0000 Encounter for general adult medical examination without abnormal findings: Secondary | ICD-10-CM

## 2014-09-12 DIAGNOSIS — F411 Generalized anxiety disorder: Secondary | ICD-10-CM

## 2014-09-12 DIAGNOSIS — E7800 Pure hypercholesterolemia, unspecified: Secondary | ICD-10-CM

## 2014-09-12 DIAGNOSIS — G629 Polyneuropathy, unspecified: Secondary | ICD-10-CM

## 2014-09-12 LAB — LIPID PANEL
CHOL/HDL RATIO: 5
Cholesterol: 155 mg/dL (ref 0–200)
HDL: 29.9 mg/dL — AB (ref >39.00)
LDL Cholesterol: 101 mg/dL — ABNORMAL HIGH (ref 0–99)
NONHDL: 125.1
Triglycerides: 120 mg/dL (ref 0.0–149.0)
VLDL: 24 mg/dL (ref 0.0–40.0)

## 2014-09-12 NOTE — Assessment & Plan Note (Signed)
Well-controlled, clonazepam prescribed elsewhere

## 2014-09-12 NOTE — Assessment & Plan Note (Signed)
Well-controlled, he has trace bilateral pretibial edema, for now continue with amlodipine and hydrochlorothiazide

## 2014-09-12 NOTE — Patient Instructions (Addendum)
Get your blood work before you leave       [place] cause injuries and can affect all age groups. It is possible to use preventive measures to significantly decrease the likelihood of falls. There are many simple measures which can make your home safer and prevent falls. OUTDOORS  Repair cracks and edges of walkways and driveways.  Remove high doorway thresholds.  Trim shrubbery on the main path into your home.  Have good outside lighting.  Clear walkways of tools, rocks, debris, and clutter.  Check that handrails are not broken and are securely fastened. Both sides of steps should have handrails.  Have leaves, snow, and ice cleared regularly.  Use sand or salt on walkways during winter months.  In the garage, clean up grease or oil spills. BATHROOM  Install night lights.  Install grab bars by the toilet and in the tub and shower.  Use non-skid mats or decals in the tub or shower.  Place a plastic non-slip stool in the shower to sit on, if needed.  Keep floors dry and clean up all water on the floor immediately.  Remove soap buildup in the tub or shower on a regular basis.  Secure bath mats with non-slip, double-sided rug tape.  Remove throw rugs and tripping hazards from the floors. BEDROOMS  Install night lights.  Make sure a bedside light is easy to reach.  Do not use oversized bedding.  Keep a telephone by your bedside.  Have a firm chair with side arms to use for getting dressed.  Remove throw rugs and tripping hazards from the floor. KITCHEN  Keep handles on pots and pans turned toward the center of the stove. Use back burners when possible.  Clean up spills quickly and allow time for drying.  Avoid walking on wet floors.  Avoid hot utensils and knives.  Position shelves so they are not too high or low.  Place commonly used objects within easy reach.  If necessary, use a sturdy step stool with a grab bar when  reaching.  Keep electrical cables out of the way.  Do not use floor polish or wax that makes floors slippery. If you must use wax, use non-skid floor wax.  Remove throw rugs and tripping hazards from the floor. STAIRWAYS  Never leave objects on stairs.  Place handrails on both sides of stairways and use them. Fix any loose handrails. Make sure handrails on both sides of the stairways are as long as the stairs.  Check carpeting to make sure it is firmly attached along stairs. Make repairs to worn or loose carpet promptly.  Avoid placing throw rugs at the top or bottom of stairways, or properly secure the rug with carpet tape to prevent slippage. Get rid of throw rugs, if possible.  Have an electrician put in a light switch at the top and bottom of the stairs. OTHER FALL PREVENTION TIPS  Wear low-heel or rubber-soled shoes that are supportive and fit well. Wear closed toe shoes.  When using a stepladder, make sure it is fully opened and both spreaders are firmly locked. Do not climb a closed stepladder.  Add color or contrast paint or tape to grab bars and handrails in your home. Place contrasting color strips on first and last steps.  Learn and use mobility aids as needed. Install an electrical emergency response system.  Turn on lights to avoid dark areas. Replace light bulbs that burn out immediately. Get light switches that glow.  Arrange furniture to create clear pathways. Keep furniture in the same place.  Firmly attach carpet with non-skid or double-sided tape.  Eliminate uneven floor surfaces.  Select a carpet pattern that does not visually hide the edge of steps.  Be aware of all pets. OTHER HOME SAFETY TIPS  Set the water temperature for 120 F (48.8 C).  Keep emergency numbers on or near the telephone.  Keep smoke detectors on every level of the home and near sleeping areas. Document Released: 03/05/2002 Document Revised: 09/14/2011 Document Reviewed:  06/04/2011 [facility] Patient Information 2015 [place], [place]. This information is not intended to replace advice given to you by your health care provider. Make sure you discuss any questions you have with your health care provider.   Preventive Care for Adults Ages 91 and over  Blood pressure check.** / Every 1 to 2 years.  Lipid and cholesterol check.**/ Every 5 years beginning at age 36.  Lung cancer screening. / Every year if you are aged 5-80 years and have a 30-pack-year history of smoking and currently smoke or have quit within the past 15 years. Yearly screening is stopped once you have quit smoking for at least 15 years or develop a health problem that would prevent you from having lung cancer treatment.  Fecal occult blood test (FOBT) of stool. / Every year beginning at age 36 and continuing until age 27. You may not have to do this test if you get a colonoscopy every 10 years.  Flexible sigmoidoscopy** or colonoscopy.** / Every 5 years for a flexible sigmoidoscopy or every 10 years for a colonoscopy beginning at age 79 and continuing until age 17.  Hepatitis C blood test.** / For all people born from [birth year] through 1964 and any individual with known risks for hepatitis C.  Abdominal aortic aneurysm (AAA) screening.** / A one-time screening for ages 62 to 9 years who are current or former smokers.  Skin self-exam. / Monthly.  Influenza vaccine. / Every year.  Tetanus, diphtheria, and acellular pertussis (Tdap/Td) vaccine.** / 1 dose of Td every 10 years.  Varicella vaccine.** / Consult your health care provider.  Zoster vaccine.** / 1 dose for adults aged 20 years or older.  Pneumococcal 13-valent conjugate (PCV13) vaccine.** / Consult your health care provider.  Pneumococcal polysaccharide (PPSV23) vaccine.** / 1 dose for all adults aged 91 years and older.  Meningococcal vaccine.** / Consult your health care provider.  Hepatitis A vaccine.** / Consult your health care  provider.  Hepatitis B vaccine.** / Consult your health care provider.  Haemophilus influenzae type b (Hib) vaccine.** / Consult your health care provider. **Family history and personal history of risk and conditions may change your health care provider's recommendations. Document Released: 05/11/2001 Document Revised: 03/20/2013 Document Reviewed: 08/10/2010 [facility] Patient Information 2015 [place], [place]. This information is not intended to replace advice given to you by your health care provider. Make sure you discuss any questions you have with your health care provider.

## 2014-09-12 NOTE — Assessment & Plan Note (Signed)
Saw endocrinology in March, A1c was 6.3

## 2014-09-12 NOTE — Assessment & Plan Note (Signed)
Symptoms seems well-controlled

## 2014-09-12 NOTE — Assessment & Plan Note (Addendum)
Td 2007 Pneumonia shot 2013 prevnar 2015 zostavax-- 11-2012  Cscope 08-2008, neg, next in 10 years, was rec hemocult q 2-3 years, IFOB 09-2013 neg PSA 05-2014 @ endocrinology 2.5  rec to stay active and eat healthy

## 2014-09-12 NOTE — Progress Notes (Signed)
Subjective:    Patient ID: Ronald King, male    DOB: 1945-06-23, 69 y.o.   MRN: [id number]  DOS:  09/12/2014 Type of visit - description :    Here for Medicare AWV: 1. Risk factors based on Past M, S, F history: reviewed 2. Physical Activities:  Gym x 3/week, some walking 3. Depression/mood:  symptoms well controlled with clonazepam and Paxil 4. Hearing: still  tinnitus, some hearing loss, saw ENT ~ 2013. Symptoms are stable 5. ADL's:  independent 6. Fall Risk: low risk, see instructions, AVS 7. home Safety: does feel safe at home   8. Height, weight, &visual acuity: see VS, uses glasses, sees eye doctor next month 9. Counseling: provided 10. Labs ordered based on risk factors: if needed   11. Referral Coordination: if needed 12.  Care Plan, see assessment and plan   13.   Cognitive Assessment: Motor skills and cognition seems intact. 14. Care team updated 15. End of life care discussed , has a healthcare POA (wife)  In addition, today we discussed the following: High cholesterol, good compliance of medication Prediabetes, hypogonadism: Follow-up by endocrinology, recent PSA normal, last A1c a few months ago 6.3. Hypertension, good med compliance, no ambulatory BPs but whenever he goes to see a doctor BPs are very good. Neuropathy, follow-up by neurology.   Review of Systems Constitutional: No fever. No chills. No unexplained wt changes. No unusual sweats  HEENT: No dental problems, no ear discharge, no facial swelling, no voice changes. No eye discharge, no eye  redness , no  intolerance to light   Respiratory: No wheezing , no  difficulty breathing. No cough , no mucus production  Cardiovascular: No CP, no leg swelling , no  Palpitations  GI: no nausea, no vomiting, no diarrhea , no  abdominal pain.  No blood in the stools. No dysphagia, no odynophagia    Endocrine: No polyphagia, no polyuria , no polydipsia  GU: No dysuria, gross hematuria, difficulty  urinating. No urinary urgency, no frequency.  Musculoskeletal: No joint swellings or unusual aches or pains  Skin: No change in the color of the skin, palor , no  Rash  Allergic, immunologic: No environmental allergies , no  food allergies  Neurological: No dizziness no  syncope. No headaches. No diplopia, no slurred, no slurred speech, no motor deficits, no facial  Numbness  Hematological: No enlarged lymph nodes, no easy bruising , no unusual bleedings  Psychiatry: No suicidal ideas, no hallucinations, no beavior problems, no confusion.  No unusual/severe anxiety, no depression    Past Medical History  Diagnosis Date  . Dyslipidemia   . Hypertension   . Esophageal polyp   . Anemia   . History of gastrointestinal hemorrhage   . OSA (obstructive sleep apnea)     on CPAP  . GERD (gastroesophageal reflux disease)     s/p [employer] F.  . Chronic bronchitis   . Chronic cough   . Hemorrhoids, internal   . DJD (degenerative joint disease)   . Vitamin D deficiency   . Neuropathy     per neuro-Dr Ferrarou vicodin  . Diabetes 07-2011    per Dr Buddy Duty  . Hypogonadism, male     per Dr Buddy Duty  . Anxiety     Past Surgical History  Procedure Laterality Date  . Laparoscopic nissen fundoplication  10/4663    Dr. Hassell Done  . Knee arthroscopy  2007    Dr. Marlou Sa  . Tonsillectomy    . Nose  surgery    . Anal fissure repair    . Hand fracture surgery      left    History   Social History  . Marital Status: Married    Spouse Name: N/A  . Number of Children: 1  . Years of Education: N/A   Occupational History  . Technical brewer, retired     retired  . 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 Smoking status: Former Smoker -- 1.00 packs/day for 3 years    Quit date: 03/29/1965  . Smokeless tobacco: Never Used  . Alcohol Use: No  . Drug Use: No  . Sexual Activity: Not on file   Other Topics Concern  . Not on file   Social History Narrative     Lives w/ wife     Family History  Problem Relation Age of Onset  . Cerebral aneurysm Father   . Alcohol abuse Father   . Neuropathy Mother   . Migraines Brother     several  . Diabetes Maternal Uncle   . Heart disease Brother     CABG at age 73  . Colon cancer Neg Hx   . Prostate cancer Neg Hx       Medication List       This list is accurate as of: 09/12/14  3:51 PM.  Always use your most recent med list.               amLODipine 10 MG tablet  Commonly known as:  NORVASC  Take 1 tablet (10 mg total) by mouth daily.     ascorbic acid 500 MG tablet  Commonly known as:  VITAMIN C  Take 1,000 mg by mouth daily.     aspirin 81 MG tablet  Take 81 mg by mouth daily.     B-12 2000 MCG Tabs  Take 1 tablet by mouth daily.     CENTRUM SILVER PO  Take 1 tablet by mouth daily.     clonazePAM 1 MG tablet  Commonly known as:  KLONOPIN  Take 1 mg by mouth 2 (two) times daily as needed.     CO Q 10 PO  Take 1 tablet by mouth daily.     CRESTOR 20 MG tablet  Generic drug:  rosuvastatin  take 1 tablet by mouth every other day     esomeprazole 40 MG capsule  Commonly known as:  NEXIUM  Take 1 capsule (40 mg total) by mouth 2 (two) times daily. 30 minutes before meals     fenofibrate 145 MG tablet  Commonly known as:  TRICOR  Take 1 tablet (145 mg total) by mouth daily.     hydrochlorothiazide 25 MG tablet  Commonly known as:  HYDRODIURIL  take 1/2 tablet by mouth once daily     HYDROcodone-acetaminophen 2.5-500 MG per tablet  Commonly known as:  VICODIN  Take 1 tablet by mouth 2 (two) times daily as needed.     PARoxetine 25 MG 24 hr tablet  Commonly known as:  PAXIL-CR  Take 25 mg by mouth every morning.     Pyridoxine HCl 200 MG Tbcr  Take 1 tablet by mouth daily.     RA CALCIUM PLUS VITAMIN D 600-400 MG-UNIT per tablet  Generic drug:  Calcium Carbonate-Vitamin D  Take 1 tablet by mouth daily.     testosterone cypionate 200 MG/ML injection  Commonly  known as:  DEPOTESTOSTERONE  CYPIONATE  Inject into the muscle every 14 (fourteen) days.     Vitamin D 2000 UNITS tablet  Take 2,000 Units by mouth daily.           Objective:   Physical Exam BP 124/72 mmHg  Pulse 75  Temp(Src) 98.5 F (36.9 C) (Oral)  Ht 6\' 4"  (1.93 m)  Wt 282 lb 6 oz (128.084 kg)  BMI 34.39 kg/m2  SpO2 92% General:   Well developed, well nourished . NAD.  Neck:  Full range of motion. Supple. No  thyromegaly , normal carotid pulse HEENT:  Normocephalic . Face symmetric, atraumatic Lungs:  CTA B Normal respiratory effort, no intercostal retractions, no accessory muscle use. Heart: RRR,  no murmur.  trace pretibial edema bilaterally  Abdomen:  Not distended, soft, non-tender. No rebound or rigidity. No mass,organomegaly Skin: Exposed areas without rash. Not pale. Not jaundice Neurologic:  alert & oriented X3.  Speech normal, gait appropriate for age and unassisted Strength symmetric and appropriate for age.  Psych: Cognition and judgment appear intact.  Cooperative with normal attention span and concentration.  Behavior appropriate. No anxious or depressed appearing.        Assessment & Plan:

## 2014-09-12 NOTE — Assessment & Plan Note (Addendum)
Last  LDL more than 100, recheck labs. Continue TriCor and Crestor

## 2014-09-12 NOTE — Progress Notes (Signed)
Pre visit review using our clinic review tool, if applicable. No additional management support is needed unless otherwise documented below in the visit note. 

## 2014-09-23 ENCOUNTER — Other Ambulatory Visit: Payer: Self-pay

## 2014-10-24 ENCOUNTER — Other Ambulatory Visit: Payer: Self-pay | Admitting: Internal Medicine

## 2014-10-24 NOTE — Telephone Encounter (Signed)
Last labs checked 09/12/14 and states to continue Crestor.  Rx refilled.

## 2014-11-22 ENCOUNTER — Other Ambulatory Visit: Payer: Self-pay | Admitting: Internal Medicine

## 2014-12-16 ENCOUNTER — Telehealth: Payer: Self-pay | Admitting: Pulmonary Disease

## 2014-12-16 MED ORDER — HYDROCODONE-HOMATROPINE 5-1.5 MG/5ML PO SYRP: 5.0000 mL | ORAL_SOLUTION | Freq: Three times a day (TID) | ORAL | Status: DC | PRN

## 2014-12-16 NOTE — Telephone Encounter (Signed)
Called and spoke to pt. Pt c/o chronic dry cough - at baseline. Pt denies discolored mucus, dyspnea beyond baseline, f/c/s, CP/tightness. Pt requesting hycodan cough syrup last filled in 06/2014 and last seen by SN on 06/2014.   Per SN- ok to refill Hycodan as filled before (# 4101ml, 0 refills, q6h prn).   Rx printed, signed, and placed up front for pick up. Pt aware. Nothing further needed at this time.

## 2015-01-09 ENCOUNTER — Ambulatory Visit (INDEPENDENT_AMBULATORY_CARE_PROVIDER_SITE_OTHER): Payer: Medicare Other

## 2015-01-09 DIAGNOSIS — Z23 Encounter for immunization: Secondary | ICD-10-CM | POA: Diagnosis not present

## 2015-01-16 ENCOUNTER — Encounter: Payer: Self-pay | Admitting: Pulmonary Disease

## 2015-01-16 ENCOUNTER — Ambulatory Visit (INDEPENDENT_AMBULATORY_CARE_PROVIDER_SITE_OTHER): Payer: Medicare Other | Admitting: Pulmonary Disease

## 2015-01-16 VITALS — BP 138/74 | HR 81 | Temp 99.0°F | Wt 277.2 lb

## 2015-01-16 DIAGNOSIS — K2100 Gastro-esophageal reflux disease with esophagitis, without bleeding: Secondary | ICD-10-CM

## 2015-01-16 DIAGNOSIS — R05 Cough: Secondary | ICD-10-CM

## 2015-01-16 DIAGNOSIS — I10 Essential (primary) hypertension: Secondary | ICD-10-CM | POA: Diagnosis not present

## 2015-01-16 DIAGNOSIS — R053 Chronic cough: Secondary | ICD-10-CM | POA: Insufficient documentation

## 2015-01-16 DIAGNOSIS — G4733 Obstructive sleep apnea (adult) (pediatric): Secondary | ICD-10-CM

## 2015-01-16 DIAGNOSIS — E669 Obesity, unspecified: Secondary | ICD-10-CM

## 2015-01-16 DIAGNOSIS — J329 Chronic sinusitis, unspecified: Secondary | ICD-10-CM

## 2015-01-16 MED ORDER — HYDROCODONE-HOMATROPINE 5-1.5 MG/5ML PO SYRP: 5.0000 mL | ORAL_SOLUTION | Freq: Four times a day (QID) | ORAL | Status: DC | PRN

## 2015-01-16 NOTE — Patient Instructions (Signed)
Today we updated your med list in our EPIC system...    Continue your current medications the same...  We refilled your [facility] for your chronic cough...  Add MUCINEX 1200mg  twice daily & plenty of fluids for the chest congestion...  Call for any questions...  Let's plan a follow up visit in 67mo, sooner if needed for problems.Marland KitchenMarland Kitchen

## 2015-01-16 NOTE — Progress Notes (Signed)
Subjective:    Patient ID: Ronald King, male    DOB: 1945/10/14, 69 y.o.   MRN: [id number]  HPI 69 y/o WM here for a follow up visit... he has a chronic cough x years and is dependent on codeine containing cough syrups... he was miserable when ENDAL HD went off the market and we had to substitute PHENERGAN EXPECTORANT w/ CODEINE - 1 tsp by mouth Q6H as needed for cough, limiting him to 1 pint per month; then his insurance company refused the Phenergan Expect & switched to [facility]... His new Primary Care doctor is DrPaz. ~  SEE PREV EPIC NOTES FOR OLDER DATA >>    PFT 11/2011 were essentially wnl w/ FVC=3.98 (76%), FEV1=3.46 (86%), %1sec=87, mid-flows are wnl at 112% predicted...   ~  January 04, 2013:  76mo ROV & Ronald King is here to refill his cough syrup; he notes fatigue & problem w/ Low-T and DrPaz had discussed Endocrine eval; pt tells me he's had mod difficulty w/ left knee & is set up to see Ortho next week... 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    Chronic Cough> managed for yrs w/ cough syrup, currently Phen Expect w/ Codeine limited to 1 pt per month; he denies sput, hemoptysis, SOB, CP, etc...    Hx upper airway resistance syndrome> followed by DrClance & last seen 7/13- responded very well to CPAP despite a fairly neg Sleep Study in 1999; he continues on CPAP nightly & resting well, excellent daytime alertness, etc... We reviewed prob list, meds, xrays and labs> see below for updates >> he had the 2014 FLU vaccine and a Shingles vaccine 9/14...  ~  July 09, 2013:  26mo ROV and Ronald King has established primary care w/ DrPaz> here today for Pulm follow up & refill of his cough syrup...    AR/ recurrent sinusitis> on Zicam OTC & off Nasonex; he wants refill of prn Amox500Tid to use on his mission trips  "when I need it for sinus"...    Chronic Cough> managed for yrs w/ cough syrup, prev Phen Expect w/ Codeine limited to 1 pt per month, but insurance Co wants change to Hycodan- ok; cough remains dry- he denies sput, hemoptysis, SOB, CP, etc...    Hx upper airway resistance syndrome> followed by DrClance & last seen 7/13- responded very well to CPAP despite a fairly neg Sleep Study in 1999; he continues on CPAP nightly & resting well, excellent daytime alertness, etc...    GERD> on Nexium40Bid + vigorous antireflux regimen (elev HOB 6", npo after dinner, etc); we reviewed LPR problems; he had Nissen fundoplication in 3729, last EGD was 2010 by DrDBrodie- no acute changes... We reviewed prob list, meds, xrays and labs>> he reports feeling better on Testos shots from [place], but he blames this for his inability to lose weight...  CXR 4/15 showed norm heart size, clear lungs, NAD...  ~   January 10, 2014:  21mo ROV & Ronald King reports a stable interval w/o new complaints or concerns;  He had a mission trip to [place] 8/15 & he did well but he notes amox didn't work as well as usual & he had to call for a ZPak on return;  He is followed by DrPaz for Primary Care, DrKerr for Endocrine, DrFeraru for Neurology,  and DrSanders for Psyche (their meds are reviewed)...     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    Chronic Cough> managed for yrs w/ cough syrup, prev Phen Expect w/ Codeine limited to 1 pt per month, but insurance Co wants change to Hycodan- ok; cough remains dry- he denies sput, hemoptysis, SOB, CP, etc...    Hx upper airway resistance syndrome> prev eval by DrClance- last seen 7/13- responded very well to CPAP despite a fairly neg Sleep Study in 1999; he continues on CPAP nightly & resting well, excellent daytime alertness, etc...    GERD> on Nexium40Bid + vigorous antireflux regimen (elev HOB  6", npo after dinner, etc); we reviewed LPR problems; he had Nissen fundoplication in 0177, last EGD was 2010 by DrDBrodie- no acute changes... We reviewed prob list, meds, xrays and labs> see below for updates >> OK 2015 flu vaccine today...   ~  July 11, 2014:  60mo ROV & Ronald King reports a stable interval- his CC is left hip pain & DrPaz has rec Ortho eval (currently pending); His breathing is OK, chr cough w/o change, occurs in paroxysms, and he denies recent bronchitic infections etc;  He notes some SOB/DOE w/ yard work, but exercises in the gym 3d/wk ([place]) & he keeps up well... He is followed by DrPaz for Primary Care, DrKerr for Endocrine, DrFeraru for Neurology, and DrSanders for Psyche...     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    Chronic Cough> managed for yrs w/ cough syrup, now Hycodan 1pt per month- 1tsp Q6h prn cough; cough remains dry- he denies sput, hemoptysis, SOB, CP, etc...    Hx upper airway resistance syndrome> prev eval by DrClance- last seen 7/13- responded very well to CPAP despite a fairly neg Sleep Study in 1999; he continues on CPAP nightly & resting well, excellent daytime alertness, etc...    GERD> on Nexium40Bid + vigorous antireflux regimen (elev HOB 6", npo after dinner, etc); we reviewed LPR problems; he had Nissen fundoplication in 9390, last EGD was 2010 by DrDBrodie- no acute changes...    Obese> his weight is up 3# to 280 lbs now- BMI= 36 and we reviewed diet, exercise, wt reduction strategies...  We reviewed prob list, meds, xrays and labs> see below for updates >> meds refilled per request...   LABS 3/16 by DrPaz> FLP- at goals;  Chems- ok x BS=121, A1c=6.3;  CBC- wnl...  CXR 06/2104 showed borderline heart size, clear lungs, NAD- DJD in Tspine...  (I personally reviewed the images in Epic)   ~  January 16, 2015:  46mo ROV & Ronald King states "my lungs  are acting up this month" c/o incr cough, chest congestion, but denies sput/ CP/ reflux or burning; he denies SOB & states his chr DOE is stable, no change; He wants his hydrocodone cough syrup refilled;  He notes that he was getting tired w/ DOE at [place] started on Testos shots w/ improvement & feeling good now he says...    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    Chronic Cough> managed for yrs w/ cough syrup, now Hycodan  1pt per month- 1tsp Q6h prn cough; cough remains dry- he denies sput, hemoptysis, SOB, CP, etc...    Hx upper airway resistance syndrome> prev eval by DrClance- last seen 7/13- responded very well to CPAP despite a fairly neg Sleep Study in 1999; he continues on CPAP nightly & resting well, excellent daytime alertness, etc...    GERD> on Nexium40Bid + vigorous antireflux regimen (elev HOB 6", npo after dinner, etc); we reviewed LPR problems; he had Nissen fundoplication in 0160, last EGD was 2010 by DrDBrodie- no acute changes...    Obese> his weight is down 3# to 277 lbs now- BMI= 36 and we reviewed diet, exercise, wt reduction strategies...     Hyperlipidemia>  On Crestor20 and Tricor145 => note Feno145 was started in 2010 when TG was >400 & Cres20 added by DrPaz in 2014; suggest that he stop the Fenofibrate at this time.    Low-T>  He is on Depo Testos 100mg  Q2wks per DrKerr, Endocrine (seen 11/2014 w/ Low-T, prediabetes, low bone mass & low VitD- note reviewed)    Dysthymia>  On Paxil-CR 25 and Klonopin 1mg  Bid... We reviewed prob list, meds, xrays and labs> see below for updates >> meds refilled per request, his PCP is DrPaz... IMP/PLAN>>  We discussed adding the MUCINEX 1200mg  bid + fluids;  We refilled his HYCODAN 1pt- 1tsp Q6H prn cough...           Problem List:       Hx of SINUSITIS >> prev on NASONEX 2spBid + he requests Rx for AMOXICILLIN to keep on hand for  travel... prev eval DrByers- "he wanted to do surg"... "my sinuses are crazy" but he refuses to consider the surg... also had dysphonia eval at [facility] in the 90's w/ Botox tried.  OBSTRUCTIVE SLEEP APNEA, UPPER AIRWAY RESISTANCE SYNDROME>> he uses CPAP nightly... saw DrClance 12/09- he felt upper airway resistance syndrome... doing better w/ new CPAP machine in 2010. ~  7/13:  He had f/u DrClance & doing well, no changes made, continue same CPAP... ~  He remains stable on the CPAP, rests satis, no daytime sleepiness issues, etc...  COUGH, CHRONIC >> as above... he had an extensive eval in the past w/ GI, ENT, Pulm, etc... he used ENDAL-HD, then  PHEN EXPECT w/ CODEINE, now HYCODAN 1tsp Q6h prn cough, lim to 1pt/mo (Med choice lim by his insurance). ~  Cough was no diff off ACE/ ARB in past & he does not want to change meds... ~  CXR 1/10 showed clear,  just min biapical pleural thickening... ~  2/11: states intol to Symbicort w/ HAs... try ADVAIR 250Bid... ~  CXR 2/11 showed clear lungs, NAD.Marland Kitchen. ~  CXR 9/12 showed chr bronchitic changes, NAD... ~  PFT 9/13 showed FVC=3.98 (76%), FEV1=3.46 (86%), %1sec=87, mid-flows=112% predicted...  ~  CXR 9/13 showed normal heart size, clear lungs, DJD spine... ~  4/15: his insurance company is requesting change from Phenergan expect w/ cod- we will write for Hycodan one tsp Q6h prn & continue to limit to 1pt/mo... ~  CXR 4/15 showed norm heart size, clear lungs, NAD...  ~  10/15: his insurance has again changed the cough syrup, this time from Phenergan expect w/ cod to [facility]- same rx 1tsp Q6h prn cough, lim to 1pt/mo... 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    FOLLOWED FOR PRIMARY CARE BY DrPAZ >>   HYPERTENSION (ICD-401.9) - meds adjusted by DrCrenshaw on [facility] 10mg /d &  HCTZ 25mg - 1/2 tab daily...  ~  8/12:  BP= 134/82 & denies HA, fatigue, visual changes, CP, palipit, dizziness, syncope, dyspnea, edema, etc... ~   3/13:  BP= 124/76 & he denies CP, palpit, SOB, edema... ~  9/13:  BP= 110/66 & he remains asymptomatic... ~  He has established Primary Care f/u w/ DrPaz => on Amlod10, Hct12.5, & off prev Lisin; BP= 122/64 today.  DYSLIPIDEMIA (ICD-272.4) - on TRICOR 145mg /d but refuses Statin meds or Lipid Clinic referral... ~  FLP 1/10 showed TChol 161, TG 239, HDL 30, LDL 72... he wants to control w/ diet. ~  FLP 9/10 showed TChol 192, TG 426, HDL 30, LDL 80... rec> start [id number]. ~  [place] 2/11 showed TChol 173, TG 102, HDL 37, LDL 115... continue Tricor Rx + diet. ~  FLP 2/12 showed TChol 221, TG 249, HDL 31, LDL 140... Reminded to take med regularly & get wt down. ~  FLP 8/12 on Tric145 showed TChol 194, TG 169, HDL 32, LDL 128... Still not at goal, refuses statin med. ~  [place] 3/13 on Tric145 showed TChol 229, TG 177, HDL 35, LDL 140... rec to start CRESTOR20 + better low fat diet! ~  FLP 5/13 on Tric145+Cres20 showed TChol 151, TG 188, HDL 33, LDL 80... Continue same. ~  Lipids followed by DrPaz, now on Cres20 monotherapy => labs reviewed in EPIC...  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 discussed diet + exercise and the need for weight reduction both from his overall health status and his OSA... "I moderate what I eat". ~  weight 1/10 = 267# ~  weight 2/11 = 272#... discussed again! ~  Weight 2/12 = 276#... He is unconcerned! ~  Weight 8/12 = 274#... Reviewed diet & exercise needed. ~  Weight 3/13 = 274#... He is NOT trying. ~  Weight 9/13 = 272# ~  Weight 3/14 = 274# ~  Weight 10/14 = 280#  GERD (ICD-530.81) - on NEXIUM 40mg Bid & off the prev hs Reglan... s/p lap nissen 3/01 by DrMartin.... ~  EGD 11/05 showed prev Nissen, +gastritis... ~  EGD 6/10 by DrDBrodie showed functioning Nissen, post-op changes, sessile polyp in distal esoph= benign mucosa.  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 ~  f/u colonoscopy 6/10 by DrDBrodie showed negative x for int hems...  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  Finally started ANDROGEL 5gm packet Qd in 2012 ~  labs 1/10 showed PSA= 1.63 ~  labs 2/11 showed PSA= 1.65,  ~  Labs 1/12 showed Testosterone level = 160-205 ~  Labs 2/12 showed PSA = 1.63, Testosterone level = 553 ~  Labs 8/12 showed Testosterone level = 428... rec continue daily Androgel. ~  Labs 3/13 showed PSA= 1.73, Testosterone level = 185 & states he is using the Androgel Q3rd day, rec to incr to daily. ~  Follow up labs & med adjustments per DrPaz => he referred pt to Endocrine, DrKerr... 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  VITAMIN D DEFICIENCY (ICD-268.9) - on Vit D OTC 2000 u daily... ~  labs 1/10 showed Vit D  level = 16... Vit D 2000 u daily started. ~  labs 2/11 showed Vit D level = 34... continue supplement. ~  Labs 8/12 showed Vit D level = 47... continue same.  HEADACHE (ICD-784.0) - Chr daily migraines in the past w/ prev HA eval by DrFreeman (Botox shots helped in the past)...  NEUROPATHY (ICD-355.9) - c/o burning in legs and eval from Neuro in [facility] & Rx w/ Neurontin + Hydrocodone for pain... this may be part of a familial trait as several relatives have severe periph neuropathy (?hereditary syndrome)- we don't have notes from [facility] neurology. ~  Pt reports that he was intol to Neurontin, Lyrica, & one other ("memory gaps") & he refused Cymbalta trial... ~  3/13:  Reviewed w/ pt> he continues on VICODIN 2.5-500 from DrGibson up to Tid, 90/mo w/ refills from him... ~  Now followed by [facility], DrFeraru on Vits and Hydrocodone 2.5mg  Bid as  needed...   ANXIETY (ICD-300.00) - on PAXIL CR 25mg /d & KLONOPIN 1mg  Bid as needed... they were prev perscribed by ?psychiatrist? but he notes "I've been on these for >10yrs"... he still sees DrSanders for these prescriptions...   Past Surgical History  Procedure Laterality Date  . Laparoscopic nissen fundoplication  11/5186    Dr. Hassell Done  . Knee arthroscopy  2007    Dr. Marlou Sa  . Tonsillectomy    . Nose surgery    . Anal fissure repair    . Hand fracture surgery      left    Outpatient Encounter Prescriptions as of 01/16/2015  Medication Sig  . amLODipine (NORVASC) 10 MG tablet Take 1 tablet (10 mg total) by mouth daily.  Marland Kitchen ascorbic acid (VITAMIN C) 500 MG tablet Take 1,000 mg by mouth daily.   Marland Kitchen aspirin 81 MG tablet Take 81 mg by mouth daily.    . Calcium Carbonate-Vitamin D (RA CALCIUM PLUS VITAMIN D) 600-400 MG-UNIT per tablet Take 1 tablet by mouth daily.    . Cholecalciferol (VITAMIN D) 2000 UNITS tablet Take 2,000 Units by mouth daily.    . clonazePAM (KLONOPIN) 1 MG tablet Take 1 mg by mouth 2 (two) times daily as needed.    . Coenzyme Q10 (CO Q 10 PO) Take 1 tablet by mouth daily.   . Cyanocobalamin (B-12) 2000 MCG TABS Take 1 tablet by mouth daily.  Marland Kitchen esomeprazole (NEXIUM) 40 MG capsule Take 1 capsule (40 mg total) by mouth 2 (two) times daily. 30 minutes before meals  . fenofibrate (TRICOR) 145 MG tablet Take 1 tablet (145 mg total) by mouth daily.  . hydrochlorothiazide (HYDRODIURIL) 25 MG tablet Take 0.5 tablets (12.5 mg total) by mouth daily.  Marland Kitchen HYDROcodone-acetaminophen (VICODIN) 2.5-500 MG per tablet Take 1 tablet by mouth 2 (two) times daily as needed.   Marland Kitchen HYDROcodone-homatropine (HYCODAN) 5-1.5 MG/5ML syrup Take 5 mLs by mouth every 6 (six) hours as needed for cough.  . Multiple Vitamins-Minerals (CENTRUM SILVER PO) Take 1 tablet by mouth daily.    Marland Kitchen PARoxetine (PAXIL-CR) 25 MG 24 hr tablet Take 25 mg by mouth every morning.    . Pyridoxine HCl (VITAMIN B-6 CR) 200  MG TBCR Take 1 tablet by mouth daily.    . rosuvastatin (CRESTOR) 20 MG tablet take 1 tablet by mouth every other day  . testosterone cypionate (DEPOTESTOTERONE CYPIONATE) 200 MG/ML injection Inject into the muscle every 14 (fourteen) days.  . [DISCONTINUED] CRESTOR 20 MG tablet take 1 tablet by mouth once daily (Patient taking  differently: take 1 tablet by mouth once every other day)  . [DISCONTINUED] HYDROcodone-homatropine (HYCODAN) 5-1.5 MG/5ML syrup Take 5 mLs by mouth every 8 (eight) hours as needed for cough.   No facility-administered encounter medications on file as of 01/16/2015.    Allergies  Allergen Reactions  . Cefuroxime Axetil     REACTION: unsure of reaction  . Montelukast Sodium     REACTION: unsure of reaction    Current Medications, Allergies, Past Medical History, Past Surgical History, Family History, and Social History were reviewed in [employer] record.   Review of Systems        See HPI - all other systems neg except as noted...  The patient complains of cough, & dyspnea on exertion.  The patient denies anorexia, fever, weight loss, weight gain, vision loss, decreased hearing, hoarseness, chest pain, syncope, peripheral edema, headaches, hemoptysis, abdominal pain, melena, hematochezia, severe indigestion/heartburn, hematuria, incontinence, muscle weakness, suspicious skin lesions, transient blindness, difficulty walking, depression, unusual weight change, abnormal bleeding, enlarged lymph nodes, and angioedema.     Objective:   Physical Exam      WD, Obese, 69 y/o WM in NAD... GENERAL:  Alert & oriented; pleasant & cooperative... HEENT:  [place]/AT, EOM-wnl, PERRLA, EACs-clear, TMs-wnl, NOSE-clear, THROAT-clear & wnl. NECK:  Supple w/ fairROM; no JVD; normal carotid impulses w/o bruits; no thyromegaly or nodules palpated; no lymphadenopathy. CHEST:  Clear to P & A; without wheezes/ rales/ or rhonchi; he has an intermittent dry irritative  cough... HEART:  Regular Rhythm; without murmurs/ rubs/ or gallops. ABDOMEN:  Obese, soft & nontender; normal bowel sounds; no organomegaly or masses detected. EXT: without deformities, mild arthritic changes; no varicose veins/ venous insuffic/ or edema. NEURO:  CN's intact; motor testing normal; no focal neuro deficits DERM:  No lesions noted; no rash etc...  RADIOLOGY DATA:  Reviewed in the EPIC EMR & discussed w/ the patient...  LABORATORY DATA:  Reviewed in the EPIC EMR & discussed w/ the patient...   Assessment & Plan:    OSA, UARS>  He uses CPAP nightly, continue same...  Chronic Cough>  He is quite dependent on the cough syrup, limited to one pint per month; doesn't want to change meds but insurance won't fill the Phenergan expectorant; change to Hycodan- 1 tsp Q8h prn...   MEDICAL PROBLEMS FOLLOWED by Dahlia Bailiff, DrFERARU >>   HBP>  Controlled on CCB & Diuretic now, off prev ACE and cough is improved...  DYSLIPIDEMIA>  On Crestor, Tricor + diet per DrPaz...   Obesity>  We reviewed diet & exercise but he has been noncompliant w/ both...  GI> GERD, IBS, Hems>  Stable on NexiumBid, etc...  GU> BPH, ED, Hypogonadism>  Followed by Lyn Hollingshead, DrKerr, & Urology...  DJD>  Followed by DrDean w/ prev left knee arthroscopy...  Vit D Defic>  On OTC Vit D supplementation & ok...  NEUROPATHY>  Followed by DrFeraru in [place] & intol to Neurontin/ Lyrica, etc; on VICODIN 2.5-500 Tid per Neurology...  ANXIETY>  he still sees Psychologist, occupational, for his Paxil & Klonopin...   Patient's Medications  New Prescriptions   No medications on file  Previous Medications   AMLODIPINE (NORVASC) 10 MG TABLET    Take 1 tablet (10 mg total) by mouth daily.   ASCORBIC ACID (VITAMIN C) 500 MG TABLET    Take 1,000 mg by mouth daily.    ASPIRIN 81 MG TABLET    Take 81 mg by mouth daily.  CALCIUM CARBONATE-VITAMIN D (RA CALCIUM PLUS VITAMIN D) 600-400 MG-UNIT PER TABLET    Take 1 tablet by  mouth daily.     CHOLECALCIFEROL (VITAMIN D) 2000 UNITS TABLET    Take 2,000 Units by mouth daily.     CLONAZEPAM (KLONOPIN) 1 MG TABLET    Take 1 mg by mouth 2 (two) times daily as needed.     COENZYME Q10 (CO Q 10 PO)    Take 1 tablet by mouth daily.    CYANOCOBALAMIN (B-12) 2000 MCG TABS    Take 1 tablet by mouth daily.   ESOMEPRAZOLE (NEXIUM) 40 MG CAPSULE    Take 1 capsule (40 mg total) by mouth 2 (two) times daily. 30 minutes before meals   FENOFIBRATE (TRICOR) 145 MG TABLET    Take 1 tablet (145 mg total) by mouth daily.   HYDROCHLOROTHIAZIDE (HYDRODIURIL) 25 MG TABLET    Take 0.5 tablets (12.5 mg total) by mouth daily.   HYDROCODONE-ACETAMINOPHEN (VICODIN) 2.5-500 MG PER TABLET    Take 1 tablet by mouth 2 (two) times daily as needed.    MULTIPLE VITAMINS-MINERALS (CENTRUM SILVER PO)    Take 1 tablet by mouth daily.     PAROXETINE (PAXIL-CR) 25 MG 24 HR TABLET    Take 25 mg by mouth every morning.     PYRIDOXINE HCL (VITAMIN B-6 CR) 200 MG TBCR    Take 1 tablet by mouth daily.     ROSUVASTATIN (CRESTOR) 20 MG TABLET    take 1 tablet by mouth every other day   TESTOSTERONE CYPIONATE (DEPOTESTOTERONE CYPIONATE) 200 MG/ML INJECTION    Inject into the muscle every 14 (fourteen) days.  Modified Medications   Modified Medication Previous Medication   HYDROCODONE-HOMATROPINE (HYCODAN) 5-1.5 MG/5ML SYRUP HYDROcodone-homatropine (HYCODAN) 5-1.5 MG/5ML syrup      Take 5 mLs by mouth every 6 (six) hours as needed for cough.    Take 5 mLs by mouth every 8 (eight) hours as needed for cough.  Discontinued Medications   CRESTOR 20 MG TABLET    take 1 tablet by mouth once daily

## 2015-01-31 LAB — FECAL OCCULT BLOOD, GUAIAC: Fecal Occult Blood: NEGATIVE

## 2015-02-28 ENCOUNTER — Other Ambulatory Visit: Payer: Self-pay | Admitting: Pulmonary Disease

## 2015-03-04 ENCOUNTER — Encounter: Payer: Self-pay | Admitting: Internal Medicine

## 2015-03-13 ENCOUNTER — Ambulatory Visit: Payer: Self-pay | Admitting: Internal Medicine

## 2015-03-17 ENCOUNTER — Ambulatory Visit (INDEPENDENT_AMBULATORY_CARE_PROVIDER_SITE_OTHER): Payer: Medicare Other | Admitting: Internal Medicine

## 2015-03-17 ENCOUNTER — Encounter: Payer: Self-pay | Admitting: Internal Medicine

## 2015-03-17 VITALS — BP 124/56 | HR 80 | Temp 97.9°F | Ht 76.0 in | Wt 279.0 lb

## 2015-03-17 DIAGNOSIS — E7800 Pure hypercholesterolemia, unspecified: Secondary | ICD-10-CM

## 2015-03-17 DIAGNOSIS — I10 Essential (primary) hypertension: Secondary | ICD-10-CM | POA: Diagnosis not present

## 2015-03-17 DIAGNOSIS — Z09 Encounter for follow-up examination after completed treatment for conditions other than malignant neoplasm: Secondary | ICD-10-CM | POA: Insufficient documentation

## 2015-03-17 DIAGNOSIS — Z9289 Personal history of other medical treatment: Secondary | ICD-10-CM

## 2015-03-17 LAB — BASIC METABOLIC PANEL
BUN: 15 mg/dL (ref 6–23)
CALCIUM: 9.8 mg/dL (ref 8.4–10.5)
CO2: 31 meq/L (ref 19–32)
CREATININE: 1.05 mg/dL (ref 0.40–1.50)
Chloride: 105 mEq/L (ref 96–112)
GFR: 74.54 mL/min (ref >60.00)
Glucose, Bld: 102 mg/dL — ABNORMAL HIGH (ref 70–99)
Potassium: 4.1 mEq/L (ref 3.5–5.1)
SODIUM: 140 meq/L (ref 135–145)

## 2015-03-17 LAB — HEPATITIS C ANTIBODY: HCV AB: NEGATIVE

## 2015-03-17 NOTE — Assessment & Plan Note (Signed)
HTN: Seems well-controlled, continue present care, check a BMP High cholesterol: Not at goal, LDL should be 70. We discussed possibly rechecking FLP and adjust his medication however he is intolerant to higher doses of Crestor (currently 1 by mouth every other day). Other medical problems: Managed by other doctors.  RTC 6 months

## 2015-03-17 NOTE — Progress Notes (Signed)
Subjective:    Patient ID: Ronald King, male    DOB: 08-19-1945, 69 y.o.   MRN: [id number]  DOS:  03/17/2015 Type of visit - description : Routine office visit Interval history: HTN: Good compliance of medication, ambulatory BPs are satisfactory. High cholesterol: Good compliance of medication    Review of Systems Denies chest pain. No nausea, vomiting, diarrhea  Past Medical History  Diagnosis Date  . Dyslipidemia   . Hypertension   . Esophageal polyp   . Anemia   . History of gastrointestinal hemorrhage   . OSA (obstructive sleep apnea)     on CPAP  . GERD (gastroesophageal reflux disease)     s/p [employer] F.  . Chronic bronchitis   . Chronic cough   . Hemorrhoids, internal   . DJD (degenerative joint disease)   . Vitamin D deficiency   . Neuropathy ([place])     per neuro-Dr Ferrarou vicodin  . Diabetes ([place]) 07-2011    per Dr Buddy Duty  . Hypogonadism, male     per Dr Buddy Duty  . Anxiety     Past Surgical History  Procedure Laterality Date  . Laparoscopic nissen fundoplication  [id number]    Dr. Hassell Done  . Knee arthroscopy  2007    Dr. Marlou Sa  . Tonsillectomy    . Nose surgery    . Anal fissure repair    . Hand fracture surgery      left    Social History   Social History  . Marital Status: Married    Spouse Name: N/A  . Number of Children: 1  . Years of Education: N/A   Occupational History  . Technical brewer, retired     retired  . 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 Smoking status: Former Smoker -- 1.00 packs/day for 3 years    Quit date: 03/29/1965  . Smokeless tobacco: Never Used  . Alcohol Use: No  . Drug Use: No  . Sexual Activity: Not on file   Other Topics Concern  . Not on file   Social History Narrative   Lives w/ wife        Medication List       This list is accurate as of: 03/17/15  6:02 PM.  Always use your most recent med list.               amLODipine 10 MG tablet    Commonly known as:  NORVASC  Take 1 tablet (10 mg total) by mouth daily.     ascorbic acid 500 MG tablet  Commonly known as:  VITAMIN C  Take 1,000 mg by mouth daily.     aspirin 81 MG tablet  Take 81 mg by mouth daily.     B-12 2000 MCG Tabs  Take 1 tablet by mouth daily.     CENTRUM SILVER PO  Take 1 tablet by mouth daily. Reported on 03/17/2015     clonazePAM 1 MG tablet  Commonly known as:  KLONOPIN  Take 1 mg by mouth 2 (two) times daily as needed.     CO Q 10 PO  Take 1 tablet by mouth daily.     CRESTOR 20 MG tablet  Generic drug:  rosuvastatin  take 1 tablet by mouth every other day     esomeprazole 40 MG capsule  Commonly known as:  NEXIUM  take 1 capsule by mouth twice a day 30  MINUTES BEFORE MEALS     fenofibrate 145 MG tablet  Commonly known as:  TRICOR  Take 1 tablet (145 mg total) by mouth daily.     hydrochlorothiazide 25 MG tablet  Commonly known as:  HYDRODIURIL  Take 0.5 tablets (12.5 mg total) by mouth daily.     HYDROcodone-acetaminophen 2.5-500 MG tablet  Commonly known as:  VICODIN  Take 0.5 tablets by mouth 2 (two) times daily as needed.     HYDROcodone-homatropine 5-1.5 MG/5ML syrup  Commonly known as:  HYCODAN  Take 5 mLs by mouth every 6 (six) hours as needed for cough.     PARoxetine 25 MG 24 hr tablet  Commonly known as:  PAXIL-CR  Take 25 mg by mouth every morning.     Pyridoxine HCl 200 MG Tbcr  Take 1 tablet by mouth daily.     RA CALCIUM PLUS VITAMIN D 600-400 MG-UNIT tablet  Generic drug:  Calcium Carbonate-Vitamin D  Take 1 tablet by mouth daily.     testosterone cypionate 200 MG/ML injection  Commonly known as:  DEPOTESTOSTERONE CYPIONATE  Inject into the muscle every 14 (fourteen) days.     Vitamin D 2000 UNITS tablet  Take 2,000 Units by mouth daily.           Objective:   Physical Exam BP 124/56 mmHg  Pulse 80  Temp(Src) 97.9 F (36.6 C) (Oral)  Ht 6\' 4"  (1.93 m)  Wt 279 lb (126.554 kg)  BMI 33.98  kg/m2  SpO2 96% General:   Well developed, well nourished . NAD.  HEENT:  Normocephalic . Face symmetric, atraumatic Lungs:  CTA B Normal respiratory effort, no intercostal retractions, no accessory muscle use. Heart: RRR,  no murmur.  No pretibial edema bilaterally  Skin: Not pale. Not jaundice Neurologic:  alert & oriented X3.  Speech normal, gait appropriate for age and unassisted Psych--  Cognition and judgment appear intact.  Cooperative with normal attention span and concentration.  Behavior appropriate. No anxious or depressed appearing.      Assessment & Plan:   Assessment > DM, Dr. Buddy Duty + Neuropathy , Dr Tonia Ghent, on Vicodin HTN Dyslipidemia Obesity, BMI 33 Anxiety -- paxil- clonazepam (per Dr Tye Savoy) Vitamin D deficiency OSA, CPAP Chronic cough-- Dr Lenna Gilford DJD, Dr. Marlou Sa Hypogonadism, Dr. Buddy Duty BPH ED GI: --GERD, status post Nissen fundoplication --Esophageal polyp -- h/o GI bleed- tranfussion (90s)  PLAN: HTN: Seems well-controlled, continue present care, check a BMP High cholesterol: Not at goal, LDL should be 70. We discussed possibly rechecking FLP and adjust his medication however he is intolerant to higher doses of Crestor (currently 1 by mouth every other day). Other medical problems: Managed by other doctors.  RTC 6 months

## 2015-03-17 NOTE — Patient Instructions (Signed)
Before you leave the office: Get your blood work at the lab  Get an appointment   Go to the front desk and schedule your complete physical exam in 6 months  Also an appointment to see Caryl Pina RN in 6 months for a Medicare wellness exam Please be fasting  We can also see you in the afternoon and check labs the next day

## 2015-03-17 NOTE — Progress Notes (Signed)
Pre visit review using our clinic review tool, if applicable. No additional management support is needed unless otherwise documented below in the visit note. 

## 2015-04-03 ENCOUNTER — Telehealth: Payer: Self-pay | Admitting: Pulmonary Disease

## 2015-04-03 MED ORDER — HYDROCODONE-HOMATROPINE 5-1.5 MG/5ML PO SYRP: 5.0000 mL | ORAL_SOLUTION | Freq: Four times a day (QID) | ORAL | Status: DC | PRN

## 2015-04-03 NOTE — Telephone Encounter (Signed)
Called spoke with pt. He is requesting refill on hycodan cough syrup. Last refilled 01/16/15 Sig: Take 5 mLs by mouth every 6 (six) hours as needed for cough. #480 ml x 0 refills Pt wants to pick up. Please advise SN thanks

## 2015-04-03 NOTE — Telephone Encounter (Signed)
Per SN >> okay to refill.  Rx has been printed and signed by SN. Pt is aware. Rx is up front for pick up. Nothing further was needed.

## 2015-05-26 ENCOUNTER — Ambulatory Visit (INDEPENDENT_AMBULATORY_CARE_PROVIDER_SITE_OTHER): Payer: Medicare Other | Admitting: Adult Health

## 2015-05-26 ENCOUNTER — Encounter: Payer: Self-pay | Admitting: Adult Health

## 2015-05-26 VITALS — BP 122/66 | HR 82 | Temp 98.3°F | Ht 74.0 in | Wt 269.0 lb

## 2015-05-26 DIAGNOSIS — G4733 Obstructive sleep apnea (adult) (pediatric): Secondary | ICD-10-CM | POA: Diagnosis not present

## 2015-05-26 DIAGNOSIS — R05 Cough: Secondary | ICD-10-CM

## 2015-05-26 DIAGNOSIS — R053 Chronic cough: Secondary | ICD-10-CM

## 2015-05-26 MED ORDER — HYDROCODONE-HOMATROPINE 5-1.5 MG/5ML PO SYRP: 5.0000 mL | ORAL_SOLUTION | Freq: Four times a day (QID) | ORAL | Status: DC | PRN

## 2015-05-26 NOTE — Patient Instructions (Signed)
Order sent to home care company for new CPAP machine  Use CPAP At bedtime  -goal is at least 4-6 hrs each night.  Work on weight loss.  Do not drive if sleepy.  Follow up Dr. Lenna Gilford  In 6 months and As needed

## 2015-05-26 NOTE — Addendum Note (Signed)
Addended by: Osa Craver on: 05/26/2015 11:36 AM   Modules accepted: Orders

## 2015-05-26 NOTE — Assessment & Plan Note (Signed)
OSA controlled on CPAP  Needs new machine as his is broken.   Plan  Order sent to home care company for new CPAP machine  Use CPAP At bedtime  -goal is at least 4-6 hrs each night.  Work on weight loss.  Do not drive if sleepy.  Follow up Dr. Lenna Gilford  In 6 months and As needed

## 2015-05-26 NOTE — Assessment & Plan Note (Signed)
Hycodan refilled.

## 2015-05-26 NOTE — Progress Notes (Signed)
Subjective:    Patient ID: Ronald King, male    DOB: 08/03/1945, 70 y.o.   MRN: [id number]  HPI  70 yo male followed by Dr. 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 Says he needs a new CPAP machine. Uses CPAP on average 6-8 hours nightly. 4 days ago, his stopped working. Has a loaner from [facility] but needs order for new machine.  Says he is on CPAP  16 cm H2O . Unable to get download as machine is broken.  Feels rested when he is on his machine. Does not feel as good on the loaner CPAP. Requests [employer] [facility] for new CPAP .  Denies chest pain, orthopnea, edema or fever.   Does have chronic cough , requests refill of hycodan. Has contract with Dr. Lenna Gilford  That only 1 rx a month it has been >6 weeks. No fever, or discolored mucus.      Past Medical History  Diagnosis Date  . Dyslipidemia   . Hypertension   . Esophageal polyp   . Anemia   . History of gastrointestinal hemorrhage   . OSA (obstructive sleep apnea)     on CPAP  . GERD (gastroesophageal reflux disease)     s/p Nissan F.  . Chronic bronchitis   . Chronic cough   . Hemorrhoids, internal   . DJD (degenerative joint disease)   . Vitamin D deficiency   . Neuropathy ([place])     per neuro-Dr Ferrarou vicodin  . Diabetes ([place]) 07-2011    per Dr Buddy Duty  . Hypogonadism, male     per Dr Buddy Duty  . Anxiety    .  Review of Systems   Constitutional:   No  weight loss, night sweats,  Fevers, chills, fatigue, or  lassitude.  HEENT:   No headaches,  Difficulty swallowing,  Tooth/dental problems, or  Sore throat,                No sneezing, itching, ear ache, nasal congestion, post nasal drip,   CV:  No chest pain,  Orthopnea, PND, swelling in lower extremities, anasarca, dizziness, palpitations, syncope.   GI  No heartburn, indigestion, abdominal pain, nausea, vomiting, diarrhea, change in bowel habits, loss of appetite,  bloody stools.   Resp:  No chest wall deformity  Skin: no rash or lesions.  GU: no dysuria, change in color of urine, no urgency or frequency.  No flank pain, no hematuria   MS:  No joint pain or swelling.  No decreased range of motion.  No back pain.  Psych:  No change in mood or affect. No depression or anxiety.  No memory loss.          Objective:   Physical Exam  Filed Vitals:   05/26/15 1043  BP: 122/66  Pulse: 82  Temp: 98.3 F (36.8 C)  TempSrc: Oral  Height: 6\' 2"  (1.88 m)  Weight: 269 lb (122.018 kg)  SpO2: 95%   Body mass index is 34.52 kg/(m^2).  GEN: A/Ox3; pleasant , NAD,obese   HEENT:  Day/AT,  EACs-clear, TMs-wnl, NOSE-clear, THROAT-clear, no lesions, no postnasal drip or exudate noted.   NECK:  Supple w/ fair ROM; no JVD; normal carotid impulses w/o bruits; no thyromegaly or nodules palpated; no lymphadenopathy.  RESP  Clear  P & A; w/o, wheezes/ rales/ or rhonchi.no accessory muscle use, no  dullness to percussion  CARD:  RRR, no m/r/g  , no peripheral edema, pulses intact, no cyanosis or clubbing.  GI:   Soft & nt; nml bowel sounds; no organomegaly or masses detected.  Musco: Warm bil, no deformities or joint swelling noted.   Neuro: alert, no focal deficits noted.    Skin: Warm, no lesions or rashes        Assessment & Plan:

## 2015-06-03 ENCOUNTER — Telehealth: Payer: Self-pay | Admitting: Internal Medicine

## 2015-06-03 MED ORDER — HYDROCHLOROTHIAZIDE 25 MG PO TABS: 12.5000 mg | ORAL_TABLET | Freq: Every day | ORAL | Status: DC

## 2015-06-03 MED ORDER — FENOFIBRATE 145 MG PO TABS: 145.0000 mg | ORAL_TABLET | Freq: Every day | ORAL | Status: DC

## 2015-06-03 NOTE — Telephone Encounter (Signed)
Relation to [id number] Call back number:[phone] Pharmacy: [facility], [place]  Reason for call:  Patient requesting a refill fenofibrate (TRICOR) 145 MG tablet and hydrochlorothiazide (HYDRODIURIL) 25 MG tablet

## 2015-06-03 NOTE — Telephone Encounter (Signed)
Rxs sent

## 2015-07-16 ENCOUNTER — Telehealth: Payer: Self-pay | Admitting: Pulmonary Disease

## 2015-07-16 MED ORDER — HYDROCODONE-HOMATROPINE 5-1.5 MG/5ML PO SYRP: 5.0000 mL | ORAL_SOLUTION | Freq: Four times a day (QID) | ORAL | Status: DC | PRN

## 2015-07-16 MED ORDER — AMOXICILLIN 500 MG PO TABS: 500.0000 mg | ORAL_TABLET | Freq: Three times a day (TID) | ORAL | Status: DC

## 2015-07-16 NOTE — Telephone Encounter (Signed)
Called and spoke to pt to advise that pt does not need to come in for appt with SN on 4.20.17 - as he is needing to f/u in August. Pt verbalized understanding and states he needs another Amoxicillin rx to keep on hand as he used his last one while in [place]. Pt states he used the Amox to help get over recent bronchitis and is now feeling better but still has a current cough and is requesting a refill on Hycodan refill. Spoke with SN and he advised: Ok to refill Hycodan 1 pint/month, 1 tsp PO q6h prn for severe cough and ok to fill Amoxicillin 500mg  #21, 1 po TID to keep on hand, refill x 2.   Called and spoke to pt. Informed him of the refills. Amox sent to preferred pharmacy and hycodan rx placed up front for pick up. Pt verbalized understanding and states he will call back to schedule August appt, 07/17/15 appt cancelled. Will sign off.

## 2015-07-17 ENCOUNTER — Ambulatory Visit: Payer: Medicare Other | Admitting: Pulmonary Disease

## 2015-07-28 LAB — HEMOGLOBIN A1C: HEMOGLOBIN A1C: 6.2

## 2015-08-07 ENCOUNTER — Encounter: Payer: Self-pay | Admitting: Pulmonary Disease

## 2015-08-07 ENCOUNTER — Ambulatory Visit (INDEPENDENT_AMBULATORY_CARE_PROVIDER_SITE_OTHER): Payer: Medicare Other | Admitting: Pulmonary Disease

## 2015-08-07 VITALS — BP 132/82 | HR 78 | Ht 74.0 in | Wt 275.6 lb

## 2015-08-07 DIAGNOSIS — R05 Cough: Secondary | ICD-10-CM

## 2015-08-07 DIAGNOSIS — G4733 Obstructive sleep apnea (adult) (pediatric): Secondary | ICD-10-CM

## 2015-08-07 DIAGNOSIS — R053 Chronic cough: Secondary | ICD-10-CM

## 2015-08-07 NOTE — Patient Instructions (Signed)
K[place],  Your CPAP appears to be working properly & your compliance is good... Keep up the good work...  Call for any questions...  Let's plan a follow up visit this fall in about 4-6 months.Marland KitchenMarland Kitchen

## 2015-08-07 NOTE — Progress Notes (Signed)
Subjective:    Patient ID: Ronald King, male    DOB: Dec 26, 1945, 70 y.o.   MRN: [id number]  HPI 70 y/o WM here for a follow up visit... he has a chronic cough x years and is dependent on codeine containing cough syrups... he was miserable when ENDAL HD went off the market and we had to substitute PHENERGAN EXPECTORANT w/ CODEINE - 1 tsp by mouth Q6H as needed for cough, limiting him to 1 pint per month; then his insurance company refused the Phenergan Expect & switched to [facility]... His new Primary Care doctor is DrPaz. ~  SEE PREV EPIC NOTES FOR OLDER DATA >>    PFT 11/2011 were essentially wnl w/ FVC=3.98 (76%), FEV1=3.46 (86%), %1sec=87, mid-flows are wnl at 112% predicted...   July 09, 2013:  36mo ROV and [facility] has established primary care w/ DrPaz> here today for Pulm follow up & refill of his cough syrup...  CXR 4/15 showed norm heart size, clear lungs, NAD.Marland Kitchen.  ~  July 11, 2014:  43mo ROV & Ronald King reports a stable interval- his CC is left hip pain & DrPaz has rec Ortho eval (currently pending); His breathing is OK, chr cough w/o change, occurs in paroxysms, and he denies recent bronchitic infections etc;  He notes some SOB/DOE w/ yard work, but exercises in the gym 3d/wk ([employer]) & he keeps up well... He is followed by DrPaz for Primary Care, DrKerr for Endocrine, DrFeraru for Neurology, and DrSanders for Psyche...     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    Chronic Cough> managed for yrs w/ cough syrup, now Hycodan 1pt per month- 1tsp Q6h prn cough; cough remains dry- he denies sput, hemoptysis, SOB, CP, etc...    Hx upper airway resistance syndrome> prev eval by DrClance- last seen 7/13- responded very well to CPAP despite a fairly neg Sleep Study in 1999; he continues on CPAP nightly & resting well, excellent daytime alertness, etc...    GERD> on Nexium40Bid +  vigorous antireflux regimen (elev HOB 6", npo after dinner, etc); we reviewed LPR problems; he had Nissen fundoplication in 99991111, last EGD was 2010 by DrDBrodie- no acute changes...    Obese> his weight is up 3# to 280 lbs now- BMI= 36 and we reviewed diet, exercise, wt reduction strategies...  We reviewed prob list, meds, xrays and labs> see below for updates >> meds refilled per request...   LABS 3/16 by DrPaz> FLP- at goals;  Chems- ok x BS=121, A1c=6.3;  CBC- wnl...  CXR 06/2104 showed borderline heart size, clear lungs, NAD- DJD in Tspine...  (I personally reviewed the images in Epic)   ~  January 16, 2015:  41mo ROV & Ronald King states "my lungs are acting up this month" c/o incr cough, chest congestion, but denies sput/ CP/ reflux or burning; he denies SOB & states his chr DOE is stable, no change; He wants his hydrocodone cough syrup refilled;  He notes that he was getting tired w/ DOE at [place] started on Testos shots w/ improvement & feeling good now he says...    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    Chronic Cough> managed for yrs w/ cough syrup, now Hycodan 1pt per month- 1tsp Q6h prn  cough; cough remains dry- he denies sput, hemoptysis, SOB, CP, etc...    Hx upper airway resistance syndrome> prev eval by DrClance- last seen 7/13- responded very well to CPAP despite a fairly neg Sleep Study in 1999; he continues on CPAP nightly & resting well, excellent daytime alertness, etc...    GERD> on Nexium40Bid + vigorous antireflux regimen (elev HOB 6", npo after dinner, etc); we reviewed LPR problems; he had Nissen fundoplication in [id number], last EGD was 2010 by DrDBrodie- no acute changes...    Obese> his weight is down 3# to 277 lbs now- BMI= 36 and we reviewed diet, exercise, wt reduction strategies...     Hyperlipidemia>  On Crestor20 and Tricor145 => note Feno145 was started in 2010 when  TG was >400 & Cres20 added by DrPaz in 2014; suggest that he stop the Fenofibrate at this time.    Low-T>  He is on Depo Testos 100mg  Q2wks per DrKerr, Endocrine (seen 11/2014 w/ Low-T, prediabetes, low bone mass & low VitD- note reviewed)    Dysthymia>  On Paxil-CR 25 and Klonopin 1mg  Bid... We reviewed prob list, meds, xrays and labs> see below for updates >> meds refilled per request, his PCP is DrPaz... IMP/PLAN>>  We discussed adding the MUCINEX 1200mg  bid + fluids;  We refilled his HYCODAN 1pt- 1tsp Q6H prn cough...  ~  Aug 07, 2015:  49mo ROV & Ronald King was seen by TP 05/26/15 for his OSA/upper airway resistance syndrome on CPAP16 x yrs w/ prev eval by DrClance (last seen 2013);  He needed a new CPAP machine & wanted the Dream machine=> this was arranged via [employer] & he is quite satisfied;  He has returned on this occais for a CPAP download to meet medicare criteria for on-going payment for his CPAP;  He is resting well, wakes refreshed in the AM 7 denies sleep pressure during the day, energy is satis, no daytime hypersomnolence, etc...  CPAP download required him to bring his machine into the office and it provides the following data for April-May2017> used 30/30 days, ave usage 8H/d, AHI=5.5 on his CPAP16, he did have 16% in leak & is working w/ a new mask at present...  EXAM shows Afeb, VSS, O2sat=94% on RA at rest; Wt=276#, 6'2"Tall, BMI=35;  HEENT- neg- mallampati3;  Chest- decr BS bilat, clear w/o w/r/r;  Heart- RR w/o m/r/g;  Abd- obese, soft, nontender;  Ext- neg w/o c/c/e;  Neuro- intact... IMP/PLAN>>  Ronald King is stable on his CPAP 16 & doing well w/ his new machine, good compliance, tolerated well; Rec to continue same...            Problem List:       Hx of SINUSITIS >> prev on NASONEX 2spBid + he requests Rx for AMOXICILLIN to keep on hand for travel... prev eval DrByers- "he wanted to do surg"... "my sinuses are crazy" but he refuses to consider the surg... also had dysphonia eval at [facility]  in the 90's w/ Botox tried.  OBSTRUCTIVE SLEEP APNEA, UPPER AIRWAY RESISTANCE SYNDROME>> he uses CPAP nightly... saw DrClance 12/09- he felt upper airway resistance syndrome... doing better w/ new CPAP machine in 2010. ~  7/13:  He had f/u DrClance & doing well, no changes made, continue same CPAP... ~  He remains stable on the CPAP, rests satis, no daytime sleepiness issues, etc...  COUGH, CHRONIC >> as above... he had an extensive eval in the past w/ GI, ENT, Pulm, etc... he used ENDAL-HD, then  PHEN  EXPECT w/ CODEINE, now HYCODAN 1tsp Q6h prn cough, lim to 1pt/mo (Med choice lim by his insurance). ~  Cough was no diff off ACE/ ARB in past & he does not want to change meds... ~  CXR 1/10 showed clear,  just min biapical pleural thickening... ~  2/11: states intol to Symbicort w/ HAs... try ADVAIR 250Bid... ~  CXR 2/11 showed clear lungs, NAD.Marland Kitchen. ~  CXR 9/12 showed chr bronchitic changes, NAD... ~  PFT 9/13 showed FVC=3.98 (76%), FEV1=3.46 (86%), %1sec=87, mid-flows=112% predicted...  ~  CXR 9/13 showed normal heart size, clear lungs, DJD spine... ~  4/15: his insurance company is requesting change from Phenergan expect w/ cod- we will write for Hycodan one tsp Q6h prn & continue to limit to 1pt/mo... ~  CXR 4/15 showed norm heart size, clear lungs, NAD...  ~  10/15: his insurance has again changed the cough syrup, this time from Phenergan expect w/ cod to [facility] same rx 1tsp Q6h prn cough, lim to 1pt/mo... 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    FOLLOWED FOR PRIMARY CARE BY DrPAZ >>   HYPERTENSION (ICD-401.9) - meds adjusted by DrCrenshaw on NORVASC 10mg /d & HCTZ 25mg - 1/2 tab daily...  ~  8/12:  BP= 134/82 & denies HA, fatigue, visual changes, CP, palipit, dizziness, syncope, dyspnea, edema, etc... ~  3/13:  BP= 124/76 & he denies CP, palpit, SOB, edema... ~  9/13:  BP= 110/66 & he remains asymptomatic... ~  He has established Primary Care f/u w/  DrPaz => on Amlod10, Hct12.5, & off prev Lisin; BP= 122/64 today.  DYSLIPIDEMIA (ICD-272.4) - on TRICOR 145mg /d but refuses Statin meds or Lipid Clinic referral... ~  FLP 1/10 showed TChol 161, TG 239, HDL 30, LDL 72... he wants to control w/ diet. ~  FLP 9/10 showed TChol 192, TG 426, HDL 30, LDL 80... rec> start [id number]. ~  [place] 2/11 showed TChol 173, TG 102, HDL 37, LDL 115... continue Tricor Rx + diet. ~  FLP 2/12 showed TChol 221, TG 249, HDL 31, LDL 140... Reminded to take med regularly & get wt down. ~  FLP 8/12 on Tric145 showed TChol 194, TG 169, HDL 32, LDL 128... Still not at goal, refuses statin med. ~  [place] 3/13 on Tric145 showed TChol 229, TG 177, HDL 35, LDL 140... rec to start CRESTOR20 + better low fat diet! ~  FLP 5/13 on Tric145+Cres20 showed TChol 151, TG 188, HDL 33, LDL 80... Continue same. ~  Lipids followed by DrPaz, now on Cres20 monotherapy => labs reviewed in EPIC...  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 discussed diet + exercise and the need for weight reduction both from his overall health status and his OSA... "I moderate what I eat". ~  weight 1/10 = 267# ~  weight 2/11 = 272#... discussed again! ~  Weight 2/12 = 276#... He is unconcerned! ~  Weight 8/12 = 274#... Reviewed diet & exercise needed. ~  Weight 3/13 = 274#... He is NOT trying. ~  Weight 9/13 = 272# ~  Weight 3/14 = 274# ~  Weight 10/14 = 280#  GERD (ICD-530.81) - on NEXIUM 40mg Bid & off the prev hs Reglan... s/p lap nissen 3/01 by DrMartin.... ~  EGD 11/05 showed prev Nissen, +gastritis... ~  EGD 6/10 by DrDBrodie showed  functioning Nissen, post-op changes, sessile polyp in distal esoph= benign mucosa.  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 ~  f/u colonoscopy 6/10 by DrDBrodie showed negative x for int hems...  HEMORRHOIDS  (ICD-455.6)  UROLOGY - eval by DrPeterson in 2004 for hypogonadism, ED, BPH...  Finally started ANDROGEL 5gm packet Qd in 2012 ~  labs 1/10 showed PSA= 1.63 ~  labs 2/11 showed PSA= 1.65,  ~  Labs 1/12 showed Testosterone level = 160-205 ~  Labs 2/12 showed PSA = 1.63, Testosterone level = 553 ~  Labs 8/12 showed Testosterone level = 428... rec continue daily Androgel. ~  Labs 3/13 showed PSA= 1.73, Testosterone level = 185 & states he is using the Androgel Q3rd day, rec to incr to daily. ~  Follow up labs & med adjustments per DrPaz => he referred pt to Endocrine, DrKerr... 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  VITAMIN D DEFICIENCY (ICD-268.9) - on Vit D OTC 2000 u daily... ~  labs 1/10 showed Vit D level = 16... Vit D 2000 u daily started. ~  labs 2/11 showed Vit D level = 34... continue supplement. ~  Labs 8/12 showed Vit D level = 47... continue same.  HEADACHE (ICD-784.0) - Chr daily migraines in the past w/ prev HA eval by DrFreeman (Botox shots helped in the past)...  NEUROPATHY (ICD-355.9) - c/o burning in legs and eval from Neuro in [facility] & Rx w/ Neurontin + Hydrocodone for pain... this may be part of a familial trait as several relatives have severe periph neuropathy (?hereditary syndrome)- we don't have notes from [facility] neurology. ~  Pt reports that he was intol to Neurontin, Lyrica, & one other ("memory gaps") & he refused Cymbalta trial... ~  3/13:  Reviewed w/ pt> he continues on VICODIN 2.5-500 from DrGibson up to Tid, 90/mo w/ refills from him... ~  Now followed by [facility], DrFeraru on Vits and Hydrocodone 2.5mg  Bid as needed...   ANXIETY (ICD-300.00) - on PAXIL CR 25mg /d & KLONOPIN 1mg  Bid as needed... they were prev perscribed by ?psychiatrist? but he notes "I've been  on these for >6yrs"... he still sees DrSanders for these prescriptions...   Past Surgical History  Procedure Laterality Date  . Laparoscopic nissen fundoplication  [id number]    Dr. Hassell Done  . Knee arthroscopy  2007    Dr. Marlou Sa  . Tonsillectomy    . Nose surgery    . Anal fissure repair    . Hand fracture surgery      left    Outpatient Encounter Prescriptions as of 08/07/2015  Medication Sig  . amLODipine (NORVASC) 10 MG tablet Take 1 tablet (10 mg total) by mouth daily.  Marland Kitchen amoxicillin (AMOXIL) 500 MG tablet Take 1 tablet (500 mg total) by mouth 3 (three) times daily.  Marland Kitchen ascorbic acid (VITAMIN C) 500 MG tablet Take 1,000 mg by mouth daily.   Marland Kitchen aspirin 81 MG tablet Take 81 mg by mouth daily.    . Calcium Carbonate-Vitamin D (RA CALCIUM PLUS VITAMIN D) 600-400 MG-UNIT per tablet Take 1 tablet by mouth daily.    . Cholecalciferol (VITAMIN D) 2000 UNITS tablet Take 2,000 Units by mouth daily.    . clonazePAM (KLONOPIN) 1 MG tablet Take 1 mg by mouth 2 (two)  times daily as needed.    . Coenzyme Q10 (CO Q 10 PO) Take 1 tablet by mouth daily.   . Cyanocobalamin (B-12) 2000 MCG TABS Take 1 tablet by mouth daily.  Marland Kitchen esomeprazole (NEXIUM) 40 MG capsule take 1 capsule by mouth twice a day 30 MINUTES BEFORE MEALS  . fenofibrate (TRICOR) 145 MG tablet Take 1 tablet (145 mg total) by mouth daily.  . hydrochlorothiazide (HYDRODIURIL) 25 MG tablet Take 0.5 tablets (12.5 mg total) by mouth daily.  Marland Kitchen HYDROcodone-acetaminophen (VICODIN) 2.5-500 MG per tablet Take 0.5 tablets by mouth 2 (two) times daily as needed.   Marland Kitchen HYDROcodone-homatropine (HYCODAN) 5-1.5 MG/5ML syrup Take 5 mLs by mouth every 6 (six) hours as needed for cough.  . Multiple Vitamins-Minerals (CENTRUM SILVER PO) Take 1 tablet by mouth daily. Reported on 03/17/2015  . PARoxetine (PAXIL-CR) 25 MG 24 hr tablet Take 25 mg by mouth every morning.    . Pyridoxine HCl (VITAMIN B-6 CR) 200 MG TBCR Take 1 tablet by mouth daily.    . rosuvastatin  (CRESTOR) 20 MG tablet take 1 tablet by mouth every other day  . testosterone cypionate (DEPOTESTOTERONE CYPIONATE) 200 MG/ML injection Inject into the muscle every 14 (fourteen) days.   No facility-administered encounter medications on file as of 08/07/2015.    Allergies  Allergen Reactions  . Cefuroxime Axetil     REACTION: unsure of reaction  . Montelukast Sodium     REACTION: unsure of reaction    Current Medications, Allergies, Past Medical History, Past Surgical History, Family History, and Social History were reviewed in [employer] record.   Review of Systems        See HPI - all other systems neg except as noted...  The patient complains of cough, & dyspnea on exertion.  The patient denies anorexia, fever, weight loss, weight gain, vision loss, decreased hearing, hoarseness, chest pain, syncope, peripheral edema, headaches, hemoptysis, abdominal pain, melena, hematochezia, severe indigestion/heartburn, hematuria, incontinence, muscle weakness, suspicious skin lesions, transient blindness, difficulty walking, depression, unusual weight change, abnormal bleeding, enlarged lymph nodes, and angioedema.     Objective:   Physical Exam      WD, Obese, 70 y/o WM in NAD... GENERAL:  Alert & oriented; pleasant & cooperative... HEENT:  [place]/AT, EOM-wnl, PERRLA, EACs-clear, TMs-wnl, NOSE-clear, THROAT-clear & wnl. NECK:  Supple w/ fairROM; no JVD; normal carotid impulses w/o bruits; no thyromegaly or nodules palpated; no lymphadenopathy. CHEST:  Clear to P & A; without wheezes/ rales/ or rhonchi; he has an intermittent dry irritative cough... HEART:  Regular Rhythm; without murmurs/ rubs/ or gallops. ABDOMEN:  Obese, soft & nontender; normal bowel sounds; no organomegaly or masses detected. EXT: without deformities, mild arthritic changes; no varicose veins/ venous insuffic/ or edema. NEURO:  CN's intact; motor testing normal; no focal neuro deficits DERM:  No  lesions noted; no rash etc...  RADIOLOGY DATA:  Reviewed in the EPIC EMR & discussed w/ the patient...  LABORATORY DATA:  Reviewed in the EPIC EMR & discussed w/ the patient...   Assessment & Plan:    OSA, UARS>  He uses CPAP nightly, continue same...  Chronic Cough>  He is quite dependent on the cough syrup, limited to one pint per month; doesn't want to change meds but insurance won't fill the Phenergan expectorant; change to Hycodan- 1 tsp Q8h prn...   MEDICAL PROBLEMS FOLLOWED by Dahlia Bailiff, DrFERARU >>   HBP>  Controlled on CCB & Diuretic now, off prev ACE and cough  is improved...  DYSLIPIDEMIA>  On Crestor, Tricor + diet per DrPaz...   Obesity>  We reviewed diet & exercise but he has been noncompliant w/ both...  GI> GERD, IBS, Hems>  Stable on NexiumBid, etc...  GU> BPH, ED, Hypogonadism>  Followed by Lyn Hollingshead, DrKerr, & Urology...  DJD>  Followed by DrDean w/ prev left knee arthroscopy...  Vit D Defic>  On OTC Vit D supplementation & ok...  NEUROPATHY>  Followed by DrFeraru in [facility] & intol to Neurontin/ Lyrica, etc; on VICODIN 2.5-500 Tid per Neurology...  ANXIETY>  he still sees Psychologist, occupational, for his Paxil & Klonopin...   Patient's Medications  New Prescriptions   No medications on file  Previous Medications   AMLODIPINE (NORVASC) 10 MG TABLET    Take 1 tablet (10 mg total) by mouth daily.   AMOXICILLIN (AMOXIL) 500 MG TABLET    Take 1 tablet (500 mg total) by mouth 3 (three) times daily.   ASCORBIC ACID (VITAMIN C) 500 MG TABLET    Take 1,000 mg by mouth daily.    ASPIRIN 81 MG TABLET    Take 81 mg by mouth daily.     CALCIUM CARBONATE-VITAMIN D (RA CALCIUM PLUS VITAMIN D) 600-400 MG-UNIT PER TABLET    Take 1 tablet by mouth daily.     CHOLECALCIFEROL (VITAMIN D) 2000 UNITS TABLET    Take 2,000 Units by mouth daily.     CLONAZEPAM (KLONOPIN) 1 MG TABLET    Take 1 mg by mouth 2 (two) times daily as needed.     COENZYME Q10 (CO Q 10 PO)    Take 1 tablet  by mouth daily.    CYANOCOBALAMIN (B-12) 2000 MCG TABS    Take 1 tablet by mouth daily.   ESOMEPRAZOLE (NEXIUM) 40 MG CAPSULE    take 1 capsule by mouth twice a day 30 MINUTES BEFORE MEALS   FENOFIBRATE (TRICOR) 145 MG TABLET    Take 1 tablet (145 mg total) by mouth daily.   HYDROCHLOROTHIAZIDE (HYDRODIURIL) 25 MG TABLET    Take 0.5 tablets (12.5 mg total) by mouth daily.   HYDROCODONE-ACETAMINOPHEN (VICODIN) 2.5-500 MG PER TABLET    Take 0.5 tablets by mouth 2 (two) times daily as needed.    HYDROCODONE-HOMATROPINE (HYCODAN) 5-1.5 MG/5ML SYRUP    Take 5 mLs by mouth every 6 (six) hours as needed for cough.   MULTIPLE VITAMINS-MINERALS (CENTRUM SILVER PO)    Take 1 tablet by mouth daily. Reported on 03/17/2015   PAROXETINE (PAXIL-CR) 25 MG 24 HR TABLET    Take 25 mg by mouth every morning.     PYRIDOXINE HCL (VITAMIN B-6 CR) 200 MG TBCR    Take 1 tablet by mouth daily.     ROSUVASTATIN (CRESTOR) 20 MG TABLET    take 1 tablet by mouth every other day   TESTOSTERONE CYPIONATE (DEPOTESTOTERONE CYPIONATE) 200 MG/ML INJECTION    Inject into the muscle every 14 (fourteen) days.  Modified Medications   No medications on file  Discontinued Medications   No medications on file

## 2015-08-13 ENCOUNTER — Encounter: Payer: Self-pay | Admitting: Pulmonary Disease

## 2015-08-28 ENCOUNTER — Encounter: Payer: Self-pay | Admitting: Internal Medicine

## 2015-09-23 ENCOUNTER — Encounter: Payer: Self-pay | Admitting: Internal Medicine

## 2015-09-24 ENCOUNTER — Ambulatory Visit (INDEPENDENT_AMBULATORY_CARE_PROVIDER_SITE_OTHER): Payer: Medicare Other | Admitting: Internal Medicine

## 2015-09-24 ENCOUNTER — Ambulatory Visit: Payer: Medicare Other

## 2015-09-24 ENCOUNTER — Encounter: Payer: Self-pay | Admitting: Internal Medicine

## 2015-09-24 VITALS — BP 108/68 | HR 64 | Temp 98.0°F | Ht 74.0 in | Wt 277.5 lb

## 2015-09-24 DIAGNOSIS — Z23 Encounter for immunization: Secondary | ICD-10-CM

## 2015-09-24 DIAGNOSIS — Z0000 Encounter for general adult medical examination without abnormal findings: Secondary | ICD-10-CM

## 2015-09-24 NOTE — Progress Notes (Signed)
Pre visit review using our clinic review tool, if applicable. No additional management support is needed unless otherwise documented below in the visit note. 

## 2015-09-24 NOTE — Assessment & Plan Note (Signed)
05-2015: Had labs and endocrinology: CMP, CBC, vitamin D, A1c, PSA. Will get records HTN: Continue amlodipine High cholesterol: Continue Crestor, get labs done few months ago. Vitamin D deficiency: Get labs done few months ago RTC 8 months

## 2015-09-24 NOTE — Progress Notes (Signed)
Subjective:    Patient ID: Ronald King, male    DOB: 1946-01-15, 70 y.o.   MRN: [id number]  DOS:  09/24/2015 Type of visit - description : CPX Interval history: Here for a CPX, no major concerns, good med compliance. Office note from endocrinology reviewed.    Review of Systems Constitutional: No fever. No chills. No unexplained wt changes. No unusual sweats  HEENT: No dental problems, no ear discharge, no facial swelling, no voice changes. No eye discharge, no eye  redness , no  intolerance to light   Respiratory: No wheezing , no  difficulty breathing. Chronic cough at baseline    Cardiovascular: No CP, no leg swelling , no  Palpitations  GI: no nausea, no vomiting, no diarrhea , no  abdominal pain.  No blood in the stools. No dysphagia, no odynophagia    Endocrine: No polyphagia, no polyuria , no polydipsia  GU: No dysuria, gross hematuria, difficulty urinating. No urinary urgency, no frequency.  Musculoskeletal: No joint swellings or unusual aches or pains  Skin: No change in the color of the skin, palor , no  Rash  Allergic, immunologic: No environmental allergies , no  food allergies  Neurological: No dizziness no  syncope. No headaches. No diplopia, no slurred, no slurred speech, no motor deficits, no facial  Numbness  Hematological: No enlarged lymph nodes, no easy bruising , no unusual bleedings  Psychiatry: No suicidal ideas, no hallucinations, no beavior problems, no confusion.  No unusual/severe anxiety, no depression   Past Medical History  Diagnosis Date  . Dyslipidemia   . Hypertension   . Esophageal polyp   . Anemia   . History of gastrointestinal hemorrhage   . OSA (obstructive sleep apnea)     on CPAP  . GERD (gastroesophageal reflux disease)     s/p Nissan F.  . Chronic bronchitis   . Chronic cough   . Hemorrhoids, internal   . DJD (degenerative joint disease)   . Vitamin D deficiency   . Neuropathy ([place])     per neuro-Dr  Ferrarou vicodin  . Diabetes ([place]) 07-2011    per Dr Buddy Duty  . Hypogonadism, male     per Dr Buddy Duty  . Anxiety     Past Surgical History  Procedure Laterality Date  . Laparoscopic nissen fundoplication  [id number]    Dr. Hassell Done  . Knee arthroscopy  2007    Dr. Marlou Sa  . Tonsillectomy    . Nose surgery    . Anal fissure repair    . Hand fracture surgery      left    Social History   Social History  . Marital Status: Married    Spouse Name: N/A  . Number of Children: 1  . Years of Education: N/A   Occupational History  . Technical brewer, retired     retired  . 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 Smoking status: Former Smoker -- 1.00 packs/day for 3 years    Quit date: 03/29/1965  . Smokeless tobacco: Never Used  . Alcohol Use: No  . Drug Use: No  . Sexual Activity: Not on file   Other Topics Concern  . Not on file   Social History Narrative   Lives w/ wife     Family History  Problem Relation Age of Onset  . Cerebral aneurysm Father   . Alcohol abuse Father   . Neuropathy Mother   .  Migraines Brother     several  . Diabetes Maternal Uncle   . Heart disease Mother     died at age 69  . Colon cancer Neg Hx   . Prostate cancer Neg Hx   . CAD Brother     CABG in his 74s       Medication List       This list is accurate as of: 09/24/15  5:39 PM.  Always use your most recent med list.               amLODipine 10 MG tablet  Commonly known as:  NORVASC  Take 1 tablet (10 mg total) by mouth daily.     amoxicillin 500 MG tablet  Commonly known as:  AMOXIL  Take 1 tablet (500 mg total) by mouth 3 (three) times daily.     ascorbic acid 500 MG tablet  Commonly known as:  VITAMIN C  Take 1,000 mg by mouth daily.     aspirin 81 MG tablet  Take 81 mg by mouth daily.     B-12 2000 MCG Tabs  Take 1 tablet by mouth daily.     CENTRUM SILVER PO  Take 1 tablet by mouth daily. Reported on 03/17/2015     clonazePAM  1 MG tablet  Commonly known as:  KLONOPIN  Take 1 mg by mouth 2 (two) times daily as needed.     CO Q 10 PO  Take 1 tablet by mouth daily.     CRESTOR 20 MG tablet  Generic drug:  rosuvastatin  take 1 tablet by mouth every other day     esomeprazole 40 MG capsule  Commonly known as:  NEXIUM  take 1 capsule by mouth twice a day 30 MINUTES BEFORE MEALS     fenofibrate 145 MG tablet  Commonly known as:  TRICOR  Take 1 tablet (145 mg total) by mouth daily.     hydrochlorothiazide 25 MG tablet  Commonly known as:  HYDRODIURIL  Take 0.5 tablets (12.5 mg total) by mouth daily.     HYDROcodone-acetaminophen 2.5-500 MG tablet  Commonly known as:  VICODIN  Take 0.5 tablets by mouth 2 (two) times daily as needed.     HYDROcodone-homatropine 5-1.5 MG/5ML syrup  Commonly known as:  HYCODAN  Take 5 mLs by mouth every 6 (six) hours as needed for cough.     PARoxetine 20 MG tablet  Commonly known as:  PAXIL  Take 20 mg by mouth daily.     Pyridoxine HCl 200 MG Tbcr  Take 1 tablet by mouth daily.     RA CALCIUM PLUS VITAMIN D 600-400 MG-UNIT tablet  Generic drug:  Calcium Carbonate-Vitamin D  Take 1 tablet by mouth daily.     testosterone cypionate 200 MG/ML injection  Commonly known as:  DEPOTESTOSTERONE CYPIONATE  Inject into the muscle every 14 (fourteen) days.     Vitamin D 2000 units tablet  Take 2,000 Units by mouth daily.           Objective:   Physical Exam BP 108/68 mmHg  Pulse 64  Temp(Src) 98 F (36.7 C) (Oral)  Ht 6\' 2"  (1.88 m)  Wt 277 lb 8 oz (125.873 kg)  BMI 35.61 kg/m2  SpO2 94%  General:   Well developed, well nourished . NAD.  Neck: No  thyromegaly  HEENT:  Normocephalic . Face symmetric, atraumatic Lungs:  CTA B Normal respiratory effort, no intercostal retractions, no accessory muscle use. Heart: RRR,  no murmur.  No pretibial edema bilaterally  Abdomen:  Not distended, soft, non-tender. No rebound or rigidity.   Skin: Exposed areas  without rash. Not pale. Not jaundice Neurologic:  alert & oriented X3.  Speech normal, gait appropriate for age and unassisted Strength symmetric and appropriate for age.  Psych: Cognition and judgment appear intact.  Cooperative with normal attention span and concentration.  Behavior appropriate. No anxious or depressed appearing.    Assessment & Plan:   Assessment > DM, Dr. Buddy Duty HTN dislipidemia Hypogonadism, Dr. Buddy Duty + Neuropathy , Dr Tonia Ghent, on Vicodin  Obesity, BMI 33 Anxiety -- paxil- clonazepam (per Dr Tye Savoy) Vitamin D deficiency PULM:  --OSA, CPAP --Chronic cough-- Dr Lenna Gilford DJD, Dr. Marlou Sa GU:  BPH ED GI: --GERD, status post Nissen fundoplication --Esophageal polyp -- h/o GI bleed- tranfussion (90s)  PLAN: 05-2015: Had labs and endocrinology: CMP, CBC, vitamin D, A1c, PSA. Will get records HTN: Continue amlodipine High cholesterol: Continue Crestor, get labs done few months ago. Vitamin D deficiency: Get labs done few months ago RTC 8 months

## 2015-09-24 NOTE — Progress Notes (Addendum)
Subjective:   Ronald King is a 70 y.o. male who presents for Medicare Annual/Subsequent preventive examination.  Review of Systems:  ROS. No ROS. Medicare Wellness Visit.  Home Safety/Smoke Alarms: Lives in a 1 story home, 1 set of stairs going to bonus room. Feels safe in home, smoke alarms in home.  Firearm Safety: Stored in a locked cabinet. Seat Belt Safety/Bike Helmet: Always wears seat belt.   Dental-Sees dentist every 6 months to 1 year, cannot remember name of provider [facility] in [place] every 6 months-1 year Hearing-Able to hear conversational tones w/o difficulty. Passes whisper test. Reports he has been diagnosed w/ some hearing loss in the past.  Male:   CCS: Last done 08/30/2008, internal hemorrhoids, otherwise normal PSA: Last done approx April 2017 by Dr. Buddy Duty, will request results from his office    Cardiac Risk Factors include: advanced age (>22men, >78 women);dyslipidemia;obesity (BMI >30kg/m2);male gender;hypertension     Objective:    Vitals: BP 108/68 mmHg  Pulse 64  Temp(Src) 98 F (36.7 C) (Oral)  Ht 6\' 2"  (1.88 m)  Wt 277 lb 8 oz (125.873 kg)  BMI 35.61 kg/m2  SpO2 94%  Body mass index is 35.61 kg/(m^2).  Tobacco History  Smoking status  . Former Smoker -- 1.00 packs/day for 3 years  . Quit date: 03/29/1965  Smokeless tobacco  . Never Used     Counseling given: No   Past Medical History  Diagnosis Date  . Dyslipidemia   . Hypertension   . Esophageal polyp   . Anemia   . History of gastrointestinal hemorrhage   . OSA (obstructive sleep apnea)     on CPAP  . GERD (gastroesophageal reflux disease)     s/p Nissan F.  . Chronic bronchitis   . Chronic cough   . Hemorrhoids, internal   . DJD (degenerative joint disease)   . Vitamin D deficiency   . Neuropathy ([place])     per neuro-Dr Ferrarou vicodin  . Diabetes ([place]) 07-2011    per Dr Buddy Duty  . Hypogonadism, male     per Dr Buddy Duty  . Anxiety    Past Surgical  History  Procedure Laterality Date  . Laparoscopic nissen fundoplication  [id number]    Dr. Hassell Done  . Knee arthroscopy  2007    Dr. Marlou Sa  . Tonsillectomy    . Nose surgery    . Anal fissure repair    . Hand fracture surgery      left   Family History  Problem Relation Age of Onset  . Cerebral aneurysm Father   . Alcohol abuse Father   . Neuropathy Mother   . Migraines Brother     several  . Diabetes Maternal Uncle   . Heart disease Mother     died at age 42  . Colon cancer Neg Hx   . Prostate cancer Neg Hx   . CAD Brother     CABG in his 41s   History  Sexual Activity  . Sexual Activity: Not on file    Outpatient Encounter Prescriptions as of 09/24/2015  Medication Sig  . amLODipine (NORVASC) 10 MG tablet Take 1 tablet (10 mg total) by mouth daily.  Marland Kitchen ascorbic acid (VITAMIN C) 500 MG tablet Take 1,000 mg by mouth daily.   Marland Kitchen aspirin 81 MG tablet Take 81 mg by mouth daily.    . Calcium Carbonate-Vitamin D (RA CALCIUM PLUS VITAMIN D) 600-400 MG-UNIT per tablet Take 1 tablet  by mouth daily.    . Cholecalciferol (VITAMIN D) 2000 UNITS tablet Take 2,000 Units by mouth daily.    . clonazePAM (KLONOPIN) 1 MG tablet Take 1 mg by mouth 2 (two) times daily as needed.    . Coenzyme Q10 (CO Q 10 PO) Take 1 tablet by mouth daily.   . Cyanocobalamin (B-12) 2000 MCG TABS Take 1 tablet by mouth daily.  Marland Kitchen esomeprazole (NEXIUM) 40 MG capsule take 1 capsule by mouth twice a day 30 MINUTES BEFORE MEALS  . fenofibrate (TRICOR) 145 MG tablet Take 1 tablet (145 mg total) by mouth daily.  . hydrochlorothiazide (HYDRODIURIL) 25 MG tablet Take 0.5 tablets (12.5 mg total) by mouth daily.  Marland Kitchen HYDROcodone-acetaminophen (VICODIN) 2.5-500 MG per tablet Take 0.5 tablets by mouth 2 (two) times daily as needed.   . Multiple Vitamins-Minerals (CENTRUM SILVER PO) Take 1 tablet by mouth daily. Reported on 03/17/2015  . PARoxetine (PAXIL) 20 MG tablet Take 20 mg by mouth daily.  . Pyridoxine HCl (VITAMIN B-6 CR)  200 MG TBCR Take 1 tablet by mouth daily.    . rosuvastatin (CRESTOR) 20 MG tablet take 1 tablet by mouth every other day  . testosterone cypionate (DEPOTESTOTERONE CYPIONATE) 200 MG/ML injection Inject into the muscle every 14 (fourteen) days.  . [DISCONTINUED] PARoxetine (PAXIL-CR) 25 MG 24 hr tablet Take 20 mg by mouth every morning.   Marland Kitchen amoxicillin (AMOXIL) 500 MG tablet Take 1 tablet (500 mg total) by mouth 3 (three) times daily. (Patient not taking: Reported on 09/24/2015)  . HYDROcodone-homatropine (HYCODAN) 5-1.5 MG/5ML syrup Take 5 mLs by mouth every 6 (six) hours as needed for cough. (Patient not taking: Reported on 09/24/2015)   No facility-administered encounter medications on file as of 09/24/2015.    Activities of Daily Living In your present state of health, do you have any difficulty performing the following activities: 09/24/2015  Hearing? N  Vision? N  Difficulty concentrating or making decisions? N  Walking or climbing stairs? N  Dressing or bathing? N  Doing errands, shopping? N  Preparing Food and eating ? N  Using the Toilet? N  In the past six months, have you accidently leaked urine? Y  Do you have problems with loss of bowel control? N  Managing your Medications? N  Managing your Finances? N  Housekeeping or managing your Housekeeping? N    Patient Care Team: Colon Branch, MD as PCP - General (Internal Medicine) Delrae Rend, MD as Consulting Physician (Endocrinology) Noralee Space, MD as Consulting Physician (Pulmonary Disease) Johny Shock. Doy Hutching, MD as Consulting Physician (Neurology)   Assessment:   Physical exam deferred to PCP.  Exercise Activities and Dietary recommendations  Current Exercise Habits: The patient does not participate in regular exercise at present, Type of exercise: walking;Other - see comments (Pt works outside frequently and tries to stay as active as possible.), Intensity: Not Applicable, Exercise limited by: Other - see comments (Used to  go to gym MWF for exercise class, going less frequently now d/t mother-in-law being sick w/ Hospice care. She has recnetly moved in w/ pt and his wife.)   Diet Breakfast: Cereal and bananas w/ 1% milk Lunch: Varies, chicken and vegetables. Will eat a lighter lunch if having a heavier dinner or vice versa. Dinner: Meat, goes to [employer] and [employer], grills steak and hamburgers and salmon, vegetables  Goals    . Increase physical activity      Fall Risk Fall Risk  09/24/2015 09/12/2014 07/09/2013 06/01/2012  Falls in the past year? Yes No No No  Number falls in past yr: 1 - - -  Injury with Fall? No - - -   Depression Screen PHQ 2/9 Scores 09/24/2015 09/12/2014 07/09/2013 06/01/2012  PHQ - 2 Score 0 0 0 0    Cognitive Testing MMSE - Mini Mental State Exam 09/24/2015  Orientation to time 5  Orientation to Place 5  Registration 3  Attention/ Calculation 5  Recall 3  Language- name 2 objects 2  Language- repeat 1  Language- follow 3 step command 3  Language- read & follow direction 1  Write a sentence 1  Copy design 1  Total score 30    Immunization History  Administered Date(s) Administered  . Influenza Split 01/28/2011, 12/16/2011  . Influenza Whole 12/25/2008  . Influenza, High Dose Seasonal PF 01/09/2015  . Influenza,inj,Quad PF,36+ Mos 01/10/2014  . Influenza-Unspecified 11/27/2012  . Pneumococcal Conjugate-13 09/06/2013  . Pneumococcal Polysaccharide-23 12/16/2011  . Td 09/24/2015  . Tdap 03/29/2005  . Zoster 12/15/2012   Screening Tests Health Maintenance  Topic Date Due  . OPHTHALMOLOGY EXAM  09/13/1955  . URINE MICROALBUMIN  09[birth year]13/1956  . FOOT EXAM  05/30/2015  . INFLUENZA VACCINE  10/28/2015  . HEMOGLOBIN A1C  01/28/2016  . COLONOSCOPY  08/31/2018  . TETANUS/TDAP  09/23/2025  . ZOSTAVAX  Completed  . Hepatitis C Screening  Completed  . PNA vac Low Risk Adult  Completed      Plan:   Continue to eat heart healthy diet (full of fruits, vegetables, whole grains,  lean protein, water--limit salt, fat, and sugar intake) and increase physical activity as tolerated.  Continue doing brain stimulating activities (puzzles, reading, adult coloring books, staying active) to keep memory sharp.   During the course of the visit the patient was educated and counseled about the following appropriate screening and preventive services:   Vaccines to include Pneumoccal, Influenza, Hepatitis B, Td, Zostavax, HCV  Cardiovascular Disease  Colorectal cancer screening  Diabetes screening  Prostate Cancer Screening  Nutrition counseling    Dorrene German, RN  09/24/2015  Agree  French Ana MD

## 2015-09-24 NOTE — Patient Instructions (Signed)
  GO TO THE FRONT DESK Schedule your next appointment for a  routine checkup in 8 months.     Check the  blood pressure  weekly Be sure your blood pressure is between 110/65 and  145/85. If it is consistently higher or lower, let me know

## 2015-09-24 NOTE — Assessment & Plan Note (Addendum)
Td 2007  ; Pneumonia shot 2013; prevnar 2015 zostavax-- 11-2012  Cscope 08-2008, neg, next in 10 years, was rec hemocult q 2-3 years, Neg hemocult  09-2013 , 01-2015, ifob provided today PSA done at endocrinology 05-2015, will get records. rec to stay active and eat healthy

## 2015-10-10 ENCOUNTER — Other Ambulatory Visit: Payer: Self-pay

## 2015-10-10 ENCOUNTER — Telehealth: Payer: Self-pay | Admitting: Pulmonary Disease

## 2015-10-10 MED ORDER — ROSUVASTATIN CALCIUM 20 MG PO TABS: 20.0000 mg | ORAL_TABLET | ORAL | Status: DC

## 2015-10-10 MED ORDER — AMLODIPINE BESYLATE 10 MG PO TABS: 10.0000 mg | ORAL_TABLET | Freq: Every day | ORAL | Status: DC

## 2015-10-10 NOTE — Telephone Encounter (Signed)
Called spoke with pt. He states that he needs a refill on his Norvasc sent to the [facility] on [address]. He states he needs it today or he will run out of medication by 10/13/15. I explained to him that I would send it today. He voiced understanding and had no further questions. Rx sent. Nothing further needed.

## 2015-10-20 ENCOUNTER — Telehealth: Payer: Self-pay | Admitting: Pulmonary Disease

## 2015-10-20 MED ORDER — AZITHROMYCIN 250 MG PO TABS: ORAL_TABLET | ORAL | 0 refills | Status: DC

## 2015-10-20 MED ORDER — HYDROCODONE-HOMATROPINE 5-1.5 MG/5ML PO SYRP
5.0000 mL | ORAL_SOLUTION | Freq: Four times a day (QID) | ORAL | 0 refills | Status: DC | PRN
Start: 2015-10-20 — End: 2015-12-11

## 2015-10-20 NOTE — Telephone Encounter (Signed)
[phone] pt calling back

## 2015-10-20 NOTE — Telephone Encounter (Signed)
Per SN: okay for zpak #1 take as directed and Hycodan syrup #8oz 1tsp every 6 hours as needed.  Thanks.  Called spoke with patient and advised of SN's recs as stated above.  Pt okay with the recommendations provided and requested both the zpak and hycodan be printed (offered to send the zpak electronically but pt declined).  Pt stated he will come by tomorrow morning to pick up Rx.  Rx's printed for SN to sign w/ envelope Will go ahead and sign off on note

## 2015-10-20 NOTE — Telephone Encounter (Signed)
Called spoke with pt. He c/o sinus congestion/drainage, prod cough with white colored mucus, SOB at times and a occ low grade fever over the weekend. He denies any chest congestion/tightness, wheezing, nausea or vomiting. He is requesting a zpak and refill on his hycodan to help with the cough. I explained to him that I would send the message to SN for his recs. He states he will pick up both prescriptions if they are approved. He voiced understanding and had no further questions.   SN please advise  Allergies  Allergen Reactions  . Cefuroxime Axetil     REACTION: unsure of reaction  . Montelukast Sodium     REACTION: unsure of reaction    Current Outpatient Prescriptions:  .  amLODipine (NORVASC) 10 MG tablet, Take 1 tablet (10 mg total) by mouth daily., Disp: 90 tablet, Rfl: 3 .  amoxicillin (AMOXIL) 500 MG tablet, Take 1 tablet (500 mg total) by mouth 3 (three) times daily. (Patient not taking: Reported on 09/24/2015), Disp: 21 tablet, Rfl: 2 .  ascorbic acid (VITAMIN C) 500 MG tablet, Take 1,000 mg by mouth daily. , Disp: , Rfl:  .  aspirin 81 MG tablet, Take 81 mg by mouth daily.  , Disp: , Rfl:  .  Calcium Carbonate-Vitamin D (RA CALCIUM PLUS VITAMIN D) 600-400 MG-UNIT per tablet, Take 1 tablet by mouth daily.  , Disp: , Rfl:  .  Cholecalciferol (VITAMIN D) 2000 UNITS tablet, Take 2,000 Units by mouth daily.  , Disp: , Rfl:  .  clonazePAM (KLONOPIN) 1 MG tablet, Take 1 mg by mouth 2 (two) times daily as needed.  , Disp: , Rfl:  .  Coenzyme Q10 (CO Q 10 PO), Take 1 tablet by mouth daily. , Disp: , Rfl:  .  Cyanocobalamin (B-12) 2000 MCG TABS, Take 1 tablet by mouth daily., Disp: , Rfl:  .  esomeprazole (NEXIUM) 40 MG capsule, take 1 capsule by mouth twice a day 30 MINUTES BEFORE MEALS, Disp: 180 capsule, Rfl: 3 .  fenofibrate (TRICOR) 145 MG tablet, Take 1 tablet (145 mg total) by mouth daily., Disp: 90 tablet, Rfl: 1 .  hydrochlorothiazide (HYDRODIURIL) 25 MG tablet, Take 0.5 tablets  (12.5 mg total) by mouth daily., Disp: 45 tablet, Rfl: 1 .  HYDROcodone-acetaminophen (VICODIN) 2.5-500 MG per tablet, Take 0.5 tablets by mouth 2 (two) times daily as needed. , Disp: , Rfl:  .  HYDROcodone-homatropine (HYCODAN) 5-1.5 MG/5ML syrup, Take 5 mLs by mouth every 6 (six) hours as needed for cough. (Patient not taking: Reported on 09/24/2015), Disp: 480 mL, Rfl: 0 .  Multiple Vitamins-Minerals (CENTRUM SILVER PO), Take 1 tablet by mouth daily. Reported on 03/17/2015, Disp: , Rfl:  .  PARoxetine (PAXIL) 20 MG tablet, Take 20 mg by mouth daily., Disp: , Rfl:  .  Pyridoxine HCl (VITAMIN B-6 CR) 200 MG TBCR, Take 1 tablet by mouth daily.  , Disp: , Rfl:  .  rosuvastatin (CRESTOR) 20 MG tablet, Take 1 tablet (20 mg total) by mouth every other day., Disp: 45 tablet, Rfl: 1 .  testosterone cypionate (DEPOTESTOTERONE CYPIONATE) 200 MG/ML injection, Inject into the muscle every 14 (fourteen) days., Disp: , Rfl:

## 2015-12-03 ENCOUNTER — Other Ambulatory Visit: Payer: Self-pay | Admitting: Internal Medicine

## 2015-12-11 ENCOUNTER — Encounter: Payer: Self-pay | Admitting: Pulmonary Disease

## 2015-12-11 ENCOUNTER — Ambulatory Visit (INDEPENDENT_AMBULATORY_CARE_PROVIDER_SITE_OTHER)
Admission: RE | Admit: 2015-12-11 | Discharge: 2015-12-11 | Disposition: A | Discharge status: Home / Self Care | Payer: Medicare Other | Source: Ambulatory Visit | Attending: Pulmonary Disease | Admitting: Pulmonary Disease

## 2015-12-11 ENCOUNTER — Ambulatory Visit (INDEPENDENT_AMBULATORY_CARE_PROVIDER_SITE_OTHER): Payer: Medicare Other | Admitting: Pulmonary Disease

## 2015-12-11 VITALS — BP 118/66 | HR 74 | Temp 98.3°F | Ht 74.0 in | Wt 277.2 lb

## 2015-12-11 DIAGNOSIS — R05 Cough: Secondary | ICD-10-CM

## 2015-12-11 DIAGNOSIS — R059 Cough, unspecified: Secondary | ICD-10-CM

## 2015-12-11 DIAGNOSIS — R079 Chest pain, unspecified: Secondary | ICD-10-CM | POA: Diagnosis not present

## 2015-12-11 DIAGNOSIS — K219 Gastro-esophageal reflux disease without esophagitis: Secondary | ICD-10-CM

## 2015-12-11 DIAGNOSIS — M150 Primary generalized (osteo)arthritis: Secondary | ICD-10-CM

## 2015-12-11 DIAGNOSIS — E669 Obesity, unspecified: Secondary | ICD-10-CM

## 2015-12-11 DIAGNOSIS — G4733 Obstructive sleep apnea (adult) (pediatric): Secondary | ICD-10-CM | POA: Diagnosis not present

## 2015-12-11 DIAGNOSIS — I10 Essential (primary) hypertension: Secondary | ICD-10-CM | POA: Diagnosis not present

## 2015-12-11 DIAGNOSIS — G629 Polyneuropathy, unspecified: Secondary | ICD-10-CM

## 2015-12-11 DIAGNOSIS — M159 Polyosteoarthritis, unspecified: Secondary | ICD-10-CM

## 2015-12-11 MED ORDER — BACLOFEN 10 MG PO TABS: 10.0000 mg | ORAL_TABLET | Freq: Three times a day (TID) | ORAL | 5 refills | Status: DC

## 2015-12-11 MED ORDER — HYDROCODONE-HOMATROPINE 5-1.5 MG/5ML PO SYRP: 5.0000 mL | ORAL_SOLUTION | Freq: Four times a day (QID) | ORAL | 0 refills | Status: DC | PRN

## 2015-12-11 MED ORDER — AMOXICILLIN 500 MG PO TABS: 500.0000 mg | ORAL_TABLET | Freq: Three times a day (TID) | ORAL | 2 refills | Status: DC

## 2015-12-11 NOTE — Progress Notes (Signed)
Subjective:    Patient ID: Ronald King, male    DOB: [birth year], 70 y.o.   MRN: [id number]  HPI 70 y/o WM here for a follow up visit... he has a chronic cough x years and is dependent on codeine containing cough syrups... he was miserable when ENDAL HD went off the market and we had to substitute PHENERGAN EXPECTORANT w/ CODEINE - 1 tsp by mouth Q6H as needed for cough, limiting him to 1 pint per month; then his insurance company refused the Phenergan Expect & switched to [facility]... His new Primary Care doctor is DrPaz. ~  SEE PREV EPIC NOTES FOR OLDER DATA >>    PFT 11/2011 were essentially wnl w/ FVC=3.98 (76%), FEV1=3.46 (86%), %1sec=87, mid-flows are wnl at 112% predicted...   July 09, 2013:  39mo ROV and Ronald King has established primary care w/ DrPaz> here today for Pulm follow up & refill of his cough syrup...  CXR 4/15 showed norm heart size, clear lungs, NAD.Marland Kitchen.  ~  July 11, 2014:  23mo ROV & Ronald King reports a stable interval- his CC is left hip pain & DrPaz has rec Ortho eval (currently pending); His breathing is OK, chr cough w/o change, occurs in paroxysms, and he denies recent bronchitic infections etc;  He notes some SOB/DOE w/ yard work, but exercises in the gym 3d/wk ([employer]) & he keeps up well... He is followed by DrPaz for Primary Care, DrKerr for Endocrine, DrFeraru for Neurology, and DrSanders for Psyche...     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    Chronic Cough> managed for yrs w/ cough syrup, now Hycodan 1pt per month- 1tsp Q6h prn cough; cough remains dry- he denies sput, hemoptysis, SOB, CP, etc...    Hx upper airway resistance syndrome> prev eval by DrClance- last seen 7/13- responded very well to CPAP despite a fairly neg Sleep Study in 1999; he continues on CPAP nightly & resting well, excellent daytime alertness, etc...    GERD> on Nexium40Bid +  vigorous antireflux regimen (elev HOB 6", npo after dinner, etc); we reviewed LPR problems; he had Nissen fundoplication in 99991111, last EGD was 2010 by DrDBrodie- no acute changes...    Obese> his weight is up 3# to 280 lbs now- BMI= 36 and we reviewed diet, exercise, wt reduction strategies...  We reviewed prob list, meds, xrays and labs> see below for updates >> meds refilled per request...   LABS 3/16 by DrPaz> FLP- at goals;  Chems- ok x BS=121, A1c=6.3;  CBC- wnl...  CXR 06/2104 showed borderline heart size, clear lungs, NAD- DJD in Tspine...  (I personally reviewed the images in Epic)   ~  January 16, 2015:  73mo ROV & Ronald King states "my lungs are acting up this month" c/o incr cough, chest congestion, but denies sput/ CP/ reflux or burning; he denies SOB & states his chr DOE is stable, no change; He wants his hydrocodone cough syrup refilled;  He notes that he was getting tired w/ DOE at [place] started on Testos shots w/ improvement & feeling good now he says...    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    Chronic Cough> managed for yrs w/ cough syrup, now Hycodan 1pt per month- 1tsp Q6h prn  cough; cough remains dry- he denies sput, hemoptysis, SOB, CP, etc...    Hx upper airway resistance syndrome> prev eval by DrClance- last seen 7/13- responded very well to CPAP despite a fairly neg Sleep Study in 1999; he continues on CPAP nightly & resting well, excellent daytime alertness, etc...    GERD> on Nexium40Bid + vigorous antireflux regimen (elev HOB 6", npo after dinner, etc); we reviewed LPR problems; he had Nissen fundoplication in [id number], last EGD was 2010 by DrDBrodie- no acute changes...    Obese> his weight is down 3# to 277 lbs now- BMI= 36 and we reviewed diet, exercise, wt reduction strategies...     Hyperlipidemia>  On Crestor20 and Tricor145 => note Feno145 was started in 2010 when  TG was >400 & Cres20 added by DrPaz in 2014; suggest that he stop the Fenofibrate at this time.    Low-T>  He is on Depo Testos 100mg  Q2wks per DrKerr, Endocrine (seen 11/2014 w/ Low-T, prediabetes, low bone mass & low VitD- note reviewed)    Dysthymia>  On Paxil-CR 25 and Klonopin 1mg  Bid... We reviewed prob list, meds, xrays and labs> see below for updates >> meds refilled per request, his PCP is DrPaz... IMP/PLAN>>  We discussed adding the MUCINEX 1200mg  bid + fluids;  We refilled his HYCODAN 1pt- 1tsp Q6H prn cough...  ~  Aug 07, 2015:  65mo ROV & Ronald King was seen by TP 05/26/15 for his OSA/upper airway resistance syndrome on CPAP16 x yrs w/ prev eval by DrClance (last seen 2013);  He needed a new CPAP machine & wanted the Dream machine=> this was arranged via [facility] & he is quite satisfied;  He has returned on this occais for a CPAP download to meet medicare criteria for on-going payment for his CPAP;  He is resting well, wakes refreshed in the AM 7 denies sleep pressure during the day, energy is satis, no daytime hypersomnolence, etc...  CPAP download required him to bring his machine into the office and it provides the following data for April-May2017> used 30/30 days, ave usage 8H/d, AHI=5.5 on his CPAP16, he did have 16% in leak & is working w/ a new mask at present...  EXAM shows Afeb, VSS, O2sat=94% on RA at rest; Wt=276#, 6'2"Tall, BMI=35;  HEENT- neg- mallampati3;  Chest- decr BS bilat, clear w/o w/r/r;  Heart- RR w/o m/r/g;  Abd- obese, soft, nontender;  Ext- neg w/o c/c/e;  Neuro- intact... IMP/PLAN>>  Ronald King is stable on his CPAP 16 & doing well w/ his new machine, good compliance, tolerated well; Rec to continue same...   ~  December 11, 2015>  70mo ROV & Ronald King is +-stable, c/o rib pain (recently on the right), chr cough as before, "spasms and pain" goes across his back; "My breathing doesn't want to quit hurting", notes bilat pain in ribs c/w an AtypCWP... 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    Chronic Cough> managed for yrs w/ cough syrup, now Hycodan 1pt per month- 1tsp Q6h prn cough; cough remains dry- he denies sput, hemoptysis, SOB, etc...    Hx upper airway resistance syndrome> prev eval by DrClance- last seen 7/13- responded very well to CPAP despite a fairly neg Sleep Study in 1999; he continues on CPAP nightly & resting well, excellent daytime  alertness, etc; we arranged a new machine for him 08/2015 via [facility] & new mask has helped decr the amt of leak...    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 We discussed Rx for this CWP w/ rest/ heat/ lidocaine patches/ & we will add BACLOFEN 10mg  Tid; I indicated next step is prob Pain clinic referral...    Medical issues> followed by DrPaz & DrKerr EXAM shows Afeb, VSS, O2sat=96% on RA at rest; Wt=277#, 6'2"Tall, BMI=35;  HEENT- neg- mallampati3;  Chest- decr BS bilat, clear w/o w/r/r;  Heart- RR w/o m/r/g;  Abd- obese, soft, nontender;  Ext- neg w/o c/c/e;  Neuro- intact...  CXR 12/11/15>  Norm heart size, atherosclerotic changes in Ao, clear lungs- NAD, mod thoracic spondylosis... IMP/PLAN>>  Despite his mult somatic complaints Ronald King is reasonable stable w/ his chr cough, chr sinus, UARS, and diffuse aches and pains;  We discussed Rx w/ rest/ heat to the chest wall/ topical lidocaine patches/ and he will continue his current meds from DrPaz, DrKerr, & DrGibson; he can use MUCINEX OTC for any phlegm, and we are adding BACLOFEN 10mg  tid for the CWP & back spasms; CXR is clear; Amox & Hycodan refilled per his request, he says he will get the 2017 Flu vaccine at  [facility]...            Problem List:       Hx of SINUSITIS >> prev on NASONEX 2spBid + he requests Rx for AMOXICILLIN to keep on hand for travel... prev eval DrByers- "he wanted to do surg"... "my sinuses are crazy" but he refuses to consider the surg... also had dysphonia eval at [facility] in the 90's w/ Botox tried.  OBSTRUCTIVE SLEEP APNEA, UPPER AIRWAY RESISTANCE SYNDROME>> he uses CPAP nightly... saw DrClance 12/09- he felt upper airway resistance syndrome... doing better w/ new CPAP machine in 2010. ~  7/13:  He had f/u DrClance & doing well, no changes made, continue same CPAP... ~  He remains stable on the CPAP, rests satis, no daytime sleepiness issues, etc... ~  08/2015> we contacted [place] regarding new machine 7 to work w/ him regarding mask fit...  COUGH, CHRONIC >> as above... he had an extensive eval in the past w/ GI, ENT, Pulm, etc... he used ENDAL-HD, then  PHEN EXPECT w/ CODEINE, now HYCODAN 1tsp Q6h prn cough, lim to 1pt/mo (Med choice lim by his insurance). ~  Cough was no diff off ACE/ ARB in past & he does not want to change meds... ~  CXR 1/10 showed clear,  just min biapical pleural thickening... ~  2/11: states intol to Symbicort w/ HAs... try ADVAIR 250Bid... ~  CXR 2/11 showed clear lungs, NAD.Marland Kitchen. ~  CXR 9/12 showed chr bronchitic changes, NAD... ~  PFT 9/13 showed FVC=3.98 (76%), FEV1=3.46 (86%), %1sec=87, mid-flows=112% predicted...  ~  CXR 9/13 showed normal heart size, clear lungs, DJD spine... ~  4/15: his insurance company is requesting change from Phenergan expect w/ cod- we will write for Hycodan one tsp Q6h prn & continue to limit to 1pt/mo... ~  CXR 4/15 showed norm heart size, clear lungs, NAD...  ~  10/15: his insurance has again changed the cough syrup, this time from Phenergan expect w/ cod to [facility]- same rx 1tsp Q6h  prn cough, lim to 1pt/mo... 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  ~  CXR 12/11/15>  Norm heart size,  atherosclerotic changes in Ao, clear lungs- NAD, mod thoracic spondylosis   FOLLOWED FOR PRIMARY CARE BY DrPAZ >>   HYPERTENSION (ICD-401.9) - meds adjusted by DrCrenshaw on NORVASC 10mg /d & HCTZ 25mg - 1/2 tab daily...  ~  8/12:  BP= 134/82 & denies HA, fatigue, visual changes, CP, palipit, dizziness, syncope, dyspnea, edema, etc... ~  3/13:  BP= 124/76 & he denies CP, palpit, SOB, edema... ~  9/13:  BP= 110/66 & he remains asymptomatic... ~  He has established Primary Care f/u w/ DrPaz => on Amlod10, Hct12.5, & off prev Lisin; BP= 122/64 today.  DYSLIPIDEMIA (ICD-272.4) - on TRICOR 145mg /d but refuses Statin meds or Lipid Clinic referral... ~  FLP 1/10 showed TChol 161, TG 239, HDL 30, LDL 72... he wants to control w/ diet. ~  FLP 9/10 showed TChol 192, TG 426, HDL 30, LDL 80... rec> start [id number]. ~  [place] 2/11 showed TChol 173, TG 102, HDL 37, LDL 115... continue Tricor Rx + diet. ~  FLP 2/12 showed TChol 221, TG 249, HDL 31, LDL 140... Reminded to take med regularly & get wt down. ~  FLP 8/12 on Tric145 showed TChol 194, TG 169, HDL 32, LDL 128... Still not at goal, refuses statin med. ~  [place] 3/13 on Tric145 showed TChol 229, TG 177, HDL 35, LDL 140... rec to start CRESTOR20 + better low fat diet! ~  FLP 5/13 on Tric145+Cres20 showed TChol 151, TG 188, HDL 33, LDL 80... Continue same. ~  Lipids followed by DrPaz, now on Cres20 monotherapy => labs reviewed in EPIC...  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 discussed diet + exercise and the need for weight reduction both from his overall health status and his OSA... "I moderate what I eat". ~  weight 1/10 = 267# ~  weight 2/11 = 272#... discussed again! ~  Weight 2/12 = 276#... He is unconcerned! ~  Weight 8/12 = 274#... Reviewed diet & exercise needed. ~  Weight 3/13 = 274#... He is NOT trying. ~  Weight 9/13 = 272# ~   Weight 3/14 = 274# ~  Weight 10/14 = 280#  GERD (ICD-530.81) - on NEXIUM 40mg Bid & off the prev hs Reglan... s/p lap nissen 3/01 by DrMartin.... ~  EGD 11/05 showed prev Nissen, +gastritis... ~  EGD 6/10 by DrDBrodie showed functioning Nissen, post-op changes, sessile polyp in distal esoph= benign mucosa.  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 ~  f/u colonoscopy 6/10 by DrDBrodie showed negative x for int hems...  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  Finally started ANDROGEL 5gm packet Qd in 2012 ~  labs 1/10 showed PSA= 1.63 ~  labs 2/11 showed PSA= 1.65,  ~  Labs 1/12 showed Testosterone level = 160-205 ~  Labs 2/12 showed PSA = 1.63, Testosterone level = 553 ~  Labs 8/12 showed Testosterone level = 428... rec continue daily Androgel. ~  Labs 3/13 showed PSA= 1.73, Testosterone level = 185 & states he is using the Androgel Q3rd day, rec to incr to daily. ~  Follow up labs & med adjustments per DrPaz => he referred pt  to Endocrine, DrKerr... 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  VITAMIN D DEFICIENCY (ICD-268.9) - on Vit D OTC 2000 u daily... ~  labs 1/10 showed Vit D level = 16... Vit D 2000 u daily started. ~  labs 2/11 showed Vit D level = 34... continue supplement. ~  Labs 8/12 showed Vit D level = 47... continue same.  HEADACHE (ICD-784.0) - Chr daily migraines in the past w/ prev HA eval by DrFreeman (Botox shots helped in the past)...  NEUROPATHY (ICD-355.9) - c/o burning in legs and eval from Neuro in [facility] & Rx w/ Neurontin + Hydrocodone for pain... this may be part of a familial trait as several relatives have severe periph neuropathy (?hereditary syndrome)- we don't  have notes from [facility] neurology. ~  Pt reports that he was intol to Neurontin, Lyrica, & one other ("memory gaps") & he refused Cymbalta trial... ~  3/13:  Reviewed w/ pt> he continues on VICODIN 2.5-500 from DrGibson up to Tid, 90/mo w/ refills from him... ~  Now followed by [facility], DrFeraru & DrGibson on Vits and Hydrocodone 2.5mg  Bid as needed...   ANXIETY (ICD-300.00) - on PAXIL CR 25mg /d & KLONOPIN 1mg  Bid as needed... they were prev perscribed by ?psychiatrist? but he notes "I've been on these for >75yrs"... he still sees DrSanders for these prescriptions...   Past Surgical History:  Procedure Laterality Date  . ANAL FISSURE REPAIR    . hand fracture surgery     left  . KNEE ARTHROSCOPY  2007   Dr. Marlou Sa  . LAPAROSCOPIC NISSEN FUNDOPLICATION  [id number]   Dr. Hassell Done  . NOSE SURGERY    . TONSILLECTOMY      Outpatient Encounter Prescriptions as of 12/11/2015  Medication Sig Dispense Refill  . amLODipine (NORVASC) 10 MG tablet Take 1 tablet (10 mg total) by mouth daily. 90 tablet 3  . ascorbic acid (VITAMIN C) 500 MG tablet Take 1,000 mg by mouth daily.     Marland Kitchen aspirin 81 MG tablet Take 81 mg by mouth daily.      . Calcium Carbonate-Vitamin D (RA CALCIUM PLUS VITAMIN D) 600-400 MG-UNIT per tablet Take 1 tablet by mouth daily.      . Cholecalciferol (VITAMIN D) 2000 UNITS tablet Take 2,000 Units by mouth daily.      . clonazePAM (KLONOPIN) 1 MG tablet Take 1 mg by mouth 2 (two) times daily as needed.      . Coenzyme Q10 (CO Q 10 PO) Take 1 tablet by mouth daily.     . Cyanocobalamin (B-12) 2000 MCG TABS Take 1 tablet by mouth daily.    Marland Kitchen esomeprazole (NEXIUM) 40 MG capsule take 1 capsule by mouth twice a day 30 MINUTES BEFORE MEALS 180 capsule 3  . fenofibrate (TRICOR) 145 MG tablet Take 1 tablet (145 mg total) by mouth daily. 90 tablet 2  . hydrochlorothiazide (HYDRODIURIL) 25 MG tablet Take 0.5 tablets (12.5 mg total) by mouth daily. 45 tablet 2  . HYDROcodone-acetaminophen (VICODIN)  2.5-500 MG per tablet Take 0.5 tablets by mouth 2 (two) times daily as needed.     Marland Kitchen HYDROcodone-homatropine (HYCODAN) 5-1.5 MG/5ML syrup Take 5 mLs by mouth every 6 (six) hours as needed for cough. 480 mL 0  . Multiple Vitamins-Minerals (CENTRUM SILVER PO) Take 1 tablet by  mouth daily. Reported on 03/17/2015    . PARoxetine (PAXIL) 20 MG tablet Take 20 mg by mouth daily.    . Pyridoxine HCl (VITAMIN B-6 CR) 200 MG TBCR Take 1 tablet by mouth daily.      . rosuvastatin (CRESTOR) 20 MG tablet Take 1 tablet (20 mg total) by mouth every other day. 45 tablet 1  . testosterone cypionate (DEPOTESTOTERONE CYPIONATE) 200 MG/ML injection Inject into the muscle every 14 (fourteen) days.    . [DISCONTINUED] HYDROcodone-homatropine (HYCODAN) 5-1.5 MG/5ML syrup Take 5 mLs by mouth every 6 (six) hours as needed for cough. 480 mL 0  . amoxicillin (AMOXIL) 500 MG tablet Take 1 tablet (500 mg total) by mouth 3 (three) times daily. 21 tablet 2  . baclofen (LIORESAL) 10 MG tablet Take 1 tablet (10 mg total) by mouth 3 (three) times daily. 90 each 5  . [DISCONTINUED] amoxicillin (AMOXIL) 500 MG tablet Take 1 tablet (500 mg total) by mouth 3 (three) times daily. (Patient not taking: Reported on 12/11/2015) 21 tablet 2  . [DISCONTINUED] azithromycin (ZITHROMAX) 250 MG tablet Take as directed (Patient not taking: Reported on 12/11/2015) 6 tablet 0   No facility-administered encounter medications on file as of 12/11/2015.     Allergies  Allergen Reactions  . Cefuroxime Axetil     REACTION: unsure of reaction  . Montelukast Sodium     REACTION: unsure of reaction    Current Medications, Allergies, Past Medical History, Past Surgical History, Family History, and Social History were reviewed in [employer] record.   Review of Systems        See HPI - all other systems neg except as noted...  The patient complains of cough, & dyspnea on exertion.  The patient denies anorexia, fever, weight  loss, weight gain, vision loss, decreased hearing, hoarseness, chest pain, syncope, peripheral edema, headaches, hemoptysis, abdominal pain, melena, hematochezia, severe indigestion/heartburn, hematuria, incontinence, muscle weakness, suspicious skin lesions, transient blindness, difficulty walking, depression, unusual weight change, abnormal bleeding, enlarged lymph nodes, and angioedema.     Objective:   Physical Exam      WD, Obese, 70 y/o WM in NAD... GENERAL:  Alert & oriented; pleasant & cooperative... HEENT:  [place]/AT, EOM-wnl, PERRLA, EACs-clear, TMs-wnl, NOSE-clear, THROAT-clear & wnl. NECK:  Supple w/ fairROM; no JVD; normal carotid impulses w/o bruits; no thyromegaly or nodules palpated; no lymphadenopathy. CHEST:  Clear to P & A; without wheezes/ rales/ or rhonchi; he has an intermittent dry irritative cough... HEART:  Regular Rhythm; without murmurs/ rubs/ or gallops. ABDOMEN:  Obese, soft & nontender; normal bowel sounds; no organomegaly or masses detected. EXT: without deformities, mild arthritic changes; no varicose veins/ venous insuffic/ or edema. NEURO:  CN's intact; motor testing normal; no focal neuro deficits DERM:  No lesions noted; no rash etc...  RADIOLOGY DATA:  Reviewed in the EPIC EMR & discussed w/ the patient...  LABORATORY DATA:  Reviewed in the EPIC EMR & discussed w/ the patient...   Assessment & Plan:    OSA, UARS>  He uses CPAP nightly, continue same...  Chronic Cough>  He is quite dependent on the cough syrup, limited to one pint per month; doesn't want to change meds but insurance won't fill the Phenergan expectorant; change to Hycodan- 1 tsp Q8h prn...  Atyp CWP>  We discussed rest the chest/ apply heat/ topical lidocaine patches/ etc;  We added BACLOFEN 10mg  tid for back spasms...   MEDICAL PROBLEMS FOLLOWED by Dahlia Bailiff, DrFERARU >>  HBP>  Controlled on CCB & Diuretic now, off prev ACE and cough is improved...  DYSLIPIDEMIA>  On Crestor,  Tricor + diet per DrPaz...   Obesity>  We reviewed diet & exercise but he has been noncompliant w/ both...  GI> GERD, IBS, Hems>  Stable on NexiumBid, etc...  GU> BPH, ED, Hypogonadism>  Followed by Lyn Hollingshead, DrKerr, & Urology...  DJD>  Followed by DrDean w/ prev left knee arthroscopy...  Vit D Defic>  On OTC Vit D supplementation & ok...  NEUROPATHY>  Followed by DrFeraru in [place] & intol to Neurontin/ Lyrica, etc; on VICODIN 2.5-500 Tid per Neurology...  ANXIETY>  he still sees Psychologist, occupational, for his Paxil & Klonopin...   Patient's Medications  New Prescriptions   BACLOFEN (LIORESAL) 10 MG TABLET    Take 1 tablet (10 mg total) by mouth 3 (three) times daily.  Previous Medications   AMLODIPINE (NORVASC) 10 MG TABLET    Take 1 tablet (10 mg total) by mouth daily.   ASCORBIC ACID (VITAMIN C) 500 MG TABLET    Take 1,000 mg by mouth daily.    ASPIRIN 81 MG TABLET    Take 81 mg by mouth daily.     CALCIUM CARBONATE-VITAMIN D (RA CALCIUM PLUS VITAMIN D) 600-400 MG-UNIT PER TABLET    Take 1 tablet by mouth daily.     CHOLECALCIFEROL (VITAMIN D) 2000 UNITS TABLET    Take 2,000 Units by mouth daily.     CLONAZEPAM (KLONOPIN) 1 MG TABLET    Take 1 mg by mouth 2 (two) times daily as needed.     COENZYME Q10 (CO Q 10 PO)    Take 1 tablet by mouth daily.    CYANOCOBALAMIN (B-12) 2000 MCG TABS    Take 1 tablet by mouth daily.   ESOMEPRAZOLE (NEXIUM) 40 MG CAPSULE    take 1 capsule by mouth twice a day 30 MINUTES BEFORE MEALS   FENOFIBRATE (TRICOR) 145 MG TABLET    Take 1 tablet (145 mg total) by mouth daily.   HYDROCHLOROTHIAZIDE (HYDRODIURIL) 25 MG TABLET    Take 0.5 tablets (12.5 mg total) by mouth daily.   HYDROCODONE-ACETAMINOPHEN (VICODIN) 2.5-500 MG PER TABLET    Take 0.5 tablets by mouth 2 (two) times daily as needed.    MULTIPLE VITAMINS-MINERALS (CENTRUM SILVER PO)    Take 1 tablet by mouth daily. Reported on 03/17/2015   PAROXETINE (PAXIL) 20 MG TABLET    Take 20 mg by mouth daily.    PYRIDOXINE HCL (VITAMIN B-6 CR) 200 MG TBCR    Take 1 tablet by mouth daily.     ROSUVASTATIN (CRESTOR) 20 MG TABLET    Take 1 tablet (20 mg total) by mouth every other day.   TESTOSTERONE CYPIONATE (DEPOTESTOTERONE CYPIONATE) 200 MG/ML INJECTION    Inject into the muscle every 14 (fourteen) days.  Modified Medications   Modified Medication Previous Medication   AMOXICILLIN (AMOXIL) 500 MG TABLET amoxicillin (AMOXIL) 500 MG tablet      Take 1 tablet (500 mg total) by mouth 3 (three) times daily.    Take 1 tablet (500 mg total) by mouth 3 (three) times daily.   HYDROCODONE-HOMATROPINE (HYCODAN) 5-1.5 MG/5ML SYRUP HYDROcodone-homatropine (HYCODAN) 5-1.5 MG/5ML syrup      Take 5 mLs by mouth every 6 (six) hours as needed for cough.    Take 5 mLs by mouth every 6 (six) hours as needed for cough.  Discontinued Medications   AZITHROMYCIN (ZITHROMAX) 250 MG TABLET  Take as directed

## 2015-12-11 NOTE — Patient Instructions (Signed)
Today we updated your med list in our EPIC system...    Continue your current medications the same...  We refilled your Amoxicillin & Hycodan cough syrup per request...  We added in BACLOFEN 10mg - one tab up to 3 times daily for muscle spasm causing chest wall pain...  Today we rechecked your CXR...    We will contact you w/ the results when available...   Be sure to add the OTC MUCINEX 600mg  1-2 twice daily w/ fluids for congestion...  Stay as active as possible...  Call for any questions...  Let's plan a follow up visit in 38mo, sooner if needed for problems.Marland KitchenMarland Kitchen

## 2015-12-18 LAB — LIPID PANEL
Cholesterol: 165 mg/dL (ref 0–200)
HDL: 33 mg/dL — AB (ref 35–70)
LDL CALC: 81 mg/dL
LDl/HDL Ratio: 5
Triglycerides: 258 mg/dL — AB (ref 40–160)

## 2015-12-18 LAB — BASIC METABOLIC PANEL
BUN: 15 mg/dL (ref 4–21)
Creatinine: 1.2 mg/dL (ref 0.6–1.3)
GLUCOSE: 120 mg/dL
POTASSIUM: 4.2 mmol/L (ref 3.4–5.3)
SODIUM: 143 mmol/L (ref 137–147)

## 2015-12-18 LAB — HEMOGLOBIN A1C: Hemoglobin A1C: 6.3

## 2015-12-18 LAB — CBC AND DIFFERENTIAL
HEMATOCRIT: 47 % (ref 41–53)
Hemoglobin: 15.8 g/dL (ref 13.5–17.5)
Platelets: 236 10*3/uL (ref 150–399)
WBC: 8.4 10*3/mL

## 2015-12-18 LAB — PSA: PSA: 3.51

## 2015-12-18 LAB — HEPATIC FUNCTION PANEL
ALK PHOS: 47 U/L (ref 25–125)
ALT: 18 U/L (ref 10–40)
AST: 19 U/L (ref 14–40)

## 2015-12-24 ENCOUNTER — Encounter: Payer: Self-pay | Admitting: Internal Medicine

## 2016-03-03 ENCOUNTER — Telehealth: Payer: Self-pay | Admitting: Pulmonary Disease

## 2016-03-03 NOTE — Telephone Encounter (Signed)
ATC-NA or VM set up.

## 2016-03-04 NOTE — Telephone Encounter (Signed)
SN  Please advise   This pt. States he has a chronic cough and he is currently out of his cough syrup and he wanted to know if he can have a refill of his Hycodan.

## 2016-03-05 MED ORDER — HYDROCODONE-HOMATROPINE 5-1.5 MG/5ML PO SYRP: 5.0000 mL | ORAL_SOLUTION | Freq: Four times a day (QID) | ORAL | 0 refills | Status: DC | PRN

## 2016-03-05 NOTE — Telephone Encounter (Signed)
rx has been printed out and will call pt once this is ready to be picked up.

## 2016-03-05 NOTE — Telephone Encounter (Signed)
Pt calling back to check on the status of request and would like to hear from someone before lunch he can be reached @ [phone].Ronald King

## 2016-03-05 NOTE — Telephone Encounter (Signed)
Called and spoke with pt and he is aware of rx that is up front and ready to be picked up.

## 2016-03-11 ENCOUNTER — Other Ambulatory Visit (INDEPENDENT_AMBULATORY_CARE_PROVIDER_SITE_OTHER): Payer: Medicare Other

## 2016-03-11 DIAGNOSIS — Z0000 Encounter for general adult medical examination without abnormal findings: Secondary | ICD-10-CM

## 2016-03-11 LAB — FECAL OCCULT BLOOD, IMMUNOCHEMICAL: Fecal Occult Bld: NEGATIVE

## 2016-04-10 ENCOUNTER — Other Ambulatory Visit: Payer: Self-pay | Admitting: Internal Medicine

## 2016-05-07 ENCOUNTER — Telehealth: Payer: Self-pay | Admitting: *Deleted

## 2016-05-07 NOTE — Telephone Encounter (Signed)
Pt declines

## 2016-05-19 ENCOUNTER — Telehealth: Payer: Self-pay | Admitting: Pulmonary Disease

## 2016-05-19 NOTE — Telephone Encounter (Signed)
Spoke with pt. He is requesting a refill on Hycodan. His last OV with SN was on 12/11/15. He has a pending OV on 06/10/16 with SN. Last refill of Hycodan was on 03/05/16 #438mL.  SN - please advise on refill. Thanks.

## 2016-05-20 MED ORDER — HYDROCODONE-HOMATROPINE 5-1.5 MG/5ML PO SYRP: 5.0000 mL | ORAL_SOLUTION | Freq: Four times a day (QID) | ORAL | 0 refills | Status: DC | PRN

## 2016-05-20 NOTE — Telephone Encounter (Signed)
rx has been printed out and placed on SN cart to be signed.  Will call pt once this is done.

## 2016-05-21 NOTE — Telephone Encounter (Signed)
rx has been placed up front to be picked up and pt is aware.

## 2016-05-21 NOTE — Telephone Encounter (Signed)
Pt returned call. Pt states he is wanting to come by today around 2:30p to pick up rx to have this before the weekend.   Dr. Lenna Gilford please advise, this has been placed on SN's cart. Thanks.

## 2016-05-27 ENCOUNTER — Ambulatory Visit (INDEPENDENT_AMBULATORY_CARE_PROVIDER_SITE_OTHER): Payer: Medicare Other | Admitting: Internal Medicine

## 2016-05-27 ENCOUNTER — Telehealth: Payer: Self-pay | Admitting: Internal Medicine

## 2016-05-27 ENCOUNTER — Encounter: Payer: Self-pay | Admitting: Internal Medicine

## 2016-05-27 VITALS — BP 134/68 | HR 77 | Temp 97.7°F | Resp 14 | Ht 76.0 in | Wt 276.5 lb

## 2016-05-27 DIAGNOSIS — E7800 Pure hypercholesterolemia, unspecified: Secondary | ICD-10-CM | POA: Diagnosis not present

## 2016-05-27 DIAGNOSIS — N401 Enlarged prostate with lower urinary tract symptoms: Secondary | ICD-10-CM

## 2016-05-27 DIAGNOSIS — I10 Essential (primary) hypertension: Secondary | ICD-10-CM

## 2016-05-27 DIAGNOSIS — C61 Malignant neoplasm of prostate: Secondary | ICD-10-CM | POA: Insufficient documentation

## 2016-05-27 DIAGNOSIS — R350 Frequency of micturition: Secondary | ICD-10-CM

## 2016-05-27 DIAGNOSIS — H6122 Impacted cerumen, left ear: Secondary | ICD-10-CM | POA: Diagnosis not present

## 2016-05-27 DIAGNOSIS — N400 Enlarged prostate without lower urinary tract symptoms: Secondary | ICD-10-CM | POA: Insufficient documentation

## 2016-05-27 NOTE — Progress Notes (Signed)
Subjective:    Patient ID: Ronald King, male    DOB: 16-Oct-1945, 71 y.o.   MRN: [id number]  DOS:  05/27/2016 Type of visit - description : rov Interval history: In general feeling well. Recently, his PSA was elevated, went to see urology, they recommend conservative treatment. Patient disagreed with that opinion, felt like he had infection and took some leftover Bactrim and amoxicillin which cleared his symptoms. OSA - compliance with CPAP is partial. HTN: Self discontinue HCTZ several months ago, he felt it was affecting his prostate. Ambulatory BPs remain in the 130s. Cerumen impaction?  Review of Systems No dysuria or gross hematuria; from time to time has difficulty urinating. Occasional testicular discomfort, patient's thinks is due to a "prostate infection".  Past Medical History:  Diagnosis Date  . Anemia   . Anxiety   . Chronic bronchitis   . Chronic cough   . Diabetes ([place]) 07-2011   per Dr Buddy Duty  . DJD (degenerative joint disease)   . Dyslipidemia   . Esophageal polyp   . GERD (gastroesophageal reflux disease)    s/p [employer] F.  . Hemorrhoids, internal   . History of gastrointestinal hemorrhage   . Hypertension   . Hypogonadism, male    per Dr Buddy Duty  . Neuropathy ([place])    per neuro-Dr Ferrarou vicodin  . OSA (obstructive sleep apnea)    on CPAP  . Vitamin D deficiency     Past Surgical History:  Procedure Laterality Date  . ANAL FISSURE REPAIR    . hand fracture surgery     left  . KNEE ARTHROSCOPY  2007   Dr. Marlou Sa  . LAPAROSCOPIC NISSEN FUNDOPLICATION  [id number]   Dr. Hassell Done  . NOSE SURGERY    . TONSILLECTOMY      Social History   Social History  . Marital status: Married    Spouse name: N/A  . Number of children: 1  . Years of education: N/A   Occupational History  . Technical brewer, retired     retired  . 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 Smoking status: Former Smoker    Packs/day: 1.00    Years: 3.00    Quit date: 03/29/1965  . Smokeless tobacco: Never Used  . Alcohol use No  . Drug use: No  . Sexual activity: Not on file   Other Topics Concern  . Not on file   Social History Narrative   Lives w/ wife      Allergies as of 05/27/2016      Reactions   Montelukast Shortness Of Breath   Cefuroxime Axetil    REACTION: unsure of reaction   Gabapentin Other (See Comments)   Drowsiness    Montelukast Sodium    REACTION: unsure of reaction   Pregabalin Other (See Comments)   Drowsiness      Medication List       Accurate as of 05/27/16  9:55 PM. Always use your most recent med list.          amLODipine 10 MG tablet Commonly known as:  NORVASC Take 1 tablet (10 mg total) by mouth daily.   amoxicillin 500 MG tablet Commonly known as:  AMOXIL Take 1 tablet (500 mg total) by mouth 3 (three) times daily.   ascorbic acid 500 MG tablet Commonly known as:  VITAMIN C Take 1,000 mg by mouth daily.   aspirin 81 MG tablet Take 81  mg by mouth daily.   B-12 2000 MCG Tabs Take 1 tablet by mouth daily.   baclofen 10 MG tablet Commonly known as:  LIORESAL Take 1 tablet (10 mg total) by mouth 3 (three) times daily.   CENTRUM SILVER PO Take 1 tablet by mouth daily. Reported on 03/17/2015   clonazePAM 1 MG tablet Commonly known as:  KLONOPIN Take 1 mg by mouth 2 (two) times daily as needed.   CO Q 10 PO Take 1 tablet by mouth daily.   esomeprazole 40 MG capsule Commonly known as:  NEXIUM take 1 capsule by mouth twice a day 30 MINUTES BEFORE MEALS   fenofibrate 145 MG tablet Commonly known as:  TRICOR Take 1 tablet (145 mg total) by mouth daily.   HYDROcodone-acetaminophen 2.5-500 MG tablet Commonly known as:  VICODIN Take 0.5 tablets by mouth 2 (two) times daily as needed.   HYDROcodone-homatropine 5-1.5 MG/5ML syrup Commonly known as:  HYCODAN Take 5 mLs by mouth every 6 (six) hours as needed for cough.   PARoxetine 20 MG tablet Commonly known as:   PAXIL Take 20 mg by mouth daily.   Pyridoxine HCl 200 MG Tbcr Take 1 tablet by mouth daily.   RA CALCIUM PLUS VITAMIN D 600-400 MG-UNIT tablet Generic drug:  Calcium Carbonate-Vitamin D Take 1 tablet by mouth daily.   rosuvastatin 20 MG tablet Commonly known as:  CRESTOR Take 1 tablet (20 mg total) by mouth every other day.   testosterone cypionate 200 MG/ML injection Commonly known as:  DEPOTESTOSTERONE CYPIONATE Inject into the muscle every 21 ( twenty-one) days.   Vitamin D 2000 units tablet Take 2,000 Units by mouth daily.          Objective:   Physical Exam BP 134/68 (BP Location: Left Arm, Patient Position: Sitting, Cuff Size: Normal)   Pulse 77   Temp 97.7 F (36.5 C) (Oral)   Resp 14   Ht 6\' 4"  (1.93 m)   Wt 276 lb 8 oz (125.4 kg)   SpO2 96%   BMI 33.66 kg/m  General:   Well developed, well nourished . NAD.  HEENT:  Normocephalic . Face symmetric, atraumatic. Left ear: + Cerumen impaction, removed most of it with a spoon. right ear clear Lungs:  CTA B Normal respiratory effort, no intercostal retractions, no accessory muscle use. Heart: RRR,  no murmur.  No pretibial edema bilaterally  Skin: Not pale. Not jaundice Neurologic:  alert & oriented X3.  Speech normal, gait appropriate for age and unassisted Psych--  Cognition and judgment appear intact.  Cooperative with normal attention span and concentration.  Behavior appropriate. No anxious or depressed appearing.      Assessment & Plan:    Assessment  DM, Dr. Buddy Duty HTN Dislipidemia Hypogonadism, Dr. Buddy Duty + Neuropathy , Dr Tonia Ghent, on Vicodin  Obesity, BMI 33 Anxiety -- paxil- clonazepam (per Dr Tye Savoy) Vitamin D deficiency PULM:  --OSA, CPAP --Chronic cough-- Dr Lenna Gilford DJD, Dr. Marlou Sa GU:  BPH ED GI: --GERD, status post Nissen fundoplication --Esophageal polyp -- h/o GI bleed- tranfussion (90s)  PLAN: DM: Per endocrinology HTN: Self discontinue HCTZ, amb BPs remain very  good. Continue amlodipine. BPH: Few months ago, PSA was found to be elevated by endo, was referred to urology, they rx observation, patient thinks that he has prostate infections on and off  "all his life", decided to take a leftover Bactrim and amoxicillin. Advised patient to avoid taking abx without a clear indication, if he is not satisfied with the  urology eval  recommend to seek a second opinion through endocrinology. High cholesterol: Last FLP satisfactory, on Crestor. Cerumen impaction: Cleared most of the wax w/  a spoon, recommend peroxide as needed. RTC 6-7 months, CPX. Medicare wellness at his convenience

## 2016-05-27 NOTE — Telephone Encounter (Signed)
09/12/14 PR PPPS, SUBSEQ VISIT [id number] patient scheduled AWV with Glenard Haring 12/09/16 at 9am and CPE with PCP at 10am

## 2016-05-27 NOTE — Patient Instructions (Signed)
  GO TO THE FRONT DESK Schedule your next appointment for a  Physical in 6-7 months   Schedule a Medicare wellness at your convenience

## 2016-05-27 NOTE — Assessment & Plan Note (Signed)
DM: Per endocrinology HTN: Self discontinue HCTZ, amb BPs remain very good. Continue amlodipine. BPH: Few months ago, PSA was found to be elevated by endo, was referred to urology, they rx observation, patient thinks that he has prostate infections on and off  "all his life", decided to take a leftover Bactrim and amoxicillin. Advised patient to avoid taking abx without a clear indication, if he is not satisfied with the urology eval  recommend to seek a second opinion through endocrinology. High cholesterol: Last FLP satisfactory, on Crestor. Cerumen impaction: Cleared most of the wax w/  a spoon, recommend peroxide as needed. RTC 6-7 months, CPX. Medicare wellness at his convenience

## 2016-05-27 NOTE — Progress Notes (Signed)
Pre visit review using our clinic review tool, if applicable. No additional management support is needed unless otherwise documented below in the visit note. 

## 2016-06-10 ENCOUNTER — Ambulatory Visit (INDEPENDENT_AMBULATORY_CARE_PROVIDER_SITE_OTHER): Payer: Medicare Other | Admitting: Pulmonary Disease

## 2016-06-10 VITALS — BP 152/70 | HR 78 | Temp 99.4°F | Ht 74.0 in | Wt 276.0 lb

## 2016-06-10 DIAGNOSIS — M150 Primary generalized (osteo)arthritis: Secondary | ICD-10-CM

## 2016-06-10 DIAGNOSIS — K219 Gastro-esophageal reflux disease without esophagitis: Secondary | ICD-10-CM

## 2016-06-10 DIAGNOSIS — M159 Polyosteoarthritis, unspecified: Secondary | ICD-10-CM

## 2016-06-10 DIAGNOSIS — R0789 Other chest pain: Secondary | ICD-10-CM | POA: Diagnosis not present

## 2016-06-10 DIAGNOSIS — I10 Essential (primary) hypertension: Secondary | ICD-10-CM | POA: Diagnosis not present

## 2016-06-10 DIAGNOSIS — Z6835 Body mass index (BMI) 35.0-35.9, adult: Secondary | ICD-10-CM

## 2016-06-10 DIAGNOSIS — G4733 Obstructive sleep apnea (adult) (pediatric): Secondary | ICD-10-CM

## 2016-06-10 DIAGNOSIS — E6609 Other obesity due to excess calories: Secondary | ICD-10-CM

## 2016-06-10 DIAGNOSIS — G629 Polyneuropathy, unspecified: Secondary | ICD-10-CM | POA: Diagnosis not present

## 2016-06-10 DIAGNOSIS — R05 Cough: Secondary | ICD-10-CM | POA: Diagnosis not present

## 2016-06-10 DIAGNOSIS — R059 Cough, unspecified: Secondary | ICD-10-CM

## 2016-06-10 MED ORDER — AMOXICILLIN 500 MG PO TABS: 500.0000 mg | ORAL_TABLET | Freq: Three times a day (TID) | ORAL | 1 refills | Status: DC

## 2016-06-10 MED ORDER — HYDROCODONE-HOMATROPINE 5-1.5 MG/5ML PO SYRP: 5.0000 mL | ORAL_SOLUTION | Freq: Three times a day (TID) | ORAL | 0 refills | Status: DC | PRN

## 2016-06-10 NOTE — Patient Instructions (Signed)
Today we updated your med list in our EPIC system...    Continue your current medications the same...  We refilled your AMOXICILLIN prescription today per your request...  We also wrote a new Rx for [facility] & predated it to 06/17/16 (one month since your last refill)...  Let's get on track w/ our diet, exercise, wt reduction program!  Call for any questions...  Let's plan a follow up visit in 69mo sooner if needed for problems.Marland KitchenMarland Kitchen

## 2016-06-13 ENCOUNTER — Encounter: Payer: Self-pay | Admitting: Pulmonary Disease

## 2016-06-13 NOTE — Progress Notes (Signed)
Subjective:    Patient ID: Ronald King, male    DOB: [birth year], 71 y.o.   MRN: [id number]  HPI 71 y/o WM here for a follow up visit... he has a chronic cough x years and is dependent on codeine containing cough syrups... he was miserable when ENDAL HD went off the market and we had to substitute PHENERGAN EXPECTORANT w/ CODEINE - 1 tsp by mouth Q6H as needed for cough, limiting him to 1 pint per month; then his insurance company refused the Phenergan Expect & switched to [facility]... His new Primary Care doctor is DrPaz. ~  SEE PREV EPIC NOTES FOR OLDER DATA >>     PFT 11/2011 were essentially wnl w/ FVC=3.98 (76%), FEV1=3.46 (86%), %1sec=87, mid-flows are wnl at 112% predicted...   July 09, 2013:  93mo ROV and Ronald King has established primary care w/ DrPaz> here today for Pulm follow up & refill of his cough syrup...  CXR 4/15 showed norm heart size, clear lungs, NAD.Marland Kitchen.   ~  July 11, 2014:  12mo ROV & Ronald King reports a stable interval- his CC is left hip pain & DrPaz has rec Ortho eval (currently pending); His breathing is OK, chr cough w/o change, occurs in paroxysms, and he denies recent bronchitic infections etc;  He notes some SOB/DOE w/ yard work, but exercises in the gym 3d/wk ([employer]) & he keeps up well... He is followed by DrPaz for Primary Care, DrKerr for Endocrine, DrFeraru for Neurology, and DrSanders for Psyche...     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    Chronic Cough> managed for yrs w/ cough syrup, now Hycodan 1pt per month- 1tsp Q6h prn cough; cough remains dry- he denies sput, hemoptysis, SOB, CP, etc...    Hx upper airway resistance syndrome> prev eval by DrClance- last seen 7/13- responded very well to CPAP despite a fairly neg Sleep Study in 1999; he continues on CPAP nightly & resting well, excellent daytime alertness, etc...    GERD> on Nexium40Bid +  vigorous antireflux regimen (elev HOB 6", npo after dinner, etc); we reviewed LPR problems; he had Nissen fundoplication in 5784, last EGD was 2010 by DrDBrodie- no acute changes...    Obese> his weight is up 3# to 280 lbs now- BMI= 36 and we reviewed diet, exercise, wt reduction strategies...  We reviewed prob list, meds, xrays and labs> see below for updates >> meds refilled per request...   LABS 3/16 by DrPaz> FLP- at goals;  Chems- ok x BS=121, A1c=6.3;  CBC- wnl...  CXR 06/2104 showed borderline heart size, clear lungs, NAD- DJD in Tspine...  (I personally reviewed the images in Epic)   ~  January 16, 2015:  70mo ROV & Ronald King states "my lungs are acting up this month" c/o incr cough, chest congestion, but denies sput/ CP/ reflux or burning; he denies SOB & states his chr DOE is stable, no change; He wants his hydrocodone cough syrup refilled;  He notes that he was getting tired w/ DOE at [place] started on Testos shots w/ improvement & feeling good now he says...    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    Chronic Cough> managed for yrs w/ cough syrup, now Hycodan 1pt per month- 1tsp  Q6h prn cough; cough remains dry- he denies sput, hemoptysis, SOB, CP, etc...    Hx upper airway resistance syndrome> prev eval by DrClance- last seen 7/13- responded very well to CPAP despite a fairly neg Sleep Study in 1999; he continues on CPAP nightly & resting well, excellent daytime alertness, etc...    GERD> on Nexium40Bid + vigorous antireflux regimen (elev HOB 6", npo after dinner, etc); we reviewed LPR problems; he had Nissen fundoplication in 1062, last EGD was 2010 by DrDBrodie- no acute changes...    Obese> his weight is down 3# to 277 lbs now- BMI= 36 and we reviewed diet, exercise, wt reduction strategies...     Hyperlipidemia>  On Crestor20 and Tricor145 => note Feno145 was started in 2010 when  TG was >400 & Cres20 added by DrPaz in 2014; suggest that he stop the Fenofibrate at this time.    Low-T>  He is on Depo Testos 100mg  Q2wks per DrKerr, Endocrine (seen 11/2014 w/ Low-T, prediabetes, low bone mass & low VitD- note reviewed)    Dysthymia>  On Paxil-CR 25 and Klonopin 1mg  Bid... We reviewed prob list, meds, xrays and labs> see below for updates >> meds refilled per request, his PCP is DrPaz... IMP/PLAN>>  We discussed adding the MUCINEX 1200mg  bid + fluids;  We refilled his HYCODAN 1pt- 1tsp Q6H prn cough...  ~  Aug 07, 2015:  35mo ROV & Ronald King was seen by TP 05/26/15 for his OSA/upper airway resistance syndrome on CPAP16 x yrs w/ prev eval by DrClance (last seen 2013);  He needed a new CPAP machine & wanted the Dream machine=> this was arranged via [facility] & he is quite satisfied;  He has returned on this occais for a CPAP download to meet medicare criteria for on-going payment for his CPAP;  He is resting well, wakes refreshed in the AM 7 denies sleep pressure during the day, energy is satis, no daytime hypersomnolence, etc...  CPAP download required him to bring his machine into the office and it provides the following data for April-May2017> used 30/30 days, ave usage 8H/d, AHI=5.5 on his CPAP16, he did have 16% in leak & is working w/ a new mask at present...  EXAM shows Afeb, VSS, O2sat=94% on RA at rest; Wt=276#, 6'2"Tall, BMI=35;  HEENT- neg- mallampati3;  Chest- decr BS bilat, clear w/o w/r/r;  Heart- RR w/o m/r/g;  Abd- obese, soft, nontender;  Ext- neg w/o c/c/e;  Neuro- intact... IMP/PLAN>>  Ronald King is stable on his CPAP 16 & doing well w/ his new machine, good compliance, tolerated well; Rec to continue same...   ~  December 11, 2015>  55mo ROV & Ronald King is +-stable, c/o rib pain (recently on the right), chr cough as before, "spasms and pain" goes across his back; "My breathing doesn't want to quit hurting", notes bilat pain in ribs c/w an AtypCWP... 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    Chronic Cough> managed for yrs w/ cough syrup, now Hycodan 1pt per month- 1tsp Q6h prn cough; cough remains dry- he denies sput, hemoptysis, SOB, etc...    Hx upper airway resistance syndrome> prev eval by DrClance- last seen 7/13- responded very well to CPAP despite a fairly neg Sleep Study in 1999; he continues on CPAP nightly & resting well,  excellent daytime alertness, etc; we arranged a new machine for him 08/2015 via [facility] & new mask has helped decr the amt of leak...    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 We discussed Rx for this CWP w/ rest/ heat/ lidocaine patches/ & we will add BACLOFEN 10mg  Tid; I indicated next step is prob Pain clinic referral...    Medical issues> followed by DrPaz & DrKerr EXAM shows Afeb, VSS, O2sat=96% on RA at rest; Wt=277#, 6'2"Tall, BMI=35;  HEENT- neg- mallampati3;  Chest- decr BS bilat, clear w/o w/r/r;  Heart- RR w/o m/r/g;  Abd- obese, soft, nontender;  Ext- neg w/o c/c/e;  Neuro- intact...  CXR 12/11/15>  Norm heart size, atherosclerotic changes in Ao, clear lungs- NAD, mod thoracic spondylosis... IMP/PLAN>>  Despite his mult somatic complaints Ronald King is reasonable stable w/ his chr cough, chr sinus, UARS, and diffuse aches and pains;  We discussed Rx w/ rest/ heat to the chest wall/ topical lidocaine patches/ and he will continue his current meds from DrPaz, DrKerr, & DrGibson; he can use MUCINEX OTC for any phlegm, and we are adding BACLOFEN 10mg  tid for the CWP & back spasms; CXR is clear; Amox & Hycodan refilled per his request, he says he will get the 2017 Flu vaccine at  [facility]...    ~  June 10, 2016:  63m Ronald King tells me that he has had several episodes of bronchitis over the last few months "I take Amox for a few days and it eases up" but I told him to take the whole cours of Ab for 7-10 days to knock it out... Currently notes mild cough (esp Qhs), dry- no phlegm, no hemoptysis, chr stable SOB/DOE, still at 276# and says he's exercising 3x/wk (states that he lost to 260#, then regained 15# in 1 day!);      Hx AR, chr sinusitis, chr cough> on Hycodan prn & lim to 1pt/mo; he has Amox500 to keep on hand & uses prn; no pos cultures or pathogens identified...    UARS> on CPAP Qhs, [facility] DME, no issues w/ mask/ pressure/ says he uses it regularly, rests well, wakes refreshed & no daytime hypersomnolence...    HBP, Chr CWP> AtypCWP on Amlod10, BP= 150/70, we reviewed diet/ exercise/ lose wt...     PCP=> DrPaz & last seen 05/27/16-- noted that pt self discontinued his HCT; followed for HBP, HL, DM, Obesity, BPH w/ elev PSA, etc...    Endocrine=> DrKerr followed for pre-DM, Low-T & last seen 12/18/15-- BS=120, A1c=6.3, PSA=3.51, CBC- ok w/ Hg=15.8, Cr=1.16, Testos=373 on shots q2wks...     Neuro=> DrFeraru in [place]-- seen 12/12/15 for leg pain, migraines, LBP, periph neuropathy; she writes for [employer], he has had MRI of Cspine & Lspine & her notes are reviewed... EXAM shows Afeb, VSS, O2sat=96% on RA at rest; Wt=276#, 6'2"Tall, BMI=35;  HEENT- neg- mallampati3;  Chest- decr BS bilat, clear w/o w/r/r;  Heart- RR w/o m/r/g;  Abd- obese, soft, nontender;  Ext- neg w/o c/c/e;  Neuro- intact... IMP/PLAN>>  Ronald King is overall stable from the pulmonary standpoint; we refilled his Hycodan for 1 pt / month and refilled the Amoxicillin that he insists that he keep on had; he will call for problems & we plan rov recheck 64mo.Marland KitchenMarland Kitchen  Problem List:       Hx of SINUSITIS >> prev on NASONEX 2spBid + he requests Rx for AMOXICILLIN to keep on hand for travel... prev eval DrByers- "he wanted  to do surg"... "my sinuses are crazy" but he refuses to consider the surg... also had dysphonia eval at [facility] in the 90's w/ Botox tried.  OBSTRUCTIVE SLEEP APNEA, UPPER AIRWAY RESISTANCE SYNDROME>> he uses CPAP nightly... saw DrClance 12/09- he felt upper airway resistance syndrome... doing better w/ new CPAP machine in 2010. ~  7/13:  He had f/u DrClance & doing well, no changes made, continue same CPAP... ~  He remains stable on the CPAP, rests satis, no daytime sleepiness issues, etc... ~  08/2015> we contacted [place] regarding new machine 7 to work w/ him regarding mask fit...  COUGH, CHRONIC >> as above... he had an extensive eval in the past w/ GI, ENT, Pulm, etc... he used ENDAL-HD, then  PHEN EXPECT w/ CODEINE, now HYCODAN 1tsp Q6h prn cough, lim to 1pt/mo (Med choice lim by his insurance). ~  Cough was no diff off ACE/ ARB in past & he does not want to change meds... ~  CXR 1/10 showed clear,  just min biapical pleural thickening... ~  2/11: states intol to Symbicort w/ HAs... try ADVAIR 250Bid... ~  CXR 2/11 showed clear lungs, NAD.Marland Kitchen. ~  CXR 9/12 showed chr bronchitic changes, NAD... ~  PFT 9/13 showed FVC=3.98 (76%), FEV1=3.46 (86%), %1sec=87, mid-flows=112% predicted...  ~  CXR 9/13 showed normal heart size, clear lungs, DJD spine... ~  4/15: his insurance company is requesting change from Phenergan expect w/ cod- we will write for Hycodan one tsp Q6h prn & continue to limit to 1pt/mo... ~  CXR 4/15 showed norm heart size, clear lungs, NAD...  ~  10/15: his insurance has again changed the cough syrup, this time from Phenergan expect w/ cod to [facility]- same rx 1tsp Q6h prn cough, lim to 1pt/mo... 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  ~  CXR 12/11/15>  Norm heart size, atherosclerotic changes in Ao, clear lungs- NAD, mod thoracic spondylosis   FOLLOWED FOR PRIMARY CARE BY DrPAZ >>   HYPERTENSION (ICD-401.9) - meds adjusted by DrCrenshaw on  NORVASC 10mg /d & HCTZ 25mg - 1/2 tab daily...  ~  8/12:  BP= 134/82 & denies HA, fatigue, visual changes, CP, palipit, dizziness, syncope, dyspnea, edema, etc... ~  3/13:  BP= 124/76 & he denies CP, palpit, SOB, edema... ~  9/13:  BP= 110/66 & he remains asymptomatic... ~  He has established Primary Care f/u w/ DrPaz => on Amlod10, Hct12.5, & off prev Lisin; BP= 122/64 today.  DYSLIPIDEMIA (ICD-272.4) - on TRICOR 145mg /d but refuses Statin meds or Lipid Clinic referral... ~  FLP 1/10 showed TChol 161, TG 239, HDL 30, LDL 72... he wants to control w/ diet. ~  FLP 9/10 showed TChol 192, TG 426, HDL 30, LDL 80... rec> start [id number]. ~  [place] 2/11 showed TChol 173, TG 102, HDL 37, LDL 115... continue Tricor Rx + diet. ~  FLP 2/12 showed TChol 221, TG 249, HDL 31, LDL 140... Reminded to take med regularly & get wt down. ~  FLP 8/12 on Tric145 showed TChol 194, TG 169, HDL 32, LDL 128... Still not at goal, refuses statin med. ~  [place] 3/13 on Tric145 showed TChol 229, TG 177, HDL 35, LDL 140... rec to start CRESTOR20 + better low fat diet! ~  FLP 5/13 on Tric145+Cres20 showed TChol 151, TG 188, HDL 33, LDL 80... Continue same. ~  Lipids followed by DrPaz, now on Cres20 monotherapy => labs reviewed in EPIC...  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 discussed diet + exercise and the need for weight reduction both from his overall health status and his OSA... "I moderate what I eat". ~  weight 1/10 = 267# ~  weight 2/11 = 272#... discussed again! ~  Weight 2/12 = 276#... He is unconcerned! ~  Weight 8/12 = 274#... Reviewed diet & exercise needed. ~  Weight 3/13 = 274#... He is NOT trying. ~  Weight 9/13 = 272# ~  Weight 3/14 = 274# ~  Weight 10/14 = 280#  GERD (ICD-530.81) - on NEXIUM 40mg Bid & off the prev hs Reglan... s/p lap nissen 3/01 by DrMartin.... ~  EGD 11/05 showed prev Nissen,  +gastritis... ~  EGD 6/10 by DrDBrodie showed functioning Nissen, post-op changes, sessile polyp in distal esoph= benign mucosa.  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 ~  f/u colonoscopy 6/10 by DrDBrodie showed negative x for int hems...  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  Finally started ANDROGEL 5gm packet Qd in 2012 ~  labs 1/10 showed PSA= 1.63 ~  labs 2/11 showed PSA= 1.65,  ~  Labs 1/12 showed Testosterone level = 160-205 ~  Labs 2/12 showed PSA = 1.63, Testosterone level = 553 ~  Labs 8/12 showed Testosterone level = 428... rec continue daily Androgel. ~  Labs 3/13 showed PSA= 1.73, Testosterone level = 185 & states he is using the Androgel Q3rd day, rec to incr to daily. ~  Follow up labs & med adjustments per DrPaz => he referred pt to Endocrine, DrKerr... 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  VITAMIN D DEFICIENCY (ICD-268.9) - on Vit D OTC 2000 u daily... ~  labs 1/10 showed Vit D level = 16... Vit D 2000 u daily started. ~  labs 2/11 showed Vit D level = 34... continue supplement. ~  Labs 8/12 showed Vit D level = 47... continue same.  HEADACHE (ICD-784.0) - Chr daily migraines in the past w/ prev HA eval by DrFreeman (Botox shots helped in the past)...  NEUROPATHY (ICD-355.9) - c/o burning in legs and eval from Neuro in [facility] & Rx w/ Neurontin + Hydrocodone for pain... this may be part of a familial trait as several relatives have severe periph neuropathy (?hereditary syndrome)- we don't have notes from [facility] neurology. ~  Pt reports that he was intol to Neurontin, Lyrica, & one other ("memory gaps") & he refused Cymbalta trial... ~  3/13:  Reviewed w/ pt> he continues  on VICODIN 2.5-500 from DrGibson up to Tid, 90/mo w/ refills from him... ~  Now followed by [facility], DrFeraru & DrGibson on Vits and Hydrocodone 2.5mg  Bid as needed...   ANXIETY (ICD-300.00) - on PAXIL CR 25mg /d & KLONOPIN 1mg  Bid as needed... they were prev perscribed by ?psychiatrist? but he notes "I've been on these for >83yrs"... he still sees DrSanders for these prescriptions...   Past Surgical History:  Procedure Laterality Date  . ANAL FISSURE REPAIR    . hand fracture surgery     left  . KNEE ARTHROSCOPY  2007   Dr. Marlou Sa  . LAPAROSCOPIC NISSEN FUNDOPLICATION  04/2295   Dr. Hassell Done  . NOSE SURGERY    . TONSILLECTOMY      Outpatient Encounter Prescriptions as of 06/10/2016  Medication Sig  . amLODipine (NORVASC) 10 MG tablet Take 1 tablet (10 mg total) by mouth daily.  Marland Kitchen amoxicillin (AMOXIL) 500 MG tablet Take 1 tablet (500 mg total) by mouth 3 (three) times daily. As directed  . ascorbic acid (VITAMIN C) 500 MG tablet Take 1,000 mg by mouth daily.   Marland Kitchen aspirin 81 MG tablet Take 81 mg by mouth daily.    . baclofen (LIORESAL) 10 MG tablet Take 1 tablet (10 mg total) by mouth 3 (three) times daily.  . Calcium Carbonate-Vitamin D (RA CALCIUM PLUS VITAMIN D) 600-400 MG-UNIT per tablet Take 1 tablet by mouth daily.    . Cholecalciferol (VITAMIN D) 2000 UNITS tablet Take 2,000 Units by mouth daily.    . clonazePAM (KLONOPIN) 1 MG tablet Take 1 mg by mouth 2 (two) times daily as needed.    . Coenzyme Q10 (CO Q 10 PO) Take 1 tablet by mouth daily.   . Cyanocobalamin (B-12) 2000 MCG TABS Take 1 tablet by mouth daily.  Marland Kitchen esomeprazole (NEXIUM) 40 MG capsule take 1 capsule by mouth twice a day 30 MINUTES BEFORE MEALS  . fenofibrate (TRICOR) 145 MG tablet Take 1 tablet (145 mg total) by mouth daily.  Marland Kitchen HYDROcodone-acetaminophen (VICODIN) 2.5-500 MG per tablet Take 0.5 tablets by mouth 2 (two) times daily as needed.   Marland Kitchen HYDROcodone-homatropine (HYCODAN) 5-1.5 MG/5ML syrup Take 5 mLs by  mouth every 8 (eight) hours as needed for cough. DO NOT FILL BEFORE 06/17/2016  . Multiple Vitamins-Minerals (CENTRUM SILVER PO) Take 1 tablet by mouth daily. Reported on 03/17/2015  . PARoxetine (PAXIL) 20 MG tablet Take 20 mg by mouth daily.  . Pyridoxine HCl (VITAMIN B-6 CR) 200 MG TBCR Take 1 tablet by mouth daily.    . rosuvastatin (CRESTOR) 20 MG tablet Take 1 tablet (20 mg total) by mouth every other day.  . testosterone cypionate (DEPOTESTOTERONE CYPIONATE) 200 MG/ML injection Inject into the muscle every 21 ( twenty-one) days.   . [DISCONTINUED] amoxicillin (AMOXIL) 500 MG tablet Take 1 tablet (500 mg total) by mouth 3 (three) times daily.  . [DISCONTINUED] HYDROcodone-homatropine (HYCODAN) 5-1.5 MG/5ML syrup Take 5 mLs by mouth every 6 (six) hours as needed for cough.   No facility-administered encounter medications on file as of 06/10/2016.     Allergies  Allergen Reactions  . Montelukast Shortness Of Breath  . Cefuroxime Axetil     REACTION: unsure of reaction  . Gabapentin Other (See Comments)    Drowsiness   . Montelukast Sodium     REACTION: unsure of reaction  . Pregabalin Other (See Comments)    Drowsiness    Current Medications, Allergies, Past Medical History, Past Surgical History, Family History, and Social History were reviewed in [employer] record.   Review of Systems        See HPI - all other systems neg except as noted...  The patient complains of cough, & dyspnea on exertion.  The patient denies anorexia, fever, weight loss, weight gain, vision loss, decreased hearing, hoarseness, chest pain, syncope, peripheral edema, headaches, hemoptysis, abdominal pain, melena, hematochezia, severe indigestion/heartburn, hematuria, incontinence, muscle  weakness, suspicious skin lesions, transient blindness, difficulty walking, depression, unusual weight change, abnormal bleeding, enlarged lymph nodes, and angioedema.     Objective:   Physical  Exam      WD, Obese, 71 y/o WM in NAD... GENERAL:  Alert & oriented; pleasant & cooperative... HEENT:  Burns/AT, EOM-wnl, PERRLA, EACs-clear, TMs-wnl, NOSE-clear, THROAT-clear & wnl. NECK:  Supple w/ fairROM; no JVD; normal carotid impulses w/o bruits; no thyromegaly or nodules palpated; no lymphadenopathy. CHEST:  Clear to P & A; without wheezes/ rales/ or rhonchi; he has an intermittent dry irritative cough... HEART:  Regular Rhythm; without murmurs/ rubs/ or gallops. ABDOMEN:  Obese, soft & nontender; normal bowel sounds; no organomegaly or masses detected. EXT: without deformities, mild arthritic changes; no varicose veins/ venous insuffic/ or edema. NEURO:  CN's intact; motor testing normal; no focal neuro deficits DERM:  No lesions noted; no rash etc...  RADIOLOGY DATA:  Reviewed in the EPIC EMR & discussed w/ the patient...  LABORATORY DATA:  Reviewed in the EPIC EMR & discussed w/ the patient...   Assessment & Plan:    OSA, UARS>  He uses CPAP nightly, continue same...  Chronic Cough>  He is quite dependent on the cough syrup, limited to one pint per month; doesn't want to change meds but insurance won't fill the Phenergan expectorant; change to Hycodan- 1 tsp Q8h prn...  Atyp CWP>  We discussed rest the chest/ apply heat/ topical lidocaine patches/ etc;  We added BACLOFEN 10mg  tid for back spasms...   MEDICAL PROBLEMS FOLLOWED by Ronald King, DrFERARU >>   HBP>  Controlled on CCB & Diuretic now, off prev ACE and cough is improved...  DYSLIPIDEMIA>  On Crestor, Tricor + diet per DrPaz...   Obesity>  We reviewed diet & exercise but he has been noncompliant w/ both...  GI> GERD, IBS, Hems>  Stable on NexiumBid, etc...  GU> BPH, ED, Hypogonadism>  Followed by Lyn Hollingshead, DrKerr, & Urology...  DJD>  Followed by DrDean w/ prev left knee arthroscopy...  Vit D Defic>  On OTC Vit D supplementation & ok...  NEUROPATHY>  Followed by DrFeraru in [place] & intol to Neurontin/ Lyrica,  etc; on VICODIN 2.5-500 Tid per Neurology...  ANXIETY>  he still sees Psychologist, occupational, for his Paxil & Klonopin...   Patient's Medications  New Prescriptions   No medications on file  Previous Medications   AMLODIPINE (NORVASC) 10 MG TABLET    Take 1 tablet (10 mg total) by mouth daily.   ASCORBIC ACID (VITAMIN C) 500 MG TABLET    Take 1,000 mg by mouth daily.    ASPIRIN 81 MG TABLET    Take 81 mg by mouth daily.     BACLOFEN (LIORESAL) 10 MG TABLET    Take 1 tablet (10 mg total) by mouth 3 (three) times daily.   CALCIUM CARBONATE-VITAMIN D (RA CALCIUM PLUS VITAMIN D) 600-400 MG-UNIT PER TABLET    Take 1 tablet by mouth daily.     CHOLECALCIFEROL (VITAMIN D) 2000 UNITS TABLET    Take 2,000 Units by mouth daily.     CLONAZEPAM (KLONOPIN) 1 MG TABLET    Take 1 mg by mouth 2 (two) times daily as needed.     COENZYME Q10 (CO Q 10 PO)    Take 1 tablet by mouth daily.    CYANOCOBALAMIN (B-12) 2000 MCG TABS    Take 1 tablet by mouth daily.   ESOMEPRAZOLE (NEXIUM) 40 MG CAPSULE    take 1 capsule by mouth  twice a day 30 MINUTES BEFORE MEALS   FENOFIBRATE (TRICOR) 145 MG TABLET    Take 1 tablet (145 mg total) by mouth daily.   HYDROCODONE-ACETAMINOPHEN (VICODIN) 2.5-500 MG PER TABLET    Take 0.5 tablets by mouth 2 (two) times daily as needed.    MULTIPLE VITAMINS-MINERALS (CENTRUM SILVER PO)    Take 1 tablet by mouth daily. Reported on 03/17/2015   PAROXETINE (PAXIL) 20 MG TABLET    Take 20 mg by mouth daily.   PYRIDOXINE HCL (VITAMIN B-6 CR) 200 MG TBCR    Take 1 tablet by mouth daily.     ROSUVASTATIN (CRESTOR) 20 MG TABLET    Take 1 tablet (20 mg total) by mouth every other day.   TESTOSTERONE CYPIONATE (DEPOTESTOTERONE CYPIONATE) 200 MG/ML INJECTION    Inject into the muscle every 21 ( twenty-one) days.   Modified Medications   Modified Medication Previous Medication   AMOXICILLIN (AMOXIL) 500 MG TABLET amoxicillin (AMOXIL) 500 MG tablet      Take 1 tablet (500 mg total) by mouth 3  (three) times daily. As directed    Take 1 tablet (500 mg total) by mouth 3 (three) times daily.   HYDROCODONE-HOMATROPINE (HYCODAN) 5-1.5 MG/5ML SYRUP HYDROcodone-homatropine (HYCODAN) 5-1.5 MG/5ML syrup      Take 5 mLs by mouth every 8 (eight) hours as needed for cough. DO NOT FILL BEFORE 06/17/2016    Take 5 mLs by mouth every 6 (six) hours as needed for cough.  Discontinued Medications   No medications on file

## 2016-06-17 LAB — CBC AND DIFFERENTIAL
HCT: 44 % (ref 41–53)
HEMOGLOBIN: 15.3 g/dL (ref 13.5–17.5)
NEUTROS ABS: 5 /uL
PLATELETS: 240 10*3/uL (ref 150–399)
WBC: 7 10*3/mL

## 2016-06-17 LAB — HEPATIC FUNCTION PANEL
ALK PHOS: 51 U/L (ref 25–125)
ALT: 19 U/L (ref 10–40)
AST: 16 U/L (ref 14–40)
BILIRUBIN, TOTAL: 0.4 mg/dL

## 2016-06-17 LAB — BASIC METABOLIC PANEL
BUN: 14 mg/dL (ref 4–21)
CREATININE: 1.1 mg/dL (ref 0.6–1.3)
Glucose: 117 mg/dL
Potassium: 4.4 mmol/L (ref 3.4–5.3)
Sodium: 148 mmol/L — AB (ref 137–147)

## 2016-06-17 LAB — PSA: PSA: 3.68

## 2016-06-17 LAB — HEMOGLOBIN A1C: Hemoglobin A1C: 6.4

## 2016-06-24 ENCOUNTER — Encounter: Payer: Self-pay | Admitting: Internal Medicine

## 2016-07-22 ENCOUNTER — Telehealth: Payer: Self-pay | Admitting: Internal Medicine

## 2016-07-22 NOTE — Telephone Encounter (Signed)
Left pt message asking to call Allison back directly at [phone] to schedule AWV. Thanks! °

## 2016-07-26 NOTE — Telephone Encounter (Signed)
Scheduled 07/29/16 °

## 2016-07-28 NOTE — Progress Notes (Addendum)
Subjective:   Ronald King is a 71 y.o. male who presents for Medicare Annual/Subsequent preventive examination.  Review of Systems:  No ROS.  Medicare Wellness Visit.  Cardiac Risk Factors include: advanced age (>28men, >106 women);diabetes mellitus;dyslipidemia;obesity (BMI >30kg/m2)  Sleep patterns: gets up 0-2 times nightly to void and sleeps 7-8 hours nightly. Wears CPAP nightly. Home Safety/Smoke Alarms: Feels safe in home. Smoke alarms in place.   Living environment; residence and Firearm Safety: Lives w/ wife. 2-story house, firearms stored safely. Seat Belt Safety/Bike Helmet: Wears seat belt.   Counseling:   Eye Exam- Follows w/ [facility], cannot remember name of provider. Has upcoming appointment. Dental- Follow w/ dentist yearly, cannot remember name of provider.  Male:   CCS- last 08/30/08. Internal hemorrhoids, otherwise normal. 10 year recall.     PSA-  Lab Results  Component Value Date   PSA 3.68 06/17/2016   PSA 3.51 12/18/2015   PSA 2.55 05/30/2014      Objective:    Vitals: BP 136/70 (BP Location: Left Arm, Patient Position: Sitting, Cuff Size: Normal)   Pulse 70   Temp 98 F (36.7 C) (Oral)   Resp 14   Ht 6\' 4"  (1.93 m)   Wt 271 lb 2 oz (123 kg)   SpO2 96%   BMI 33.00 kg/m   Body mass index is 33 kg/m.  Tobacco History  Smoking Status  . Former Smoker  . Packs/day: 1.00  . Years: 3.00  . Quit date: 03/29/1965  Smokeless Tobacco  . Never Used     Counseling given: No   Past Medical History:  Diagnosis Date  . Anemia   . Anxiety   . Chronic bronchitis   . Chronic cough   . Diabetes ([place]) 07-2011   per Dr Buddy Duty  . DJD (degenerative joint disease)   . Dyslipidemia   . Esophageal polyp   . GERD (gastroesophageal reflux disease)    s/p [employer] F.  . Hemorrhoids, internal   . History of gastrointestinal hemorrhage   . Hypertension   . Hypogonadism, male    per Dr Buddy Duty  . Neuropathy    per neuro-Dr Ferrarou vicodin  .  OSA (obstructive sleep apnea)    on CPAP  . Vitamin D deficiency    Past Surgical History:  Procedure Laterality Date  . ANAL FISSURE REPAIR    . hand fracture surgery     left  . KNEE ARTHROSCOPY  2007   Dr. Marlou Sa  . LAPAROSCOPIC NISSEN FUNDOPLICATION  11/3233   Dr. Hassell Done  . NOSE SURGERY    . TONSILLECTOMY     Family History  Problem Relation Age of Onset  . Cerebral aneurysm Father   . Alcohol abuse Father   . Neuropathy Mother   . Heart disease Mother     died at age 22  . Migraines Brother     several  . Diabetes Maternal Uncle   . CAD Brother     CABG in his 54s  . Colon cancer Neg Hx   . Prostate cancer Neg Hx    History  Sexual Activity  . Sexual activity: Not on file    Outpatient Encounter Prescriptions as of 07/29/2016  Medication Sig  . amLODipine (NORVASC) 10 MG tablet Take 1 tablet (10 mg total) by mouth daily.  Marland Kitchen amoxicillin (AMOXIL) 500 MG tablet Take 1 tablet (500 mg total) by mouth 3 (three) times daily. As directed  . ascorbic acid (VITAMIN C) 500  MG tablet Take 1,000 mg by mouth daily.   Marland Kitchen aspirin 81 MG tablet Take 81 mg by mouth daily.    . Calcium Carbonate-Vitamin D (RA CALCIUM PLUS VITAMIN D) 600-400 MG-UNIT per tablet Take 1 tablet by mouth daily.    . Cholecalciferol (VITAMIN D) 2000 UNITS tablet Take 2,000 Units by mouth daily.    . clonazePAM (KLONOPIN) 1 MG tablet Take 1 mg by mouth 2 (two) times daily as needed.    . Coenzyme Q10 (CO Q 10 PO) Take 1 tablet by mouth daily.   . Cyanocobalamin (B-12) 2000 MCG TABS Take 1 tablet by mouth daily.  Marland Kitchen esomeprazole (NEXIUM) 40 MG capsule take 1 capsule by mouth twice a day 30 MINUTES BEFORE MEALS  . fenofibrate (TRICOR) 145 MG tablet Take 1 tablet (145 mg total) by mouth daily.  Marland Kitchen HYDROcodone-acetaminophen (VICODIN) 2.5-500 MG per tablet Take 0.5 tablets by mouth 2 (two) times daily as needed.   Marland Kitchen HYDROcodone-homatropine (HYCODAN) 5-1.5 MG/5ML syrup Take 5 mLs by mouth every 8 (eight) hours as needed  for cough. DO NOT FILL BEFORE 06/17/2016  . Multiple Vitamins-Minerals (CENTRUM SILVER PO) Take 1 tablet by mouth daily. Reported on 03/17/2015  . PARoxetine (PAXIL) 20 MG tablet Take 20 mg by mouth daily.  . Pyridoxine HCl (VITAMIN B-6 CR) 200 MG TBCR Take 1 tablet by mouth daily.    . rosuvastatin (CRESTOR) 20 MG tablet Take 1 tablet (20 mg total) by mouth every other day.  . testosterone cypionate (DEPOTESTOTERONE CYPIONATE) 200 MG/ML injection Inject into the muscle every 21 ( twenty-one) days.   . [DISCONTINUED] rosuvastatin (CRESTOR) 20 MG tablet Take 1 tablet (20 mg total) by mouth every other day.  . baclofen (LIORESAL) 10 MG tablet Take 1 tablet (10 mg total) by mouth 3 (three) times daily. (Patient not taking: Reported on 07/29/2016)   No facility-administered encounter medications on file as of 07/29/2016.     Activities of Daily Living In your present state of health, do you have any difficulty performing the following activities: 07/29/2016 09/24/2015  Hearing? N N  Vision? N N  Difficulty concentrating or making decisions? N N  Walking or climbing stairs? N N  Dressing or bathing? N N  Doing errands, shopping? N N  Preparing Food and eating ? N N  Using the Toilet? N N  In the past six months, have you accidently leaked urine? N Y  Do you have problems with loss of bowel control? N N  Managing your Medications? N N  Managing your Finances? N N  Housekeeping or managing your Housekeeping? N N  Some recent data might be hidden    Patient Care Team: Colon Branch, MD as PCP - General (Internal Medicine) Delrae Rend, MD as Consulting Physician (Endocrinology) Noralee Space, MD as Consulting Physician (Pulmonary Disease) Johny Shock. Doy Hutching, MD as Consulting Physician (Neurology)   Assessment:    Physical assessment deferred to PCP.  Exercise Activities and Dietary recommendations Current Exercise Habits: Structured exercise class, Type of exercise: strength training/weights,  Frequency (Times/Week): 3  Diet (meal preparation, eat out, water intake, caffeinated beverages, dairy products, fruits and vegetables): in general, a "healthy" diet  , well balanced, on average, 2-3 meals per day, diabetic. Likes vegetables, chicken. Occasional baked potato and steak. Tires to minimize sugar intake. Working on changing eating patterns to have larger meals during the day and a lighter meal in the evenings.  Goals    . Have 3 meals a  day    . Increase physical activity      Fall Risk Fall Risk  07/29/2016 09/24/2015 09/12/2014 07/09/2013 06/01/2012  Falls in the past year? No Yes No No No  Number falls in past yr: - 1 - - -  Injury with Fall? - No - - -   Depression Screen PHQ 2/9 Scores 07/29/2016 09/24/2015 09/12/2014 07/09/2013  PHQ - 2 Score 0 0 0 0    Cognitive Function MMSE - Mini Mental State Exam 09/24/2015  Orientation to time 5  Orientation to Place 5  Registration 3  Attention/ Calculation 5  Recall 3  Language- name 2 objects 2  Language- repeat 1  Language- follow 3 step command 3  Language- read & follow direction 1  Write a sentence 1  Copy design 1  Total score 30        Ad8 score reviewed for issues:  Issues making decisions: No  Less interest in hobbies / activities: No  Repeats questions, stories (family complaining): No  Trouble using ordinary gadgets (microwave, computer, phone): No  Forgets the month or year: No  Mismanaging finances: No  Remembering appts: No   Daily problems with thinking and/or memory: No Ad8 score is= 0  Immunization History  Administered Date(s) Administered  . Influenza Split 01/28/2011, 12/16/2011  . Influenza Whole 12/25/2008  . Influenza, High Dose Seasonal PF 01/09/2015  . Influenza,inj,Quad PF,36+ Mos 01/10/2014  . Influenza-Unspecified 11/27/2012  . Pneumococcal Conjugate-13 09/06/2013  . Pneumococcal Polysaccharide-23 12/16/2011  . Td 09/24/2015  . Tdap 03/29/2005  . Zoster 12/15/2012    Screening Tests Health Maintenance  Topic Date Due  . URINE MICROALBUMIN  09/13/1956  . OPHTHALMOLOGY EXAM  10/19/2016  . INFLUENZA VACCINE  10/27/2016  . HEMOGLOBIN A1C  12/18/2016  . FOOT EXAM  07/29/2017  . COLONOSCOPY  08/31/2018  . TETANUS/TDAP  09/23/2025  . Hepatitis C Screening  Completed  . PNA vac Low Risk Adult  Completed      Plan:    Follow-up w/ PCP as scheduled.  Bring a copy of your advance directives to your next office visit.  I have personally reviewed and noted the following in the patient's chart:   . Medical and social history . Use of alcohol, tobacco or illicit drugs  . Current medications and supplements . Functional ability and status . Nutritional status . Physical activity . Advanced directives . List of other physicians . Vitals . Screenings to include cognitive, depression, and falls . Referrals and appointments  In addition, I have reviewed and discussed with patient certain preventive protocols, quality metrics, and best practice recommendations. A written personalized care plan for preventive services as well as general preventive health recommendations were provided to patient.     Dorrene German, RN  07/29/2016  Kathlene November, MD

## 2016-07-29 ENCOUNTER — Ambulatory Visit (INDEPENDENT_AMBULATORY_CARE_PROVIDER_SITE_OTHER): Payer: Medicare Other | Admitting: Internal Medicine

## 2016-07-29 ENCOUNTER — Encounter: Payer: Self-pay | Admitting: Internal Medicine

## 2016-07-29 VITALS — BP 136/70 | HR 70 | Temp 98.0°F | Resp 14 | Ht 76.0 in | Wt 271.1 lb

## 2016-07-29 DIAGNOSIS — E7800 Pure hypercholesterolemia, unspecified: Secondary | ICD-10-CM

## 2016-07-29 DIAGNOSIS — E118 Type 2 diabetes mellitus with unspecified complications: Secondary | ICD-10-CM | POA: Diagnosis not present

## 2016-07-29 DIAGNOSIS — Z0000 Encounter for general adult medical examination without abnormal findings: Secondary | ICD-10-CM

## 2016-07-29 LAB — LIPID PANEL
Cholesterol: 164 mg/dL (ref 0–200)
HDL: 35 mg/dL — AB (ref >39.00)
LDL Cholesterol: 103 mg/dL — ABNORMAL HIGH (ref 0–99)
NONHDL: 128.65
Total CHOL/HDL Ratio: 5
Triglycerides: 127 mg/dL (ref 0.0–149.0)
VLDL: 25.4 mg/dL (ref 0.0–40.0)

## 2016-07-29 LAB — MICROALBUMIN / CREATININE URINE RATIO
CREATININE, U: 253.1 mg/dL
MICROALB UR: 2.4 mg/dL — AB (ref 0.0–1.9)
MICROALB/CREAT RATIO: 0.9 mg/g (ref 0.0–30.0)

## 2016-07-29 MED ORDER — ROSUVASTATIN CALCIUM 20 MG PO TABS: 20.0000 mg | ORAL_TABLET | ORAL | 3 refills | Status: DC

## 2016-07-29 NOTE — Assessment & Plan Note (Signed)
--  Td 2017  ; Pneumonia shot 2013; prevnar 2015; zostavax-- 11-2012; discussed shingrex, will think about it  --CCS: Cscope 08-2008, neg, next in 10 years, was rec hemocult q 2-3 years, Neg hemocult  09-2013 , 01-2015,02-2016 --PSA : elevated, sees urology, to have a recheck this month --rec to stay active and eat healthy

## 2016-07-29 NOTE — Progress Notes (Signed)
Subjective:    Patient ID: Ronald King, male    DOB: 11/25/1945, 71 y.o.   MRN: [id number]  DOS:  07/29/2016 Type of visit - description : CPX Interval history: in general feels well Good med compliance , on crestor qod, needs a RF    Review of Systems Denies dysphagia Cough at baseline No LUTs at this time    Other than above, a 14 point review of systems is negative    Past Medical History:  Diagnosis Date  . Anemia   . Anxiety   . Chronic bronchitis   . Chronic cough   . Diabetes ([place]) 07-2011   per Dr Buddy Duty  . DJD (degenerative joint disease)   . Dyslipidemia   . Esophageal polyp   . GERD (gastroesophageal reflux disease)    s/p [employer] F.  . Hemorrhoids, internal   . History of gastrointestinal hemorrhage   . Hypertension   . Hypogonadism, male    per Dr Buddy Duty  . Neuropathy    per neuro-Dr Ferrarou vicodin  . OSA (obstructive sleep apnea)    on CPAP  . Vitamin D deficiency     Past Surgical History:  Procedure Laterality Date  . ANAL FISSURE REPAIR    . hand fracture surgery     left  . KNEE ARTHROSCOPY  2007   Dr. Marlou Sa  . LAPAROSCOPIC NISSEN FUNDOPLICATION  11/5619   Dr. Hassell Done  . NOSE SURGERY    . TONSILLECTOMY      Social History   Social History  . Marital status: Married    Spouse name: N/A  . Number of children: 1  . Years of education: N/A   Occupational History  . Technical brewer, retired     retired  . 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 Smoking status: Former Smoker    Packs/day: 1.00    Years: 3.00    Quit date: 03/29/1965  . Smokeless tobacco: Never Used  . Alcohol use No  . Drug use: No  . Sexual activity: Not on file   Other Topics Concern  . Not on file   Social History Narrative   Lives w/ wife     Family History  Problem Relation Age of Onset  . Cerebral aneurysm Father   . Alcohol abuse Father   . Neuropathy Mother   . Heart disease Mother     died at age  20  . Migraines Brother     several  . Diabetes Maternal Uncle   . CAD Brother     CABG in his 2s  . Colon cancer Neg Hx   . Prostate cancer Neg Hx      Allergies as of 07/29/2016      Reactions   Montelukast Shortness Of Breath   Cefuroxime Axetil    REACTION: unsure of reaction   Gabapentin Other (See Comments)   Drowsiness    Montelukast Sodium    REACTION: unsure of reaction   Pregabalin Other (See Comments)   Drowsiness      Medication List       Accurate as of 07/29/16  2:24 PM. Always use your most recent med list.          amLODipine 10 MG tablet Commonly known as:  NORVASC Take 1 tablet (10 mg total) by mouth daily.   amoxicillin 500 MG tablet Commonly known as:  AMOXIL Take 1 tablet (500 mg  total) by mouth 3 (three) times daily. As directed   ascorbic acid 500 MG tablet Commonly known as:  VITAMIN C Take 1,000 mg by mouth daily.   aspirin 81 MG tablet Take 81 mg by mouth daily.   B-12 2000 MCG Tabs Take 1 tablet by mouth daily.   baclofen 10 MG tablet Commonly known as:  LIORESAL Take 1 tablet (10 mg total) by mouth 3 (three) times daily.   CENTRUM SILVER PO Take 1 tablet by mouth daily. Reported on 03/17/2015   clonazePAM 1 MG tablet Commonly known as:  KLONOPIN Take 1 mg by mouth 2 (two) times daily as needed.   CO Q 10 PO Take 1 tablet by mouth daily.   esomeprazole 40 MG capsule Commonly known as:  NEXIUM take 1 capsule by mouth twice a day 30 MINUTES BEFORE MEALS   fenofibrate 145 MG tablet Commonly known as:  TRICOR Take 1 tablet (145 mg total) by mouth daily.   HYDROcodone-acetaminophen 2.5-500 MG tablet Commonly known as:  VICODIN Take 0.5 tablets by mouth 2 (two) times daily as needed.   HYDROcodone-homatropine 5-1.5 MG/5ML syrup Commonly known as:  HYCODAN Take 5 mLs by mouth every 8 (eight) hours as needed for cough. DO NOT FILL BEFORE 06/17/2016   PARoxetine 20 MG tablet Commonly known as:  PAXIL Take 20 mg by mouth  daily.   Pyridoxine HCl 200 MG Tbcr Take 1 tablet by mouth daily.   RA CALCIUM PLUS VITAMIN D 600-400 MG-UNIT tablet Generic drug:  Calcium Carbonate-Vitamin D Take 1 tablet by mouth daily.   rosuvastatin 20 MG tablet Commonly known as:  CRESTOR Take 1 tablet (20 mg total) by mouth every other day.   testosterone cypionate 200 MG/ML injection Commonly known as:  DEPOTESTOSTERONE CYPIONATE Inject into the muscle every 21 ( twenty-one) days.   Vitamin D 2000 units tablet Take 2,000 Units by mouth daily.          Objective:   Physical Exam BP 136/70 (BP Location: Left Arm, Patient Position: Sitting, Cuff Size: Normal)   Pulse 70   Temp 98 F (36.7 C) (Oral)   Resp 14   Ht 6\' 4"  (1.93 m)   Wt 271 lb 2 oz (123 kg)   SpO2 96%   BMI 33.00 kg/m   General:   Well developed, well nourished . NAD.  Neck: No  thyromegaly  HEENT:  Normocephalic . Face symmetric, atraumatic Lungs:  CTA B Normal respiratory effort, no intercostal retractions, no accessory muscle use. Heart: RRR,  no murmur.  No pretibial edema bilaterally  DIABETIC FEET EXAM: Normal pedal pulses bilaterally Skin dry, few calluses Pinprick examination of the feet : Decrease sensation in a patchy fashion distally Abdomen:  Not distended, soft, non-tender. No rebound or rigidity.   Skin: Exposed areas without rash. Not pale. Not jaundice Neurologic:  alert & oriented X3.  Speech normal, gait appropriate for age and unassisted Strength symmetric and appropriate for age.  Psych: Cognition and judgment appear intact.  Cooperative with normal attention span and concentration.  Behavior appropriate. No anxious or depressed appearing.    Assessment & Plan:    Assessment  DM, Dr. Buddy Duty HTN Dislipidemia Hypogonadism, Dr. Buddy Duty + Neuropathy , Dr Tonia Ghent, on Vicodin  Obesity, BMI 33 Anxiety -- paxil- clonazepam (per Dr Tye Savoy) Vitamin D deficiency PULM:  --OSA, CPAP --Chronic cough-- Dr Lenna Gilford DJD,  Dr. Marlou Sa GU:  BPH, ED GI: --GERD, status post Nissen fundoplication --Esophageal polyp -- h/o GI bleed-  tranfussion (90s)  PLAN: DM: Per endo, feet exam today consistent with neuropathy. Feet care discussed. Check a micro HTN: Last CMP satisfactory, continue amlodipine Increased PSA: Sees urology, to have a recheck in few days, they are considering a biopsy High cholesterol: On Crestor every other day and fenofibrate. Checking labs. Recent cerumen Impaction: Minimal wax on the right ear, recommend peroxide. RTC 6 months

## 2016-07-29 NOTE — Patient Instructions (Addendum)
GO TO THE LAB : Get the blood work     GO TO THE FRONT DESK Schedule your next appointment for a  routine checkup in 6 months  Bring a copy of your advance directives to your next office visit.  Ronald King , Thank you for taking time to come for your Medicare Wellness Visit. I appreciate your ongoing commitment to your health goals. Please review the following plan we discussed and let me know if I can assist you in the future.   These are the goals we discussed: Goals    . Have 3 meals a day    . Increase physical activity       This is a list of the screening recommended for you and due dates:  Health Maintenance  Topic Date Due  . Urine Protein Check  09/13/1955  . Eye exam for diabetics  10/19/2016  . Flu Shot  10/27/2016  . Hemoglobin A1C  12/18/2016  . Complete foot exam   07/29/2017  . Colon Cancer Screening  08/31/2018  . Tetanus Vaccine  09/23/2025  .  Hepatitis C: One time screening is recommended by Center for Disease Control  (CDC) for  adults born from [birth year] through 1964.   Completed  . Pneumonia vaccines  Completed     Diabetes and Foot Care Diabetes may cause you to have problems because of poor blood supply (circulation) to your feet and legs. This may cause the skin on your feet to become thinner, break easier, and heal more slowly. Your skin may become dry, and the skin may peel and crack. You may also have nerve damage in your legs and feet causing decreased feeling in them. You may not notice minor injuries to your feet that could lead to infections or more serious problems. Taking care of your feet is one of the most important things you can do for yourself. Follow these instructions at home:  Wear shoes at all times, even in the house. Do not go barefoot. Bare feet are easily injured.  Check your feet daily for blisters, cuts, and redness. If you cannot see the bottom of your feet, use a mirror or ask someone for help.  Wash your feet with warm water  (do not use hot water) and mild soap. Then pat your feet and the areas between your toes until they are completely dry. Do not soak your feet as this can dry your skin.  Apply a moisturizing lotion or petroleum jelly (that does not contain alcohol and is unscented) to the skin on your feet and to dry, brittle toenails. Do not apply lotion between your toes.  Trim your toenails straight across. Do not dig under them or around the cuticle. File the edges of your nails with an emery board or nail file.  Do not cut corns or calluses or try to remove them with medicine.  Wear clean socks or stockings every day. Make sure they are not too tight. Do not wear knee-high stockings since they may decrease blood flow to your legs.  Wear shoes that fit properly and have enough cushioning. To break in new shoes, wear them for just a few hours a day. This prevents you from injuring your feet. Always look in your shoes before you put them on to be sure there are no objects inside.  Do not cross your legs. This may decrease the blood flow to your feet.  If you find a minor scrape, cut, or break in the  skin on your feet, keep it and the skin around it clean and dry. These areas may be cleansed with mild soap and water. Do not cleanse the area with peroxide, alcohol, or iodine.  When you remove an adhesive bandage, be sure not to damage the skin around it.  If you have a wound, look at it several times a day to make sure it is healing.  Do not use heating pads or hot water bottles. They may burn your skin. If you have lost feeling in your feet or legs, you may not know it is happening until it is too late.  Make sure your health care provider performs a complete foot exam at least annually or more often if you have foot problems. Report any cuts, sores, or bruises to your health care provider immediately. Contact a health care provider if:  You have an injury that is not healing.  You have cuts or breaks in  the skin.  You have an ingrown nail.  You notice redness on your legs or feet.  You feel burning or tingling in your legs or feet.  You have pain or cramps in your legs and feet.  Your legs or feet are numb.  Your feet always feel cold. Get help right away if:  There is increasing redness, swelling, or pain in or around a wound.  There is a red line that goes up your leg.  Pus is coming from a wound.  You develop a fever or as directed by your health care provider.  You notice a bad smell coming from an ulcer or wound. This information is not intended to replace advice given to you by your health care provider. Make sure you discuss any questions you have with your health care provider. Document Released: 03/12/2000 Document Revised: 08/21/2015 Document Reviewed: 08/22/2012 Elsevier Interactive Patient Education  2017 [place] 65 Years and Older, Male Preventive care refers to lifestyle choices and visits with your health care provider that can promote health and wellness. What does preventive care include?  A yearly physical exam. This is also called an annual well check.  Dental exams once or twice a year.  Routine eye exams. Ask your health care provider how often you should have your eyes checked.  Personal lifestyle choices, including:  Daily care of your teeth and gums.  Regular physical activity.  Eating a healthy diet.  Avoiding tobacco and drug use.  Limiting alcohol use.  Practicing safe sex.  Taking low doses of aspirin every day.  Taking vitamin and mineral supplements as recommended by your health care provider. What happens during an annual well check? The services and screenings done by your health care provider during your annual well check will depend on your age, overall health, lifestyle risk factors, and family history of disease. Counseling  Your health care provider may ask you questions about your:  Alcohol  use.  Tobacco use.  Drug use.  Emotional well-being.  Home and relationship well-being.  Sexual activity.  Eating habits.  History of falls.  Memory and ability to understand (cognition).  Work and work Statistician. Screening  You may have the following tests or measurements:  Height, weight, and BMI.  Blood pressure.  Lipid and cholesterol levels. These may be checked every 5 years, or more frequently if you are over 100 years old.  Skin check.  Lung cancer screening. You may have this screening every year starting at age 94 if you  have a 30-pack-year history of smoking and currently smoke or have quit within the past 15 years.  Fecal occult blood test (FOBT) of the stool. You may have this test every year starting at age 67.  Flexible sigmoidoscopy or colonoscopy. You may have a sigmoidoscopy every 5 years or a colonoscopy every 10 years starting at age 68.  Prostate cancer screening. Recommendations will vary depending on your family history and other risks.  Hepatitis C blood test.  Hepatitis B blood test.  Sexually transmitted disease (STD) testing.  Diabetes screening. This is done by checking your blood sugar (glucose) after you have not eaten for a while (fasting). You may have this done every 1-3 years.  Abdominal aortic aneurysm (AAA) screening. You may need this if you are a current or former smoker.  Osteoporosis. You may be screened starting at age 34 if you are at high risk. Talk with your health care provider about your test results, treatment options, and if necessary, the need for more tests. Vaccines  Your health care provider may recommend certain vaccines, such as:  Influenza vaccine. This is recommended every year.  Tetanus, diphtheria, and acellular pertussis (Tdap, Td) vaccine. You may need a Td booster every 10 years.  Varicella vaccine. You may need this if you have not been vaccinated.  Zoster vaccine. You may need this after age  75.  Measles, mumps, and rubella (MMR) vaccine. You may need at least one dose of MMR if you were born in 1957 or later. You may also need a second dose.  Pneumococcal 13-valent conjugate (PCV13) vaccine. One dose is recommended after age 23.  Pneumococcal polysaccharide (PPSV23) vaccine. One dose is recommended after age 24.  Meningococcal vaccine. You may need this if you have certain conditions.  Hepatitis A vaccine. You may need this if you have certain conditions or if you travel or work in places where you may be exposed to hepatitis A.  Hepatitis B vaccine. You may need this if you have certain conditions or if you travel or work in places where you may be exposed to hepatitis B.  Haemophilus influenzae type b (Hib) vaccine. You may need this if you have certain risk factors. Talk to your health care provider about which screenings and vaccines you need and how often you need them. This information is not intended to replace advice given to you by your health care provider. Make sure you discuss any questions you have with your health care provider. Document Released: 04/11/2015 Document Revised: 12/03/2015 Document Reviewed: 01/14/2015 Elsevier Interactive Patient Education  2017 [employer].

## 2016-07-29 NOTE — Assessment & Plan Note (Signed)
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 Feet care discussed. Check a micro HTN: Last CMP satisfactory, continue amlodipine Increased PSA: Sees urology, to have a recheck in few days, they are considering a biopsy High cholesterol: On Crestor every other day and fenofibrate. Checking labs. Recent cerumen Impaction: Minimal wax on the right ear, recommend peroxide. RTC 6 months

## 2016-08-11 LAB — PSA: PSA: 3.09

## 2016-08-24 ENCOUNTER — Encounter: Payer: Self-pay | Admitting: Internal Medicine

## 2016-08-31 ENCOUNTER — Other Ambulatory Visit: Payer: Self-pay | Admitting: Internal Medicine

## 2016-08-31 ENCOUNTER — Other Ambulatory Visit: Payer: Self-pay | Admitting: Pulmonary Disease

## 2016-10-19 ENCOUNTER — Other Ambulatory Visit: Payer: Self-pay | Admitting: Pulmonary Disease

## 2016-10-21 ENCOUNTER — Telehealth: Payer: Self-pay | Admitting: Pulmonary Disease

## 2016-10-21 MED ORDER — AMLODIPINE BESYLATE 10 MG PO TABS: 10.0000 mg | ORAL_TABLET | Freq: Every day | ORAL | 3 refills | Status: DC

## 2016-10-21 NOTE — Telephone Encounter (Signed)
Spoke with pt. He is needing refills on Hycodan and Norvasc. Norvasc has been sent in. Hycodan was last refilled on 06/10/16 for #473mL.  SN - please advise on refill. Thanks.

## 2016-10-22 MED ORDER — HYDROCODONE-HOMATROPINE 5-1.5 MG/5ML PO SYRP: 5.0000 mL | ORAL_SOLUTION | Freq: Three times a day (TID) | ORAL | 0 refills | Status: DC | PRN

## 2016-10-22 NOTE — Telephone Encounter (Signed)
Per SN: need to get Norvasc from PCP Dr Larose Kells.  Okay to fill the Hycodan (gen) limited to #462mL every month, 1tsp po every 8 hours as needed for cough.  Called spoke with patient and advised on SN's recs as stated above.  Pt voiced his understanding Rx for Hycodan signed by SN and placed up front in the brown accordion folder Nothing further needed; will sign off

## 2016-12-09 ENCOUNTER — Encounter: Payer: Medicare Other | Admitting: Internal Medicine

## 2016-12-16 ENCOUNTER — Ambulatory Visit (INDEPENDENT_AMBULATORY_CARE_PROVIDER_SITE_OTHER)
Admission: RE | Admit: 2016-12-16 | Discharge: 2016-12-16 | Disposition: A | Discharge status: Home / Self Care | Payer: Medicare Other | Source: Ambulatory Visit | Attending: Pulmonary Disease | Admitting: Pulmonary Disease

## 2016-12-16 ENCOUNTER — Encounter: Payer: Self-pay | Admitting: Pulmonary Disease

## 2016-12-16 ENCOUNTER — Encounter: Payer: Self-pay | Admitting: Gastroenterology

## 2016-12-16 ENCOUNTER — Ambulatory Visit (INDEPENDENT_AMBULATORY_CARE_PROVIDER_SITE_OTHER): Payer: Medicare Other | Admitting: Pulmonary Disease

## 2016-12-16 VITALS — BP 130/74 | HR 72 | Temp 98.2°F | Ht 74.0 in | Wt 281.2 lb

## 2016-12-16 DIAGNOSIS — K219 Gastro-esophageal reflux disease without esophagitis: Secondary | ICD-10-CM | POA: Diagnosis not present

## 2016-12-16 DIAGNOSIS — M159 Polyosteoarthritis, unspecified: Secondary | ICD-10-CM

## 2016-12-16 DIAGNOSIS — I10 Essential (primary) hypertension: Secondary | ICD-10-CM

## 2016-12-16 DIAGNOSIS — R1310 Dysphagia, unspecified: Secondary | ICD-10-CM

## 2016-12-16 DIAGNOSIS — R053 Chronic cough: Secondary | ICD-10-CM

## 2016-12-16 DIAGNOSIS — R05 Cough: Secondary | ICD-10-CM | POA: Diagnosis not present

## 2016-12-16 DIAGNOSIS — Z6835 Body mass index (BMI) 35.0-35.9, adult: Secondary | ICD-10-CM

## 2016-12-16 DIAGNOSIS — G629 Polyneuropathy, unspecified: Secondary | ICD-10-CM

## 2016-12-16 DIAGNOSIS — R0789 Other chest pain: Secondary | ICD-10-CM

## 2016-12-16 DIAGNOSIS — G4733 Obstructive sleep apnea (adult) (pediatric): Secondary | ICD-10-CM | POA: Diagnosis not present

## 2016-12-16 DIAGNOSIS — M150 Primary generalized (osteo)arthritis: Secondary | ICD-10-CM

## 2016-12-16 MED ORDER — HYDROCODONE-HOMATROPINE 5-1.5 MG/5ML PO SYRP: 5.0000 mL | ORAL_SOLUTION | Freq: Three times a day (TID) | ORAL | 0 refills | Status: DC | PRN

## 2016-12-16 NOTE — Progress Notes (Signed)
Subjective:    Patient ID: Ronald King, male    DOB: [birth year], 71 y.o.   MRN: [id number]  HPI 72 y/o WM here for a follow up visit... he has a chronic cough x years and is dependent on codeine containing cough syrups... he was miserable when ENDAL HD went off the market and we had to substitute PHENERGAN EXPECTORANT w/ CODEINE - 1 tsp by mouth Q6H as needed for cough, limiting him to 1 pint per month; then his insurance company refused the Phenergan Expect & switched to [facility]... His new Primary Care doctor is DrPaz. ~  SEE PREV EPIC NOTES FOR OLDER DATA >>     PFT 11/2011 were essentially wnl w/ FVC=3.98 (76%), FEV1=3.46 (86%), %1sec=87, mid-flows are wnl at 112% predicted...   July 09, 2013:  65mo ROV and Ronald King has established primary care w/ DrPaz> here today for Pulm follow up & refill of his cough syrup...  CXR 4/15 showed norm heart size, clear lungs, NAD.Marland Kitchen.   ~  July 11, 2014:  23mo ROV & Ronald King reports a stable interval- his CC is left hip pain & DrPaz has rec Ortho eval (currently pending); His breathing is OK, chr cough w/o change, occurs in paroxysms, and he denies recent bronchitic infections etc;  He notes some SOB/DOE w/ yard work, but exercises in the gym 3d/wk ([employer]) & he keeps up well... He is followed by DrPaz for Primary Care, DrKerr for Endocrine, DrFeraru for Neurology, and DrSanders for Psyche...     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    Chronic Cough> managed for yrs w/ cough syrup, now Hycodan 1pt per month- 1tsp Q6h prn cough; cough remains dry- he denies sput, hemoptysis, SOB, CP, etc...    Hx upper airway resistance syndrome> prev eval by DrClance- last seen 7/13- responded very well to CPAP despite a fairly neg Sleep Study in 1999; he continues on CPAP nightly & resting well, excellent daytime alertness, etc...    GERD> on Nexium40Bid +  vigorous antireflux regimen (elev HOB 6", npo after dinner, etc); we reviewed LPR problems; he had Nissen fundoplication in 7322, last EGD was 2010 by DrDBrodie- no acute changes...    Obese> his weight is up 3# to 280 lbs now- BMI= 36 and we reviewed diet, exercise, wt reduction strategies...  We reviewed prob list, meds, xrays and labs> see below for updates >> meds refilled per request...   LABS 3/16 by DrPaz> FLP- at goals;  Chems- ok x BS=121, A1c=6.3;  CBC- wnl...  CXR 06/2104 showed borderline heart size, clear lungs, NAD- DJD in Tspine...  (I personally reviewed the images in Epic)   ~  January 16, 2015:  91mo ROV & Ronald King states "my lungs are acting up this month" c/o incr cough, chest congestion, but denies sput/ CP/ reflux or burning; he denies SOB & states his chr DOE is stable, no change; He wants his hydrocodone cough syrup refilled;  He notes that he was getting tired w/ DOE at [place] started on Testos shots w/ improvement & feeling good now he says...    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    Chronic Cough> managed for yrs w/ cough syrup, now Hycodan 1pt per month- 1tsp  Q6h prn cough; cough remains dry- he denies sput, hemoptysis, SOB, CP, etc...    Hx upper airway resistance syndrome> prev eval by DrClance- last seen 7/13- responded very well to CPAP despite a fairly neg Sleep Study in 1999; he continues on CPAP nightly & resting well, excellent daytime alertness, etc...    GERD> on Nexium40Bid + vigorous antireflux regimen (elev HOB 6", npo after dinner, etc); we reviewed LPR problems; he had Nissen fundoplication in 1062, last EGD was 2010 by DrDBrodie- no acute changes...    Obese> his weight is down 3# to 277 lbs now- BMI= 36 and we reviewed diet, exercise, wt reduction strategies...     Hyperlipidemia>  On Crestor20 and Tricor145 => note Feno145 was started in 2010 when  TG was >400 & Cres20 added by DrPaz in 2014; suggest that he stop the Fenofibrate at this time.    Low-T>  He is on Depo Testos 100mg  Q2wks per DrKerr, Endocrine (seen 11/2014 w/ Low-T, prediabetes, low bone mass & low VitD- note reviewed)    Dysthymia>  On Paxil-CR 25 and Klonopin 1mg  Bid... We reviewed prob list, meds, xrays and labs> see below for updates >> meds refilled per request, his PCP is DrPaz... IMP/PLAN>>  We discussed adding the MUCINEX 1200mg  bid + fluids;  We refilled his HYCODAN 1pt- 1tsp Q6H prn cough...  ~  Aug 07, 2015:  35mo ROV & Ronald King was seen by TP 05/26/15 for his OSA/upper airway resistance syndrome on CPAP16 x yrs w/ prev eval by DrClance (last seen 2013);  He needed a new CPAP machine & wanted the Dream machine=> this was arranged via [facility] & he is quite satisfied;  He has returned on this occais for a CPAP download to meet medicare criteria for on-going payment for his CPAP;  He is resting well, wakes refreshed in the AM 7 denies sleep pressure during the day, energy is satis, no daytime hypersomnolence, etc...  CPAP download required him to bring his machine into the office and it provides the following data for April-May2017> used 30/30 days, ave usage 8H/d, AHI=5.5 on his CPAP16, he did have 16% in leak & is working w/ a new mask at present...  EXAM shows Afeb, VSS, O2sat=94% on RA at rest; Wt=276#, 6'2"Tall, BMI=35;  HEENT- neg- mallampati3;  Chest- decr BS bilat, clear w/o w/r/r;  Heart- RR w/o m/r/g;  Abd- obese, soft, nontender;  Ext- neg w/o c/c/e;  Neuro- intact... IMP/PLAN>>  Ronald King is stable on his CPAP 16 & doing well w/ his new machine, good compliance, tolerated well; Rec to continue same...   ~  December 11, 2015>  55mo ROV & Ronald King is +-stable, c/o rib pain (recently on the right), chr cough as before, "spasms and pain" goes across his back; "My breathing doesn't want to quit hurting", notes bilat pain in ribs c/w an AtypCWP... 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    Chronic Cough> managed for yrs w/ cough syrup, now Hycodan 1pt per month- 1tsp Q6h prn cough; cough remains dry- he denies sput, hemoptysis, SOB, etc...    Hx upper airway resistance syndrome> prev eval by DrClance- last seen 7/13- responded very well to CPAP despite a fairly neg Sleep Study in 1999; he continues on CPAP nightly & resting well,  excellent daytime alertness, etc; we arranged a new machine for him 08/2015 via [facility] & new mask has helped decr the amt of leak...    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 We discussed Rx for this CWP w/ rest/ heat/ lidocaine patches/ & we will add BACLOFEN 10mg  Tid; I indicated next step is prob Pain clinic referral...    Medical issues> followed by DrPaz & DrKerr EXAM shows Afeb, VSS, O2sat=96% on RA at rest; Wt=277#, 6'2"Tall, BMI=35;  HEENT- neg- mallampati3;  Chest- decr BS bilat, clear w/o w/r/r;  Heart- RR w/o m/r/g;  Abd- obese, soft, nontender;  Ext- neg w/o c/c/e;  Neuro- intact...  CXR 12/11/15>  Norm heart size, atherosclerotic changes in Ao, clear lungs- NAD, mod thoracic spondylosis... IMP/PLAN>>  Despite his mult somatic complaints Ronald King is reasonable stable w/ his chr cough, chr sinus, UARS, and diffuse aches and pains;  We discussed Rx w/ rest/ heat to the chest wall/ topical lidocaine patches/ and he will continue his current meds from DrPaz, DrKerr, & DrGibson; he can use MUCINEX OTC for any phlegm, and we are adding BACLOFEN 10mg  tid for the CWP & back spasms; CXR is clear; Amox & Hycodan refilled per his request, he [facility]...   ~  June 10, 2016:  41m ROV & Ronald King tells me that he has had several episodes of bronchitis over the last few months "I take Amox for a few days and it eases up" but I told him to take the whole course of Ab for 7-10 days to knock it out... Currently notes mild cough (esp Qhs), dry- no phlegm, no hemoptysis, chr stable SOB/DOE, still at 276# and says he's exercising 3x/wk (states that he lost to 260#, then regained 15# in 1 day!);      Hx AR, chr sinusitis, chr cough> on Hycodan prn & lim to 1pt/mo; he has Amox500 to keep on hand & uses prn; no pos cultures or pathogens identified...    UARS> on CPAP Qhs, APS DME, no issues w/ mask/ pressure/ says he uses it regularly, rests well, wakes refreshed & no daytime hypersomnolence...    HBP, Chr CWP> AtypCWP on Amlod10, BP= 150/70, we reviewed diet/ exercise/ lose wt...     PCP=> DrPaz & last seen 05/27/16-- noted that pt self discontinued his HCT; followed for HBP, HL, DM, Obesity, BPH w/ elev PSA, etc...    Endocrine=> DrKerr followed for pre-DM, Low-T & last seen 12/18/15-- BS=120, A1c=6.3, PSA=3.51, CBC- ok w/ Hg=15.8, Cr=1.16, Testos=373 on shots q2wks...     Neuro=> DrFeraru in [place]-- seen 12/12/15 for leg pain, migraines, LBP, periph neuropathy; she writes for [employer], he has had MRI of Cspine & Lspine & her notes are reviewed... EXAM shows Afeb, VSS, O2sat=96% on RA at rest; Wt=276#, 6'2"Tall, BMI=35;  HEENT- neg- mallampati3;  Chest- decr BS bilat, clear w/o w/r/r;  Heart- RR w/o m/r/g;  Abd- obese, soft, nontender;  Ext- neg w/o c/c/e;  Neuro- intact... IMP/PLAN>>  Shamir is overall stable from the pulmonary standpoint; we refilled his Hycodan for 1 pt / month and refilled the Amoxicillin that he insists that he keep on had; he will call for problems & we plan rov recheck 31mo...   ~  December 16, 2016:  34mo ROV & Ronald King returns to get his Hycodan cough syrup and Amoxicillin prescriptions refilled>  He notes that his cough has increased, worse  esp at nights and after eating; he has intermittent beige sput production, no hemoptysis; he notesDOE w/ exertion- eg mowing but he goes to the gym 3d/wk, CC is "my knees";  He has hx Nissen fundoplication 3220 w/ several f/u EGDs by DrDBrodie;  DrPaz is his PCP=> We reviewed the following interval Epic notes, follow up visits >>     He saw ENDOCRINE-DrKerr on 06/17/16>  Followed for prediabetes, hypogonadism, low bone mass; on VitD 2000u/d, Testos200mg /ml- taking 140mg  IM Q3wks, and "diet"; Testos was low at 106, BS=117, A1c=6.4, PSA=3.68, Hg=15.3, Cr=1.14...     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    He saw NEURO-DrFeraru, [place], [place] on 07/26/16>  Hx neuropathy & migraines; also LBP/ neck pain; on Norco5 which she writes for pt; continues to follow Q88mo...    He saw PCP-DrPaz 07/29/16>  Medicare wellness visit, 19 page note reviewed... 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    Chronic Cough> managed for yrs w/ cough syrup, now Hycodan 1pt per month- 1tsp Q6h prn cough; cough remains dry- he denies sput, hemoptysis, SOB, etc...    Hx upper airway resistance syndrome on CPAP> prev eval by DrClance- last seen 7/13- responded very well to CPAP despite a fairly neg Sleep Study in 1999; he continues on CPAP nightly & resting well, excellent daytime alertness, etc; we arranged a new machine for him 08/2015 via [facility] & new mask has helped decr the amt of leak...    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 We discussed Rx for this CWP w/ rest/  heat/ lidocaine patches/ & we will add BACLOFEN 10mg  Tid; I indicated next step is prob Pain clinic referral...    Medical issues> followed by DrPaz & DrKerr EXAM shows Afeb, VSS, O2sat=94% on RA at rest; Wt=281#, 6'2"Tall, BMI=35-6;  HEENT- neg- mallampati3;  Chest- decr BS bilat, clear w/o w/r/r;  Heart- RR w/o m/r/g;  Abd- obese, soft, nontender;  Ext- neg w/o c/c/e;  Neuro- intact...  CXR 12/16/16 (independently reviewed by me in the PACS system) showed norm heart size, clear lungs- NAD, sl pleural thickening, DJD Tspine...  IMP/PLAN>>  Ronald King remains stable from the pulm standpoint, he requests refills of his Hycodan (lim to 1pt per month) and Amoxicillin; his history is more suggestive of a reflux component & he is on Nexium40Bid, NPO after dinner, elev HOB 6", and we will refer back to GI for f/u GI eval...            Problem List:       Hx of SINUSITIS >> prev on NASONEX 2spBid + he requests Rx for AMOXICILLIN to keep on hand for travel... prev eval DrByers- "he wanted to do surg"... "my sinuses are crazy" but he refuses to consider the surg... also had dysphonia eval at [facility] in the [facility]'s w/ Botox  tried.  OBSTRUCTIVE SLEEP APNEA, UPPER AIRWAY RESISTANCE SYNDROME>> he uses CPAP nightly... saw DrClance 12/09- he felt upper airway resistance syndrome... doing better w/ new CPAP machine in 2010. ~  7/13:  He had f/u DrClance & doing well, no changes made, continue same CPAP... ~  He remains stable on the CPAP, rests satis, no daytime sleepiness issues, etc... ~  08/2015> we contacted [facility] regarding new machine 7 to work w/ him regarding mask fit...  COUGH, CHRONIC >> as above... he had an extensive eval in the past w/ GI, ENT, Pulm, etc... he used ENDAL-HD, then  PHEN EXPECT w/ CODEINE, now HYCODAN 1tsp Q6h prn cough, lim to 1pt/mo (Med choice lim by his insurance). ~  Cough was no diff off ACE/ ARB in past & he does not want to change meds... ~  CXR 1/10 showed clear,  just min biapical  pleural thickening... ~  2/11: states intol to Symbicort w/ HAs... try ADVAIR 250Bid... ~  CXR 2/11 showed clear lungs, NAD.Marland Kitchen. ~  CXR 9/12 showed chr bronchitic changes, NAD... ~  PFT 9/13 showed FVC=3.98 (76%), FEV1=3.46 (86%), %1sec=87, mid-flows=112% predicted...  ~  CXR 9/13 showed normal heart size, clear lungs, DJD spine... ~  4/15: his insurance company is requesting change from Phenergan expect w/ cod- we will write for Hycodan one tsp Q6h prn & continue to limit to 1pt/mo... ~  CXR 4/15 showed norm heart size, clear lungs, NAD...  ~  10/15: his insurance has again changed the cough syrup, this time from Phenergan expect w/ cod to [facility]- same rx 1tsp Q6h prn cough, lim to 1pt/mo... 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  ~  CXR 12/11/15>  Norm heart size, atherosclerotic changes in Ao, clear lungs- NAD, mod thoracic spondylosis   FOLLOWED FOR PRIMARY CARE BY DrPAZ >>   HYPERTENSION (ICD-401.9) - meds adjusted by DrCrenshaw on NORVASC 10mg /d & HCTZ 25mg - 1/2 tab daily...  ~  8/12:  BP= 134/82 & denies HA, fatigue, visual changes, CP, palipit, dizziness, syncope, dyspnea, edema, etc... ~  3/13:  BP= 124/76 & he denies CP, palpit, SOB, edema... ~  9/13:  BP= 110/66 & he remains asymptomatic... ~  He has established Primary Care f/u w/ DrPaz => on Amlod10, Hct12.5, & off prev Lisin; BP= 122/64 today.  DYSLIPIDEMIA (ICD-272.4) - on TRICOR 145mg /d but refuses Statin meds or Lipid Clinic referral... ~  FLP 1/10 showed TChol 161, TG 239, HDL 30, LDL 72... he wants to control w/ diet. ~  FLP 9/10 showed TChol 192, TG 426, HDL 30, LDL 80... rec> start [id number]. ~  [place] 2/11 showed TChol 173, TG 102, HDL 37, LDL 115... continue Tricor Rx + diet. ~  FLP 2/12 showed TChol 221, TG 249, HDL 31, LDL 140... Reminded to take med regularly & get wt down. ~  FLP 8/12 on Tric145 showed TChol 194, TG 169, HDL 32, LDL 128... Still not at goal, refuses statin med. ~   [place] 3/13 on Tric145 showed TChol 229, TG 177, HDL 35, LDL 140... rec to start CRESTOR20 + better low fat diet! ~  FLP 5/13 on Tric145+Cres20 showed TChol 151, TG 188, HDL 33, LDL 80... Continue same. ~  Lipids followed by DrPaz, now on Cres20 monotherapy => labs reviewed in EPIC...  BORDERLINE DM >> Dx by Lyn Hollingshead, treated w/ diet alone & labs 9/13 showed BS=91, A1c=6.1  OBESITY (ICD-278.00) - weight = 274#,  6\' 2"   tall = BMI of 35... discussed diet + exercise and the need for weight reduction both from his overall health status and his OSA... "I moderate what I eat". ~  weight 1/10 = 267# ~  weight 2/11 = 272#... discussed again! ~  Weight 2/12 = 276#... He is unconcerned! ~  Weight 8/12 = 274#... Reviewed diet & exercise needed. ~  Weight 3/13 = 274#... He is NOT trying. ~  Weight 9/13 = 272# ~  Weight 3/14 = 274# ~  Weight 10/14 = 280#  GERD (ICD-530.81) - on NEXIUM 40mg Bid & off the prev hs Reglan... s/p lap nissen 3/01 by DrMartin.... ~  EGD 11/05 showed prev Nissen, +gastritis... ~  EGD 6/10 by DrDBrodie showed functioning Nissen, post-op changes, sessile polyp in distal esoph= benign mucosa.  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 ~  f/u colonoscopy 6/10 by DrDBrodie showed negative x for int hems...  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  Finally started ANDROGEL 5gm packet Qd in 2012 ~  labs 1/10 showed PSA= 1.63 ~  labs 2/11 showed PSA= 1.65,  ~  Labs 1/12 showed Testosterone level = 160-205 ~  Labs 2/12 showed PSA = 1.63, Testosterone level = 553 ~  Labs 8/12 showed Testosterone level = 428... rec continue daily Androgel. ~  Labs 3/13 showed PSA= 1.73, Testosterone level = 185 & states he is using the Androgel Q3rd day, rec to incr to daily. ~  Follow up labs & med adjustments per DrPaz => he referred pt to Endocrine, DrKerr... 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  VITAMIN D DEFICIENCY (ICD-268.9) - on Vit D OTC 2000 u daily... ~  labs 1/10 showed Vit D level = 16... Vit D 2000 u daily started. ~  labs 2/11 showed Vit D level = 34... continue supplement. ~  Labs 8/12 showed Vit D level = 47... continue same.  HEADACHE (ICD-784.0) - Chr daily migraines in the past w/ prev HA eval by DrFreeman (Botox shots helped in the past)...  NEUROPATHY (ICD-355.9) - c/o burning in legs and eval from Neuro in [facility] & Rx w/ Neurontin + Hydrocodone for pain... this may be part of a familial trait as several relatives have severe periph neuropathy (?hereditary syndrome)- we don't have notes from [facility] neurology. ~  Pt reports that he was intol to Neurontin, Lyrica, & one other ("memory gaps") & he refused Cymbalta trial... ~  3/13:  Reviewed w/ pt> he continues on VICODIN 2.5-500 from DrGibson up to Tid, 90/mo w/ refills from him... ~  Now followed by [facility], DrFeraru & DrGibson on Vits and Hydrocodone 2.5mg  Bid as needed...   ANXIETY (ICD-300.00) - on PAXIL CR 25mg /d & KLONOPIN 1mg  Bid as needed... they were prev perscribed by ?psychiatrist? but he notes "I've been on these for >89yrs"... he still sees DrSanders for these prescriptions...   Past Surgical History:  Procedure Laterality Date  . ANAL FISSURE REPAIR    . hand fracture surgery     left  . KNEE ARTHROSCOPY  2007   Dr. Marlou Sa  . LAPAROSCOPIC NISSEN FUNDOPLICATION  09/8293   Dr. Hassell Done  . NOSE SURGERY    . TONSILLECTOMY      Outpatient Encounter Prescriptions  as of 12/16/2016  Medication Sig  . amLODipine (NORVASC) 10 MG tablet Take 1 tablet (10 mg total) by mouth daily.  Marland Kitchen amoxicillin (AMOXIL) 500 MG tablet Take 1 tablet (500 mg total) by mouth 3 (three) times daily. As directed  . ascorbic acid  (VITAMIN C) 500 MG tablet Take 1,000 mg by mouth daily.   Marland Kitchen aspirin 81 MG tablet Take 81 mg by mouth daily.    . baclofen (LIORESAL) 10 MG tablet Take 1 tablet (10 mg total) by mouth 3 (three) times daily.  . Calcium Carbonate-Vitamin D (RA CALCIUM PLUS VITAMIN D) 600-400 MG-UNIT per tablet Take 1 tablet by mouth daily.    . Cholecalciferol (VITAMIN D) 2000 UNITS tablet Take 2,000 Units by mouth daily.    . clonazePAM (KLONOPIN) 1 MG tablet Take 1 mg by mouth 2 (two) times daily as needed.    . Coenzyme Q10 (CO Q 10 PO) Take 1 tablet by mouth daily.   . Cyanocobalamin (B-12) 2000 MCG TABS Take 1 tablet by mouth daily.  Marland Kitchen esomeprazole (NEXIUM) 40 MG capsule take 1 capsule by mouth twice a day 30 MINUTES BEFORE MEALS  . fenofibrate (TRICOR) 145 MG tablet Take 1 tablet (145 mg total) by mouth daily.  Marland Kitchen HYDROcodone-acetaminophen (VICODIN) 2.5-500 MG per tablet Take 0.5 tablets by mouth 2 (two) times daily as needed.   Marland Kitchen HYDROcodone-homatropine (HYCODAN) 5-1.5 MG/5ML syrup Take 5 mLs by mouth every 8 (eight) hours as needed for cough.  . Multiple Vitamins-Minerals (CENTRUM SILVER PO) Take 1 tablet by mouth daily. Reported on 03/17/2015  . PARoxetine (PAXIL) 20 MG tablet Take 20 mg by mouth daily.  Marland Kitchen pyridOXINE (VITAMIN B-6) 100 MG tablet Take 100 mg by mouth daily.  . rosuvastatin (CRESTOR) 20 MG tablet Take 1 tablet (20 mg total) by mouth every other day.  . testosterone cypionate (DEPOTESTOTERONE CYPIONATE) 200 MG/ML injection Inject into the muscle every 21 ( twenty-one) days.   . [DISCONTINUED] HYDROcodone-homatropine (HYCODAN) 5-1.5 MG/5ML syrup Take 5 mLs by mouth every 8 (eight) hours as needed for cough.  . [DISCONTINUED] Pyridoxine HCl (VITAMIN B-6 CR) 200 MG TBCR Take 1 tablet by mouth daily.     No facility-administered encounter medications on file as of 12/16/2016.     Allergies  Allergen Reactions  . Montelukast Shortness Of Breath  . Cefuroxime Axetil     REACTION: unsure of  reaction  . Gabapentin Other (See Comments)    Drowsiness   . Montelukast Sodium     REACTION: unsure of reaction  . Pregabalin Other (See Comments)    Drowsiness    Immunization History  Administered Date(s) Administered  . Influenza Split 01/28/2011, 12/16/2011  . Influenza Whole 12/25/2008  . Influenza, High Dose Seasonal PF 01/09/2015, 12/13/2016  . Influenza,inj,Quad PF,6+ Mos 01/10/2014  . Influenza-Unspecified 11/27/2012  . Pneumococcal Conjugate-13 09/06/2013  . Pneumococcal Polysaccharide-23 12/16/2011  . Td 09/24/2015  . Tdap 03/29/2005  . Zoster 12/15/2012  he [facility] the [facility] FLU [facility]...   Current Medications, Allergies, Past Medical History, Past Surgical History, Family History, and Social History were reviewed in [employer] record.   Review of Systems        See HPI - all other systems neg except as noted...  The patient complains of cough, & dyspnea on exertion.  The patient denies anorexia, fever, weight loss, weight gain, vision loss, decreased hearing, hoarseness, chest pain, syncope, peripheral edema, headaches, hemoptysis, abdominal pain, melena, hematochezia, severe indigestion/heartburn,  hematuria, incontinence, muscle weakness, suspicious skin lesions, transient blindness, difficulty walking, depression, unusual weight change, abnormal bleeding, enlarged lymph nodes, and angioedema.     Objective:   Physical Exam      WD, Obese, 71 y/o WM in NAD... GENERAL:  Alert & oriented; pleasant & cooperative... HEENT:  Ronald King, EOM-wnl, PERRLA, EACs-clear, TMs-wnl, NOSE-clear, THROAT-clear & wnl. NECK:  Supple w/ fairROM; no JVD; normal carotid impulses w/o bruits; no thyromegaly or nodules palpated; no lymphadenopathy. CHEST:  Clear to P & A; without wheezes/ rales/ or rhonchi; he has an intermittent dry irritative cough... HEART:  Regular Rhythm; without murmurs/ rubs/ or gallops. ABDOMEN:  Obese, soft & nontender; normal  bowel sounds; no organomegaly or masses detected. EXT: without deformities, mild arthritic changes; no varicose veins/ venous insuffic/ or edema. NEURO:  CN's intact; motor testing normal; no focal neuro deficits DERM:  No lesions noted; no rash etc...  RADIOLOGY DATA:  Reviewed in the EPIC EMR & discussed w/ the patient...  LABORATORY DATA:  Reviewed in the EPIC EMR & discussed w/ the patient...   Assessment & Plan:   12/16/16>   Ronald King remains stable from the pulm standpoint, he requests refills of his Hycodan (lim to 1pt per month) and Amoxicillin; his history is more suggestive of a reflux component & he is on Nexium40Bid, NPO after dinner, elev HOB 6", and we will refer back to GI for f/u GI eval  OSA, UARS>  He uses CPAP nightly, continue same...  Chronic Cough>  He is quite dependent on the cough syrup, limited to one pint per month; doesn't want to change meds but insurance won't fill the Phenergan expectorant; change to Hycodan- 1 tsp Q8h prn...  Atyp CWP>  We discussed rest the chest/ apply heat/ topical lidocaine patches/ etc;  We added BACLOFEN 10mg  tid for back spasms...   MEDICAL PROBLEMS FOLLOWED by Dahlia Bailiff, DrFERARU >>   HBP>  Controlled on CCB & Diuretic now, off prev ACE and cough is improved...  DYSLIPIDEMIA>  On Crestor, Tricor + diet per DrPaz...   Obesity>  We reviewed diet & exercise but he has been noncompliant w/ both...  GI> GERD, IBS, Hems>  Stable on NexiumBid, etc...  GU> BPH, ED, Hypogonadism>  Followed by Lyn Hollingshead, DrKerr, & Urology...  DJD>  Followed by DrDean w/ prev left knee arthroscopy...  Vit D Defic>  On OTC Vit D supplementation & ok...  NEUROPATHY>  Followed by DrFeraru in [place] & intol to Neurontin/ Lyrica, etc; on VICODIN 2.5-500 Tid per Neurology...  ANXIETY>  he still sees Psychologist, occupational, for his Paxil & Klonopin...   Patient's Medications  New Prescriptions   No medications on file  Previous Medications   AMLODIPINE  (NORVASC) 10 MG TABLET    Take 1 tablet (10 mg total) by mouth daily.   AMOXICILLIN (AMOXIL) 500 MG TABLET    Take 1 tablet (500 mg total) by mouth 3 (three) times daily. As directed   ASCORBIC ACID (VITAMIN C) 500 MG TABLET    Take 1,000 mg by mouth daily.    ASPIRIN 81 MG TABLET    Take 81 mg by mouth daily.     BACLOFEN (LIORESAL) 10 MG TABLET    Take 1 tablet (10 mg total) by mouth 3 (three) times daily.   CALCIUM CARBONATE-VITAMIN D (RA CALCIUM PLUS VITAMIN D) 600-400 MG-UNIT PER TABLET    Take 1 tablet by mouth daily.     CHOLECALCIFEROL (VITAMIN D) 2000 UNITS TABLET  Take 2,000 Units by mouth daily.     CLONAZEPAM (KLONOPIN) 1 MG TABLET    Take 1 mg by mouth 2 (two) times daily as needed.     COENZYME Q10 (CO Q 10 PO)    Take 1 tablet by mouth daily.    CYANOCOBALAMIN (B-12) 2000 MCG TABS    Take 1 tablet by mouth daily.   ESOMEPRAZOLE (NEXIUM) 40 MG CAPSULE    take 1 capsule by mouth twice a day 30 MINUTES BEFORE MEALS   FENOFIBRATE (TRICOR) 145 MG TABLET    Take 1 tablet (145 mg total) by mouth daily.   HYDROCODONE-ACETAMINOPHEN (VICODIN) 2.5-500 MG PER TABLET    Take 0.5 tablets by mouth 2 (two) times daily as needed.    MULTIPLE VITAMINS-MINERALS (CENTRUM SILVER PO)    Take 1 tablet by mouth daily. Reported on 03/17/2015   PAROXETINE (PAXIL) 20 MG TABLET    Take 20 mg by mouth daily.   PYRIDOXINE (VITAMIN B-6) 100 MG TABLET    Take 100 mg by mouth daily.   ROSUVASTATIN (CRESTOR) 20 MG TABLET    Take 1 tablet (20 mg total) by mouth every other day.   TESTOSTERONE CYPIONATE (DEPOTESTOTERONE CYPIONATE) 200 MG/ML INJECTION    Inject into the muscle every 21 ( twenty-one) days.   Modified Medications   Modified Medication Previous Medication   HYDROCODONE-HOMATROPINE (HYCODAN) 5-1.5 MG/5ML SYRUP HYDROcodone-homatropine (HYCODAN) 5-1.5 MG/5ML syrup      Take 5 mLs by mouth every 8 (eight) hours as needed for cough.    Take 5 mLs by mouth every 8 (eight) hours as needed for cough.   Discontinued Medications   PYRIDOXINE HCL (VITAMIN B-6 CR) 200 MG TBCR    Take 1 tablet by mouth daily.

## 2016-12-16 NOTE — Patient Instructions (Signed)
Today we updated your med list in our EPIC system...    Continue your current medications the same...  We refilled your [facility] per request...  Today we did a follow up CXR...    We will contact you w/ the results when available...   We will arrange for a GI consultation to check into your reflux, cough, & swallowing issues...  Call for any questions...  Let's plan a follow up visit in 34mo, sooner if needed for problems.Marland KitchenMarland Kitchen

## 2016-12-30 LAB — BASIC METABOLIC PANEL
BUN: 16 (ref 4–21)
CREATININE: 1.1 (ref 0.6–1.3)
Glucose: 103
Potassium: 4.6 (ref 3.4–5.3)
SODIUM: 144 (ref 137–147)

## 2016-12-30 LAB — CBC AND DIFFERENTIAL
HEMATOCRIT: 45 (ref 41–53)
HEMOGLOBIN: 15.7 (ref 13.5–17.5)
PLATELETS: 217 (ref 150–399)
WBC: 6.8

## 2016-12-30 LAB — HEPATIC FUNCTION PANEL
ALK PHOS: 46 (ref 25–125)
ALT: 19 (ref 10–40)
AST: 19 (ref 14–40)
Bilirubin, Total: 0.5

## 2017-01-07 ENCOUNTER — Encounter: Payer: Self-pay | Admitting: Internal Medicine

## 2017-02-10 ENCOUNTER — Ambulatory Visit: Payer: Medicare Other | Admitting: Gastroenterology

## 2017-02-10 ENCOUNTER — Encounter: Payer: Self-pay | Admitting: Gastroenterology

## 2017-02-10 VITALS — BP 126/68 | HR 88 | Ht 71.5 in | Wt 275.4 lb

## 2017-02-10 DIAGNOSIS — R05 Cough: Secondary | ICD-10-CM | POA: Diagnosis not present

## 2017-02-10 DIAGNOSIS — Z9889 Other specified postprocedural states: Secondary | ICD-10-CM | POA: Diagnosis not present

## 2017-02-10 DIAGNOSIS — K219 Gastro-esophageal reflux disease without esophagitis: Secondary | ICD-10-CM

## 2017-02-10 DIAGNOSIS — R059 Cough, unspecified: Secondary | ICD-10-CM

## 2017-02-10 MED ORDER — LANSOPRAZOLE 30 MG PO TBDP: 30.0000 mg | ORAL_TABLET | Freq: Every day | ORAL | 3 refills | Status: DC

## 2017-02-10 NOTE — Progress Notes (Signed)
Ronald King    [id number]    03-22-1946  Primary Care Physician:Paz, Alda Berthold, MD  Referring Physician: Colon Branch, MD [address] [place], [place] [address]  Chief complaint: GERD, cough HPI: 71 year old male with history of chronic GERD status post Nissen fundoplication in March 7169, on Nexium 40 mg twice daily, chronic cough here with complaints of worsening of reflux. He is feeling more gas coming up . He is taking additional tums about once or twice a week .  He feels heartburn and reflux symptoms are progressively getting worse.  He was doing well on Prevacid in the past  and then brand name Nexium but generic esomeprazole does not seem to be helping much  . No dysphagia or done aphasia.  He has chronic intermittent cough, is on hydrocodone for cough suppression.  He has history of sleep apnea, uses CPAP.  Denies any nausea, vomiting, abdominal pain, melena or bright red blood per rectum     Outpatient Encounter Medications as of 02/10/2017  Medication Sig  . amLODipine (NORVASC) 10 MG tablet Take 1 tablet (10 mg total) by mouth daily.  Marland Kitchen amoxicillin (AMOXIL) 500 MG tablet Take 1 tablet (500 mg total) by mouth 3 (three) times daily. As directed  . ascorbic acid (VITAMIN C) 500 MG tablet Take 1,000 mg by mouth daily.   Marland Kitchen aspirin 81 MG tablet Take 81 mg by mouth daily.    . baclofen (LIORESAL) 10 MG tablet Take 1 tablet (10 mg total) by mouth 3 (three) times daily.  . Calcium Carbonate-Vitamin D (RA CALCIUM PLUS VITAMIN D) 600-400 MG-UNIT per tablet Take 1 tablet by mouth daily.    . Cholecalciferol (VITAMIN D) 2000 UNITS tablet Take 2,000 Units by mouth daily.    . clonazePAM (KLONOPIN) 1 MG tablet Take 1 mg by mouth 2 (two) times daily as needed.    . Coenzyme Q10 (CO Q 10 PO) Take 1 tablet by mouth daily.   . Cyanocobalamin (B-12) 2000 MCG TABS Take 1 tablet by mouth daily.  Marland Kitchen esomeprazole (NEXIUM) 40 MG capsule take 1 capsule by mouth twice  a day 30 MINUTES BEFORE MEALS  . fenofibrate (TRICOR) 145 MG tablet Take 1 tablet (145 mg total) by mouth daily.  Marland Kitchen HYDROcodone-acetaminophen (VICODIN) 2.5-500 MG per tablet Take 0.5 tablets by mouth 2 (two) times daily as needed.   Marland Kitchen HYDROcodone-homatropine (HYCODAN) 5-1.5 MG/5ML syrup Take 5 mLs by mouth every 8 (eight) hours as needed for cough.  . Multiple Vitamins-Minerals (CENTRUM SILVER PO) Take 1 tablet by mouth daily. Reported on 03/17/2015  . PARoxetine (PAXIL) 20 MG tablet Take 20 mg by mouth daily.  Marland Kitchen pyridOXINE (VITAMIN B-6) 100 MG tablet Take 100 mg by mouth daily.  . rosuvastatin (CRESTOR) 20 MG tablet Take 1 tablet (20 mg total) by mouth every other day.  . testosterone cypionate (DEPOTESTOTERONE CYPIONATE) 200 MG/ML injection Inject into the muscle every 21 ( twenty-one) days.    No facility-administered encounter medications on file as of 02/10/2017.     Allergies as of 02/10/2017 - Review Complete 12/16/2016  Allergen Reaction Noted  . Montelukast Shortness Of Breath 12/10/2015  . Cefuroxime axetil    . Gabapentin Other (See Comments) 12/10/2015  . Montelukast sodium    . Pregabalin Other (See Comments) 12/10/2015    Past Medical History:  Diagnosis Date  . Anemia   . Anxiety   . Chronic bronchitis   .  Chronic cough   . Diabetes ([place]) 07-2011   per Dr Buddy Duty  . DJD (degenerative joint disease)   . Dyslipidemia   . Esophageal polyp   . GERD (gastroesophageal reflux disease)    s/p [employer] F.  . Hemorrhoids, internal   . History of gastrointestinal hemorrhage   . Hypertension   . Hypogonadism, male    per Dr Buddy Duty  . Neuropathy    per neuro-Dr Ferrarou vicodin  . OSA (obstructive sleep apnea)    on CPAP  . Vitamin D deficiency     Past Surgical History:  Procedure Laterality Date  . ANAL FISSURE REPAIR    . hand fracture surgery     left  . KNEE ARTHROSCOPY  2007   Dr. Marlou Sa  . LAPAROSCOPIC NISSEN FUNDOPLICATION  08/9627   Dr. Hassell Done  . NOSE SURGERY     . TONSILLECTOMY      Family History  Problem Relation Age of Onset  . Cerebral aneurysm Father   . Alcohol abuse Father   . Neuropathy Mother   . Heart disease Mother        died at age 48  . Migraines Brother        several  . Diabetes Maternal Uncle   . CAD Brother        CABG in his 4s  . Colon cancer Neg Hx   . Prostate cancer Neg Hx     Social History   Socioeconomic History  . Marital status: Married    Spouse name: Not on file  . Number of children: 1  . Years of education: Not on file  . Highest education level: Not on file  Social Needs  . Financial resource strain: Not on file  . Food insecurity - worry: Not on file  . Food insecurity - inability: Not on file  . Transportation needs - medical: Not on file  . Transportation needs - non-medical: Not on file  Occupational History  . Occupation: Technical brewer, retired    Comment: retired  . Occupation: minister    Comment: [employer], [employer]  Tobacco Use  . Smoking status: Former Smoker    Packs/day: 1.00    Years: 3.00    Pack years: 3.00    Last attempt to quit: 03/29/1965    Years since quitting: 51.9  . Smokeless tobacco: Never Used  Substance and Sexual Activity  . Alcohol use: No  . Drug use: No  . Sexual activity: Not on file  Other Topics Concern  . Not on file  Social History Narrative   Lives w/ wife      Review of systems: Review of Systems  Constitutional: Negative for fever and chills.  HENT: Positive for allery, sinus problems   Eyes: Negative for blurred vision.  Respiratory: Negative for shortness of breath and wheezing.  Positive for cough Cardiovascular: Negative for chest pain and palpitations.  Gastrointestinal: as per HPI Genitourinary: Negative for dysuria, urgency, frequency and hematuria.  Musculoskeletal: Positive for myalgias, back pain and joint pain.  Skin: Negative for itching and rash.  Neurological: Negative for dizziness, tremors, focal  weakness, seizures and loss of consciousness.  Endo/Heme/Allergies: Positive for seasonal allergies.  Psychiatric/Behavioral: Negative for depression, suicidal ideas and hallucinations.  All other systems reviewed and are negative.   Physical Exam: Vitals:   02/10/17 0939  BP: 126/68  Pulse: 88   Body mass index is 37.87 kg/m. Gen:      No acute distress  HEENT:  EOMI, sclera anicteric Neck:     No masses; no thyromegaly Lungs:    Clear to auscultation bilaterally; normal respiratory effort CV:         Regular rate and rhythm; no murmurs Abd:      + bowel sounds; soft, non-tender; no palpable masses, no distension Ext:    No edema; adequate peripheral perfusion Skin:      Warm and dry; no rash Neuro: alert and oriented x 3 Psych: normal mood and affect  Data Reviewed:  Reviewed labs, radiology imaging, old records and pertinent past GI work up EGD November 2005: Mild gastritis and evidence of postop changes with Nissen fundoplication with intact wrap  EGD 08/2008: Status post Nissen fundoplication, removed small nodule in distal esophagus was normal benign mucosa on pathology report Colonoscopy December 1999: Internal hemorrhoids otherwise normal Colonoscopy 08/2008: Normal  Assessment and Plan/Recommendations:  71 year old male with history of chronic GERD status post Nissen fundoplication 6384 here with complaints of progressive worsening and recurrence of reflux symptoms We will schedule for an EGD to evaluate, exclude esophagitis, slipped or loose Nissen wrap. We will send prescription for Prevacid 30 mg twice daily, 30 minutes before breakfast and dinner Continue antireflux measures  Chronic cough is followed by Dr. Leeanne Deed  Colorectal cancer screening: Average risk Due for recall colonoscopy June 2020  The risks and benefits as well as alternatives of endoscopic procedure(s) have been discussed and reviewed. All questions answered. The patient agrees to proceed.   Damaris Hippo , MD [phone] Mon-Fri 8a-5p [phone] after 5p, weekends, holidays  CC: Colon Branch, MD

## 2017-02-10 NOTE — Patient Instructions (Addendum)
You have been scheduled for an endoscopy. Please follow written instructions given to you at your visit today. If you use inhalers (even only as needed), please bring them with you on the day of your procedure.  Your physician has requested that you go to www.startemmi.com and enter the access code given to you at your visit today. This web site gives a general overview about your procedure. However, you should still follow specific instructions given to you by our office regarding your preparation for the procedure.  Normal BMI (Body Mass Index- based on height and weight) is between 23 and 30. Your BMI today is Body mass index is 37.87 kg/m. Marland Kitchen Please consider follow up  regarding your BMI with your Primary Care Provider.

## 2017-02-14 ENCOUNTER — Encounter: Payer: Self-pay | Admitting: Gastroenterology

## 2017-02-23 ENCOUNTER — Encounter: Payer: Self-pay | Admitting: Gastroenterology

## 2017-02-23 ENCOUNTER — Ambulatory Visit (AMBULATORY_SURGERY_CENTER): Payer: Medicare Other | Admitting: Gastroenterology

## 2017-02-23 ENCOUNTER — Other Ambulatory Visit: Payer: Self-pay

## 2017-02-23 VITALS — BP 114/60 | HR 69 | Temp 98.4°F | Resp 13 | Ht 71.5 in | Wt 275.0 lb

## 2017-02-23 DIAGNOSIS — K219 Gastro-esophageal reflux disease without esophagitis: Secondary | ICD-10-CM | POA: Diagnosis present

## 2017-02-23 MED ORDER — SODIUM CHLORIDE 0.9 % IV SOLN: 500.0000 mL | INTRAVENOUS | Status: DC

## 2017-02-23 NOTE — Op Note (Signed)
[place] Patient Name: Ronald King Procedure Date: 02/23/2017 1:33 PM MRN: [id number] Endoscopist: Mauri Pole , MD Age: 71 Referring MD:  Date of Birth: 12-12-1945 Gender: Male Account #: [id number] Procedure:                Upper GI endoscopy Indications:              Epigastric abdominal pain, Esophageal reflux                            symptoms that recur despite appropriate therapy Medicines:                Monitored Anesthesia Care Procedure:                Pre-Anesthesia Assessment:                           - Prior to the procedure, a History and Physical                            was performed, and patient medications and                            allergies were reviewed. The patient's tolerance of                            previous anesthesia was also reviewed. The risks                            and benefits of the procedure and the sedation                            options and risks were discussed with the patient.                            All questions were answered, and informed consent                            was obtained. Prior Anticoagulants: The patient has                            taken no previous anticoagulant or antiplatelet                            agents. ASA Grade Assessment: II - A patient with                            mild systemic disease. After reviewing the risks                            and benefits, the patient was deemed in                            satisfactory condition to undergo the procedure.  After obtaining informed consent, the endoscope was                            passed under direct vision. Throughout the                            procedure, the patient's blood pressure, pulse, and                            oxygen saturations were monitored continuously. The                            Endoscope was introduced through the mouth, and   advanced to the second part of duodenum. The upper                            GI endoscopy was accomplished without difficulty.                            The patient tolerated the procedure well. Scope In: Scope Out: Findings:                 The examined esophagus was normal. Regular Z-line                            at 45cm                           Evidence of a Nissen fundoplication was found at                            the gastroesophageal junction. The wrap appeared                            intact. This was traversed.                           The stomach was normal.                           The examined duodenum was normal. Complications:            No immediate complications. Estimated blood loss:                            None. Estimated Blood Loss:     Estimated blood loss: none. Impression:               - Normal esophagus.                           - A Nissen fundoplication was found. The wrap                            appears intact.                           - Normal  stomach.                           - Normal examined duodenum.                           - No specimens collected. Recommendation:           - Patient has a contact number available for                            emergencies. The signs and symptoms of potential                            delayed complications were discussed with the                            patient. Return to normal activities tomorrow.                            Written discharge instructions were provided to the                            patient.                           - Resume previous diet.                           - Continue present medications.                           - Return to GI clinic PRN. Mauri Pole, MD 02/23/2017 1:48:03 PM This report has been signed electronically.

## 2017-02-23 NOTE — Progress Notes (Signed)
No problems noted in the recovery room. maw 

## 2017-02-23 NOTE — Patient Instructions (Signed)
YOU HAD AN ENDOSCOPIC PROCEDURE TODAY AT [facility]:   Refer to the procedure report that was given to you for any specific questions about what was found during the examination.  If the procedure report does not answer your questions, please call your gastroenterologist to clarify.  If you requested that your care partner not be given the details of your procedure findings, then the procedure report has been included in a sealed envelope for you to review at your convenience later.  YOU SHOULD EXPECT: Some feelings of bloating in the abdomen. Passage of more gas than usual.  Walking can help get rid of the air that was put into your GI tract during the procedure and reduce the bloating. If you had a lower endoscopy (such as a colonoscopy or flexible sigmoidoscopy) you may notice spotting of blood in your stool or on the toilet paper. If you underwent a bowel prep for your procedure, you may not have a normal bowel movement for a few days.  Please Note:  You might notice some irritation and congestion in your nose or some drainage.  This is from the oxygen used during your procedure.  There is no need for concern and it should clear up in a day or so.  SYMPTOMS TO REPORT IMMEDIATELY:    Following upper endoscopy (EGD)  Vomiting of blood or coffee ground material  New chest pain or pain under the shoulder blades  Painful or persistently difficult swallowing  New shortness of breath  Fever of 100F or higher  Black, tarry-looking stools  For urgent or emergent issues, a gastroenterologist can be reached at any hour by calling [phone].   DIET:  We do recommend a small meal at first, but then you may proceed to your regular diet.  Drink plenty of fluids but you should avoid alcoholic beverages for 24 hours.  ACTIVITY:  You should plan to take it easy for the rest of today and you should NOT DRIVE or use heavy machinery until tomorrow (because of the sedation medicines used  during the test).    FOLLOW UP: Our staff will call the number listed on your records the next business day following your procedure to check on you and address any questions or concerns that you may have regarding the information given to you following your procedure. If we do not reach you, we will leave a message.  However, if you are feeling well and you are not experiencing any problems, there is no need to return our call.  We will assume that you have returned to your regular daily activities without incident.  If any biopsies were taken you will be contacted by phone or by letter within the next 1-3 weeks.  Please call us at [phone] if you have not heard about the biopsies in 3 weeks.    SIGNATURES/CONFIDENTIALITY: You and/or your care partner have signed paperwork which will be entered into your electronic medical record.  These signatures attest to the fact that that the information above on your After Visit Summary has been reviewed and is understood.  Full responsibility of the confidentiality of this discharge information lies with you and/or your care-partner.    You may resume your current medications today. Return to GI clinic as needed. Please call if any questions or concerns.

## 2017-02-24 ENCOUNTER — Telehealth: Payer: Self-pay | Admitting: *Deleted

## 2017-02-24 NOTE — Telephone Encounter (Signed)
  Follow up Call-  Call back number 02/23/2017  Post procedure Call Back phone  # [phone]  Permission to leave phone message Yes  Some recent data might be hidden     Patient questions:  Do you have a fever, pain , or abdominal swelling? No. Pain Score  0 *  Have you tolerated food without any problems? Yes.    Have you been able to return to your normal activities? Yes.    Do you have any questions about your discharge instructions: Diet   No. Medications  No. Follow up visit  No.  Do you have questions or concerns about your Care? No.  Actions: * If pain score is 4 or above: No action needed, pain <4.

## 2017-05-16 ENCOUNTER — Encounter: Payer: Self-pay | Admitting: Gastroenterology

## 2017-05-17 ENCOUNTER — Other Ambulatory Visit: Payer: Self-pay

## 2017-05-17 MED ORDER — LANSOPRAZOLE 30 MG PO CPDR: 30.0000 mg | DELAYED_RELEASE_CAPSULE | Freq: Every day | ORAL | 3 refills | Status: DC

## 2017-05-17 MED ORDER — LANSOPRAZOLE 30 MG PO CPDR: 30.0000 mg | DELAYED_RELEASE_CAPSULE | Freq: Two times a day (BID) | ORAL | 3 refills | Status: DC

## 2017-06-09 ENCOUNTER — Encounter: Payer: Self-pay | Admitting: Pulmonary Disease

## 2017-06-09 ENCOUNTER — Other Ambulatory Visit: Payer: Self-pay | Admitting: Internal Medicine

## 2017-06-09 ENCOUNTER — Ambulatory Visit: Payer: Medicare Other | Admitting: Pulmonary Disease

## 2017-06-09 VITALS — BP 132/74 | HR 73 | Temp 98.2°F | Ht 71.5 in | Wt 278.4 lb

## 2017-06-09 DIAGNOSIS — K219 Gastro-esophageal reflux disease without esophagitis: Secondary | ICD-10-CM

## 2017-06-09 DIAGNOSIS — R0789 Other chest pain: Secondary | ICD-10-CM

## 2017-06-09 DIAGNOSIS — G4733 Obstructive sleep apnea (adult) (pediatric): Secondary | ICD-10-CM

## 2017-06-09 DIAGNOSIS — R05 Cough: Secondary | ICD-10-CM | POA: Diagnosis not present

## 2017-06-09 DIAGNOSIS — R053 Chronic cough: Secondary | ICD-10-CM

## 2017-06-09 DIAGNOSIS — Z789 Other specified health status: Secondary | ICD-10-CM

## 2017-06-09 DIAGNOSIS — Z6835 Body mass index (BMI) 35.0-35.9, adult: Secondary | ICD-10-CM

## 2017-06-09 DIAGNOSIS — I10 Essential (primary) hypertension: Secondary | ICD-10-CM | POA: Diagnosis not present

## 2017-06-09 MED ORDER — HYDROCODONE-HOMATROPINE 5-1.5 MG/5ML PO SYRP: 5.0000 mL | ORAL_SOLUTION | Freq: Four times a day (QID) | ORAL | 0 refills | Status: DC | PRN

## 2017-06-09 MED ORDER — AMOXICILLIN 500 MG PO TABS: 500.0000 mg | ORAL_TABLET | Freq: Three times a day (TID) | ORAL | 1 refills | Status: DC

## 2017-06-09 NOTE — Progress Notes (Signed)
Subjective:    Patient ID: [place] King, male    DOB: [birth year], 72 y.o.   MRN: [id number]  HPI 72 y/o WM here for a follow up visit... he has a chronic cough x years and is dependent on codeine containing cough syrups... he was miserable when ENDAL HD went off the market and we had to substitute PHENERGAN EXPECTORANT w/ CODEINE - 1 tsp by mouth Q6H as needed for cough, limiting him to 1 pint per month; then his insurance company refused the Phenergan Expect & switched to [facility]... His new Primary Care doctor is DrPaz. ~  SEE PREV EPIC NOTES FOR OLDER DATA >>     PFT 11/2011 were essentially wnl w/ FVC=3.98 (76%), FEV1=3.46 (86%), %1sec=87, mid-flows are wnl at 112% predicted...   July 09, 2013:  65mo ROV and [place] has established primary care w/ DrPaz> here today for Pulm follow up & refill of his cough syrup...  CXR 4/15 showed norm heart size, clear lungs, NAD.Marland Kitchen.   ~  July 11, 2014:  48mo ROV & [place] reports a stable interval- his CC is left hip pain & DrPaz has rec Ortho eval (currently pending); His breathing is OK, chr cough w/o change, occurs in paroxysms, and he denies recent bronchitic infections etc;  He notes some SOB/DOE w/ yard work, but exercises in the gym 3d/wk ([employer]) & he keeps up well... He is followed by DrPaz for Primary Care, DrKerr for Endocrine, DrFeraru for Neurology, and DrSanders for Psyche...     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    Chronic Cough> managed for yrs w/ cough syrup, now Hycodan 1pt per month- 1tsp Q6h prn cough; cough remains dry- he denies sput, hemoptysis, SOB, CP, etc...    Hx upper airway resistance syndrome> prev eval by DrClance- last seen 7/13- responded very well to CPAP despite a fairly neg Sleep Study in 1999; he continues on CPAP nightly & resting well, excellent daytime alertness, etc...    GERD> on Nexium40Bid +  vigorous antireflux regimen (elev HOB 6", npo after dinner, etc); we reviewed LPR problems; he had Nissen fundoplication in 6606, last EGD was 2010 by DrDBrodie- no acute changes...    Obese> his weight is up 3# to 280 lbs now- BMI= 36 and we reviewed diet, exercise, wt reduction strategies...  We reviewed prob list, meds, xrays and labs> see below for updates >> meds refilled per request...   LABS 3/16 by DrPaz> FLP- at goals;  Chems- ok x BS=121, A1c=6.3;  CBC- wnl...  CXR 06/2104 showed borderline heart size, clear lungs, NAD- DJD in Tspine...  (I personally reviewed the images in Epic)   ~  January 16, 2015:  28mo ROV & [place] states "my lungs are acting up this month" c/o incr cough, chest congestion, but denies sput/ CP/ reflux or burning; he denies SOB & states his chr DOE is stable, no change; He wants his hydrocodone cough syrup refilled;  He notes that he was getting tired w/ DOE at [place] started on Testos shots w/ improvement & feeling good now he says...    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    Chronic Cough> managed for yrs w/ cough syrup, now Hycodan 1pt per month- 1tsp  Q6h prn cough; cough remains dry- he denies sput, hemoptysis, SOB, CP, etc...    Hx upper airway resistance syndrome> prev eval by DrClance- last seen 7/13- responded very well to CPAP despite a fairly neg Sleep Study in 1999; he continues on CPAP nightly & resting well, excellent daytime alertness, etc...    GERD> on Nexium40Bid + vigorous antireflux regimen (elev HOB 6", npo after dinner, etc); we reviewed LPR problems; he had Nissen fundoplication in 1062, last EGD was 2010 by DrDBrodie- no acute changes...    Obese> his weight is down 3# to 277 lbs now- BMI= 36 and we reviewed diet, exercise, wt reduction strategies...     Hyperlipidemia>  On Crestor20 and Tricor145 => note Feno145 was started in 2010 when  TG was >400 & Cres20 added by DrPaz in 2014; suggest that he stop the Fenofibrate at this time.    Low-T>  He is on Depo Testos 100mg  Q2wks per DrKerr, Endocrine (seen 11/2014 w/ Low-T, prediabetes, low bone mass & low VitD- note reviewed)    Dysthymia>  On Paxil-CR 25 and Klonopin 1mg  Bid... We reviewed prob list, meds, xrays and labs> see below for updates >> meds refilled per request, his PCP is DrPaz... IMP/PLAN>>  We discussed adding the MUCINEX 1200mg  bid + fluids;  We refilled his HYCODAN 1pt- 1tsp Q6H prn cough...  ~  Aug 07, 2015:  35mo ROV & [place] was seen by TP 05/26/15 for his OSA/upper airway resistance syndrome on CPAP16 x yrs w/ prev eval by DrClance (last seen 2013);  He needed a new CPAP machine & wanted the Dream machine=> this was arranged via [facility] & he is quite satisfied;  He has returned on this occais for a CPAP download to meet medicare criteria for on-going payment for his CPAP;  He is resting well, wakes refreshed in the AM 7 denies sleep pressure during the day, energy is satis, no daytime hypersomnolence, etc...  CPAP download required him to bring his machine into the office and it provides the following data for April-May2017> used 30/30 days, ave usage 8H/d, AHI=5.5 on his CPAP16, he did have 16% in leak & is working w/ a new mask at present...  EXAM shows Afeb, VSS, O2sat=94% on RA at rest; Wt=276#, 6'2"Tall, BMI=35;  HEENT- neg- mallampati3;  Chest- decr BS bilat, clear w/o w/r/r;  Heart- RR w/o m/r/g;  Abd- obese, soft, nontender;  Ext- neg w/o c/c/e;  Neuro- intact... IMP/PLAN>>  [place] is stable on his CPAP 16 & doing well w/ his new machine, good compliance, tolerated well; Rec to continue same...   ~  December 11, 2015>  55mo ROV & [place] is +-stable, c/o rib pain (recently on the right), chr cough as before, "spasms and pain" goes across his back; "My breathing doesn't want to quit hurting", notes bilat pain in ribs c/w an AtypCWP... 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    Chronic Cough> managed for yrs w/ cough syrup, now Hycodan 1pt per month- 1tsp Q6h prn cough; cough remains dry- he denies sput, hemoptysis, SOB, etc...    Hx upper airway resistance syndrome> prev eval by DrClance- last seen 7/13- responded very well to CPAP despite a fairly neg Sleep Study in 1999; he continues on CPAP nightly & resting well,  excellent daytime alertness, etc; we arranged a new machine for him 08/2015 via [facility] & new mask has helped decr the amt of leak...    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 We discussed Rx for this CWP w/ rest/ heat/ lidocaine patches/ & we will add BACLOFEN 10mg  Tid; I indicated next step is prob Pain clinic referral...    Medical issues> followed by DrPaz & DrKerr EXAM shows Afeb, VSS, O2sat=96% on RA at rest; Wt=277#, 6'2"Tall, BMI=35;  HEENT- neg- mallampati3;  Chest- decr BS bilat, clear w/o w/r/r;  Heart- RR w/o m/r/g;  Abd- obese, soft, nontender;  Ext- neg w/o c/c/e;  Neuro- intact...  CXR 12/11/15>  Norm heart size, atherosclerotic changes in Ao, clear lungs- NAD, mod thoracic spondylosis... IMP/PLAN>>  Despite his mult somatic complaints Laurance is reasonable stable w/ his chr cough, chr sinus, UARS, and diffuse aches and pains;  We discussed Rx w/ rest/ heat to the chest wall/ topical lidocaine patches/ and he will continue his current meds from DrPaz, DrKerr, & DrGibson; he can use MUCINEX OTC for any phlegm, and we are adding BACLOFEN 10mg  tid for the CWP & back spasms; CXR is clear; Amox & Hycodan refilled per his request, he says he will get the 2017 Flu vaccine at  [facility]...   ~  June 10, 2016:  11m [place] tells me that he has had several episodes of bronchitis over the last few months "I take Amox for a few days and it eases up" but I told him to take the whole course of Ab for 7-10 days to knock it out... Currently notes mild cough (esp Qhs), dry- no phlegm, no hemoptysis, chr stable SOB/DOE, still at 276# and says he's exercising 3x/wk (states that he lost to 260#, then regained 15# in 1 day!);      Hx AR, chr sinusitis, chr cough> on Hycodan prn & lim to 1pt/mo; he has Amox500 to keep on hand & uses prn; no pos cultures or pathogens identified...    UARS> on CPAP Qhs, [facility] DME, no issues w/ mask/ pressure/ says he uses it regularly, rests well, wakes refreshed & no daytime hypersomnolence...    HBP, Chr CWP> AtypCWP on Amlod10, BP= 150/70, we reviewed diet/ exercise/ lose wt...     PCP=> DrPaz & last seen 05/27/16-- noted that pt self discontinued his HCT; followed for HBP, HL, DM, Obesity, BPH w/ elev PSA, etc...    Endocrine=> DrKerr followed for pre-DM, Low-T & last seen 12/18/15-- BS=120, A1c=6.3, PSA=3.51, CBC- ok w/ Hg=15.8, Cr=1.16, Testos=373 on shots q2wks...     Neuro=> DrFeraru in [place]-- seen 12/12/15 for leg pain, migraines, LBP, periph neuropathy; she writes for [employer], he has had MRI of Cspine & Lspine & her notes are reviewed... EXAM shows Afeb, VSS, O2sat=96% on RA at rest; Wt=276#, 6'2"Tall, BMI=35;  HEENT- neg- mallampati3;  Chest- decr BS bilat, clear w/o w/r/r;  Heart- RR w/o m/r/g;  Abd- obese, soft, nontender;  Ext- neg w/o c/c/e;  Neuro- intact... IMP/PLAN>>  Ronald King is overall stable from the pulmonary standpoint; we refilled his Hycodan for 1 pt / month and refilled the Amoxicillin that he insists that he keep on had; he will call for problems & we plan rov recheck 47mo...  ~  December 16, 2016:  33mo ROV & [place] returns to get his Hycodan cough syrup and Amoxicillin prescriptions refilled>  He notes that his cough has increased, worse  esp at nights and after eating; he has intermittent beige sput production, no hemoptysis; he notesDOE w/ exertion- eg mowing but he goes to the gym 3d/wk, CC is "my knees";  He has hx Nissen fundoplication 5573 w/ several f/u EGDs by DrDBrodie;  DrPaz is his PCP=> We reviewed the following interval Epic notes, follow up visits>    He saw ENDOCRINE-DrKerr on 06/17/16>  Followed for prediabetes, hypogonadism, low bone mass; on VitD 2000u/d, Testos200mg /ml- taking 140mg  IM Q3wks, and "diet"; Testos was low at 106, BS=117, A1c=6.4, PSA=3.68, Hg=15.3, Cr=1.14...     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    He saw NEURO-DrFeraru, [facility], [place] on 07/26/16>  Hx neuropathy & migraines; also LBP/ neck pain; on Norco5 which she writes for pt; continues to follow Q23mo...    He saw PCP-DrPaz 07/29/16>  Medicare wellness visit, 19 page note reviewed... 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    Chronic Cough> managed for yrs w/ cough syrup, now Hycodan 1pt per month- 1tsp Q6h prn cough; cough remains dry- he denies sput, hemoptysis, SOB, etc...    Hx upper airway resistance syndrome on CPAP> prev eval by DrClance- last seen 7/13- responded very well to CPAP despite a fairly neg Sleep Study in 1999; he continues on CPAP nightly & resting well, excellent daytime alertness, etc; we arranged a new machine for him 08/2015 via [facility] & new mask has helped decr the amt of leak...    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 We discussed Rx for this CWP w/ rest/  heat/ lidocaine patches/ & we will add BACLOFEN 10mg  Tid; I indicated next step is prob Pain clinic referral...    Medical issues> followed by DrPaz & DrKerr EXAM shows Afeb, VSS, O2sat=94% on RA at rest; Wt=281#, 6'2"Tall, BMI=35-6;  HEENT- neg- mallampati3;  Chest- decr BS bilat, clear w/o w/r/r;  Heart- RR w/o m/r/g;  Abd- obese, soft, nontender;  Ext- neg w/o c/c/e;  Neuro- intact...  CXR 12/16/16 (independently reviewed by me in the PACS system) showed norm heart size, clear lungs- NAD, sl pleural thickening, DJD Tspine...  IMP/PLAN>>  Ronald King remains stable from the pulm standpoint, he requests refills of his Hycodan (lim to 1pt per month) and Amoxicillin; his history is more suggestive of a reflux component & he is on Nexium40Bid, NPO after dinner, elev HOB 6", and we will refer back to GI for f/u GI eval...    ~  June 09, 2017:  57mo ROV & [place] is here for f/u of his chronic cough, recurrent sinusitis, and upper airway resistance syndrome> His PCP is DrPaz ([facility], [place])... Nevyn returns c/o HAs and recurrent sinus infections w/ 2-3 "flair-ups" he says but notes none of them were "full-blown"; he also reports that his left lung feels weak, like "steel-wool" in it & rough feeling;  He notes occas cough (on & off), intermittent whitish/ beige sput, no hemoptysis, occas chest pains (on & off), +sinus drainage, but no f/c/s (except after his Low-T shots)... We reviewed the following interval medical notes in Epic-EMR>      He saw ENDOCRINE- DrKerr on 12/30/16>  Followed for Low-T, low bone mass, prediabetes; on Testos200mg /ml- getting 140mg  IM Q3wks; Testos level was still low at 134 ([phone]); Chems showed BS=103, A1c=6.4, Cr=1.07, LFTs wnl; CBC showed Hg=15.7.Marland KitchenMarland Kitchen     He saw NEURO- DrMoser in [place] on 01/27/17>  He switched from DrFeraru because she would not write his hydrocodone pain rx anymore; followed for neuropathy pain (small fiber neuropathy) w/ burning 7 numbness in legs, runs in his family,  intol to Gabapentin, Lyrica, & Cymbalta; discomfort limits his ADLs, on Alpraz of anxiety & the Vicodin for pain (not taking regularly);  They wrote for [id number] & discussed poss depakote & spinal cord stim...    He saw GI- DrNandigam on 02/10/17>  Chr GERD, s/p Nissen in 2001, chr cough w/ worsening reflux; on Nexium40Bid=> switched to Prev30Bid & EGD done 02/23/17 showing norm esoph, intact Nissen wrap, norm stomach & duodenum, told to continue current meds...  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    Chronic Cough> managed for yrs w/ cough syrup, now Hydromet 1pt per month, max- 1tsp Q6h prn cough; cough remains dry- he denies sput, hemoptysis, SOB, etc...    Hx upper airway resistance syndrome on CPAP> prev eval by DrClance- last seen 7/13- responded very well to CPAP despite a fairly neg Sleep Study in 1999; he continues on CPAP nightly & resting well, excellent daytime alertness, etc; we arranged a new machine for him 08/2015 via [facility] & new mask has helped decr the amt of leak...    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 We discussed Rx for this CWP w/ rest/ heat/ lidocaine patches/ & we tried Baclofen prn as well...    Medical issues> followed by DrPaz & DrKerr EXAM shows Afeb, VSS, O2sat=95% on RA at rest; Wt=278#, 6'2"Tall, BMI=35-6;  HEENT- neg- mallampati3;  Chest- +tender, sl decr BS bilat, clear w/o w/r/r;  Heart- RR w/o m/r/g;  Abd- obese, soft, nontender, +diastasis;  Ext- neg w/o c/c/e;  Neuro- intact...  [facility]- DME in [place]: CPAP download 2/11 - 06/07/17 showed excellent compliance (30/30 days), 8-9H per night, min leak on CPAP 16 w/ AHI recorded as 8/hr...   IMP/PLAN>>  Problems as noted above-- [place] requests refill of his Hydromet cough syrup & Amoxicillin 500mg  tabs #30 for his next sinus infection;  Asked to continue current meds and follow up w/ DrPaz, DrMose, DrFeraru... He will cal for any issues in the interim & we plan rov recheck in 41mo...           Problem List:       Hx of SINUSITIS >> prev on NASONEX 2spBid + he requests Rx for AMOXICILLIN to keep on hand for travel... prev eval DrByers- "he wanted to do surg"... "my sinuses are crazy" but he refuses to consider the surg... also had dysphonia eval at [facility] in the 90's w/ Botox  tried.  OBSTRUCTIVE SLEEP APNEA, UPPER AIRWAY RESISTANCE SYNDROME>> he uses CPAP nightly... saw DrClance 12/09- he felt upper airway resistance syndrome... doing better w/ new CPAP machine in 2010. ~  7/13:  He had f/u DrClance & doing well, no changes made, continue same CPAP... ~  He remains stable on the CPAP, rests satis, no daytime sleepiness issues, etc... ~  08/2015> we contacted [facility] regarding new machine 7 to work w/ him regarding mask fit...  COUGH, CHRONIC >> as above... he had an extensive eval in the past w/ GI, ENT, Pulm, etc... he used ENDAL-HD, then  PHEN EXPECT w/ CODEINE, now HYCODAN 1tsp Q6h prn cough, lim to 1pt/mo (Med choice lim by his insurance). ~  Cough was no diff off ACE/ ARB in past & he does not want to change meds... ~  CXR 1/10 showed clear,  just min biapical pleural thickening... ~  2/11: states intol to Symbicort w/ HAs... try ADVAIR 250Bid... ~  CXR 2/11 showed clear lungs, NAD.Marland Kitchen. ~  CXR 9/12 showed chr bronchitic changes, NAD... ~  PFT 9/13 showed FVC=3.98 (76%), FEV1=3.46 (86%), %1sec=87, mid-flows=112% predicted...  ~  CXR 9/13 showed normal heart size, clear lungs, DJD spine... ~  4/15: his insurance company is requesting change from Phenergan expect w/ cod- we will write for Hycodan one tsp Q6h prn & continue to limit to 1pt/mo... ~  CXR 4/15 showed norm heart  size, clear lungs, NAD...  ~  10/15: his insurance has again changed the cough syrup, this time from Phenergan expect w/ cod to [facility]- same rx 1tsp Q6h prn cough, lim to 1pt/mo... 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  ~  CXR 12/11/15>  Norm heart size, atherosclerotic changes in Ao, clear lungs- NAD, mod thoracic spondylosis   FOLLOWED FOR PRIMARY CARE BY DrPAZ >>   HYPERTENSION (ICD-401.9) - meds adjusted by DrCrenshaw on NORVASC 10mg /d & HCTZ 25mg - 1/2 tab daily...  ~  8/12:  BP= 134/82 & denies HA, fatigue, visual changes, CP, palipit, dizziness, syncope, dyspnea, edema, etc... ~  3/13:  BP= 124/76 & he denies CP, palpit, SOB, edema... ~  9/13:  BP= 110/66 & he remains asymptomatic... ~  He has established Primary Care f/u w/ DrPaz => on Amlod10, Hct12.5, & off prev Lisin; BP= 122/64 today.  DYSLIPIDEMIA (ICD-272.4) - on TRICOR 145mg /d but refuses Statin meds or Lipid Clinic referral... ~  FLP 1/10 showed TChol 161, TG 239, HDL 30, LDL 72... he wants to control w/ diet. ~  FLP 9/10 showed TChol 192, TG 426, HDL 30, LDL 80... rec> start [id number]. ~  [place] 2/11 showed TChol 173, TG 102, HDL 37, LDL 115... continue Tricor Rx + diet. ~  FLP 2/12 showed TChol 221, TG 249, HDL 31, LDL 140... Reminded to take med regularly & get wt down. ~  FLP 8/12 on Tric145 showed TChol 194, TG 169, HDL 32, LDL 128... Still not at goal, refuses statin med. ~  [place] 3/13 on Tric145 showed TChol 229, TG 177, HDL 35, LDL 140... rec to start CRESTOR20 + better low fat diet! ~  FLP 5/13 on Tric145+Cres20 showed TChol 151, TG 188, HDL 33, LDL 80... Continue same. ~  Lipids followed by DrPaz, now on Cres20 monotherapy => labs reviewed in EPIC...  BORDERLINE DM >> Dx by Lyn Hollingshead, treated w/ diet alone & labs 9/13 showed BS=91, A1c=6.1  OBESITY (ICD-278.00) - weight = 274#,  6\' 2"  tall =  BMI of 35... discussed diet + exercise and the need for weight reduction both from his overall  health status and his OSA... "I moderate what I eat". ~  weight 1/10 = 267# ~  weight 2/11 = 272#... discussed again! ~  Weight 2/12 = 276#... He is unconcerned! ~  Weight 8/12 = 274#... Reviewed diet & exercise needed. ~  Weight 3/13 = 274#... He is NOT trying. ~  Weight 9/13 = 272# ~  Weight 3/14 = 274# ~  Weight 10/14 = 280#  GERD (ICD-530.81) - on NEXIUM 40mg Bid & off the prev hs Reglan... s/p lap nissen 3/01 by DrMartin.... ~  EGD 11/05 showed prev Nissen, +gastritis... ~  EGD 6/10 by DrDBrodie showed functioning Nissen, post-op changes, sessile polyp in distal esoph= benign mucosa.  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 ~  f/u colonoscopy 6/10 by DrDBrodie showed negative x for int hems...  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  Finally started ANDROGEL 5gm packet Qd in 2012 ~  labs 1/10 showed PSA= 1.63 ~  labs 2/11 showed PSA= 1.65,  ~  Labs 1/12 showed Testosterone level = 160-205 ~  Labs 2/12 showed PSA = 1.63, Testosterone level = 553 ~  Labs 8/12 showed Testosterone level = 428... rec continue daily Androgel. ~  Labs 3/13 showed PSA= 1.73, Testosterone level = 185 & states he is using the Androgel Q3rd day, rec to incr to daily. ~  Follow up labs & med adjustments per DrPaz => he referred pt to Endocrine, DrKerr... 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  VITAMIN D DEFICIENCY (ICD-268.9) - on Vit D OTC 2000 u daily... ~  labs 1/10 showed Vit D level = 16... Vit D 2000 u daily started. ~  labs 2/11 showed Vit D level = 34... continue supplement. ~  Labs 8/12 showed Vit D level = 47... continue same.  HEADACHE (ICD-784.0) - Chr daily migraines in the past w/ prev HA eval by DrFreeman  (Botox shots helped in the past)...  NEUROPATHY (ICD-355.9) - c/o burning in legs and eval from Neuro in [facility] & Rx w/ Neurontin + Hydrocodone for pain... this may be part of a familial trait as several relatives have severe periph neuropathy (?hereditary syndrome)- we don't have notes from [facility] neurology. ~  Pt reports that he was intol to Neurontin, Lyrica, & one other ("memory gaps") & he refused Cymbalta trial... ~  3/13:  Reviewed w/ pt> he continues on VICODIN 2.5-500 from DrGibson up to Tid, 90/mo w/ refills from him... ~  Now followed by [facility], DrFeraru & DrGibson on Vits and Hydrocodone 2.5mg  Bid as needed...   ANXIETY (ICD-300.00) - on PAXIL CR 25mg /d & KLONOPIN 1mg  Bid as needed... they were prev perscribed by ?psychiatrist? but he notes "I've been on these for >67yrs"... he still sees DrSanders for these prescriptions...   Past Surgical History:  Procedure Laterality Date  . ANAL FISSURE REPAIR    . hand fracture surgery     left  . KNEE ARTHROSCOPY  2007   Dr. Marlou Sa  . LAPAROSCOPIC NISSEN FUNDOPLICATION  04/6710   Dr. Hassell Done  . NOSE SURGERY    . TONSILLECTOMY      Outpatient Encounter Medications as  of 06/09/2017  Medication Sig  . amLODipine (NORVASC) 10 MG tablet Take 1 tablet (10 mg total) by mouth daily.  Marland Kitchen ascorbic acid (VITAMIN C) 500 MG tablet Take 1,000 mg by mouth daily.   Marland Kitchen aspirin 81 MG tablet Take 81 mg by mouth daily.    . Calcium Carbonate-Vitamin D (RA CALCIUM PLUS VITAMIN D) 600-400 MG-UNIT per tablet Take 1 tablet by mouth daily.    . Cholecalciferol (VITAMIN D) 2000 UNITS tablet Take 2,000 Units by mouth daily.    . clonazePAM (KLONOPIN) 1 MG tablet Take 1 mg by mouth 2 (two) times daily as needed.    . Coenzyme Q10 (CO Q 10 PO) Take 1 tablet by mouth daily.   . Cyanocobalamin (B-12) 2000 MCG TABS Take 1 tablet by mouth daily.  . fenofibrate (TRICOR) 145 MG tablet Take 1 tablet (145 mg total) by mouth daily.  Marland Kitchen HYDROcodone-homatropine  (HYCODAN) 5-1.5 MG/5ML syrup Take 5 mLs by mouth every 6 (six) hours as needed for cough.  . lansoprazole (PREVACID) 30 MG capsule Take 1 capsule (30 mg total) by mouth 2 (two) times daily before a meal.  . Multiple Vitamins-Minerals (CENTRUM SILVER PO) Take 1 tablet by mouth daily. Reported on 03/17/2015  . PARoxetine (PAXIL) 20 MG tablet Take 20 mg by mouth daily.  Marland Kitchen pyridOXINE (VITAMIN B-6) 100 MG tablet Take 100 mg by mouth daily.  . rosuvastatin (CRESTOR) 20 MG tablet Take 1 tablet (20 mg total) by mouth every other day.  . testosterone cypionate (DEPOTESTOTERONE CYPIONATE) 200 MG/ML injection Inject into the muscle every 21 ( twenty-one) days.   . [DISCONTINUED] amoxicillin (AMOXIL) 500 MG tablet Take 1 tablet (500 mg total) by mouth 3 (three) times daily. As directed  . [DISCONTINUED] HYDROcodone-homatropine (HYCODAN) 5-1.5 MG/5ML syrup Take 5 mLs by mouth every 8 (eight) hours as needed for cough.  Marland Kitchen amoxicillin (AMOXIL) 500 MG tablet Take 1 tablet (500 mg total) by mouth 3 (three) times daily. Take as directed  . [DISCONTINUED] amoxicillin (AMOXIL) 500 MG tablet Take 500 mg by mouth 3 (three) times daily. Take as directed   No facility-administered encounter medications on file as of 06/09/2017.     Allergies  Allergen Reactions  . Montelukast Shortness Of Breath  . Ceftin [Cefuroxime Axetil]   . Lyrica [Pregabalin] Other (See Comments)    Drowsiness  . Neurontin [Gabapentin] Other (See Comments)    Drowsiness   . Singulair [Montelukast Sodium]     Immunization History  Administered Date(s) Administered  . Influenza Split 01/28/2011, 12/16/2011  . Influenza Whole 12/25/2008  . Influenza, High Dose Seasonal PF 01/09/2015, 12/13/2016  . Influenza,inj,Quad PF,6+ Mos 01/10/2014  . Influenza-Unspecified 11/27/2012  . Pneumococcal Conjugate-13 09/06/2013  . Pneumococcal Polysaccharide-23 12/16/2011  . Td 09/24/2015  . Tdap 03/29/2005  . Zoster 12/15/2012  he got the 2018 FLU  vaccine at [facility] recently...   Current Medications, Allergies, Past Medical History, Past Surgical History, Family History, and Social History were reviewed in [employer] record.   Review of Systems        See HPI - all other systems neg except as noted...  The patient complains of cough, & dyspnea on exertion.  The patient denies anorexia, fever, weight loss, weight gain, vision loss, decreased hearing, hoarseness, chest pain, syncope, peripheral edema, headaches, hemoptysis, abdominal pain, melena, hematochezia, severe indigestion/heartburn, hematuria, incontinence, muscle weakness, suspicious skin lesions, transient blindness, difficulty walking, depression, unusual weight change, abnormal bleeding, enlarged lymph nodes, and angioedema.  Objective:   Physical Exam      WD, Obese, 72 y/o WM in NAD... GENERAL:  Alert & oriented; pleasant & cooperative... HEENT:  [place]/AT, EOM-wnl, PERRLA, EACs-clear, TMs-wnl, NOSE-clear, THROAT-clear & wnl. NECK:  Supple w/ fairROM; no JVD; normal carotid impulses w/o bruits; no thyromegaly or nodules palpated; no lymphadenopathy. CHEST:  Clear to P & A; without wheezes/ rales/ or rhonchi; he has an intermittent dry irritative cough... HEART:  Regular Rhythm; without murmurs/ rubs/ or gallops. ABDOMEN:  Obese, soft & nontender; normal bowel sounds; no organomegaly or masses detected. EXT: without deformities, mild arthritic changes; no varicose veins/ venous insuffic/ or edema. NEURO:  CN's intact; motor testing normal; no focal neuro deficits DERM:  No lesions noted; no rash etc...  RADIOLOGY DATA:  Reviewed in the EPIC EMR & discussed w/ the patient...  LABORATORY DATA:  Reviewed in the EPIC EMR & discussed w/ the patient...   Assessment & Plan:   12/16/16>   Ronald King remains stable from the pulm standpoint, he requests refills of his Hycodan (lim to 1pt per month) and Amoxicillin; his history is more suggestive of a reflux  component & he is on Nexium40Bid, NPO after dinner, elev HOB 6", and we will refer back to GI for f/u GI eval 06/09/17>   Problems as noted above-- Randon requests refill of his Hydromet cough syrup & Amoxicillin 500mg  tabs #30 for his next sinus infection;  Asked to continue current meds and follow up w/ DrPaz, DrMose, DrFeraru... He will cal for any issues in the interim & we plan rov recheck in 83mo   OSA, UARS>  He uses CPAP nightly, continue same...  Chronic Cough>  He is quite dependent on the cough syrup, limited to one pint per month; doesn't want to change meds but insurance won't fill the Phenergan expectorant; change to Hycodan- 1 tsp Q8h prn...  Atyp CWP>  We discussed rest the chest/ apply heat/ topical lidocaine patches/ etc;  We added BACLOFEN 10mg  tid for back spasms...   MEDICAL PROBLEMS FOLLOWED by Dahlia Bailiff, DrFERARU >>   HBP>  Controlled on CCB & Diuretic now, off prev ACE and cough is improved...  DYSLIPIDEMIA>  On Crestor, Tricor + diet per DrPaz...   Obesity>  We reviewed diet & exercise but he has been noncompliant w/ both...  GI> GERD, IBS, Hems>  Stable on NexiumBid, etc...  GU> BPH, ED, Hypogonadism>  Followed by Lyn Hollingshead, DrKerr, & Urology...  DJD>  Followed by DrDean w/ prev left knee arthroscopy...  Vit D Defic>  On OTC Vit D supplementation & ok...  NEUROPATHY>  Followed by DrFeraru in [place] & intol to Neurontin/ Lyrica, etc; on VICODIN 2.5-500 Tid per Neurology...  ANXIETY>  he still sees Psychologist, occupational, for his Paxil & Klonopin...   Patient's Medications  New Prescriptions   No medications on file  Previous Medications   AMLODIPINE (NORVASC) 10 MG TABLET    Take 1 tablet (10 mg total) by mouth daily.   ASCORBIC ACID (VITAMIN C) 500 MG TABLET    Take 1,000 mg by mouth daily.    ASPIRIN 81 MG TABLET    Take 81 mg by mouth daily.     CALCIUM CARBONATE-VITAMIN D (RA CALCIUM PLUS VITAMIN D) 600-400 MG-UNIT PER TABLET    Take 1 tablet by mouth  daily.     CHOLECALCIFEROL (VITAMIN D) 2000 UNITS TABLET    Take 2,000 Units by mouth daily.     CLONAZEPAM (KLONOPIN) 1 MG TABLET  Take 1 mg by mouth 2 (two) times daily as needed.     COENZYME Q10 (CO Q 10 PO)    Take 1 tablet by mouth daily.    CYANOCOBALAMIN (B-12) 2000 MCG TABS    Take 1 tablet by mouth daily.   FENOFIBRATE (TRICOR) 145 MG TABLET    Take 1 tablet (145 mg total) by mouth daily.   LANSOPRAZOLE (PREVACID) 30 MG CAPSULE    Take 1 capsule (30 mg total) by mouth 2 (two) times daily before a meal.   MULTIPLE VITAMINS-MINERALS (CENTRUM SILVER PO)    Take 1 tablet by mouth daily. Reported on 03/17/2015   PAROXETINE (PAXIL) 20 MG TABLET    Take 20 mg by mouth daily.   PYRIDOXINE (VITAMIN B-6) 100 MG TABLET    Take 100 mg by mouth daily.   ROSUVASTATIN (CRESTOR) 20 MG TABLET    Take 1 tablet (20 mg total) by mouth every other day.   TESTOSTERONE CYPIONATE (DEPOTESTOTERONE CYPIONATE) 200 MG/ML INJECTION    Inject into the muscle every 21 ( twenty-one) days.   Modified Medications   Modified Medication Previous Medication   AMOXICILLIN (AMOXIL) 500 MG TABLET amoxicillin (AMOXIL) 500 MG tablet      Take 1 tablet (500 mg total) by mouth 3 (three) times daily. Take as directed    Take 500 mg by mouth 3 (three) times daily. Take as directed   HYDROCODONE-HOMATROPINE (HYCODAN) 5-1.5 MG/5ML SYRUP HYDROcodone-homatropine (HYCODAN) 5-1.5 MG/5ML syrup      Take 5 mLs by mouth every 6 (six) hours as needed for cough.    Take 5 mLs by mouth every 8 (eight) hours as needed for cough.  Discontinued Medications   AMOXICILLIN (AMOXIL) 500 MG TABLET    Take 1 tablet (500 mg total) by mouth 3 (three) times daily. As directed

## 2017-06-09 NOTE — Patient Instructions (Signed)
Today we updated your med list in our EPIC system...    Continue your current medications the same...  We refilled your HYCODAN - one tsp up to every [place] as needed for cough (not to exceed 1 Pt per month)... We refilled your AMOXICILLIN 500mg  tabs - one tab three times daily for infection...  Continue using your CPAP nightly...  Let's get on track w/ out diet & exercise program...    The goal is to lose 20 lbs!!!  Call for any questions...  Let's plan a follow up visit in 59mo, sooner if needed for problems.Marland KitchenMarland Kitchen

## 2017-07-06 ENCOUNTER — Other Ambulatory Visit: Payer: Self-pay | Admitting: Internal Medicine

## 2017-07-11 ENCOUNTER — Encounter: Payer: Self-pay | Admitting: Internal Medicine

## 2017-07-27 NOTE — Progress Notes (Deleted)
Subjective:   Ronald King is a 72 y.o. male who presents for Medicare Annual (Subsequent) preventive examination.  Review of Systems: No ROS.  Medicare Wellness Visit. Additional risk factors are reflected in the social history.   Sleep patterns: Home Safety/Smoke Alarms: Feels safe in home. Smoke alarms in place.  Living environment; residence and Firearm Safety:   Male:   CCS-     PSA-  Lab Results  Component Value Date   PSA 3.09 08/11/2016   PSA 3.68 06/17/2016   PSA 3.51 12/18/2015       Objective:     Vitals: There were no vitals taken for this visit.  There is no height or weight on file to calculate BMI.  Advanced Directives 02/23/2017 09/24/2015 05/26/2015  Does Patient Have a Medical Advance Directive? Yes Yes Yes  Type of Paramedic of [place];Living will [place];Living will [place];Living will  Copy of [place] in Chart? - No - copy requested -    Tobacco Social History   Tobacco Use  Smoking Status Former Smoker  . Packs/day: 1.00  . Years: 3.00  . Pack years: 3.00  . Last attempt to quit: 03/29/1965  . Years since quitting: 52.3  Smokeless Tobacco Never Used     Counseling given: Not Answered   Clinical Intake:                       Past Medical History:  Diagnosis Date  . Anemia   . Anxiety   . Chronic bronchitis   . Chronic cough   . Diabetes ([place]) 07-2011   per Dr Buddy Duty  . DJD (degenerative joint disease)   . Dyslipidemia   . Esophageal polyp   . GERD (gastroesophageal reflux disease)    s/p [employer] F.  . Hemorrhoids, internal   . History of gastrointestinal hemorrhage   . Hypertension   . Hypogonadism, male    per Dr Buddy Duty  . Neuromuscular disorder ([facility])    neuropathy feet  . Neuropathy    per neuro-Dr Ferrarou vicodin  . OSA (obstructive sleep apnea)    on CPAP  . Sleep apnea    CPAP at night  . Vitamin D deficiency      Past Surgical History:  Procedure Laterality Date  . ANAL FISSURE REPAIR    . hand fracture surgery     left  . KNEE ARTHROSCOPY  2007   Dr. Marlou Sa  . LAPAROSCOPIC NISSEN FUNDOPLICATION  08/9676   Dr. Hassell Done  . NOSE SURGERY    . TONSILLECTOMY     Family History  Problem Relation Age of Onset  . Cerebral aneurysm Father   . Alcohol abuse Father   . Neuropathy Mother   . Heart disease Mother        died at age 67  . Migraines Brother        several  . Diabetes Maternal Uncle   . CAD Brother        CABG in his 69s  . Colon cancer Neg Hx   . Prostate cancer Neg Hx    Social History   Socioeconomic History  . Marital status: Married    Spouse name: Not on file  . Number of children: 1  . Years of education: Not on file  . Highest education level: Not on file  Occupational History  . Occupation: Technical brewer, retired    Comment: retired  .  Occupation: minister    Comment: [employer], [place]  . Financial resource strain: Not on file  . Food insecurity:    Worry: Not on file    Inability: Not on file  . Transportation needs:    Medical: Not on file    Non-medical: Not on file  Tobacco Use  . Smoking status: Former Smoker    Packs/day: 1.00    Years: 3.00    Pack years: 3.00    Last attempt to quit: 03/29/1965    Years since quitting: 52.3  . Smokeless tobacco: Never Used  Substance and Sexual Activity  . Alcohol use: No  . Drug use: No  . Sexual activity: Not on file  Lifestyle  . Physical activity:    Days per week: Not on file    Minutes per session: Not on file  . Stress: Not on file  Relationships  . Social connections:    Talks on phone: Not on file    Gets together: Not on file    Attends religious service: Not on file    Active member of club or organization: Not on file    Attends meetings of clubs or organizations: Not on file    Relationship status: Not on file  Other Topics Concern  . Not on file  Social  History Narrative   Lives w/ wife    Outpatient Encounter Medications as of 08/01/2017  Medication Sig  . amLODipine (NORVASC) 10 MG tablet Take 1 tablet (10 mg total) by mouth daily.  Marland Kitchen amoxicillin (AMOXIL) 500 MG tablet Take 1 tablet (500 mg total) by mouth 3 (three) times daily. Take as directed  . ascorbic acid (VITAMIN C) 500 MG tablet Take 1,000 mg by mouth daily.   Marland Kitchen aspirin 81 MG tablet Take 81 mg by mouth daily.    . Calcium Carbonate-Vitamin D (RA CALCIUM PLUS VITAMIN D) 600-400 MG-UNIT per tablet Take 1 tablet by mouth daily.    . Cholecalciferol (VITAMIN D) 2000 UNITS tablet Take 2,000 Units by mouth daily.    . clonazePAM (KLONOPIN) 1 MG tablet Take 1 mg by mouth 2 (two) times daily as needed.    . Coenzyme Q10 (CO Q 10 PO) Take 1 tablet by mouth daily.   . Cyanocobalamin (B-12) 2000 MCG TABS Take 1 tablet by mouth daily.  . fenofibrate (TRICOR) 145 MG tablet Take 1 tablet (145 mg total) by mouth daily.  Marland Kitchen HYDROcodone-homatropine (HYCODAN) 5-1.5 MG/5ML syrup Take 5 mLs by mouth every 6 (six) hours as needed for cough.  . lansoprazole (PREVACID) 30 MG capsule Take 1 capsule (30 mg total) by mouth 2 (two) times daily before a meal.  . Multiple Vitamins-Minerals (CENTRUM SILVER PO) Take 1 tablet by mouth daily. Reported on 03/17/2015  . PARoxetine (PAXIL) 20 MG tablet Take 20 mg by mouth daily.  Marland Kitchen pyridOXINE (VITAMIN B-6) 100 MG tablet Take 100 mg by mouth daily.  . rosuvastatin (CRESTOR) 20 MG tablet Take 1 tablet (20 mg total) by mouth every other day.  . testosterone cypionate (DEPOTESTOTERONE CYPIONATE) 200 MG/ML injection Inject into the muscle every 21 ( twenty-one) days.    No facility-administered encounter medications on file as of 08/01/2017.     Activities of Daily Living In your present state of health, do you have any difficulty performing the following activities: 07/29/2016  Hearing? N  Vision? N  Difficulty concentrating or making decisions? N  Walking or climbing  stairs? N  Dressing  or bathing? N  Doing errands, shopping? N  Preparing Food and eating ? N  Using the Toilet? N  In the past six months, have you accidently leaked urine? N  Do you have problems with loss of bowel control? N  Managing your Medications? N  Managing your Finances? N  Housekeeping or managing your Housekeeping? N  Some recent data might be hidden    Patient Care Team: Colon Branch, MD as PCP - General (Internal Medicine) Delrae Rend, MD as Consulting Physician (Endocrinology) Noralee Space, MD as Consulting Physician (Pulmonary Disease) Kerin Perna., MD as Consulting Physician (Neurology) Nickie Retort, MD as Consulting Physician (Urology)    Assessment:   This is a routine wellness examination for Ardian.  Exercise Activities and Dietary recommendations    Goals    None      Fall Risk Fall Risk  07/29/2016 09/24/2015 09/12/2014 07/09/2013 06/01/2012  Falls in the past year? No Yes No No No  Number falls in past yr: - 1 - - -  Injury with Fall? - No - - -  Comment - Pt fell off a curb walking to get his new CPAP - - -   Is the patient's home free of loose throw rugs in walkways, pet beds, electrical cords, etc?   {Blank single:19197::"yes","no"}      Grab bars in the bathroom? {Blank single:19197::"yes","no"}      Handrails on the stairs?   {Blank single:19197::"yes","no"}      Adequate lighting?   {Blank single:19197::"yes","no"}  Timed Get Up and Go performed: ***  Depression Screen PHQ 2/9 Scores 07/29/2016 09/24/2015 09/12/2014 07/09/2013  PHQ - 2 Score 0 0 0 0     Cognitive Function MMSE - Mini Mental State Exam 09/24/2015  Orientation to time 5  Orientation to Place 5  Registration 3  Attention/ Calculation 5  Recall 3  Language- name 2 objects 2  Language- repeat 1  Language- follow 3 step command 3  Language- read & follow direction 1  Write a sentence 1  Copy design 1  Total score 30        Immunization History    Administered Date(s) Administered  . Influenza Split 01/28/2011, 12/16/2011  . Influenza Whole 12/25/2008  . Influenza, High Dose Seasonal PF 01/09/2015, 12/13/2016  . Influenza,inj,Quad PF,6+ Mos 01/10/2014  . Influenza-Unspecified 11/27/2012  . Pneumococcal Conjugate-13 09/06/2013  . Pneumococcal Polysaccharide-23 12/16/2011  . Td 09/24/2015  . Tdap 03/29/2005  . Zoster 12/15/2012    Qualifies for Shingles Vaccine?***  Screening Tests Health Maintenance  Topic Date Due  . OPHTHALMOLOGY EXAM  10/19/2016  . HEMOGLOBIN A1C  12/18/2016  . FOOT EXAM  07/29/2017  . URINE MICROALBUMIN  07/29/2017  . INFLUENZA VACCINE  10/27/2017  . COLONOSCOPY  08/31/2018  . TETANUS/TDAP  09/23/2025  . Hepatitis C Screening  Completed  . PNA vac Low Risk Adult  Completed    Cancer Screenings: Lung: Low Dose CT Chest recommended if Age 61-80 years, 30 pack-year currently smoking OR have quit w/in 15years. Patient {DOES NOT does:27190::"does not"} qualify. Breast:  Up to date on Mammogram? {Yes/No:30480221}   Up to date of Bone Density/Dexa? {Yes/No:30480221} Colorectal: ***  Additional Screenings: ***: Hepatitis C Screening:      Plan:   ***   I have personally reviewed and noted the following in the patient's chart:   . Medical and social history . Use of alcohol, tobacco or illicit drugs  . Current medications and supplements .  Functional ability and status . Nutritional status . Physical activity . Advanced directives . List of other physicians . Hospitalizations, surgeries, and ER visits in previous 12 months . Vitals . Screenings to include cognitive, depression, and falls . Referrals and appointments  In addition, I have reviewed and discussed with patient certain preventive protocols, quality metrics, and best practice recommendations. A written personalized care plan for preventive services as well as general preventive health recommendations were provided to patient.      Shela Nevin, [place]  07/27/2017

## 2017-08-01 ENCOUNTER — Ambulatory Visit: Payer: Medicare Other | Admitting: *Deleted

## 2017-08-02 ENCOUNTER — Other Ambulatory Visit: Payer: Self-pay | Admitting: Internal Medicine

## 2017-08-05 NOTE — Progress Notes (Addendum)
Subjective:   Ronald King is a 72 y.o. male who presents for Medicare Annual (Subsequent) preventive examination.  Review of Systems: No ROS.  Medicare Wellness Visit. Additional risk factors are reflected in the social history. Cardiac Risk Factors include: advanced age (>51men, >18 women);hypertension;male gender;obesity (BMI >30kg/m2) Sleep patterns: wakes once to urinate. Wears cpap. Sleeps 6-8 hrs. Naps daily about 30-14min. Home Safety/Smoke Alarms: Feels safe in home. Smoke alarms in place.  Living environment; residence and Firearm Safety: Lives with in one story home with wife.   Male:   CCS-  2010   PSA-  Lab Results  Component Value Date   PSA 3.09 08/11/2016   PSA 3.68 06/17/2016   PSA 3.51 12/18/2015       Objective:     Vitals: BP (!) 144/68 (BP Location: Left Arm, Patient Position: Sitting, Cuff Size: Large)   Pulse 77   Ht 6\' 4"  (1.93 m)   Wt 277 lb (125.6 kg)   SpO2 95%   BMI 33.72 kg/m   Body mass index is 33.72 kg/m.  Advanced Directives 08/08/2017 02/23/2017 09/24/2015 05/26/2015  Does Patient Have a Medical Advance Directive? Yes Yes Yes Yes  Type of Paramedic of [place];Living will [place];Living will [place];Living will [place];Living will  Does patient want to make changes to medical advance directive? No - Patient declined - - -  Copy of [place] in Chart? No - copy requested - No - copy requested -    Tobacco Social History   Tobacco Use  Smoking Status Former Smoker  . Packs/day: 1.00  . Years: 3.00  . Pack years: 3.00  . Last attempt to quit: 03/29/1965  . Years since quitting: 52.3  Smokeless Tobacco Never Used     Counseling given: Not Answered   Clinical Intake: Pain : No/denies pain     Past Medical History:  Diagnosis Date  . Anemia   . Anxiety   . Chronic bronchitis   . Chronic cough   . Diabetes  ([place]) 07-2011   per Dr Buddy Duty  . DJD (degenerative joint disease)   . Dyslipidemia   . Esophageal polyp   . GERD (gastroesophageal reflux disease)    s/p Nissan F.  . Hemorrhoids, internal   . History of gastrointestinal hemorrhage   . Hypertension   . Hypogonadism, male    per Dr Buddy Duty  . Neuromuscular disorder (HCC)    neuropathy feet  . Neuropathy    per neuro-Dr Ferrarou vicodin  . OSA (obstructive sleep apnea)    on CPAP  . Sleep apnea    CPAP at night  . Vitamin D deficiency    Past Surgical History:  Procedure Laterality Date  . ANAL FISSURE REPAIR    . hand fracture surgery     left  . KNEE ARTHROSCOPY  2007   Dr. Marlou Sa  . LAPAROSCOPIC NISSEN FUNDOPLICATION  07/8525   Dr. Hassell Done  . NOSE SURGERY    . TONSILLECTOMY     Family History  Problem Relation Age of Onset  . Cerebral aneurysm Father   . Alcohol abuse Father   . Neuropathy Mother   . Heart disease Mother        died at age 60  . Migraines Brother        several  . Diabetes Maternal Uncle   . CAD Brother        CABG in  his 53s  . Colon cancer Neg Hx   . Prostate cancer Neg Hx    Social History   Socioeconomic History  . Marital status: Married    Spouse name: Not on file  . Number of children: 1  . Years of education: Not on file  . Highest education level: Not on file  Occupational History  . Occupation: Technical brewer, retired    Comment: retired  . 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 Financial resource strain: Not on file  . Food insecurity:    Worry: Not on file    Inability: Not on file  . Transportation needs:    Medical: Not on file    Non-medical: Not on file  Tobacco Use  . Smoking status: Former Smoker    Packs/day: 1.00    Years: 3.00    Pack years: 3.00    Last attempt to quit: 03/29/1965    Years since quitting: 52.3  . Smokeless tobacco: Never Used  Substance and Sexual Activity  . Alcohol use: No  . Drug use: No  .  Sexual activity: Not on file  Lifestyle  . Physical activity:    Days per week: Not on file    Minutes per session: Not on file  . Stress: Not on file  Relationships  . Social connections:    Talks on phone: Not on file    Gets together: Not on file    Attends religious service: Not on file    Active member of club or organization: Not on file    Attends meetings of clubs or organizations: Not on file    Relationship status: Not on file  Other Topics Concern  . Not on file  Social History Narrative   Lives w/ wife    Outpatient Encounter Medications as of 08/08/2017  Medication Sig  . amLODipine (NORVASC) 10 MG tablet Take 1 tablet (10 mg total) by mouth daily.  Marland Kitchen amoxicillin (AMOXIL) 500 MG tablet Take 1 tablet (500 mg total) by mouth 3 (three) times daily. Take as directed (Patient not taking: Reported on 08/08/2017)  . ascorbic acid (VITAMIN C) 500 MG tablet Take 1,000 mg by mouth daily.   Marland Kitchen aspirin 81 MG tablet Take 81 mg by mouth daily.    . Calcium Carbonate-Vitamin D (RA CALCIUM PLUS VITAMIN D) 600-400 MG-UNIT per tablet Take 1 tablet by mouth daily.    . Cholecalciferol (VITAMIN D) 2000 UNITS tablet Take 2,000 Units by mouth daily.    . clonazePAM (KLONOPIN) 1 MG tablet Take 1 mg by mouth 2 (two) times daily as needed.    . Coenzyme Q10 (CO Q 10 PO) Take 1 tablet by mouth daily.   . Cyanocobalamin (B-12) 2000 MCG TABS Take 1 tablet by mouth daily.  . fenofibrate (TRICOR) 145 MG tablet Take 1 tablet (145 mg total) by mouth daily.  Marland Kitchen HYDROcodone-homatropine (HYCODAN) 5-1.5 MG/5ML syrup Take 5 mLs by mouth every 6 (six) hours as needed for cough. (Patient not taking: Reported on 08/08/2017)  . lansoprazole (PREVACID) 30 MG capsule Take 1 capsule (30 mg total) by mouth 2 (two) times daily before a meal.  . Multiple Vitamins-Minerals (CENTRUM SILVER PO) Take 1 tablet by mouth daily. Reported on 03/17/2015  . PARoxetine (PAXIL) 20 MG tablet Take 20 mg by mouth daily.  Marland Kitchen pyridOXINE  (VITAMIN B-6) 100 MG tablet Take 100 mg by mouth daily.  . rosuvastatin (CRESTOR) 20 MG  tablet Take 1 tablet (20 mg total) by mouth every other day.  . [DISCONTINUED] testosterone cypionate (DEPOTESTOTERONE CYPIONATE) 200 MG/ML injection Inject into the muscle every 21 ( twenty-one) days.    No facility-administered encounter medications on file as of 08/08/2017.     Activities of Daily Living In your present state of health, do you have any difficulty performing the following activities: 08/08/2017 08/08/2017  Hearing? N N  Vision? N N  Difficulty concentrating or making decisions? N N  Walking or climbing stairs? N N  Dressing or bathing? N N  Doing errands, shopping? N N  Preparing Food and eating ? N -  Using the Toilet? N -  In the past six months, have you accidently leaked urine? N -  Do you have problems with loss of bowel control? N -  Managing your Medications? N -  Managing your Finances? N -  Housekeeping or managing your Housekeeping? N -  Some recent data might be hidden    Patient Care Team: Colon Branch, MD as PCP - General (Internal Medicine) Delrae Rend, MD as Consulting Physician (Endocrinology) Noralee Space, MD as Consulting Physician (Pulmonary Disease) Kerin Perna., MD as Consulting Physician (Neurology) Nickie Retort, MD as Consulting Physician (Urology)    Assessment:   This is a routine wellness examination for Ronald King. Physical assessment deferred to PCP.  Exercise Activities and Dietary recommendations Current Exercise Habits: Structured exercise class, Type of exercise: strength training/weights, Time (Minutes): 30, Frequency (Times/Week): 3, Weekly Exercise (Minutes/Week): 90, Intensity: Mild, Exercise limited by: None identified Diet (meal preparation, eat out, water intake, caffeinated beverages, dairy products, fruits and vegetables): well balanced   Goals    . Stay active       Fall Risk Fall Risk  08/08/2017 08/08/2017 07/29/2016  09/24/2015 09/12/2014  Falls in the past year? No No No Yes No  Number falls in past yr: - - - 1 -  Injury with Fall? - - - No -  Comment - - - Pt fell off a curb walking to get his new CPAP -    Depression Screen PHQ 2/9 Scores 08/08/2017 08/08/2017 07/29/2016 09/24/2015  PHQ - 2 Score 0 0 0 0     Cognitive Function MMSE - Mini Mental State Exam 09/24/2015  Orientation to time 5  Orientation to Place 5  Registration 3  Attention/ Calculation 5  Recall 3  Language- name 2 objects 2  Language- repeat 1  Language- follow 3 step command 3  Language- read & follow direction 1  Write a sentence 1  Copy design 1  Total score 30        Immunization History  Administered Date(s) Administered  . Influenza Split 01/28/2011, 12/16/2011  . Influenza Whole 12/25/2008  . Influenza, High Dose Seasonal PF 01/09/2015, 12/13/2016  . Influenza,inj,Quad PF,6+ Mos 01/10/2014  . Influenza-Unspecified 11/27/2012  . Pneumococcal Conjugate-13 09/06/2013  . Pneumococcal Polysaccharide-23 12/16/2011  . Td 09/24/2015  . Tdap 03/29/2005  . Zoster 12/15/2012   Screening Tests Health Maintenance  Topic Date Due  . OPHTHALMOLOGY EXAM  10/19/2016  . HEMOGLOBIN A1C  12/18/2016  . FOOT EXAM  07/29/2017  . URINE MICROALBUMIN  07/29/2017  . INFLUENZA VACCINE  10/27/2017  . COLONOSCOPY  08/31/2018  . TETANUS/TDAP  09/23/2025  . Hepatitis C Screening  Completed  . PNA vac Low Risk Adult  Completed      Plan:  Follow up with PCP as directed  Continue to eat heart  healthy diet (full of fruits, vegetables, whole grains, lean protein, water--limit salt, fat, and sugar intake) and increase physical activity as tolerated.  Continue doing brain stimulating activities (puzzles, reading, adult coloring books, staying active) to keep memory sharp.   Bring a copy of your living will and/or healthcare power of attorney to your next office visit.   I have personally reviewed and noted the following in the  patient's chart:   . Medical and social history . Use of alcohol, tobacco or illicit drugs  . Current medications and supplements . Functional ability and status . Nutritional status . Physical activity . Advanced directives . List of other physicians . Hospitalizations, surgeries, and ER visits in previous 12 months . Vitals . Screenings to include cognitive, depression, and falls . Referrals and appointments  In addition, I have reviewed and discussed with patient certain preventive protocols, quality metrics, and best practice recommendations. A written personalized care plan for preventive services as well as general preventive health recommendations were provided to patient.     Naaman Plummer [place], [place]  08/08/2017  Kathlene November, MD

## 2017-08-08 ENCOUNTER — Encounter: Payer: Self-pay | Admitting: *Deleted

## 2017-08-08 ENCOUNTER — Encounter: Payer: Self-pay | Admitting: Internal Medicine

## 2017-08-08 ENCOUNTER — Ambulatory Visit (INDEPENDENT_AMBULATORY_CARE_PROVIDER_SITE_OTHER): Payer: Medicare Other | Admitting: Internal Medicine

## 2017-08-08 ENCOUNTER — Ambulatory Visit (INDEPENDENT_AMBULATORY_CARE_PROVIDER_SITE_OTHER): Payer: Medicare Other | Admitting: *Deleted

## 2017-08-08 VITALS — BP 144/68 | HR 77 | Temp 97.4°F | Resp 14 | Ht 76.0 in | Wt 277.2 lb

## 2017-08-08 VITALS — BP 144/68 | HR 77 | Ht 76.0 in | Wt 277.0 lb

## 2017-08-08 DIAGNOSIS — E559 Vitamin D deficiency, unspecified: Secondary | ICD-10-CM | POA: Diagnosis not present

## 2017-08-08 DIAGNOSIS — Z0001 Encounter for general adult medical examination with abnormal findings: Secondary | ICD-10-CM

## 2017-08-08 DIAGNOSIS — D72829 Elevated white blood cell count, unspecified: Secondary | ICD-10-CM

## 2017-08-08 DIAGNOSIS — G43909 Migraine, unspecified, not intractable, without status migrainosus: Secondary | ICD-10-CM | POA: Diagnosis not present

## 2017-08-08 DIAGNOSIS — Z0000 Encounter for general adult medical examination without abnormal findings: Secondary | ICD-10-CM

## 2017-08-08 DIAGNOSIS — E119 Type 2 diabetes mellitus without complications: Secondary | ICD-10-CM

## 2017-08-08 DIAGNOSIS — R0789 Other chest pain: Secondary | ICD-10-CM | POA: Diagnosis not present

## 2017-08-08 DIAGNOSIS — R05 Cough: Secondary | ICD-10-CM | POA: Diagnosis not present

## 2017-08-08 DIAGNOSIS — E291 Testicular hypofunction: Secondary | ICD-10-CM | POA: Diagnosis not present

## 2017-08-08 DIAGNOSIS — G629 Polyneuropathy, unspecified: Secondary | ICD-10-CM

## 2017-08-08 DIAGNOSIS — K219 Gastro-esophageal reflux disease without esophagitis: Secondary | ICD-10-CM

## 2017-08-08 DIAGNOSIS — E785 Hyperlipidemia, unspecified: Secondary | ICD-10-CM

## 2017-08-08 NOTE — Patient Instructions (Signed)
   GO TO THE FRONT DESK Schedule your next appointment for a checkup in 6 months  Schedule labs to be done fasting this week

## 2017-08-08 NOTE — Patient Instructions (Signed)
Follow up with PCP as directed  Continue to eat heart healthy diet (full of fruits, vegetables, whole grains, lean protein, water--limit salt, fat, and sugar intake) and increase physical activity as tolerated.  Continue doing brain stimulating activities (puzzles, reading, adult coloring books, staying active) to keep memory sharp.   Bring a copy of your living will and/or healthcare power of attorney to your next office visit.

## 2017-08-08 NOTE — Assessment & Plan Note (Addendum)
--  Td 2017  ; Pneumonia shot 2013; prevnar 2015; zostavax-- 11-2012;  shingrex not available --CCS: Cscope 08-2008, neg, next in 10 years, was rec hemocult q 2-3 years, Neg hemocult  09-2013 , 01-2015,02-2016; declined iFOB today 08/08/17 --h/o elevated PSA, f/u by urology,reports PSA was elevated last year but subsequently it improved, did not need a BX --rec to stay active and eat healthy --EKG: NSR --Labs: Come back fasting for a CMP, FLP, vitamin D and CBC

## 2017-08-08 NOTE — Progress Notes (Signed)
Pre visit review using our clinic review tool, if applicable. No additional management support is needed unless otherwise documented below in the visit note. 

## 2017-08-08 NOTE — Assessment & Plan Note (Signed)
DM: Per endocrinology Hypogonadism: Since last year, PSA was elevated, HRT discontinue, patient reports he has seen urology, because the PSA normalized no biopsy was pursued HTN: BP today 144/68, at home is in the 130s.  Currently on amlodipine.  No change Increased WBC:: Recently, endocrinology check a CBC and WBC was slightly elevated, he self started amoxicillin b/c he assumed he has a infex and he has a h/o prostatitis.  I advised against the use of antibiotics randomly.  If he feels he has infection needs to see the appropriate doctor ( urology if he suspects prostatitis)   Dyslipidemia: Currently on Crestor and TriCor. Cough, chronic: Sees Dr. Lenna Gilford Neuropathy, RLS, migraines  Per  neurology note 04/29/2017: Neuropathy, has not tolerated Cymbalta or gabapentin.  Not interested in other options. Also has RLS type of sx, declined need to stop antidepressants and they may make RLS worse. Episodic migraines: Did not desire any further treatment GERD: Had a EGD 02/23/2017, essentially normal consistent with previous Nissen fundoplication surgery Vitamin D deficiency: Checking labs Chest pain: As described above, very atypical, EKG no acute changes.  Recommend to call if pattern changes, ER if severe pain. RTC 6 months

## 2017-08-08 NOTE — Progress Notes (Signed)
Subjective:    Patient ID: Ronald King, male    DOB: 1945-12-09, 72 y.o.   MRN: [id number]  DOS:  08/08/2017 Type of visit - description : cpx Interval history: Since her last visit, has seen several doctors, notes reviewed.   Review of Systems Denies nausea, vomiting.  No diarrhea or blood in the stools No shortness of breath Reports that for a while and only from time to time she has soreness at the anterior chest, lasts 10 seconds, never exertional, not associated with diaphoresis, nausea or presyncope feeling. He thinks is GERD because sometimes soreness feels like burning.  Other than above, a 14 point review of systems is negative      Past Medical History:  Diagnosis Date  . Anemia   . Anxiety   . Chronic bronchitis   . Chronic cough   . Diabetes ([place]) 07-2011   per Dr Buddy Duty  . DJD (degenerative joint disease)   . Dyslipidemia   . Esophageal polyp   . GERD (gastroesophageal reflux disease)    s/p Nissan F.  . Hemorrhoids, internal   . History of gastrointestinal hemorrhage   . Hypertension   . Hypogonadism, male    per Dr Buddy Duty  . Neuromuscular disorder (HCC)    neuropathy feet  . Neuropathy    per neuro-Dr Ferrarou vicodin  . OSA (obstructive sleep apnea)    on CPAP  . Sleep apnea    CPAP at night  . Vitamin D deficiency     Past Surgical History:  Procedure Laterality Date  . ANAL FISSURE REPAIR    . hand fracture surgery     left  . KNEE ARTHROSCOPY  2007   Dr. Marlou Sa  . LAPAROSCOPIC NISSEN FUNDOPLICATION  08/1441   Dr. Hassell Done  . NOSE SURGERY    . TONSILLECTOMY      Social History   Socioeconomic History  . Marital status: Married    Spouse name: Not on file  . Number of children: 1  . Years of education: Not on file  . Highest education level: Not on file  Occupational History  . Occupation: Technical brewer, retired    Comment: retired  . 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  Financial resource strain: Not on file  . Food insecurity:    Worry: Not on file    Inability: Not on file  . Transportation needs:    Medical: Not on file    Non-medical: Not on file  Tobacco Use  . Smoking status: Former Smoker    Packs/day: 1.00    Years: 3.00    Pack years: 3.00    Last attempt to quit: 03/29/1965    Years since quitting: 52.3  . Smokeless tobacco: Never Used  Substance and Sexual Activity  . Alcohol use: No  . Drug use: No  . Sexual activity: Not on file  Lifestyle  . Physical activity:    Days per week: Not on file    Minutes per session: Not on file  . Stress: Not on file  Relationships  . Social connections:    Talks on phone: Not on file    Gets together: Not on file    Attends religious service: Not on file    Active member of club or organization: Not on file    Attends meetings of clubs or organizations: Not on file    Relationship status: Not on file  .  Intimate partner violence:    Fear of current or ex partner: Not on file    Emotionally abused: Not on file    Physically abused: Not on file    Forced sexual activity: Not on file  Other Topics Concern  . Not on file  Social History Narrative   Lives w/ wife     Family History  Problem Relation Age of Onset  . Cerebral aneurysm Father   . Alcohol abuse Father   . Neuropathy Mother   . Heart disease Mother        died at age 32  . Migraines Brother        several  . Diabetes Maternal Uncle   . CAD Brother        CABG in his 68s  . Colon cancer Neg Hx   . Prostate cancer Neg Hx      Allergies as of 08/08/2017      Reactions   Montelukast Shortness Of Breath   Ceftin [cefuroxime Axetil]    Lyrica [pregabalin] Other (See Comments)   Drowsiness   Neurontin [gabapentin] Other (See Comments)   Drowsiness    Singulair [montelukast Sodium]       Medication List        Accurate as of 08/08/17  9:32 PM. Always use your most recent med list.          amLODipine 10 MG  tablet Commonly known as:  NORVASC Take 1 tablet (10 mg total) by mouth daily.   amoxicillin 500 MG tablet Commonly known as:  AMOXIL Take 1 tablet (500 mg total) by mouth 3 (three) times daily. Take as directed   ascorbic acid 500 MG tablet Commonly known as:  VITAMIN C Take 1,000 mg by mouth daily.   aspirin 81 MG tablet Take 81 mg by mouth daily.   B-12 2000 MCG Tabs Take 1 tablet by mouth daily.   CENTRUM SILVER PO Take 1 tablet by mouth daily. Reported on 03/17/2015   clonazePAM 1 MG tablet Commonly known as:  KLONOPIN Take 1 mg by mouth 2 (two) times daily as needed.   CO Q 10 PO Take 1 tablet by mouth daily.   fenofibrate 145 MG tablet Commonly known as:  TRICOR Take 1 tablet (145 mg total) by mouth daily.   HYDROcodone-homatropine 5-1.5 MG/5ML syrup Commonly known as:  HYCODAN Take 5 mLs by mouth every 6 (six) hours as needed for cough.   lansoprazole 30 MG capsule Commonly known as:  PREVACID Take 1 capsule (30 mg total) by mouth 2 (two) times daily before a meal.   PARoxetine 20 MG tablet Commonly known as:  PAXIL Take 20 mg by mouth daily.   pyridOXINE 100 MG tablet Commonly known as:  VITAMIN B-6 Take 100 mg by mouth daily.   RA CALCIUM PLUS VITAMIN D 600-400 MG-UNIT tablet Generic drug:  Calcium Carbonate-Vitamin D Take 1 tablet by mouth daily.   rosuvastatin 20 MG tablet Commonly known as:  CRESTOR Take 1 tablet (20 mg total) by mouth every other day.   Vitamin D 2000 units tablet Take 2,000 Units by mouth daily.          Objective:   Physical Exam BP (!) 144/68 (BP Location: Left Arm, Patient Position: Sitting, Cuff Size: Normal)   Pulse 77   Temp (!) 97.4 F (36.3 C) (Oral)   Resp 14   Ht 6\' 4"  (1.93 m)   Wt 277 lb 4 oz (125.8 kg)  SpO2 93%   BMI 33.75 kg/m  General:   Well developed, obese appearing. NAD.  Neck: No  thyromegaly  HEENT:  Normocephalic . Face symmetric, atraumatic Lungs:  Few  rhonchi with cough. Normal  respiratory effort, no intercostal retractions, no accessory muscle use. Heart: RRR,  no murmur.  No pretibial edema bilaterally  Abdomen:  Not distended, soft, non-tender. No rebound or rigidity.   Skin: Exposed areas without rash. Not pale. Not jaundice Neurologic:  alert & oriented X3.  Speech normal, gait appropriate for age and unassisted Strength symmetric and appropriate for age.  Psych: Cognition and judgment appear intact.  Cooperative with normal attention span and concentration.  Behavior appropriate. No anxious or depressed appearing.     Assessment & Plan:  Assessment  ENDO: Dr Buddy Duty -DM - hypogonadism  HTN Dislipidemia NEURO: --+ Neuropathy , Dr Tonia Ghent, on Vicodin --RLS type of symptoms Obesity, BMI 33 Anxiety -- paxil- clonazepam (per Dr Tye Savoy) Vitamin D deficiency PULM:  --OSA, CPAP --Chronic cough-- Dr Lenna Gilford DJD, Dr. Marlou Sa GU: Dr Erenest Rasher BPH, ED   GI: --GERD, status post Nissen fundoplication --Esophageal polyp -- h/o GI bleed- tranfussion (90s)  PLAN: DM: Per endocrinology Hypogonadism: Since last year, PSA was elevated, HRT discontinue, patient reports he has seen urology, because the PSA normalized no biopsy was pursued HTN: BP today 144/68, at home is in the 130s.  Currently on amlodipine.  No change Increased WBC:: Recently, endocrinology check a CBC and WBC was slightly elevated, he self started amoxicillin b/c he assumed he has a infex and he has a h/o prostatitis.  I advised against the use of antibiotics randomly.  If he feels he has infection needs to see the appropriate doctor ( urology if he suspects prostatitis)   Dyslipidemia: Currently on Crestor and TriCor. Cough, chronic: Sees Dr. Lenna Gilford Neuropathy, RLS, migraines  Per  neurology note 04/29/2017: Neuropathy, has not tolerated Cymbalta or gabapentin.  Not interested in other options. Also has RLS type of sx, declined need to stop antidepressants and they may make RLS worse. Episodic  migraines: Did not desire any further treatment GERD: Had a EGD 02/23/2017, essentially normal consistent with previous Nissen fundoplication surgery Vitamin D deficiency: Checking labs Chest pain: As described above, very atypical, EKG no acute changes.  Recommend to call if pattern changes, ER if severe pain. RTC 6 months

## 2017-08-11 ENCOUNTER — Other Ambulatory Visit (INDEPENDENT_AMBULATORY_CARE_PROVIDER_SITE_OTHER): Payer: Medicare Other

## 2017-08-11 DIAGNOSIS — E559 Vitamin D deficiency, unspecified: Secondary | ICD-10-CM

## 2017-08-11 DIAGNOSIS — Z0000 Encounter for general adult medical examination without abnormal findings: Secondary | ICD-10-CM

## 2017-08-11 DIAGNOSIS — E785 Hyperlipidemia, unspecified: Secondary | ICD-10-CM

## 2017-08-11 LAB — CBC WITH DIFFERENTIAL/PLATELET
Basophils Absolute: 0 10*3/uL (ref 0.0–0.1)
Basophils Relative: 0.6 % (ref 0.0–3.0)
Eosinophils Absolute: 0.3 10*3/uL (ref 0.0–0.7)
Eosinophils Relative: 4.1 % (ref 0.0–5.0)
HCT: 44.1 % (ref 39.0–52.0)
Hemoglobin: 15.1 g/dL (ref 13.0–17.0)
Lymphocytes Relative: 21.6 % (ref 12.0–46.0)
Lymphs Abs: 1.5 10*3/uL (ref 0.7–4.0)
MCHC: 34.2 g/dL (ref 30.0–36.0)
MCV: 88.8 fl (ref 78.0–100.0)
Monocytes Absolute: 0.5 10*3/uL (ref 0.1–1.0)
Monocytes Relative: 7.7 % (ref 3.0–12.0)
Neutro Abs: 4.5 10*3/uL (ref 1.4–7.7)
Neutrophils Relative %: 66 % (ref 43.0–77.0)
Platelets: 236 10*3/uL (ref 150.0–400.0)
RBC: 4.97 Mil/uL (ref 4.22–5.81)
RDW: 13.7 % (ref 11.5–15.5)
WBC: 6.8 10*3/uL (ref 4.0–10.5)

## 2017-08-11 LAB — LIPID PANEL
CHOL/HDL RATIO: 4
Cholesterol: 161 mg/dL (ref 0–200)
HDL: 37.9 mg/dL — ABNORMAL LOW (ref >39.00)
LDL CALC: 99 mg/dL (ref 0–99)
NONHDL: 123.47
TRIGLYCERIDES: 121 mg/dL (ref 0.0–149.0)
VLDL: 24.2 mg/dL (ref 0.0–40.0)

## 2017-08-11 LAB — COMPREHENSIVE METABOLIC PANEL
ALT: 20 U/L (ref 0–53)
AST: 19 U/L (ref 0–37)
Albumin: 4.1 g/dL (ref 3.5–5.2)
Alkaline Phosphatase: 45 U/L (ref 39–117)
BUN: 15 mg/dL (ref 6–23)
CALCIUM: 9.1 mg/dL (ref 8.4–10.5)
CHLORIDE: 105 meq/L (ref 96–112)
CO2: 32 meq/L (ref 19–32)
Creatinine, Ser: 1.1 mg/dL (ref 0.40–1.50)
GFR: 70.15 mL/min (ref >60.00)
GLUCOSE: 128 mg/dL — AB (ref 70–99)
Potassium: 4.3 mEq/L (ref 3.5–5.1)
SODIUM: 142 meq/L (ref 135–145)
Total Bilirubin: 0.6 mg/dL (ref 0.2–1.2)
Total Protein: 6.4 g/dL (ref 6.0–8.3)

## 2017-08-14 LAB — VITAMIN D 1,25 DIHYDROXY
Vitamin D 1, 25 (OH)2 Total: 70 pg/mL (ref 18–72)
Vitamin D3 1, 25 (OH)2: 70 pg/mL

## 2017-08-18 LAB — HM DIABETES EYE EXAM

## 2017-09-01 ENCOUNTER — Telehealth: Payer: Self-pay | Admitting: Pulmonary Disease

## 2017-09-01 MED ORDER — HYDROCODONE-HOMATROPINE 5-1.5 MG/5ML PO SYRP: 5.0000 mL | ORAL_SOLUTION | Freq: Four times a day (QID) | ORAL | 0 refills | Status: DC | PRN

## 2017-09-01 NOTE — Telephone Encounter (Signed)
Called and spoke with patient, he states that he is having coughing again. Patient states that it is non productive. He feels as though his left lung is burning. Patient denies any other symptoms. Patient is requesting a refill of the cough syrup.   Patient was last seen on 3.14.19 with refill on the cough syrup at that time.  CY  Please advise on this, thank you.    Current Outpatient Medications on File Prior to Visit  Medication Sig Dispense Refill  . amLODipine (NORVASC) 10 MG tablet Take 1 tablet (10 mg total) by mouth daily. 90 tablet 3  . amoxicillin (AMOXIL) 500 MG tablet Take 1 tablet (500 mg total) by mouth 3 (three) times daily. Take as directed (Patient not taking: Reported on 08/08/2017) 30 tablet 1  . ascorbic acid (VITAMIN C) 500 MG tablet Take 1,000 mg by mouth daily.     Marland Kitchen aspirin 81 MG tablet Take 81 mg by mouth daily.      . Calcium Carbonate-Vitamin D (RA CALCIUM PLUS VITAMIN D) 600-400 MG-UNIT per tablet Take 1 tablet by mouth daily.      . Cholecalciferol (VITAMIN D) 2000 UNITS tablet Take 2,000 Units by mouth daily.      . clonazePAM (KLONOPIN) 1 MG tablet Take 1 mg by mouth 2 (two) times daily as needed.      . Coenzyme Q10 (CO Q 10 PO) Take 1 tablet by mouth daily.     . Cyanocobalamin (B-12) 2000 MCG TABS Take 1 tablet by mouth daily.    . fenofibrate (TRICOR) 145 MG tablet Take 1 tablet (145 mg total) by mouth daily. 90 tablet 0  . HYDROcodone-homatropine (HYCODAN) 5-1.5 MG/5ML syrup Take 5 mLs by mouth every 6 (six) hours as needed for cough. (Patient not taking: Reported on 08/08/2017) 480 mL 0  . lansoprazole (PREVACID) 30 MG capsule Take 1 capsule (30 mg total) by mouth 2 (two) times daily before a meal. 60 capsule 3  . Multiple Vitamins-Minerals (CENTRUM SILVER PO) Take 1 tablet by mouth daily. Reported on 03/17/2015    . PARoxetine (PAXIL) 20 MG tablet Take 20 mg by mouth daily.    Marland Kitchen pyridOXINE (VITAMIN B-6) 100 MG tablet Take 100 mg by mouth daily.    .  rosuvastatin (CRESTOR) 20 MG tablet Take 1 tablet (20 mg total) by mouth every other day. 45 tablet 0   No current facility-administered medications on file prior to visit.    Allergies  Allergen Reactions  . Montelukast Shortness Of Breath  . Ceftin [Cefuroxime Axetil]   . Lyrica [Pregabalin] Other (See Comments)    Drowsiness  . Neurontin [Gabapentin] Other (See Comments)    Drowsiness   . Singulair [Montelukast Sodium]

## 2017-09-01 NOTE — Telephone Encounter (Signed)
Rx for Hycodan has been placed up front in brown folder for pick up. Pt is aware and voiced his understanding. Nothing further is needed.

## 2017-09-01 NOTE — Telephone Encounter (Signed)
Ok to refill cough med this time. He will need to f/u with Dr Lenna Gilford as planned.

## 2017-09-16 ENCOUNTER — Other Ambulatory Visit: Payer: Self-pay | Admitting: Gastroenterology

## 2017-10-06 ENCOUNTER — Other Ambulatory Visit: Payer: Self-pay | Admitting: Internal Medicine

## 2017-10-24 LAB — TSH: TSH: 2.29 (ref <=5.90)

## 2017-10-27 ENCOUNTER — Other Ambulatory Visit: Payer: Self-pay | Admitting: Pulmonary Disease

## 2017-12-06 ENCOUNTER — Ambulatory Visit (INDEPENDENT_AMBULATORY_CARE_PROVIDER_SITE_OTHER)
Admission: RE | Admit: 2017-12-06 | Discharge: 2017-12-06 | Disposition: A | Discharge status: Home / Self Care | Payer: Medicare Other | Source: Ambulatory Visit | Attending: Pulmonary Disease | Admitting: Pulmonary Disease

## 2017-12-06 ENCOUNTER — Encounter: Payer: Self-pay | Admitting: Pulmonary Disease

## 2017-12-06 ENCOUNTER — Ambulatory Visit (INDEPENDENT_AMBULATORY_CARE_PROVIDER_SITE_OTHER): Payer: Medicare Other | Admitting: Pulmonary Disease

## 2017-12-06 VITALS — BP 136/80 | HR 81 | Temp 98.7°F | Ht 72.0 in | Wt 279.8 lb

## 2017-12-06 DIAGNOSIS — R05 Cough: Secondary | ICD-10-CM | POA: Diagnosis not present

## 2017-12-06 DIAGNOSIS — G4733 Obstructive sleep apnea (adult) (pediatric): Secondary | ICD-10-CM

## 2017-12-06 DIAGNOSIS — R053 Chronic cough: Secondary | ICD-10-CM

## 2017-12-06 DIAGNOSIS — K219 Gastro-esophageal reflux disease without esophagitis: Secondary | ICD-10-CM

## 2017-12-06 DIAGNOSIS — I10 Essential (primary) hypertension: Secondary | ICD-10-CM

## 2017-12-06 DIAGNOSIS — Z6836 Body mass index (BMI) 36.0-36.9, adult: Secondary | ICD-10-CM

## 2017-12-06 MED ORDER — HYDROCODONE-HOMATROPINE 5-1.5 MG/5ML PO SYRP: 5.0000 mL | ORAL_SOLUTION | Freq: Four times a day (QID) | ORAL | 0 refills | Status: DC | PRN

## 2017-12-06 MED ORDER — AMOXICILLIN 500 MG PO TABS: 500.0000 mg | ORAL_TABLET | Freq: Three times a day (TID) | ORAL | 0 refills | Status: DC

## 2017-12-06 NOTE — Patient Instructions (Signed)
Today we updated your med list in our EPIC system...    Continue your current medications the same...  Today we did a follow up CXR...    We will contact you w/ the results when available...   We refilled your AMOXICILLIN & HYDROMET cough syrup per request...  We discussed taking MUCINEX 1200mg  twice daily (or the generic MUCUS RELIEF- 400mg  tabs taking 2 tabs three times daily w/ extra water)...  Remember to also apply the vigorous ANTIREFLUX REGIMEN>>    Take the Prevacid 30mg  twice daily (30 min before breakfast & dinner each day)    Do not eat or drink after dinner in the eve...    Keep the head of your bed elevated 6" or more...  Continue your CPAP nightly...  Call for any questions or if we can be of service in any way...  I will send a note to DrPaz & ask him to take over writing the Amox & cough syrup for you...  We will arrange for a SLEEP MED follow up w/ Dr Halford Chessman in 6 months.Marland KitchenMarland Kitchen

## 2017-12-06 NOTE — Progress Notes (Signed)
Subjective:    Patient ID: Ronald King, male    DOB: July 02, 1945, 72 y.o.   MRN: [id number]  HPI 72 y/o WM here for a follow up visit... he has a chronic cough x years and is dependent on codeine containing cough syrups... he was miserable when ENDAL HD went off the market and we had to substitute PHENERGAN EXPECTORANT w/ CODEINE - 1 tsp by mouth Q6H as needed for cough, limiting him to 1 pint per month; then his insurance company refused the Phenergan Expect & switched to [facility]... His new Primary Care doctor is DrPaz. ~  SEE PREV EPIC NOTES FOR OLDER DATA >>     PFT 11/2011 were essentially wnl w/ FVC=3.98 (76%), FEV1=3.46 (86%), %1sec=87, mid-flows are wnl at 112% predicted...   July 09, 2013:  66mo ROV and Ronald King has established primary care w/ DrPaz> here today for Pulm follow up & refill of his cough syrup...  CXR 4/15 showed norm heart size, clear lungs, NAD.Marland Kitchen.   ~  July 11, 2014:  41mo ROV & Ronald King reports a stable interval- his CC is left hip pain & DrPaz has rec Ortho eval (currently pending); His breathing is OK, chr cough w/o change, occurs in paroxysms, and he denies recent bronchitic infections etc;  He notes some SOB/DOE w/ yard work, but exercises in the gym 3d/wk ([employer]) & he keeps up well... He is followed by DrPaz for Primary Care, DrKerr for Endocrine, DrFeraru for Neurology, and DrSanders for Psyche...     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    Chronic Cough> managed for yrs w/ cough syrup, now Hycodan 1pt per month- 1tsp Q6h prn cough; cough remains dry- he denies sput, hemoptysis, SOB, CP, etc...    Hx upper airway resistance syndrome> prev eval by DrClance- last seen 7/13- responded very well to CPAP despite a fairly neg Sleep Study in 1999; he continues on CPAP nightly & resting well, excellent daytime alertness, etc...    GERD> on Nexium40Bid +  vigorous antireflux regimen (elev HOB 6", npo after dinner, etc); we reviewed LPR problems; he had Nissen fundoplication in 5462, last EGD was 2010 by DrDBrodie- no acute changes...    Obese> his weight is up 3# to 280 lbs now- BMI= 36 and we reviewed diet, exercise, wt reduction strategies...  We reviewed prob list, meds, xrays and labs> see below for updates >> meds refilled per request...   LABS 3/16 by DrPaz> FLP- at goals;  Chems- ok x BS=121, A1c=6.3;  CBC- wnl...  CXR 06/2104 showed borderline heart size, clear lungs, NAD- DJD in Tspine...  (I personally reviewed the images in Epic)   ~  January 16, 2015:  62mo ROV & Ronald King states "my lungs are acting up this month" c/o incr cough, chest congestion, but denies sput/ CP/ reflux or burning; he denies SOB & states his chr DOE is stable, no change; He wants his hydrocodone cough syrup refilled;  He notes that he was getting tired w/ DOE at [place] started on Testos shots w/ improvement & feeling good now he says...    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    Chronic Cough> managed for yrs w/ cough syrup, now Hycodan 1pt per month- 1tsp  Q6h prn cough; cough remains dry- he denies sput, hemoptysis, SOB, CP, etc...    Hx upper airway resistance syndrome> prev eval by DrClance- last seen 7/13- responded very well to CPAP despite a fairly neg Sleep Study in 1999; he continues on CPAP nightly & resting well, excellent daytime alertness, etc...    GERD> on Nexium40Bid + vigorous antireflux regimen (elev HOB 6", npo after dinner, etc); we reviewed LPR problems; he had Nissen fundoplication in 1062, last EGD was 2010 by DrDBrodie- no acute changes...    Obese> his weight is down 3# to 277 lbs now- BMI= 36 and we reviewed diet, exercise, wt reduction strategies...     Hyperlipidemia>  On Crestor20 and Tricor145 => note Feno145 was started in 2010 when  TG was >400 & Cres20 added by DrPaz in 2014; suggest that he stop the Fenofibrate at this time.    Low-T>  He is on Depo Testos 100mg  Q2wks per DrKerr, Endocrine (seen 11/2014 w/ Low-T, prediabetes, low bone mass & low VitD- note reviewed)    Dysthymia>  On Paxil-CR 25 and Klonopin 1mg  Bid... We reviewed prob list, meds, xrays and labs> see below for updates >> meds refilled per request, his PCP is DrPaz... IMP/PLAN>>  We discussed adding the MUCINEX 1200mg  bid + fluids;  We refilled his HYCODAN 1pt- 1tsp Q6H prn cough...  ~  Aug 07, 2015:  35mo ROV & Ronald King was seen by TP 05/26/15 for his OSA/upper airway resistance syndrome on CPAP16 x yrs w/ prev eval by DrClance (last seen 2013);  He needed a new CPAP machine & wanted the Dream machine=> this was arranged via [facility] & he is quite satisfied;  He has returned on this occais for a CPAP download to meet medicare criteria for on-going payment for his CPAP;  He is resting well, wakes refreshed in the AM 7 denies sleep pressure during the day, energy is satis, no daytime hypersomnolence, etc...  CPAP download required him to bring his machine into the office and it provides the following data for April-May2017> used 30/30 days, ave usage 8H/d, AHI=5.5 on his CPAP16, he did have 16% in leak & is working w/ a new mask at present...  EXAM shows Afeb, VSS, O2sat=94% on RA at rest; Wt=276#, 6'2"Tall, BMI=35;  HEENT- neg- mallampati3;  Chest- decr BS bilat, clear w/o w/r/r;  Heart- RR w/o m/r/g;  Abd- obese, soft, nontender;  Ext- neg w/o c/c/e;  Neuro- intact... IMP/PLAN>>  Ronald King is stable on his CPAP 16 & doing well w/ his new machine, good compliance, tolerated well; Rec to continue same...   ~  December 11, 2015>  55mo ROV & Ronald King is +-stable, c/o rib pain (recently on the right), chr cough as before, "spasms and pain" goes across his back; "My breathing doesn't want to quit hurting", notes bilat pain in ribs c/w an AtypCWP... 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    Chronic Cough> managed for yrs w/ cough syrup, now Hycodan 1pt per month- 1tsp Q6h prn cough; cough remains dry- he denies sput, hemoptysis, SOB, etc...    Hx upper airway resistance syndrome> prev eval by DrClance- last seen 7/13- responded very well to CPAP despite a fairly neg Sleep Study in 1999; he continues on CPAP nightly & resting well,  excellent daytime alertness, etc; we arranged a new machine for him 08/2015 via [facility] & new mask has helped decr the amt of leak...    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 We discussed Rx for this CWP w/ rest/ heat/ lidocaine patches/ & we will add BACLOFEN 10mg  Tid; I indicated next step is prob Pain clinic referral...    Medical issues> followed by DrPaz & DrKerr EXAM shows Afeb, VSS, O2sat=96% on RA at rest; Wt=277#, 6'2"Tall, BMI=35;  HEENT- neg- mallampati3;  Chest- decr BS bilat, clear w/o w/r/r;  Heart- RR w/o m/r/g;  Abd- obese, soft, nontender;  Ext- neg w/o c/c/e;  Neuro- intact...  CXR 12/11/15>  Norm heart size, atherosclerotic changes in Ao, clear lungs- NAD, mod thoracic spondylosis... IMP/PLAN>>  Despite his mult somatic complaints Laurance is reasonable stable w/ his chr cough, chr sinus, UARS, and diffuse aches and pains;  We discussed Rx w/ rest/ heat to the chest wall/ topical lidocaine patches/ and he will continue his current meds from DrPaz, DrKerr, & DrGibson; he can use MUCINEX OTC for any phlegm, and we are adding BACLOFEN 10mg  tid for the CWP & back spasms; CXR is clear; Amox & Hycodan refilled per his request, he says he will get the 2017 Flu vaccine at  [facility]...   ~  June 10, 2016:  11m Ronald King tells me that he has had several episodes of bronchitis over the last few months "I take Amox for a few days and it eases up" but I told him to take the whole course of Ab for 7-10 days to knock it out... Currently notes mild cough (esp Qhs), dry- no phlegm, no hemoptysis, chr stable SOB/DOE, still at 276# and says he's exercising 3x/wk (states that he lost to 260#, then regained 15# in 1 day!);      Hx AR, chr sinusitis, chr cough> on Hycodan prn & lim to 1pt/mo; he has Amox500 to keep on hand & uses prn; no pos cultures or pathogens identified...    UARS> on CPAP Qhs, [facility] DME, no issues w/ mask/ pressure/ says he uses it regularly, rests well, wakes refreshed & no daytime hypersomnolence...    HBP, Chr CWP> AtypCWP on Amlod10, BP= 150/70, we reviewed diet/ exercise/ lose wt...     PCP=> DrPaz & last seen 05/27/16-- noted that pt self discontinued his HCT; followed for HBP, HL, DM, Obesity, BPH w/ elev PSA, etc...    Endocrine=> DrKerr followed for pre-DM, Low-T & last seen 12/18/15-- BS=120, A1c=6.3, PSA=3.51, CBC- ok w/ Hg=15.8, Cr=1.16, Testos=373 on shots q2wks...     Neuro=> DrFeraru in [place]-- seen 12/12/15 for leg pain, migraines, LBP, periph neuropathy; she writes for [employer], he has had MRI of Cspine & Lspine & her notes are reviewed... EXAM shows Afeb, VSS, O2sat=96% on RA at rest; Wt=276#, 6'2"Tall, BMI=35;  HEENT- neg- mallampati3;  Chest- decr BS bilat, clear w/o w/r/r;  Heart- RR w/o m/r/g;  Abd- obese, soft, nontender;  Ext- neg w/o c/c/e;  Neuro- intact... IMP/PLAN>>  Tanor is overall stable from the pulmonary standpoint; we refilled his Hycodan for 1 pt / month and refilled the Amoxicillin that he insists that he keep on had; he will call for problems & we plan rov recheck 47mo...  ~  December 16, 2016:  46mo ROV & Ronald King returns to get his Hycodan cough syrup and Amoxicillin prescriptions refilled>  He notes that his cough has increased, worse  esp at nights and after eating; he has intermittent beige sput production, no hemoptysis; he notesDOE w/ exertion- eg mowing but he goes to the gym 3d/wk, CC is "my knees";  He has hx Nissen fundoplication 4431 w/ several f/u EGDs by DrDBrodie;  DrPaz is his PCP=> We reviewed the following interval Epic notes, follow up visits>    He saw ENDOCRINE-DrKerr on 06/17/16>  Followed for prediabetes, hypogonadism, low bone mass; on VitD 2000u/d, Testos200mg /ml- taking 140mg  IM Q3wks, and "diet"; Testos was low at 106, BS=117, A1c=6.4, PSA=3.68, Hg=15.3, Cr=1.14...     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    He saw NEURO-DrFeraru, [place], [place] on 07/26/16>  Hx neuropathy & migraines; also LBP/ neck pain; on Norco5 which she writes for pt; continues to follow Q48mo...    He saw PCP-DrPaz 07/29/16>  Medicare wellness visit, 19 page note reviewed... 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    Chronic Cough> managed for yrs w/ cough syrup, now Hycodan 1pt per month- 1tsp Q6h prn cough; cough remains dry- he denies sput, hemoptysis, SOB, etc...    Hx upper airway resistance syndrome on CPAP> prev eval by DrClance- last seen 7/13- responded very well to CPAP despite a fairly neg Sleep Study in 1999; he continues on CPAP nightly & resting well, excellent daytime alertness, etc; we arranged a new machine for him 08/2015 via [facility] & new mask has helped decr the amt of leak...    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 We discussed Rx for this CWP w/ rest/  heat/ lidocaine patches/ & we will add BACLOFEN 10mg  Tid; I indicated next step is prob Pain clinic referral...    Medical issues> followed by DrPaz & DrKerr EXAM shows Afeb, VSS, O2sat=94% on RA at rest; Wt=281#, 6'2"Tall, BMI=35-6;  HEENT- neg- mallampati3;  Chest- decr BS bilat, clear w/o w/r/r;  Heart- RR w/o m/r/g;  Abd- obese, soft, nontender;  Ext- neg w/o c/c/e;  Neuro- intact...  CXR 12/16/16 (independently reviewed by me in the PACS system) showed norm heart size, clear lungs- NAD, sl pleural thickening, DJD Tspine...  IMP/PLAN>>  Cobi remains stable from the pulm standpoint, he requests refills of his Hycodan (lim to 1pt per month) and Amoxicillin; his history is more suggestive of a reflux component & he is on Nexium40Bid, NPO after dinner, elev HOB 6", and we will refer back to GI for f/u GI eval...   ~  June 09, 2017:  67mo ROV & Ronald King is here for f/u of his chronic cough, recurrent sinusitis, and upper airway resistance syndrome> His PCP is DrPaz ([facility], [place])... Ronald King returns c/o HAs and recurrent sinus infections w/ 2-3 "flair-ups" he says but notes none of them were "full-blown"; he also reports that his left lung feels weak, like "steel-wool" in it & rough feeling;  He  notes occas cough (on & off), intermittent whitish/ beige sput, no hemoptysis, occas chest pains (on & off), +sinus drainage, but no f/c/s (except after his Low-T shots)... We reviewed the following interval medical notes in Epic-EMR>      He saw ENDOCRINE- DrKerr on 12/30/16>  Followed for Low-T, low bone mass, prediabetes; on Testos200mg /ml- getting 140mg  IM Q3wks; Testos level was still low at 134 ([phone]); Chems showed BS=103, A1c=6.4, Cr=1.07, LFTs wnl; CBC showed Hg=15.7.Marland KitchenMarland Kitchen     He saw NEURO- DrMoser in [place] on 01/27/17>  He switched from DrFeraru because she would not write his hydrocodone pain rx anymore; followed for neuropathy pain (small fiber neuropathy) w/ burning 7 numbness in legs, runs in his family,  intol to Gabapentin, Lyrica, & Cymbalta; discomfort limits his ADLs, on Alpraz of anxiety & the Vicodin for pain (not taking regularly);  They wrote for NALTREXONE50 & discussed poss depakote & spinal cord stim...    He saw GI- DrNandigam on 02/10/17>  Chr GERD, s/p Nissen in 2001, chr cough w/ worsening reflux; on Nexium40Bid=> switched to Prev30Bid & EGD done 02/23/17 showing norm esoph, intact Nissen wrap, norm stomach & duodenum, told to continue current meds...  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    Chronic Cough> managed for yrs w/ cough syrup, now Hydromet 1pt per month, max- 1tsp Q6h prn cough; cough remains dry- he denies sput, hemoptysis, SOB, etc...    Hx upper airway resistance syndrome on CPAP> prev eval by DrClance- last seen 7/13- responded very well to CPAP despite a fairly neg Sleep Study in 1999; he continues on CPAP nightly & resting well, excellent daytime alertness, etc; we arranged a new machine for him 08/2015 via [facility] & new mask has helped decr the amt of leak...    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 We discussed Rx for this CWP w/ rest/ heat/ lidocaine patches/ & we tried Baclofen prn as well...    Medical issues> followed by DrPaz & DrKerr EXAM shows Afeb, VSS, O2sat=95% on RA at rest; Wt=278#, 6'2"Tall, BMI=35-6;  HEENT- neg- mallampati3;  Chest- +tender, sl decr BS bilat, clear w/o w/r/r;  Heart- RR w/o m/r/g;  Abd- obese, soft, nontender, +diastasis;  Ext- neg w/o c/c/e;  Neuro- intact...  [facility]- DME in [place]: CPAP download 2/11 - 06/07/17 showed excellent compliance (30/30 days), 8-9H per night, min leak on CPAP 16 w/ AHI recorded as 8/hr...   IMP/PLAN>>  Problems as noted above-- Tauno requests refill of his Hydromet cough syrup & Amoxicillin 500mg  tabs #30 for his next sinus infection;  Asked to continue current meds and follow up w/ DrPaz, DrMoser, DrFeraru... He will cal for any issues in the interim & we plan rov recheck in 74mo...   ~  December 06, 2017:  25mo ROV               Problem List:       Hx of SINUSITIS >> prev on NASONEX 2spBid + he requests Rx for AMOXICILLIN to keep on hand for travel... prev eval DrByers- "he wanted to do surg"... "my sinuses are crazy" but he refuses to consider  the surg... also had dysphonia eval at [facility] in the 90's w/ Botox tried.  OBSTRUCTIVE SLEEP APNEA, UPPER AIRWAY RESISTANCE SYNDROME>> he uses CPAP nightly... saw DrClance 12/09- he felt upper airway resistance syndrome... doing better w/ new CPAP machine in 2010. ~  7/13:  He had f/u DrClance & doing well, no changes made, continue same CPAP... ~  He remains stable on the CPAP, rests satis, no daytime sleepiness issues, etc... ~  08/2015> we contacted [facility] regarding new machine 7 to work w/ him regarding mask fit...  COUGH, CHRONIC >> as above... he had an extensive eval in the past w/ GI, ENT, Pulm, etc... he used ENDAL-HD, then  PHEN EXPECT w/ CODEINE, now HYCODAN 1tsp Q6h prn cough, lim to 1pt/mo (Med choice lim by his insurance). ~  Cough was no diff off ACE/ ARB in past & he does not want to change meds... ~  CXR 1/10 showed clear,  just min biapical pleural thickening... ~  2/11: states intol to Symbicort w/ HAs... try ADVAIR 250Bid... ~  CXR 2/11 showed clear lungs, NAD.Marland Kitchen. ~  CXR 9/12 showed chr bronchitic changes, NAD... ~  PFT 9/13 showed FVC=3.98 (76%), FEV1=3.46 (86%), %1sec=87, mid-flows=112% predicted...  ~  CXR 9/13 showed normal heart size, clear lungs, DJD spine... ~  4/15: his insurance company is requesting change from Phenergan expect w/ cod- we will write for Hycodan one tsp Q6h prn & continue to limit to  1pt/mo... ~  CXR 4/15 showed norm heart size, clear lungs, NAD...  ~  10/15: his insurance has again changed the cough syrup, this time from Phenergan expect w/ cod to [facility] same rx 1tsp Q6h prn cough, lim to 1pt/mo... 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  ~  CXR 12/11/15>  Norm heart size, atherosclerotic changes in Ao, clear lungs- NAD, mod thoracic spondylosis   FOLLOWED FOR PRIMARY CARE BY DrPAZ >>   HYPERTENSION (ICD-401.9) - meds adjusted by DrCrenshaw on NORVASC 10mg /d & HCTZ 25mg - 1/2 tab daily...  ~  8/12:  BP= 134/82 & denies HA, fatigue, visual changes, CP, palipit, dizziness, syncope, dyspnea, edema, etc... ~  3/13:  BP= 124/76 & he denies CP, palpit, SOB, edema... ~  9/13:  BP= 110/66 & he remains asymptomatic... ~  He has established Primary Care f/u w/ DrPaz => on Amlod10, Hct12.5, & off prev Lisin; BP= 122/64 today.  DYSLIPIDEMIA (ICD-272.4) - on TRICOR 145mg /d but refuses Statin meds or Lipid Clinic referral... ~  FLP 1/10 showed TChol 161, TG 239, HDL 30, LDL 72... he wants to control w/ diet. ~  FLP 9/10 showed TChol 192, TG 426, HDL 30, LDL 80... rec> start [id number]. ~  [place] 2/11 showed TChol 173, TG 102, HDL 37, LDL 115... continue Tricor Rx + diet. ~  FLP 2/12 showed TChol 221, TG 249, HDL 31, LDL 140... Reminded to take med regularly & get wt down. ~  FLP 8/12 on Tric145 showed TChol 194, TG 169, HDL 32, LDL 128... Still not at goal, refuses statin med. ~  [place] 3/13 on Tric145 showed TChol 229, TG 177, HDL 35, LDL 140... rec to start CRESTOR20 + better low fat diet! ~  FLP 5/13 on Tric145+Cres20 showed TChol 151, TG 188, HDL 33, LDL 80... Continue same. ~  Lipids followed by DrPaz, now on Cres20 monotherapy => labs reviewed in EPIC...  BORDERLINE DM >> Dx by Lyn Hollingshead, treated w/ diet alone & labs 9/13 showed  BS=91, A1c=6.1  OBESITY (ICD-278.00) - weight = 274#,  6\' 2"  tall = BMI of 35... discussed diet + exercise and the need for  weight reduction both from his overall health status and his OSA... "I moderate what I eat". ~  weight 1/10 = 267# ~  weight 2/11 = 272#... discussed again! ~  Weight 2/12 = 276#... He is unconcerned! ~  Weight 8/12 = 274#... Reviewed diet & exercise needed. ~  Weight 3/13 = 274#... He is NOT trying. ~  Weight 9/13 = 272# ~  Weight 3/14 = 274# ~  Weight 10/14 = 280#  GERD (ICD-530.81) - on NEXIUM 40mg Bid & off the prev hs Reglan... s/p lap nissen 3/01 by DrMartin.... ~  EGD 11/05 showed prev Nissen, +gastritis... ~  EGD 6/10 by DrDBrodie showed functioning Nissen, post-op changes, sessile polyp in distal esoph= benign mucosa.  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 ~  f/u colonoscopy 6/10 by DrDBrodie showed negative x for int hems...  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  Finally started ANDROGEL 5gm packet Qd in 2012 ~  labs 1/10 showed PSA= 1.63 ~  labs 2/11 showed PSA= 1.65,  ~  Labs 1/12 showed Testosterone level = 160-205 ~  Labs 2/12 showed PSA = 1.63, Testosterone level = 553 ~  Labs 8/12 showed Testosterone level = 428... rec continue daily Androgel. ~  Labs 3/13 showed PSA= 1.73, Testosterone level = 185 & states he is using the Androgel Q3rd day, rec to incr to daily. ~  Follow up labs & med adjustments per DrPaz => he referred pt to Endocrine, DrKerr... 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  VITAMIN D DEFICIENCY (ICD-268.9) - on Vit D OTC 2000 u daily... ~  labs 1/10 showed Vit D level = 16... Vit D 2000 u daily started. ~  labs 2/11 showed Vit D level = 34... continue supplement. ~  Labs 8/12 showed Vit D level = 47... continue same.  HEADACHE (ICD-784.0) - Chr daily migraines  in the past w/ prev HA eval by DrFreeman (Botox shots helped in the past)...  NEUROPATHY (ICD-355.9) - c/o burning in legs and eval from Neuro in [facility] & Rx w/ Neurontin + Hydrocodone for pain... this may be part of a familial trait as several relatives have severe periph neuropathy (?hereditary syndrome)- we don't have notes from [facility] neurology. ~  Pt reports that he was intol to Neurontin, Lyrica, & one other ("memory gaps") & he refused Cymbalta trial... ~  3/13:  Reviewed w/ pt> he continues on VICODIN 2.5-500 from DrGibson up to Tid, 90/mo w/ refills from him... ~  Now followed by [facility], DrFeraru & DrGibson on Vits and Hydrocodone 2.5mg  Bid as needed...   ANXIETY (ICD-300.00) - on PAXIL CR 25mg /d & KLONOPIN 1mg  Bid as needed... they were prev perscribed by ?psychiatrist? but he notes "I've been on these for >23yrs"... he still sees DrSanders for these prescriptions...   Past Surgical History:  Procedure Laterality Date  . ANAL FISSURE REPAIR    . hand fracture surgery     left  . KNEE ARTHROSCOPY  2007   Dr. Marlou Sa  . LAPAROSCOPIC NISSEN FUNDOPLICATION  04/252   Dr. Hassell Done  . NOSE SURGERY    .  TONSILLECTOMY      Outpatient Encounter Medications as of 12/06/2017  Medication Sig  . amLODipine (NORVASC) 10 MG tablet TAKE 1 TABLET BY MOUTH ONCE DAILY  . ascorbic acid (VITAMIN C) 500 MG tablet Take 1,000 mg by mouth daily.   Marland Kitchen aspirin 81 MG tablet Take 81 mg by mouth daily.    . Calcium Carbonate-Vitamin D (RA CALCIUM PLUS VITAMIN D) 600-400 MG-UNIT per tablet Take 1 tablet by mouth daily.    . Cholecalciferol (VITAMIN D) 2000 UNITS tablet Take 2,000 Units by mouth daily.    . clonazePAM (KLONOPIN) 1 MG tablet Take 1 mg by mouth 2 (two) times daily as needed.    . Coenzyme Q10 (CO Q 10 PO) Take 1 tablet by mouth daily.   . Cyanocobalamin (B-12) 2000 MCG TABS Take 1 tablet by mouth daily.  . fenofibrate (TRICOR) 145 MG tablet Take 1 tablet (145 mg total) by mouth  daily.  Marland Kitchen HYDROcodone-homatropine (HYCODAN) 5-1.5 MG/5ML syrup Take 5 mLs by mouth every 6 (six) hours as needed for cough.  . lansoprazole (PREVACID) 30 MG capsule TAKE 1 CAPSULE BY MOUTH TWICE A DAY BEFORE A MEAL  . Multiple Vitamins-Minerals (CENTRUM SILVER PO) Take 1 tablet by mouth daily. Reported on 03/17/2015  . PARoxetine (PAXIL) 20 MG tablet Take 20 mg by mouth daily.  Marland Kitchen pyridOXINE (VITAMIN B-6) 100 MG tablet Take 100 mg by mouth daily.  . rosuvastatin (CRESTOR) 20 MG tablet Take 1 tablet (20 mg total) by mouth every other day.  . [DISCONTINUED] HYDROcodone-homatropine (HYCODAN) 5-1.5 MG/5ML syrup Take 5 mLs by mouth every 6 (six) hours as needed for cough.  Marland Kitchen amoxicillin (AMOXIL) 500 MG tablet Take 1 tablet (500 mg total) by mouth 3 (three) times daily. Take as directed  . [DISCONTINUED] amoxicillin (AMOXIL) 500 MG tablet Take 1 tablet (500 mg total) by mouth 3 (three) times daily. Take as directed (Patient not taking: Reported on 12/06/2017)   No facility-administered encounter medications on file as of 12/06/2017.     Allergies  Allergen Reactions  . Montelukast Shortness Of Breath  . Ceftin [Cefuroxime Axetil]   . Lyrica [Pregabalin] Other (See Comments)    Drowsiness  . Neurontin [Gabapentin] Other (See Comments)    Drowsiness   . Singulair [Montelukast Sodium]     Immunization History  Administered Date(s) Administered  . Influenza Split 01/28/2011, 12/16/2011  . Influenza Whole 12/25/2008  . Influenza, High Dose Seasonal PF 01/09/2015, 12/13/2016  . Influenza,inj,Quad PF,6+ Mos 01/10/2014  . Influenza-Unspecified 11/27/2012  . Pneumococcal Conjugate-13 09/06/2013  . Pneumococcal Polysaccharide-23 12/16/2011  . Td 09/24/2015  . Tdap 03/29/2005  . Zoster 12/15/2012  he got the 2018 FLU vaccine at [facility] recently...   Current Medications, Allergies, Past Medical History, Past Surgical History, Family History, and Social History were reviewed in [facility] record.   Review of Systems        See HPI - all other systems neg except as noted...  The patient complains of cough, & dyspnea on exertion.  The patient denies anorexia, fever, weight loss, weight gain, vision loss, decreased hearing, hoarseness, chest pain, syncope, peripheral edema, headaches, hemoptysis, abdominal pain, melena, hematochezia, severe indigestion/heartburn, hematuria, incontinence, muscle weakness, suspicious skin lesions, transient blindness, difficulty walking, depression, unusual weight change, abnormal bleeding, enlarged lymph nodes, and angioedema.     Objective:   Physical Exam      WD, Obese, 72 y/o WM in NAD... GENERAL:  Alert & oriented; pleasant & cooperative.Marland KitchenMarland Kitchen  HEENT:  [place]ian/AT, EOM-wnl, PERRLA, EACs-clear, TMs-wnl, NOSE-clear, THROAT-clear & wnl. NECK:  Supple w/ fairROM; no JVD; normal carotid impulses w/o bruits; no thyromegaly or nodules palpated; no lymphadenopathy. CHEST:  Clear to P & A; without wheezes/ rales/ or rhonchi; he has an intermittent dry irritative cough... HEART:  Regular Rhythm; without murmurs/ rubs/ or gallops. ABDOMEN:  Obese, soft & nontender; normal bowel sounds; no organomegaly or masses detected. EXT: without deformities, mild arthritic changes; no varicose veins/ venous insuffic/ or edema. NEURO:  CN's intact; motor testing normal; no focal neuro deficits DERM:  No lesions noted; no rash etc...  RADIOLOGY DATA:  Reviewed in the EPIC EMR & discussed w/ the patient...  LABORATORY DATA:  Reviewed in the EPIC EMR & discussed w/ the patient...   Assessment & Plan:   12/16/16>   Ronald King remains stable from the pulm standpoint, he requests refills of his Hycodan (lim to 1pt per month) and Amoxicillin; his history is more suggestive of a reflux component & he is on Nexium40Bid, NPO after dinner, elev HOB 6", and we will refer back to GI for f/u GI eval 06/09/17>   Problems as noted above-- Ronald King requests refill of his  Hydromet cough syrup & Amoxicillin 500mg  tabs #30 for his next sinus infection;  Asked to continue current meds and follow up w/ DrPaz, DrMose, DrFeraru... He will cal for any issues in the interim & we plan rov recheck in 11mo   OSA, UARS>  He uses CPAP nightly, continue same...  Chronic Cough>  He is quite dependent on the cough syrup, limited to one pint per month; doesn't want to change meds but insurance won't fill the Phenergan expectorant; change to Hycodan- 1 tsp Q8h prn...  Atyp CWP>  We discussed rest the chest/ apply heat/ topical lidocaine patches/ etc;  We added BACLOFEN 10mg  tid for back spasms...   MEDICAL PROBLEMS FOLLOWED by Dahlia Bailiff, DrFERARU >>   HBP>  Controlled on CCB & Diuretic now, off prev ACE and cough is improved...  DYSLIPIDEMIA>  On Crestor, Tricor + diet per DrPaz...   Obesity>  We reviewed diet & exercise but he has been noncompliant w/ both...  GI> GERD, IBS, Hems>  Stable on NexiumBid, etc...  GU> BPH, ED, Hypogonadism>  Followed by Lyn Hollingshead, DrKerr, & Urology...  DJD>  Followed by DrDean w/ prev left knee arthroscopy...  Vit D Defic>  On OTC Vit D supplementation & ok...  NEUROPATHY>  Followed by DrFeraru in [place] & intol to Neurontin/ Lyrica, etc; on VICODIN 2.5-500 Tid per Neurology...  ANXIETY>  he still sees Psychologist, occupational, for his Paxil & Klonopin...   Patient's Medications  New Prescriptions   No medications on file  Previous Medications   AMLODIPINE (NORVASC) 10 MG TABLET    TAKE 1 TABLET BY MOUTH ONCE DAILY   ASCORBIC ACID (VITAMIN C) 500 MG TABLET    Take 1,000 mg by mouth daily.    ASPIRIN 81 MG TABLET    Take 81 mg by mouth daily.     CALCIUM CARBONATE-VITAMIN D (RA CALCIUM PLUS VITAMIN D) 600-400 MG-UNIT PER TABLET    Take 1 tablet by mouth daily.     CHOLECALCIFEROL (VITAMIN D) 2000 UNITS TABLET    Take 2,000 Units by mouth daily.     CLONAZEPAM (KLONOPIN) 1 MG TABLET    Take 1 mg by mouth 2 (two) times daily as needed.      COENZYME Q10 (CO Q 10 PO)    Take  1 tablet by mouth daily.    CYANOCOBALAMIN (B-12) 2000 MCG TABS    Take 1 tablet by mouth daily.   FENOFIBRATE (TRICOR) 145 MG TABLET    Take 1 tablet (145 mg total) by mouth daily.   LANSOPRAZOLE (PREVACID) 30 MG CAPSULE    TAKE 1 CAPSULE BY MOUTH TWICE A DAY BEFORE A MEAL   MULTIPLE VITAMINS-MINERALS (CENTRUM SILVER PO)    Take 1 tablet by mouth daily. Reported on 03/17/2015   PAROXETINE (PAXIL) 20 MG TABLET    Take 20 mg by mouth daily.   PYRIDOXINE (VITAMIN B-6) 100 MG TABLET    Take 100 mg by mouth daily.   ROSUVASTATIN (CRESTOR) 20 MG TABLET    Take 1 tablet (20 mg total) by mouth every other day.  Modified Medications   Modified Medication Previous Medication   AMOXICILLIN (AMOXIL) 500 MG TABLET amoxicillin (AMOXIL) 500 MG tablet      Take 1 tablet (500 mg total) by mouth 3 (three) times daily. Take as directed    Take 1 tablet (500 mg total) by mouth 3 (three) times daily. Take as directed   HYDROCODONE-HOMATROPINE (HYCODAN) 5-1.5 MG/5ML SYRUP HYDROcodone-homatropine (HYCODAN) 5-1.5 MG/5ML syrup      Take 5 mLs by mouth every 6 (six) hours as needed for cough.    Take 5 mLs by mouth every 6 (six) hours as needed for cough.  Discontinued Medications   No medications on file

## 2017-12-07 ENCOUNTER — Other Ambulatory Visit: Payer: Self-pay | Admitting: Internal Medicine

## 2017-12-08 ENCOUNTER — Ambulatory Visit: Payer: Medicare Other | Admitting: Pulmonary Disease

## 2018-02-02 ENCOUNTER — Ambulatory Visit: Payer: Medicare Other | Admitting: Internal Medicine

## 2018-02-02 ENCOUNTER — Encounter: Payer: Self-pay | Admitting: Internal Medicine

## 2018-02-02 VITALS — BP 144/72 | HR 77 | Temp 97.8°F | Resp 16 | Ht 72.0 in | Wt 278.2 lb

## 2018-02-02 DIAGNOSIS — E118 Type 2 diabetes mellitus with unspecified complications: Secondary | ICD-10-CM | POA: Diagnosis not present

## 2018-02-02 DIAGNOSIS — R05 Cough: Secondary | ICD-10-CM

## 2018-02-02 DIAGNOSIS — I10 Essential (primary) hypertension: Secondary | ICD-10-CM

## 2018-02-02 DIAGNOSIS — E785 Hyperlipidemia, unspecified: Secondary | ICD-10-CM | POA: Diagnosis not present

## 2018-02-02 DIAGNOSIS — R053 Chronic cough: Secondary | ICD-10-CM

## 2018-02-02 MED ORDER — HYDROCODONE-HOMATROPINE 5-1.5 MG/5ML PO SYRP: 5.0000 mL | ORAL_SOLUTION | Freq: Four times a day (QID) | ORAL | 0 refills | Status: DC | PRN

## 2018-02-02 MED ORDER — AMLODIPINE BESYLATE 10 MG PO TABS: 10.0000 mg | ORAL_TABLET | Freq: Every day | ORAL | 3 refills | Status: DC

## 2018-02-02 NOTE — Assessment & Plan Note (Signed)
  DM: Per endocrinology, last A1c was 6.7 (09/2017). HTN -- BP today 144/72.  No change for now, continue amlodipine. RF sent  Dyslipidemia: Controlled, on Crestor and TriCor. Chronic cough: Dr. Lenna Gilford retired, patient request to refill   hydrocodone which he uses regularly ,  I agreed to do that until he sees his new pulmonologist, RX printed .  He also self-starts amoxicillin if he has an episode of cough continue and sometimes takes a Z-Pak.  Request me to refill antibiotics when needed.  I agreed to do so until he sees his new pulmonologist. S/p flu shot  RTC 07/2018 cpx

## 2018-02-02 NOTE — Patient Instructions (Signed)
   GO TO THE FRONT DESK Schedule your next appointment for a physical exam by May 2020 Continue the same medications Please see your new pulmonologist

## 2018-02-02 NOTE — Progress Notes (Signed)
Subjective:    Patient ID: Ronald King, male    DOB: 1945/12/26, 72 y.o.   MRN: [id number]  DOS:  02/02/2018 Type of visit - description : rov Interval history: Routine visit. DM: Last note from Endo reviewed Chronic cough: Last note from pulmonary reviewed. Good med compliance.   Review of Systems Had a flu and shingles shot 3 weeks ago, he had a "reaction" for several days including a flulike feeling, subjective fever and achiness.  Overall he is now better.   Past Medical History:  Diagnosis Date  . Anemia   . Anxiety   . Chronic bronchitis   . Chronic cough   . Diabetes ([place]) 07-2011   per Dr Buddy Duty  . DJD (degenerative joint disease)   . Dyslipidemia   . Esophageal polyp   . GERD (gastroesophageal reflux disease)    s/p [employer] F.  . Hemorrhoids, internal   . History of gastrointestinal hemorrhage   . Hypertension   . Hypogonadism, male    per Dr Buddy Duty  . Neuromuscular disorder ([facility])    neuropathy feet  . Neuropathy    per neuro-Dr Ferrarou vicodin  . OSA (obstructive sleep apnea)    on CPAP  . Sleep apnea    CPAP at night  . Vitamin D deficiency     Past Surgical History:  Procedure Laterality Date  . ANAL FISSURE REPAIR    . hand fracture surgery     left  . KNEE ARTHROSCOPY  2007   Dr. Marlou Sa  . LAPAROSCOPIC NISSEN FUNDOPLICATION  10/2421   Dr. Hassell Done  . NOSE SURGERY    . TONSILLECTOMY      Social History   Socioeconomic History  . Marital status: Married    Spouse name: Not on file  . Number of children: 1  . Years of education: Not on file  . Highest education level: Not on file  Occupational History  . Occupation: Technical brewer, retired    Comment: retired  . Occupation: minister    Comment: [employer], [place]  . Financial resource strain: Not on file  . Food insecurity:    Worry: Not on file    Inability: Not on file  . Transportation needs:    Medical: Not on file    Non-medical: Not on file    Tobacco Use  . Smoking status: Former Smoker    Packs/day: 1.00    Years: 3.00    Pack years: 3.00    Last attempt to quit: 03/29/1965    Years since quitting: 52.8  . Smokeless tobacco: Never Used  Substance and Sexual Activity  . Alcohol use: No  . Drug use: No  . Sexual activity: Not on file  Lifestyle  . Physical activity:    Days per week: Not on file    Minutes per session: Not on file  . Stress: Not on file  Relationships  . Social connections:    Talks on phone: Not on file    Gets together: Not on file    Attends religious service: Not on file    Active member of club or organization: Not on file    Attends meetings of clubs or organizations: Not on file    Relationship status: Not on file  . Intimate partner violence:    Fear of current or ex partner: Not on file    Emotionally abused: Not on file    Physically abused: Not on file  Forced sexual activity: Not on file  Other Topics Concern  . Not on file  Social History Narrative   Lives w/ wife      Allergies as of 02/02/2018      Reactions   Montelukast Shortness Of Breath   Ceftin [cefuroxime Axetil]    Duloxetine    Other reaction(s): Other (See Comments) refuses   Lyrica [pregabalin] Other (See Comments)   Drowsiness   Neurontin [gabapentin] Other (See Comments)   Drowsiness       Medication List        Accurate as of 02/02/18  5:11 PM. Always use your most recent med list.          amLODipine 10 MG tablet Commonly known as:  NORVASC Take 1 tablet (10 mg total) by mouth daily.   amoxicillin 500 MG tablet Commonly known as:  AMOXIL Take 1 tablet (500 mg total) by mouth 3 (three) times daily. Take as directed   ascorbic acid 500 MG tablet Commonly known as:  VITAMIN C Take 1,000 mg by mouth daily.   aspirin 81 MG tablet Take 81 mg by mouth daily.   B-12 2000 MCG Tabs Take 1 tablet by mouth daily.   CENTRUM SILVER PO Take 1 tablet by mouth daily. Reported on 03/17/2015    clonazePAM 1 MG tablet Commonly known as:  KLONOPIN Take 1 mg by mouth 2 (two) times daily as needed.   CO Q 10 PO Take 1 tablet by mouth daily.   fenofibrate 145 MG tablet Commonly known as:  TRICOR TAKE 1 TABLET(145 MG) BY MOUTH DAILY   HYDROcodone-homatropine 5-1.5 MG/5ML syrup Commonly known as:  HYCODAN Take 5 mLs by mouth every 6 (six) hours as needed for cough.   lansoprazole 30 MG capsule Commonly known as:  PREVACID TAKE 1 CAPSULE BY MOUTH TWICE A DAY BEFORE A MEAL   PARoxetine 20 MG tablet Commonly known as:  PAXIL Take 20 mg by mouth daily.   pyridOXINE 100 MG tablet Commonly known as:  VITAMIN B-6 Take 100 mg by mouth daily.   RA CALCIUM PLUS VITAMIN D 600-400 MG-UNIT tablet Generic drug:  Calcium Carbonate-Vitamin D Take 1 tablet by mouth daily.   rosuvastatin 20 MG tablet Commonly known as:  CRESTOR Take 1 tablet (20 mg total) by mouth every other day.   Vitamin D 50 MCG (2000 UT) tablet Take 2,000 Units by mouth daily.          Objective:   Physical Exam BP (!) 144/72 (BP Location: Left Arm, Patient Position: Sitting, Cuff Size: Normal)   Pulse 77   Temp 97.8 F (36.6 C) (Oral)   Resp 16   Ht 6' (1.829 m)   Wt 278 lb 4 oz (126.2 kg)   SpO2 95%   BMI 37.74 kg/m  General:   Well developed, NAD, BMI noted. HEENT:  Normocephalic . Face symmetric, atraumatic Lungs:  CTA B, few rhonchi with cough, no wheezing. Normal respiratory effort, no intercostal retractions, no accessory muscle use. Heart: RRR,  no murmur.  No pretibial edema bilaterally  Skin: Not pale. Not jaundice Neurologic:  alert & oriented X3.  Speech normal, gait appropriate for age and unassisted Psych--  Cognition and judgment appear intact.  Cooperative with normal attention span and concentration.  Behavior appropriate. No anxious or depressed appearing.      Assessment & Plan:   Assessment  ENDO: Dr Buddy Duty -DM - hypogonadism  HTN Dislipidemia NEURO: --+  Neuropathy , Dr Tonia Ghent,  on Vicodin --RLS type of symptoms Obesity, BMI 33 Anxiety -- paxil- clonazepam (per Dr Henreitta Cea D deficiency PULM:  --OSA, CPAP --Chronic cough-- Dr Lenna Gilford DJD, Dr. Marlou Sa GU: Dr Erenest Rasher BPH, ED   GI: --GERD, status post Nissen fundoplication --Esophageal polyp -- h/o GI bleed- tranfussion (90s)  PLAN: DM: Per endocrinology, last A1c was 6.7 (09/2017). HTN -- BP today 144/72.  No change for now, continue amlodipine. RF sent  Dyslipidemia: Controlled, on Crestor and TriCor. Chronic cough: Dr. Lenna Gilford retired, patient request to refill   hydrocodone which he uses regularly ,  I agreed to do that until he sees his new pulmonologist, RX printed .  He also self-starts amoxicillin if he has an episode of cough continue and sometimes takes a Z-Pak.  Request me to refill antibiotics when needed.  I agreed to do so until he sees his new pulmonologist. S/p flu shot  RTC 07/2018 cpx

## 2018-02-02 NOTE — Progress Notes (Signed)
Pre visit review using our clinic review tool, if applicable. No additional management support is needed unless otherwise documented below in the visit note. 

## 2018-03-14 ENCOUNTER — Other Ambulatory Visit: Payer: Self-pay | Admitting: Internal Medicine

## 2018-04-27 LAB — MICROALBUMIN, URINE: Microalb, Ur: 3.72

## 2018-04-27 LAB — HEPATIC FUNCTION PANEL
ALT: 20 (ref 10–40)
AST: 19 (ref 14–40)
Alkaline Phosphatase: 52 (ref 25–125)
Bilirubin, Total: 0.6

## 2018-04-27 LAB — BASIC METABOLIC PANEL
BUN: 15 (ref 4–21)
Creatinine: 1 (ref 0.6–1.3)
Glucose: 122
Potassium: 4.5 (ref 3.4–5.3)
Sodium: 142 (ref 137–147)

## 2018-04-27 LAB — HEMOGLOBIN A1C: Hemoglobin A1C: 6.7

## 2018-07-10 ENCOUNTER — Ambulatory Visit (INDEPENDENT_AMBULATORY_CARE_PROVIDER_SITE_OTHER): Payer: Medicare Other | Admitting: Pulmonary Disease

## 2018-07-10 ENCOUNTER — Other Ambulatory Visit: Payer: Self-pay

## 2018-07-10 ENCOUNTER — Telehealth: Payer: Self-pay | Admitting: Pulmonary Disease

## 2018-07-10 ENCOUNTER — Encounter: Payer: Self-pay | Admitting: Pulmonary Disease

## 2018-07-10 DIAGNOSIS — R053 Chronic cough: Secondary | ICD-10-CM

## 2018-07-10 DIAGNOSIS — R05 Cough: Secondary | ICD-10-CM

## 2018-07-10 DIAGNOSIS — G4733 Obstructive sleep apnea (adult) (pediatric): Secondary | ICD-10-CM | POA: Diagnosis not present

## 2018-07-10 MED ORDER — HYDROCODONE-HOMATROPINE 5-1.5 MG/5ML PO SYRP: 5.0000 mL | ORAL_SOLUTION | Freq: Four times a day (QID) | ORAL | 0 refills | Status: DC | PRN

## 2018-07-10 MED ORDER — AMOXICILLIN 500 MG PO TABS: 500.0000 mg | ORAL_TABLET | Freq: Three times a day (TID) | ORAL | 0 refills | Status: DC

## 2018-07-10 NOTE — Telephone Encounter (Signed)
Contacted patient by phone re: needing a new doctor.  Patient states Dr. Lenna Gilford treated him for recurring bronchitis.  He does not recall getting a letter in Feb 2020 to set up appt with Dr. Halford Chessman since [place] retired.  Today he is describing a need to seek pulmonary care for recurrent bronchitis symptoms:  Dry cough, left lung 'burns' and requests antibiotic and hycodan which is what Dr. Lenna Gilford usually prescribed.  Patient states he has problems with allergies/pollen and believes this has caused his symptoms.   Offered Televisit today with Wyn Quaker, NP and patient agreed and televisit scheduled.    Primary Pulmonologist: previously Lenna Gilford  Last office visit and with whom: Lenna Gilford What do we see them for (pulmonary problems): bronchitis/osa  Reason for call: cough x 2 weeks   In the last month, have you been in contact with someone who was confirmed or suspected to have [place]avirus / COVID-19?  no  Do you have any of the following symptoms developed in the last 30 days? Fever: no Cough:  Shortness of breath: no  When did your symptoms start?  approx 2 weeks   If the patient has a fever, what is the last reading?  (use n/a if patient denies fever)   . IF THE PATIENT STATES THEY DO NOT OWN A THERMOMETER, THEY MUST GO AND PURCHASE ONE When did the fever start?: no fever Have you taken any medication to suppress a fever (ie Ibuprofen, Aleve, Tylenol)?: no  TRIAGE :: REMIND PATIENT TO SELF-ISOLATE WHILE THEY'RE MESSAGE IS BEING HANDLED  (examples of things to ask: : When did symptoms start? Fever? Cough? Productive? Color to sputum? More sputum than usual? Wheezing? Are you having any chest pain?  Have you needed increased oxygen? Are you taking your respiratory medications? What over the counter measures have you tried?)

## 2018-07-10 NOTE — Patient Instructions (Addendum)
Refilled Amoxicillin   Refilled Hycodan cough syrup  I have placed an order for you to complete a pulmonary function test prior to your next office visit with Dr. Halford Chessman  Our new address and phone number are:  [address] [place], [place] [address] Telephone number: [phone]  We recommend that you continue using your CPAP daily >>>Keep up the hard work using your device >>> Goal should be wearing this for the entire night that you are sleeping, at least 4 to 6 hours  Remember:  . Do not drive or operate heavy machinery if tired or drowsy.  . Please notify the supply company and office if you are unable to use your device regularly due to missing supplies or machine being broken.  . Work on maintaining a healthy weight and following your recommended nutrition plan  . Maintain proper daily exercise and movement  . Maintaining proper use of your device can also help improve management of other chronic illnesses such as: Blood pressure, blood sugars, and weight management.   BiPAP/ CPAP Cleaning:  >>>Clean weekly, with Dawn soap, and bottle brush.  Set up to air dry.     Return in about 3 months (around 10/09/2018), or if symptoms worsen or fail to improve, for Follow up with Dr. Halford Chessman.    Coronavirus (COVID-19) Are you at risk?  Are you at risk for the Coronavirus (COVID-19)?  To be considered HIGH RISK for Coronavirus (COVID-19), you have to meet the following criteria:  . Traveled to [place], [place], [place], [place] or [place]; or in the [place] to [place], [place], [place], or [place]; and have fever, cough, and shortness of breath within the last 2 weeks of travel OR . Been in close contact with a person diagnosed with COVID-19 within the last 2 weeks and have fever, cough, and shortness of breath . IF YOU DO NOT MEET THESE CRITERIA, YOU ARE CONSIDERED LOW RISK FOR COVID-19.  What to do if you are HIGH RISK for COVID-19?  Marland Kitchen If you are having a  medical emergency, call 911. . Seek medical care right away. Before you go to a doctor's office, urgent care or emergency department, call ahead and tell them about your recent travel, contact with someone diagnosed with COVID-19, and your symptoms. You should receive instructions from your physician's office regarding next steps of care.  . When you arrive at healthcare provider, tell the healthcare staff immediately you have returned from visiting [place], [place], [place], [place] or [place]; or traveled in the [place] to [place], [place], [place], or [place]; in the last two weeks or you have been in close contact with a person diagnosed with COVID-19 in the last 2 weeks.   . Tell the health care staff about your symptoms: fever, cough and shortness of breath. . After you have been seen by a medical provider, you will be either: o Tested for (COVID-19) and discharged home on quarantine except to seek medical care if symptoms worsen, and asked to  - Stay home and avoid contact with others until you get your results (4-5 days)  - Avoid travel on public transportation if possible (such as bus, train, or airplane) or o Sent to the Emergency Department by EMS for evaluation, COVID-19 testing, and possible admission depending on your condition and test results.  What to do if you are LOW RISK for COVID-19?  Reduce your risk of any infection by using the same  precautions used for avoiding the common cold or flu:  Marland Kitchen Wash your hands often with soap and warm water for at least 20 seconds.  If soap and water are not readily available, use an alcohol-based hand sanitizer with at least 60% alcohol.  . If coughing or sneezing, cover your mouth and nose by coughing or sneezing into the elbow areas of your shirt or coat, into a tissue or into your sleeve (not your hands). . Avoid shaking hands with others and consider head nods or verbal greetings only. . Avoid touching your eyes, nose, or mouth with  unwashed hands.  . Avoid close contact with people who are sick. . Avoid places or events with large numbers of people in one location, like concerts or sporting events. . Carefully consider travel plans you have or are making. . If you are planning any travel outside or inside the [place], visit the CDC's Travelers' Health webpage for the latest health notices. . If you have some symptoms but not all symptoms, continue to monitor at home and seek medical attention if your symptoms worsen. . If you are having a medical emergency, call 911.   [place] / e-Visit: [email or web address]         [facility]: [facility]: [phone]                   [facility]: [phone]           It is flu season:   >>> Best ways to protect herself from the flu: Receive the yearly flu vaccine, practice good hand hygiene washing with soap and also using hand sanitizer when available, eat a nutritious meals, get adequate rest, hydrate appropriately   Please contact the office if your symptoms worsen or you have concerns that you are not improving.   Thank you for choosing [facility] for your healthcare, and for allowing [place] to partner with you on your healthcare journey. I am thankful to be able to provide care to you today.   Wyn Quaker FNP-C      Cough, Adult  Coughing is a reflex that clears your throat and your airways. Coughing helps to heal and protect your lungs. It is normal to cough occasionally, but a cough that happens with other symptoms or lasts a long time may be a sign of a condition that needs treatment. A cough may last only 2-3 weeks (acute), or it may last longer than 8 weeks (chronic). What are the causes? Coughing is commonly caused by:  Breathing in substances that irritate your lungs.  A viral or bacterial  respiratory infection.  Allergies.  Asthma.  Postnasal drip.  Smoking.  Acid backing up from the stomach into the esophagus (gastroesophageal reflux).  Certain medicines.  Chronic lung problems, including COPD (or rarely, lung cancer).  Other medical conditions such as heart failure. Follow these instructions at home: Pay attention to any changes in your symptoms. Take these actions to help with your discomfort:  Take medicines only as told by your health care provider. ? If you were prescribed an antibiotic medicine, take it as told by your health care provider. Do not stop taking the antibiotic even if you start to feel better. ? Talk with your health care provider before you take a cough suppressant medicine.  Drink enough fluid to keep your urine clear or pale yellow.  If the air is  dry, use a cold steam vaporizer or humidifier in your bedroom or your home to help loosen secretions.  Avoid anything that causes you to cough at work or at home.  If your cough is worse at night, try sleeping in a semi-upright position.  Avoid cigarette smoke. If you smoke, quit smoking. If you need help quitting, ask your health care provider.  Avoid caffeine.  Avoid alcohol.  Rest as needed. Contact a health care provider if:  You have new symptoms.  You cough up pus.  Your cough does not get better after 2-3 weeks, or your cough gets worse.  You cannot control your cough with suppressant medicines and you are losing sleep.  You develop pain that is getting worse or pain that is not controlled with pain medicines.  You have a fever.  You have unexplained weight loss.  You have night sweats. Get help right away if:  You cough up blood.  You have difficulty breathing.  Your heartbeat is very fast. This information is not intended to replace advice given to you by your health care provider. Make sure you discuss any questions you have with your health care  provider. Document Released: 09/11/2010 Document Revised: 08/21/2015 Document Reviewed: 05/22/2014 Elsevier Interactive Patient Education  2019 [employer].

## 2018-07-10 NOTE — Assessment & Plan Note (Signed)
Plan: Continue CPAP Follow-up with Dr. Halford Chessman in 3 months to establish with him

## 2018-07-10 NOTE — Assessment & Plan Note (Addendum)
Assessment: History of significant sinus drainage as well as allergies Intolerant of leukotriene's as well as antihistamines Patient with known acid reflux managed on Prevacid 30 mg Former smoker No CT imaging since 2003 No pulmonary function test results seen in chart Tried on other inhalers but per patient did not tolerate Patient reports that he has been maintained on as needed use of amoxicillin as well as Hycodan cough syrup as needed   Plan: We will refill amoxicillin and Hycodan cough syrup I have checked PMP aware patient has a low overdose risk score We will schedule the patient for pulmonary function test prior to his next office visit to establish with Dr. Halford Chessman I discussed with the patient extensively that I believe we need to proceed forward with pulmonary function testing as well as potentially CT imaging to further evaluate his chronic cough.  I do not think that we are best managing him with as needed amoxicillin as well as Hycodan cough syrup.  As today is a telephonic office visit will continue that plan of care but proceed forward with the ultimate goal to help reduce his antibiotic use as well as reduce his narcotic cough syrup use.  Consider CT imaging in the future Consider culturing his sputum when he is having a flare Can consider cough neuropathy treatment to see if patient would tolerate gabapentin at a lower dose or amitriptyline Can consider referral back to ENT for second opinion

## 2018-07-10 NOTE — Progress Notes (Signed)
Virtual Visit via Telephone Note  I connected with Ronald King on 07/10/18 at  2:30 PM EDT by telephone and verified that I am speaking with the correct person using two identifiers.   I discussed the limitations, risks, security and privacy concerns of performing an evaluation and management service by telephone and the availability of in person appointments. I also discussed with the patient that there may be a patient responsible charge related to this service. The patient expressed understanding and agreed to proceed.   History of Present Illness: 73 year old former smoker followed in our office for chronic cough and osa Former Environmental consultant  Pt to seen by Dr. Halford Chessman  Patient consented to consult via telephone: Yes  People present and their role in pt care: Pt   Chief complaint: Chronic Cough   73 year old male former smoker former patient of Dr. Rito King.  Patient has plans to see Dr. Halford Chessman that this appointment was rescheduled due to COVID-19 restrictions.  Patient is followed in our office for chronic cough as well as obstructive sleep apnea.  Patient reports he is having no issues with his CPAP.  His persistent complaint is his flares of his chronic cough.  This is been managed in the past by Dr. Lenna Gilford with as needed amoxicillin, occasional rounds of azithromycin, and Hycodan cough syrup.  Hycodan cough syrup was last refilled by his primary care provider in November/2019.  Patient reports that he has had an extensive work-up for the management of his cough, breathing, allergies.  Patient reports that when he turned 26 he started to have severe increases in sinus drainage as well as allergies.  He used to get allergy shots but he did not find any benefit and actually felt that these were making it worse.  He talked with Dr. Lenna Gilford they approved stopping allergy shots.  He also has been evaluated by an ENT?  Dr. Vicente Serene who reported the patient had a deviated septum and recommended surgery but  the patient did not follow through with this.  He is not regularly followed by an ENT.  Patient denies being a preterm baby.  Patient denies recurrent pneumonias at a young age.  Patient denies asthma or allergy symptoms when he was younger.  All symptoms started to develop around age 57.  Patient feels that he believes he had a breathing test which sounds like a methacholine challenge test by Dr. Gwenette Greet but I am not seeing any current record of this.  Patient was tried on inhalers for management of his breathing but he reports that most the inhalers just made the cough worse.  Patient has chronic neuropathy which she has been tried on gabapentin as well as Lyrica but this caused a brain fog and so patient did not want to continue on those.  Chart review reveals no CT imaging since 2003.  Patient reports that he is not having any current or active symptoms he reported he was having some symptoms yesterday which was left lung pain which resolved after taking his amoxicillin 2 times as well as 1 dose of his Hycodan cough syrup.  See cough ROS listed below:  07/10/2018 - Cough ROS:   When to the symptoms start: many years ago  How are you today: ok, cough is better  Have you had fever/sore throat (first 5 to 7 days of URI) or Have you had cough/nasal congestion (10 to 14 days of URI) : none  Have you used anything to treat the cough,  as anything improved: amoxcillin prn, hycodan prn  Is it a dry or wet cough: Non productive, dry cough - can be productive if it worsens Does the cough happen when your breathing or when you breathe out: unsure  Other any triggers to your cough, or any aggravating factors:  Daily antihistamine: none (pt says it dries him out)  GERD treatment: Prevacid 30mg  Singulair: none (allergy to it)  Cough checklist (bolded indicates presence):  Adherence, acid reflux, ACE inhibitor, active sinus disease, active smoking, former smoker, adverse effects of medications  (amiodarone/Macrodantin/bb), alpha 1, ?allergies, aspiration, anxiety, ?bronchiectasis, congestive heart failure (diastolic)   Observations/Objective:  12/07/2017-chest x-ray-no active cardiopulmonary disease  No results found for: NITRICOXIDE  Assessment and Plan:  Chronic cough Assessment: History of significant sinus drainage as well as allergies Intolerant of leukotriene's as well as antihistamines Patient with known acid reflux managed on Prevacid 30 mg Former smoker No CT imaging since 2003 No pulmonary function test results seen in chart Tried on other inhalers but per patient did not tolerate Patient reports that he has been maintained on as needed use of amoxicillin as well as Hycodan cough syrup as needed   Plan: We will refill amoxicillin and Hycodan cough syrup I have checked PMP aware patient has a low overdose risk score We will schedule the patient for pulmonary function test prior to his next office visit to establish with Dr. Halford Chessman I discussed with the patient extensively that I believe we need to proceed forward with pulmonary function testing as well as potentially CT imaging to further evaluate his chronic cough.  I do not think that we are best managing him with as needed amoxicillin as well as Hycodan cough syrup.  As today is a telephonic office visit will continue that plan of care but proceed forward with the ultimate goal to help reduce his antibiotic use as well as reduce his narcotic cough syrup use.  Consider CT imaging in the future Consider culturing his sputum when he is having a flare Can consider cough neuropathy treatment to see if patient would tolerate gabapentin at a lower dose or amitriptyline Can consider referral back to ENT for second opinion  Obstructive sleep apnea Plan: Continue CPAP Follow-up with Dr. Halford Chessman in 3 months to establish with him  Before prescribing the patient with Hycodan cough syrup, I have checked [place] PMP aware and the  patients overdose risk score is 180. Patient has 5 providers prescribing controlled substances. Patient has used 1 pharmacies. I have counseled the patient on the sedative effects of Hycodan cough syrup. Patient to use this medication sparingly and not when driving, drinking alcohol, or using additional sedative medications. Patient has been prescribed 265ml with no refills.    Follow Up Instructions:  Return in about 3 months (around 10/09/2018), or if symptoms worsen or fail to improve, for Follow up with Dr. Halford Chessman, Follow up for PFT.    I discussed the assessment and treatment plan with the patient. The patient was provided an opportunity to ask questions and all were answered. The patient agreed with the plan and demonstrated an understanding of the instructions.   The patient was advised to call back or seek an in-person evaluation if the symptoms worsen or if the condition fails to improve as anticipated.  I provided 39 minutes of non-face-to-face time during this encounter.   Lauraine Rinne, NP

## 2018-07-17 ENCOUNTER — Encounter: Payer: Self-pay | Admitting: Gastroenterology

## 2018-08-14 ENCOUNTER — Ambulatory Visit (INDEPENDENT_AMBULATORY_CARE_PROVIDER_SITE_OTHER): Payer: Medicare Other | Admitting: Gastroenterology

## 2018-08-14 ENCOUNTER — Encounter: Payer: Self-pay | Admitting: Gastroenterology

## 2018-08-14 ENCOUNTER — Other Ambulatory Visit: Payer: Self-pay

## 2018-08-14 VITALS — Ht 73.0 in | Wt 267.0 lb

## 2018-08-14 DIAGNOSIS — K219 Gastro-esophageal reflux disease without esophagitis: Secondary | ICD-10-CM | POA: Diagnosis not present

## 2018-08-14 DIAGNOSIS — R12 Heartburn: Secondary | ICD-10-CM

## 2018-08-14 NOTE — Patient Instructions (Addendum)
Continue Prevacid, take it 30 to 45 minutes before lunch  Antireflux measures  Gaviscon 1 tablet as needed after meals and at bedtime  Carafate 1 g tablet daily as needed for severe heartburn/epigastric discomfort X 30 tablets  Due for recall colonoscopy in June 2020, patient would like to wait until July or August to schedule it. ( New Recall in out system for July)   Follow-up in 6 months  I appreciate the  opportunity to care for you  Thank You   Harl Bowie , MD   Gastroesophageal Reflux Disease, Adult Gastroesophageal reflux (GER) happens when acid from the stomach flows up into the tube that connects the mouth and the stomach (esophagus). Normally, food travels down the esophagus and stays in the stomach to be digested. However, when a person has GER, food and stomach acid sometimes move back up into the esophagus. If this becomes a more serious problem, the person may be diagnosed with a disease called gastroesophageal reflux disease (GERD). GERD occurs when the reflux:  Happens often.  Causes frequent or severe symptoms.  Causes problems such as damage to the esophagus. When stomach acid comes in contact with the esophagus, the acid may cause soreness (inflammation) in the esophagus. Over time, GERD may create small holes (ulcers) in the lining of the esophagus. What are the causes? This condition is caused by a problem with the muscle between the esophagus and the stomach (lower esophageal sphincter, or LES). Normally, the LES muscle closes after food passes through the esophagus to the stomach. When the LES is weakened or abnormal, it does not close properly, and that allows food and stomach acid to go back up into the esophagus. The LES can be weakened by certain dietary substances, medicines, and medical conditions, including:  Tobacco use.  Pregnancy.  Having a hiatal hernia.  Alcohol use.  Certain foods and beverages, such as coffee, chocolate, onions, and  peppermint. What increases the risk? You are more likely to develop this condition if you:  Have an increased body weight.  Have a connective tissue disorder.  Use NSAID medicines. What are the signs or symptoms? Symptoms of this condition include:  Heartburn.  Difficult or painful swallowing.  The feeling of having a lump in the throat.  Abitter taste in the mouth.  Bad breath.  Having a large amount of saliva.  Having an upset or bloated stomach.  Belching.  Chest pain. Different conditions can cause chest pain. Make sure you see your health care provider if you experience chest pain.  Shortness of breath or wheezing.  Ongoing (chronic) cough or a night-time cough.  Wearing away of tooth enamel.  Weight loss. How is this diagnosed? Your health care provider will take a medical history and perform a physical exam. To determine if you have mild or severe GERD, your health care provider may also monitor how you respond to treatment. You may also have tests, including:  A test to examine your stomach and esophagus with a small camera (endoscopy).  A test thatmeasures the acidity level in your esophagus.  A test thatmeasures how much pressure is on your esophagus.  A barium swallow or modified barium swallow test to show the shape, size, and functioning of your esophagus. How is this treated? The goal of treatment is to help relieve your symptoms and to prevent complications. Treatment for this condition may vary depending on how severe your symptoms are. Your health care provider may recommend:  Changes to your diet.  Medicine.  Surgery. Follow these instructions at home: Eating and drinking   Follow a diet as recommended by your health care provider. This may involve avoiding foods and drinks such as: ? Coffee and tea (with or without caffeine). ? Drinks that containalcohol. ? Energy drinks and sports drinks. ? Carbonated drinks or sodas. ? Chocolate  and cocoa. ? Peppermint and mint flavorings. ? Garlic and onions. ? Horseradish. ? Spicy and acidic foods, including peppers, chili powder, curry powder, vinegar, hot sauces, and barbecue sauce. ? Citrus fruit juices and citrus fruits, such as oranges, lemons, and limes. ? Tomato-based foods, such as red sauce, chili, salsa, and pizza with red sauce. ? Fried and fatty foods, such as donuts, french fries, potato chips, and high-fat dressings. ? High-fat meats, such as hot dogs and fatty cuts of red and white meats, such as rib eye steak, sausage, ham, and bacon. ? High-fat dairy items, such as whole milk, butter, and cream cheese.  Eat small, frequent meals instead of large meals.  Avoid drinking large amounts of liquid with your meals.  Avoid eating meals during the 2-3 hours before bedtime.  Avoid lying down right after you eat.  Do not exercise right after you eat. Lifestyle   Do not use any products that contain nicotine or tobacco, such as cigarettes, e-cigarettes, and chewing tobacco. If you need help quitting, ask your health care provider.  Try to reduce your stress by using methods such as yoga or meditation. If you need help reducing stress, ask your health care provider.  If you are overweight, reduce your weight to an amount that is healthy for you. Ask your health care provider for guidance about a safe weight loss goal. General instructions  Pay attention to any changes in your symptoms.  Take over-the-counter and prescription medicines only as told by your health care provider. Do not take aspirin, ibuprofen, or other NSAIDs unless your health care provider told you to do so.  Wear loose-fitting clothing. Do not wear anything tight around your waist that causes pressure on your abdomen.  Raise (elevate) the head of your bed about 6 inches (15 cm).  Avoid bending over if this makes your symptoms worse.  Keep all follow-up visits as told by your health care  provider. This is important. Contact a health care provider if:  You have: ? New symptoms. ? Unexplained weight loss. ? Difficulty swallowing or it hurts to swallow. ? Wheezing or a persistent cough. ? A hoarse voice.  Your symptoms do not improve with treatment. Get help right away if you:  Have pain in your arms, neck, jaw, teeth, or back.  Feel sweaty, dizzy, or light-headed.  Have chest pain or shortness of breath.  Vomit and your vomit looks like blood or coffee grounds.  Faint.  Have stool that is bloody or black.  Cannot swallow, drink, or eat. Summary  Gastroesophageal reflux happens when acid from the stomach flows up into the esophagus. GERD is a disease in which the reflux happens often, causes frequent or severe symptoms, or causes problems such as damage to the esophagus.  Treatment for this condition may vary depending on how severe your symptoms are. Your health care provider may recommend diet and lifestyle changes, medicine, or surgery.  Contact a health care provider if you have new or worsening symptoms.  Take over-the-counter and prescription medicines only as told by your health care provider. Do not take aspirin, ibuprofen, or other NSAIDs unless your health care  provider told you to do so.  Keep all follow-up visits as told by your health care provider. This is important. This information is not intended to replace advice given to you by your health care provider. Make sure you discuss any questions you have with your health care provider. Document Released: 12/23/2004 Document Revised: 09/21/2017 Document Reviewed: 09/21/2017 Elsevier Interactive Patient Education  2019 [employer].

## 2018-08-14 NOTE — Progress Notes (Signed)
Ronald King    [id number]    01/18/1946  Primary Care Physician:Paz, Alda Berthold, MD  Referring Physician: Colon Branch, [address], [place] [address]  This service was provided via audio only telemedicine (telephone) due to [place] 19 pandemic.  Patient location: Home Provider location: Office Used 2 patient identifiers to confirm the correct person. Explained the limitations in evaluation and management via telemedicine. Patient is aware of potential medical charges for this visit.  Patient consented to this virtual visit.  The persons participating in this telemedicine service were myself and the patient    Chief complaint:  GERD  HPI:  73 year old male with history of chronic GERD status post Nissen fundoplication March 7858.  He is on PPI with good control of symptoms.  Denies any dysphagia, odynophagia, vomiting, weight loss, change in bowel habits, melena or blood per rectum. EGD February 23, 2017 showed normal esophagus, intact Nissen fundoplication, normal stomach and duodenum  Outpatient Encounter Medications as of 08/14/2018  Medication Sig  . amLODipine (NORVASC) 10 MG tablet Take 1 tablet (10 mg total) by mouth daily.  Marland Kitchen amoxicillin (AMOXIL) 500 MG tablet Take 1 tablet (500 mg total) by mouth 3 (three) times daily. Take as directed (Patient taking differently: Take 500 mg by mouth 3 (three) times daily. Take as directed for chronic bronchitis)  . ascorbic acid (VITAMIN C) 500 MG tablet Take 1,000 mg by mouth daily.   Marland Kitchen aspirin 81 MG tablet Take 81 mg by mouth daily.    . Calcium Carbonate-Vitamin D (RA CALCIUM PLUS VITAMIN D) 600-400 MG-UNIT per tablet Take 1 tablet by mouth daily.    . Cholecalciferol (VITAMIN D) 2000 UNITS tablet Take 2,000 Units by mouth daily.    . clonazePAM (KLONOPIN) 1 MG tablet Take 1 mg by mouth 2 (two) times daily as needed.    . Coenzyme Q10 (CO Q 10 PO) Take 1 tablet by mouth daily.   .  Cyanocobalamin (B-12) 2000 MCG TABS Take 1 tablet by mouth daily.  . fenofibrate (TRICOR) 145 MG tablet Take 1 tablet (145 mg total) by mouth daily.  Marland Kitchen HYDROcodone-homatropine (HYCODAN) 5-1.5 MG/5ML syrup Take 5 mLs by mouth every 6 (six) hours as needed for cough.  . lansoprazole (PREVACID) 30 MG capsule TAKE 1 CAPSULE BY MOUTH TWICE A DAY BEFORE A MEAL  . metFORMIN (GLUCOPHAGE) 500 MG tablet Take by mouth 2 (two) times daily with a meal.  . Multiple Vitamins-Minerals (CENTRUM SILVER PO) Take 1 tablet by mouth daily. Reported on 03/17/2015  . PARoxetine (PAXIL) 20 MG tablet Take 20 mg by mouth daily.  Marland Kitchen pyridOXINE (VITAMIN B-6) 100 MG tablet Take 100 mg by mouth daily.  . rosuvastatin (CRESTOR) 20 MG tablet Take 1 tablet (20 mg total) by mouth every other day.   No facility-administered encounter medications on file as of 08/14/2018.     Allergies as of 08/14/2018 - Review Complete 08/14/2018  Allergen Reaction Noted  . Montelukast Shortness Of Breath 12/10/2015  . Ceftin [cefuroxime axetil]  02/10/2017  . Duloxetine  12/12/2015  . Lyrica [pregabalin] Other (See Comments) 12/10/2015  . Neurontin [gabapentin] Other (See Comments) 12/10/2015    Past Medical History:  Diagnosis Date  . Anemia   . Anxiety   . Chronic bronchitis   . Chronic cough   . Diabetes ([place]) 07-2011   per Dr Buddy Duty  . DJD (degenerative joint disease)   .  Dyslipidemia   . Esophageal polyp   . GERD (gastroesophageal reflux disease)    s/p [employer] F.  . Hemorrhoids, internal   . History of gastrointestinal hemorrhage   . Hypertension   . Hypogonadism, male    per Dr Buddy Duty  . Neuromuscular disorder ([facility])    neuropathy feet  . Neuropathy    per neuro-Dr Ferrarou vicodin  . OSA (obstructive sleep apnea)    on CPAP  . Sleep apnea    CPAP at night  . Vitamin D deficiency     Past Surgical History:  Procedure Laterality Date  . ANAL FISSURE REPAIR    . hand fracture surgery     left  . KNEE ARTHROSCOPY   2007   Dr. Marlou Sa  . LAPAROSCOPIC NISSEN FUNDOPLICATION  06/2593   Dr. Hassell Done  . NOSE SURGERY    . TONSILLECTOMY      Family History  Problem Relation Age of Onset  . Cerebral aneurysm Father   . Alcohol abuse Father   . Neuropathy Mother   . Heart disease Mother        died at age 7  . Migraines Brother        several  . Diabetes Maternal Uncle   . CAD Brother        CABG in his 51s  . Colon cancer Neg Hx   . Prostate cancer Neg Hx     Social History   Socioeconomic History  . Marital status: Married    Spouse name: Not on file  . Number of children: 1  . Years of education: Not on file  . Highest education level: Not on file  Occupational History  . Occupation: Technical brewer, retired    Comment: retired  . Occupation: minister    Comment: [employer], [place]  . Financial resource strain: Not on file  . Food insecurity:    Worry: Not on file    Inability: Not on file  . Transportation needs:    Medical: Not on file    Non-medical: Not on file  Tobacco Use  . Smoking status: Former Smoker    Packs/day: 1.00    Years: 3.00    Pack years: 3.00    Last attempt to quit: 03/29/1965    Years since quitting: 53.4  . Smokeless tobacco: Never Used  Substance and Sexual Activity  . Alcohol use: No  . Drug use: No  . Sexual activity: Not on file  Lifestyle  . Physical activity:    Days per week: Not on file    Minutes per session: Not on file  . Stress: Not on file  Relationships  . Social connections:    Talks on phone: Not on file    Gets together: Not on file    Attends religious service: Not on file    Active member of club or organization: Not on file    Attends meetings of clubs or organizations: Not on file    Relationship status: Not on file  . Intimate partner violence:    Fear of current or ex partner: Not on file    Emotionally abused: Not on file    Physically abused: Not on file    Forced sexual activity: Not on file   Other Topics Concern  . Not on file  Social History Narrative   Lives w/ wife      Review of systems: Review of Systems as per HPI All other  systems reviewed and are negative.   Physical Exam: Vitals were not taken and physical exam was not performed during this virtual visit.  Data Reviewed:  Reviewed labs, radiology imaging, old records and pertinent past GI work up   Assessment and Plan/Recommendations: 73 year old male with history of chronic GERD status post Nissen fundoplication with persistent intermittent heartburn on twice daily PPI  Given potential side effects with long-term use of PPI, discussed with patient decreasing the dose to once daily instead of twice daily  Continue Prevacid, take it 30 to 45 minutes before lunch  Antireflux measures  Gaviscon 1 tablet as needed after meals and at bedtime  Carafate 1 g tablet daily as needed for severe heartburn/epigastric discomfort X 30 tablets  Due for recall colonoscopy in June 2020, patient would like to wait until July or August to schedule it.  Follow-up in 6 months     K. Denzil Magnuson , MD   CC: Colon Branch, MD

## 2018-08-15 ENCOUNTER — Other Ambulatory Visit: Payer: Self-pay | Admitting: Internal Medicine

## 2018-08-23 ENCOUNTER — Encounter: Payer: Self-pay | Admitting: Gastroenterology

## 2018-08-23 NOTE — Progress Notes (Deleted)
Virtual Visit via Video Note  I connected with patient on 08/23/18 at  9:00 AM EDT by a video enabled telemedicine application and verified that I am speaking with the correct person using two identifiers.   THIS ENCOUNTER IS A VIRTUAL VISIT DUE TO COVID-19 - PATIENT WAS NOT SEEN IN THE OFFICE. PATIENT HAS CONSENTED TO VIRTUAL VISIT / TELEMEDICINE VISIT   Location of patient: home  Location of provider: office  I discussed the limitations of evaluation and management by telemedicine and the availability of in person appointments. The patient expressed understanding and agreed to proceed.   Subjective:   Ronald King is a 73 y.o. male who presents for Medicare Annual/Subsequent preventive examination.  Review of Systems: No ROS.  Medicare Wellness Virtual Visit.  Visual/audio telehealth visit, UTA vital signs.   See social history for additional risk factors.   Sleep patterns: Wears CPAP.  Home Safety/Smoke Alarms: Feels safe in home. Smoke alarms in place.  Lives in 1 story with wife.   Male:   CCS-   2010  PSA-  Lab Results  Component Value Date   PSA 3.09 08/11/2016   PSA 3.68 06/17/2016   PSA 3.51 12/18/2015       Objective:    Vitals: There were no vitals taken for this visit.  There is no height or weight on file to calculate BMI.  Advanced Directives 08/08/2017 02/23/2017 09/24/2015 05/26/2015  Does Patient Have a Medical Advance Directive? Yes Yes Yes Yes  Type of Paramedic of [place];Living will [place];Living will [place];Living will [place];Living will  Does patient want to make changes to medical advance directive? No - Patient declined - - -  Copy of [place] in Chart? No - copy requested - No - copy requested -    Tobacco Social History   Tobacco Use  Smoking Status Former Smoker  . Packs/day: 1.00  . Years: 3.00  . Pack years: 3.00   . Last attempt to quit: 03/29/1965  . Years since quitting: 53.4  Smokeless Tobacco Never Used     Counseling given: Not Answered   Clinical Intake:                       Past Medical History:  Diagnosis Date  . Anemia   . Anxiety   . Chronic bronchitis   . Chronic cough   . Diabetes ([place]) 07-2011   per Dr Buddy Duty  . DJD (degenerative joint disease)   . Dyslipidemia   . Esophageal polyp   . GERD (gastroesophageal reflux disease)    s/p [employer] F.  . Hemorrhoids, internal   . History of gastrointestinal hemorrhage   . Hypertension   . Hypogonadism, male    per Dr Buddy Duty  . Neuromuscular disorder ([facility])    neuropathy feet  . Neuropathy    per neuro-Dr Ferrarou vicodin  . OSA (obstructive sleep apnea)    on CPAP  . Sleep apnea    CPAP at night  . Vitamin D deficiency    Past Surgical History:  Procedure Laterality Date  . ANAL FISSURE REPAIR    . hand fracture surgery     left  . KNEE ARTHROSCOPY  2007   Dr. Marlou Sa  . LAPAROSCOPIC NISSEN FUNDOPLICATION  08/2374   Dr. Hassell Done  . NOSE SURGERY    . TONSILLECTOMY     Family History  Problem Relation Age  of Onset  . Cerebral aneurysm Father   . Alcohol abuse Father   . Neuropathy Mother   . Heart disease Mother        died at age 95  . Migraines Brother        several  . Diabetes Maternal Uncle   . CAD Brother        CABG in his 14s  . Colon cancer Neg Hx   . Prostate cancer Neg Hx    Social History   Socioeconomic History  . Marital status: Married    Spouse name: Not on file  . Number of children: 1  . Years of education: Not on file  . Highest education level: Not on file  Occupational History  . Occupation: Technical brewer, retired    Comment: retired  . Occupation: minister    Comment: [employer], [place]  . Financial resource strain: Not on file  . Food insecurity:    Worry: Not on file    Inability: Not on file  . Transportation needs:    Medical: Not on  file    Non-medical: Not on file  Tobacco Use  . Smoking status: Former Smoker    Packs/day: 1.00    Years: 3.00    Pack years: 3.00    Last attempt to quit: 03/29/1965    Years since quitting: 53.4  . Smokeless tobacco: Never Used  Substance and Sexual Activity  . Alcohol use: No  . Drug use: No  . Sexual activity: Not on file  Lifestyle  . Physical activity:    Days per week: Not on file    Minutes per session: Not on file  . Stress: Not on file  Relationships  . Social connections:    Talks on phone: Not on file    Gets together: Not on file    Attends religious service: Not on file    Active member of club or organization: Not on file    Attends meetings of clubs or organizations: Not on file    Relationship status: Not on file  Other Topics Concern  . Not on file  Social History Narrative   Lives w/ wife    Outpatient Encounter Medications as of 08/24/2018  Medication Sig  . amLODipine (NORVASC) 10 MG tablet Take 1 tablet (10 mg total) by mouth daily.  Marland Kitchen amoxicillin (AMOXIL) 500 MG tablet Take 1 tablet (500 mg total) by mouth 3 (three) times daily. Take as directed (Patient taking differently: Take 500 mg by mouth 3 (three) times daily. Take as directed for chronic bronchitis)  . ascorbic acid (VITAMIN C) 500 MG tablet Take 1,000 mg by mouth daily.   Marland Kitchen aspirin 81 MG tablet Take 81 mg by mouth daily.    . Calcium Carbonate-Vitamin D (RA CALCIUM PLUS VITAMIN D) 600-400 MG-UNIT per tablet Take 1 tablet by mouth daily.    . Cholecalciferol (VITAMIN D) 2000 UNITS tablet Take 2,000 Units by mouth daily.    . clonazePAM (KLONOPIN) 1 MG tablet Take 1 mg by mouth 2 (two) times daily as needed.    . Coenzyme Q10 (CO Q 10 PO) Take 1 tablet by mouth daily.   . Cyanocobalamin (B-12) 2000 MCG TABS Take 1 tablet by mouth daily.  . fenofibrate (TRICOR) 145 MG tablet Take 1 tablet (145 mg total) by mouth daily.  Marland Kitchen HYDROcodone-homatropine (HYCODAN) 5-1.5 MG/5ML syrup Take 5 mLs by mouth  every 6 (six) hours as needed for  cough.  . lansoprazole (PREVACID) 30 MG capsule TAKE 1 CAPSULE BY MOUTH TWICE A DAY BEFORE A MEAL  . metFORMIN (GLUCOPHAGE) 500 MG tablet Take by mouth 2 (two) times daily with a meal.  . Multiple Vitamins-Minerals (CENTRUM SILVER PO) Take 1 tablet by mouth daily. Reported on 03/17/2015  . PARoxetine (PAXIL) 20 MG tablet Take 20 mg by mouth daily.  Marland Kitchen pyridOXINE (VITAMIN B-6) 100 MG tablet Take 100 mg by mouth daily.  . rosuvastatin (CRESTOR) 20 MG tablet Take 1 tablet (20 mg total) by mouth every other day.   No facility-administered encounter medications on file as of 08/24/2018.     Activities of Daily Living No flowsheet data found.  Patient Care Team: Colon Branch, MD as PCP - General (Internal Medicine) Delrae Rend, MD as Consulting Physician (Endocrinology) Noralee Space, MD as Consulting Physician (Pulmonary Disease) Kerin Perna., MD as Consulting Physician (Neurology) Nickie Retort, MD as Consulting Physician (Urology) [facility])   Assessment:   This is a routine wellness examination for Angela. Physical assessment deferred to PCP.  Exercise Activities and Dietary recommendations   Diet (meal preparation, eat out, water intake, caffeinated beverages, dairy products, fruits and vegetables): {Desc; diets:16563} Breakfast: Lunch:  Dinner:      Goals    . Have 3 meals a day    . Increase physical activity    . Stay active       Fall Risk Fall Risk  08/08/2017 08/08/2017 07/29/2016 09/24/2015 09/12/2014  Falls in the past year? No No No Yes No  Number falls in past yr: - - - 1 -  Injury with Fall? - - - No -  Comment - - - Pt fell off a curb walking to get his new CPAP -    Depression Screen PHQ 2/9 Scores 08/08/2017 08/08/2017 07/29/2016 09/24/2015  PHQ - 2 Score 0 0 0 0    Cognitive Function MMSE - Mini Mental State Exam 09/24/2015  Orientation to time 5  Orientation to Place 5  Registration 3  Attention/  Calculation 5  Recall 3  Language- name 2 objects 2  Language- repeat 1  Language- follow 3 step command 3  Language- read & follow direction 1  Write a sentence 1  Copy design 1  Total score 30        Immunization History  Administered Date(s) Administered  . Hepatitis A 11/12/2005, 12/16/2005, 03/06/2015  . Hepatitis B 11/12/2005, 12/16/2005, 03/06/2015  . IPV 11/12/2005  . Influenza Split 01/28/2011, 12/16/2011  . Influenza Whole 12/25/2008  . Influenza, High Dose Seasonal PF 01/09/2015, 12/13/2016  . Influenza,inj,Quad PF,6+ Mos 01/10/2014  . Influenza-Unspecified 11/27/2012, 12/29/2017  . Pneumococcal Conjugate-13 09/06/2013  . Pneumococcal Polysaccharide-23 12/16/2011  . Td 09/24/2015  . Tdap 12/16/2005  . Typhoid Live 11/12/2005  . Yellow Fever 11/12/2005  . Zoster 12/15/2012  . Zoster Recombinat (Shingrix) 12/29/2017, 02/24/2018    Screening Tests Health Maintenance  Topic Date Due  . HEMOGLOBIN A1C  12/18/2016  . FOOT EXAM  07/29/2017  . URINE MICROALBUMIN  07/29/2017  . OPHTHALMOLOGY EXAM  08/19/2018  . COLONOSCOPY  08/31/2018  . INFLUENZA VACCINE  10/28/2018  . TETANUS/TDAP  09/23/2025  . Hepatitis C Screening  Completed  . PNA vac Low Risk Adult  Completed     Plan:   ***  I have personally reviewed and noted the following in the patient's chart:   . Medical and social history . Use of alcohol, tobacco or  illicit drugs  . Current medications and supplements . Functional ability and status . Nutritional status . Physical activity . Advanced directives . List of other physicians . Hospitalizations, surgeries, and ER visits in previous 12 months . Vitals . Screenings to include cognitive, depression, and falls . Referrals and appointments  In addition, I have reviewed and discussed with patient certain preventive protocols, quality metrics, and best practice recommendations. A written personalized care plan for preventive services as well as  general preventive health recommendations were provided to patient.     Naaman Plummer [place], [place]  08/23/2018

## 2018-08-24 ENCOUNTER — Encounter: Payer: Medicare Other | Admitting: Internal Medicine

## 2018-08-24 ENCOUNTER — Ambulatory Visit: Payer: Medicare Other | Admitting: *Deleted

## 2018-08-28 ENCOUNTER — Telehealth: Payer: Self-pay | Admitting: Gastroenterology

## 2018-08-28 MED ORDER — SUCRALFATE 1 G PO TABS: 1.0000 g | ORAL_TABLET | Freq: Two times a day (BID) | ORAL | 1 refills | Status: DC

## 2018-08-28 NOTE — Telephone Encounter (Signed)
Prescription of carafate sent to [facility]

## 2018-08-28 NOTE — Telephone Encounter (Signed)
Pt requested prescription for carafate to be sent to [facility] as per 08/14/18 virtual visit.

## 2018-09-15 ENCOUNTER — Encounter: Payer: Self-pay | Admitting: Gastroenterology

## 2018-09-25 ENCOUNTER — Other Ambulatory Visit: Payer: Self-pay | Admitting: Gastroenterology

## 2018-10-02 ENCOUNTER — Other Ambulatory Visit: Payer: Self-pay | Admitting: Gastroenterology

## 2018-10-12 ENCOUNTER — Other Ambulatory Visit: Payer: Self-pay | Admitting: Pulmonary Disease

## 2018-10-12 DIAGNOSIS — R053 Chronic cough: Secondary | ICD-10-CM

## 2018-10-12 DIAGNOSIS — R05 Cough: Secondary | ICD-10-CM

## 2018-10-12 MED ORDER — HYDROCODONE-HOMATROPINE 5-1.5 MG/5ML PO SYRP: 5.0000 mL | ORAL_SOLUTION | Freq: Four times a day (QID) | ORAL | 0 refills | Status: DC | PRN

## 2018-10-12 NOTE — Telephone Encounter (Signed)
Please send as med request. Thanks.  

## 2018-10-12 NOTE — Telephone Encounter (Signed)
Pt calling back to check on prescription

## 2018-10-12 NOTE — Telephone Encounter (Signed)
rx has been sent to pharmacy.  Pt aware.  Nothing further needed at this time- will close encounter.

## 2018-10-12 NOTE — Telephone Encounter (Signed)
Spoke with pt, requesting a refill on Hycodan.  Pt states this is the only thing that helps him with his chronic bronchitis.   Last refill: 07/10/2018 #269mL with 0 refills, take 5 mL q6h prn cough. Last OV: 07/10/2018 Next OV: none- recall in chart for 09/2018 with VS  Pharmacy: [facility] on [address].   Sending to Kenney Houseman- this is a former SN pt waiting to be re-established with a MD.  Thanks!

## 2018-10-12 NOTE — Telephone Encounter (Signed)
Pt really needs the [facility] called into [facility] on [address] could someone please let him know when it is sent to the pharmacy

## 2018-10-19 ENCOUNTER — Telehealth: Payer: Self-pay | Admitting: Nurse Practitioner

## 2018-10-19 ENCOUNTER — Other Ambulatory Visit: Payer: Self-pay | Admitting: Pulmonary Disease

## 2018-10-19 DIAGNOSIS — R053 Chronic cough: Secondary | ICD-10-CM

## 2018-10-19 DIAGNOSIS — R05 Cough: Secondary | ICD-10-CM

## 2018-10-19 LAB — LIPID PANEL
Cholesterol: 156 (ref 0–200)
HDL: 34 — AB (ref 35–70)
LDL Cholesterol: 97
LDl/HDL Ratio: 4.5
Triglycerides: 124 (ref 40–160)

## 2018-10-19 LAB — HEMOGLOBIN A1C: Hemoglobin A1C: 6.3

## 2018-10-19 LAB — HM DIABETES FOOT EXAM

## 2018-10-19 NOTE — Telephone Encounter (Signed)
Primary Pulmonologist: former SN pt Last office visit and with whom: 07/10/2018 with Wyn Quaker What do we see them for (pulmonary problems): chronic cough Last OV assessment/plan: Instructions    Return in about 3 months (around 10/09/2018), or if symptoms worsen or fail to improve, for Follow up with Dr. Halford Chessman, Follow up for PFT. Refilled Amoxicillin   Refilled Hycodan cough syrup  I have placed an order for you to complete a pulmonary function test prior to your next office visit with Dr. Halford Chessman  Our new address and phone number are:  [address] [place], [place] [address] Telephone number: [phone]  We recommend that you continue using your CPAP daily >>>Keep up the hard work using your device >>> Goal should be wearing this for the entire night that you are sleeping, at least 4 to 6 hours  Remember:   Do not drive or operate heavy machinery if tired or drowsy.   Please notify the supply company and office if you are unable to use your device regularly due to missing supplies or machine being broken.   Work on maintaining a healthy weight and following your recommended nutrition plan   Maintain proper daily exercise and movement   Maintaining proper use of your device can also help improve management of other chronic illnesses such as: Blood pressure, blood sugars, and weight management.   BiPAP/ CPAP Cleaning:  >>>Clean weekly, with Dawn soap, and bottle brush.  Set up to air dry.     Return in about 3 months (around 10/09/2018), or if symptoms worsen or fail to improve, for Follow up with Dr. Halford Chessman.     Was appointment offered to patient (explain)?  Pt requesting refill of amoxicillin 500mg    Reason for call: called and spoke with pt who stated that he is having a flare up with pt who states his bronchitis has flared up again and stated he has productive cough but is unable to get any mucus up. Pt stated he is wearing his CPAP at night but when he  starts coughing during the night, he will take his CPAP off.  Pt is still taking the hycodan cough syrup as he states is the only med that will help with his cough.   Pt denies any complaints of fever. Pt denies any complaints of wheezing or chest tightness but states he will develop some occ SOB.   Pt stated that OTC meds do not help when he has a flare up with the bronchitis and this is why he is requesting to have a refill of amoxicillin. Aaron Edelman, please advise on this for pt. Thanks!

## 2018-10-19 NOTE — Telephone Encounter (Signed)
LMTCB. Please schedule patient an appt per B. Mack.

## 2018-10-19 NOTE — Telephone Encounter (Signed)
Has the patient been scheduled with Dr. Halford Chessman.  I not seeing anything scheduled.  Does not wish to have any upcoming openings.  Patient needs to be scheduled.  Aaron Edelman

## 2018-10-19 NOTE — Telephone Encounter (Signed)
Pt is calling back [phone]

## 2018-10-19 NOTE — Telephone Encounter (Signed)
Pt is very upset does not understand why he would need to speak with BM on the phone about this again he would like for the medicine to be called in He said that he would be able to take calls around 4:45 on his cell phone. He said he has been trying to get in with dr sood.

## 2018-10-19 NOTE — Telephone Encounter (Signed)
Call returned to patient, made aware he needs an OV appt. New Patient Appt made with VS. Patient asked that I ask Dr. Halford Chessman to refill his medication until he makes his OV. I made him aware I would route message to VS.   VS please advise, if we can refill patient Amoxicillin until his OV Aug 19th.

## 2018-10-19 NOTE — Telephone Encounter (Signed)
LMTCB

## 2018-10-20 MED ORDER — AMOXICILLIN 500 MG PO TABS: 500.0000 mg | ORAL_TABLET | Freq: Three times a day (TID) | ORAL | 0 refills | Status: DC

## 2018-10-20 NOTE — Telephone Encounter (Signed)
Amoxicillin refilled as previously prescribed. Pt aware.   Nothing further needed at this time- will close encounter.

## 2018-10-20 NOTE — Telephone Encounter (Signed)
Okay to refill amoxicillin 

## 2018-10-26 ENCOUNTER — Other Ambulatory Visit: Payer: Self-pay

## 2018-10-26 ENCOUNTER — Ambulatory Visit: Payer: Medicare Other | Admitting: *Deleted

## 2018-10-26 VITALS — Ht 73.0 in | Wt 264.0 lb

## 2018-10-26 DIAGNOSIS — Z1211 Encounter for screening for malignant neoplasm of colon: Secondary | ICD-10-CM

## 2018-10-26 MED ORDER — SUPREP BOWEL PREP KIT 17.5-3.13-1.6 GM/177ML PO SOLN: 1.0000 | Freq: Once | ORAL | 0 refills | Status: AC

## 2018-10-26 NOTE — Progress Notes (Addendum)
Previsit via telephone related to [place] 19 pandemic. ID via name, DOB and address.  No egg or soy allergy known to patient  No issues with past sedation with any surgeries  or procedures, no intubation problems  No diet pills per patient No home 02 use per patient  No blood thinners per patient  Pt denies issues with constipation  No A fib or A flutter  EMMI information,consent, acknowledgement form and stamped envelope for return, Suprep coupon for [facility] and instructions mailed to the patient. Patient aware.

## 2018-10-27 NOTE — Progress Notes (Signed)
Reviewed and agree with assessment/plan.   Claudis Giovanelli, MD [facility] 03/24/2016, 12:24 PM Pager:  [phone]  

## 2018-11-06 ENCOUNTER — Encounter: Payer: Self-pay | Admitting: Internal Medicine

## 2018-11-07 ENCOUNTER — Telehealth: Payer: Self-pay | Admitting: Gastroenterology

## 2018-11-07 NOTE — Telephone Encounter (Signed)

## 2018-11-08 ENCOUNTER — Encounter: Payer: Self-pay | Admitting: Gastroenterology

## 2018-11-08 ENCOUNTER — Other Ambulatory Visit: Payer: Self-pay

## 2018-11-08 ENCOUNTER — Encounter: Payer: Medicare Other | Admitting: Gastroenterology

## 2018-11-08 ENCOUNTER — Ambulatory Visit (AMBULATORY_SURGERY_CENTER): Payer: Medicare Other | Admitting: Gastroenterology

## 2018-11-08 VITALS — BP 125/77 | HR 65 | Temp 98.9°F | Resp 14 | Ht 72.0 in | Wt 264.0 lb

## 2018-11-08 DIAGNOSIS — Z1211 Encounter for screening for malignant neoplasm of colon: Secondary | ICD-10-CM | POA: Diagnosis not present

## 2018-11-08 DIAGNOSIS — D120 Benign neoplasm of cecum: Secondary | ICD-10-CM

## 2018-11-08 DIAGNOSIS — D122 Benign neoplasm of ascending colon: Secondary | ICD-10-CM | POA: Diagnosis not present

## 2018-11-08 DIAGNOSIS — D123 Benign neoplasm of transverse colon: Secondary | ICD-10-CM

## 2018-11-08 MED ORDER — SODIUM CHLORIDE 0.9 % IV SOLN: 500.0000 mL | Freq: Once | INTRAVENOUS | Status: DC

## 2018-11-08 NOTE — Progress Notes (Signed)
A/ox3, pleased with MAC, report to RN 

## 2018-11-08 NOTE — Op Note (Signed)
[facility] Patient Name: Ronald King Procedure Date: 11/08/2018 1:55 PM MRN: [id number] Endoscopist: Mauri Pole , MD Age: 73 Referring MD:  Date of Birth: 10/30/1945 Gender: Male Account #: [id number] Procedure:                Colonoscopy Indications:              Screening for colorectal malignant neoplasm Medicines:                Monitored Anesthesia Care Procedure:                Pre-Anesthesia Assessment:                           - Prior to the procedure, a History and Physical                            was performed, and patient medications and                            allergies were reviewed. The patient's tolerance of                            previous anesthesia was also reviewed. The risks                            and benefits of the procedure and the sedation                            options and risks were discussed with the patient.                            All questions were answered, and informed consent                            was obtained. Prior Anticoagulants: The patient has                            taken no previous anticoagulant or antiplatelet                            agents. ASA Grade Assessment: II - A patient with                            mild systemic disease. After reviewing the risks                            and benefits, the patient was deemed in                            satisfactory condition to undergo the procedure.                           After obtaining informed consent, the colonoscope  was passed under direct vision. Throughout the                            procedure, the patient's blood pressure, pulse, and                            oxygen saturations were monitored continuously. The                            Colonoscope was introduced through the anus and                            advanced to the the cecum, identified by                            appendiceal orifice  and ileocecal valve. The                            colonoscopy was performed without difficulty. The                            patient tolerated the procedure well. The quality                            of the bowel preparation was excellent. The                            ileocecal valve, appendiceal orifice, and rectum                            were photographed. Scope In: 2:02:20 PM Scope Out: 2:27:05 PM Scope Withdrawal Time: 0 hours 17 minutes 59 seconds  Total Procedure Duration: 0 hours 24 minutes 45 seconds  Findings:                 The perianal and digital rectal examinations were                            normal.                           Two sessile polyps were found in the transverse                            colon and cecum. The polyps were 1 to 2 mm in size.                            These polyps were removed with a cold biopsy                            forceps. Resection and retrieval were complete.                           Five sessile polyps were found in the transverse  colon, ascending colon and cecum. The polyps were 4                            to 7 mm in size. These polyps were removed with a                            cold snare. Resection and retrieval were complete.                           A few small-mouthed diverticula were found in the                            sigmoid colon and descending colon.                           Non-bleeding internal hemorrhoids were found during                            retroflexion. The hemorrhoids were small. Complications:            No immediate complications. Estimated Blood Loss:     Estimated blood loss was minimal. Impression:               - Two 1 to 2 mm polyps in the transverse colon and                            in the cecum, removed with a cold biopsy forceps.                            Resected and retrieved.                           - Five 4 to 7 mm polyps in the transverse  colon, in                            the ascending colon and in the cecum, removed with                            a cold snare. Resected and retrieved.                           - Diverticulosis in the sigmoid colon and in the                            descending colon.                           - Non-bleeding internal hemorrhoids. Recommendation:           - Patient has a contact number available for                            emergencies. The signs and symptoms of potential  delayed complications were discussed with the                            patient. Return to normal activities tomorrow.                            Written discharge instructions were provided to the                            patient.                           - Resume previous diet.                           - Continue present medications.                           - Await pathology results.                           - Repeat colonoscopy in 3 years for surveillance                            based on pathology results. Mauri Pole, MD 11/08/2018 2:32:48 PM This report has been signed electronically.

## 2018-11-08 NOTE — Progress Notes (Signed)
Called to room to assist during endoscopic procedure.  Patient ID and intended procedure confirmed with present staff. Received instructions for my participation in the procedure from the performing physician.  

## 2018-11-08 NOTE — Patient Instructions (Signed)
YOU HAD AN ENDOSCOPIC PROCEDURE TODAY AT [facility]:   Refer to the procedure report that was given to you for any specific questions about what was found during the examination.  If the procedure report does not answer your questions, please call your gastroenterologist to clarify.  If you requested that your care partner not be given the details of your procedure findings, then the procedure report has been included in a sealed envelope for you to review at your convenience later.  **Handout given on Polyps, hemorrhoids, and diverticulosis**  **No NSAIDS, motrin, ibuprofen, advil for 2 weeks**   YOU SHOULD EXPECT: Some feelings of bloating in the abdomen. Passage of more gas than usual.  Walking can help get rid of the air that was put into your GI tract during the procedure and reduce the bloating. If you had a lower endoscopy (such as a colonoscopy or flexible sigmoidoscopy) you may notice spotting of blood in your stool or on the toilet paper. If you underwent a bowel prep for your procedure, you may not have a normal bowel movement for a few days.  Please Note:  You might notice some irritation and congestion in your nose or some drainage.  This is from the oxygen used during your procedure.  There is no need for concern and it should clear up in a day or so.  SYMPTOMS TO REPORT IMMEDIATELY:   Following lower endoscopy (colonoscopy or flexible sigmoidoscopy):  Excessive amounts of blood in the stool  Significant tenderness or worsening of abdominal pains  Swelling of the abdomen that is new, acute  Fever of 100F or higher    For urgent or emergent issues, a gastroenterologist can be reached at any hour by calling [phone].   DIET:  We do recommend a small meal at first, but then you may proceed to your regular diet.  Drink plenty of fluids but you should avoid alcoholic beverages for 24 hours.  ACTIVITY:  You should plan to take it easy for the rest of today  and you should NOT DRIVE or use heavy machinery until tomorrow (because of the sedation medicines used during the test).    FOLLOW UP: Our staff will call the number listed on your records 48-72 hours following your procedure to check on you and address any questions or concerns that you may have regarding the information given to you following your procedure. If we do not reach you, we will leave a message.  We will attempt to reach you two times.  During this call, we will ask if you have developed any symptoms of COVID 19. If you develop any symptoms (ie: fever, flu-like symptoms, shortness of breath, cough etc.) before then, please call [phone].  If you test positive for Covid 19 in the 2 weeks post procedure, please call and report this information to [place].    If any biopsies were taken you will be contacted by phone or by letter within the next 1-3 weeks.  Please call us at [phone] if you have not heard about the biopsies in 3 weeks.    SIGNATURES/CONFIDENTIALITY: You and/or your care partner have signed paperwork which will be entered into your electronic medical record.  These signatures attest to the fact that that the information above on your After Visit Summary has been reviewed and is understood.  Full responsibility of the confidentiality of this discharge information lies with you and/or your care-partner.

## 2018-11-08 NOTE — Progress Notes (Signed)
Pt's states no medical or surgical changes since previsit or office visit  Vitals and temp CW

## 2018-11-09 ENCOUNTER — Encounter: Payer: Self-pay | Admitting: Gastroenterology

## 2018-11-10 ENCOUNTER — Telehealth: Payer: Self-pay | Admitting: *Deleted

## 2018-11-10 NOTE — Telephone Encounter (Signed)
  Follow up Call-  Call back number 11/08/2018 02/23/2017  Post procedure Call Back phone  # [phone]  Permission to leave phone message No Yes  comments call home phone if not able to get answer -  Some recent data might be hidden     Patient questions:  Do you have a fever, pain , or abdominal swelling? No. Pain Score  0 *  Have you tolerated food without any problems? Yes.    Have you been able to return to your normal activities? Yes.    Do you have any questions about your discharge instructions: Diet   No. Medications  No. Follow up visit  No.  Do you have questions or concerns about your Care? No.  Actions: * If pain score is 4 or above: No action needed, pain <4. 1. Have you developed a fever since your procedure? no  2.   Have you had an respiratory symptoms (SOB or cough) since your procedure? no  3.   Have you tested positive for COVID 19 since your procedure no  4.   Have you had any family members/close contacts diagnosed with the COVID 19 since your procedure?  no   If yes to any of these questions please route to Joylene John, RN and Alphonsa Gin, Therapist, sports.  ,

## 2018-11-15 ENCOUNTER — Ambulatory Visit: Payer: Medicare Other | Admitting: Pulmonary Disease

## 2018-11-15 ENCOUNTER — Ambulatory Visit (INDEPENDENT_AMBULATORY_CARE_PROVIDER_SITE_OTHER): Payer: Medicare Other

## 2018-11-15 ENCOUNTER — Other Ambulatory Visit: Payer: Self-pay

## 2018-11-15 ENCOUNTER — Encounter: Payer: Self-pay | Admitting: Pulmonary Disease

## 2018-11-15 VITALS — BP 126/78 | HR 67 | Temp 98.6°F | Ht 74.0 in | Wt 264.0 lb

## 2018-11-15 DIAGNOSIS — G4733 Obstructive sleep apnea (adult) (pediatric): Secondary | ICD-10-CM

## 2018-11-15 DIAGNOSIS — R053 Chronic cough: Secondary | ICD-10-CM

## 2018-11-15 DIAGNOSIS — R05 Cough: Secondary | ICD-10-CM

## 2018-11-15 LAB — CBC WITH DIFFERENTIAL/PLATELET
Basophils Absolute: 0 10*3/uL (ref 0.0–0.1)
Basophils Relative: 0.7 % (ref 0.0–3.0)
Eosinophils Absolute: 0.2 10*3/uL (ref 0.0–0.7)
Eosinophils Relative: 3.6 % (ref 0.0–5.0)
HCT: 39.6 % (ref 39.0–52.0)
Hemoglobin: 13.6 g/dL (ref 13.0–17.0)
Lymphocytes Relative: 22.7 % (ref 12.0–46.0)
Lymphs Abs: 1.4 10*3/uL (ref 0.7–4.0)
MCHC: 34.2 g/dL (ref 30.0–36.0)
MCV: 90.3 fl (ref 78.0–100.0)
Monocytes Absolute: 0.7 10*3/uL (ref 0.1–1.0)
Monocytes Relative: 10.3 % (ref 3.0–12.0)
Neutro Abs: 4 10*3/uL (ref 1.4–7.7)
Neutrophils Relative %: 62.7 % (ref 43.0–77.0)
Platelets: 225 10*3/uL (ref 150.0–400.0)
RBC: 4.38 Mil/uL (ref 4.22–5.81)
RDW: 13.6 % (ref 11.5–15.5)
WBC: 6.4 10*3/uL (ref 4.0–10.5)

## 2018-11-15 MED ORDER — AMOXICILLIN 500 MG PO TABS: 500.0000 mg | ORAL_TABLET | Freq: Three times a day (TID) | ORAL | 0 refills | Status: DC

## 2018-11-15 MED ORDER — HYDROCODONE-HOMATROPINE 5-1.5 MG/5ML PO SYRP: 5.0000 mL | ORAL_SOLUTION | Freq: Four times a day (QID) | ORAL | 0 refills | Status: DC | PRN

## 2018-11-15 NOTE — Patient Instructions (Signed)
Lab tests and chest xray today Your prescriptions have been sent to your pharmacy Will schedule pulmonary function test  Follow up in 6 to 8 weeks after pulmonary function test completed

## 2018-11-15 NOTE — Progress Notes (Signed)
[facility]  Chief Complaint  Patient presents with  . New Patient (Initial Visit)    Former Programmer, systems patient. He reports he was told he has chronic bronchitis. Reports his cough is minimal and denies mucous.     Constitutional:  BP 126/78   Pulse 67   Temp 98.6 F (37 C) (Temporal)   Ht 6\' 2"  (1.88 m)   Wt 264 lb (119.7 kg)   SpO2 97%   BMI 33.90 kg/m   Past Medical History:  HTN, HLD, GERD, IBS, hemorrhoids, BPH, ED, DJD, HA, Vit D deficiency, Neuropathy, Anxiety  Brief Summary:  Ronald King is a 73 y.o. male former smoker with cough and obstructive sleep apnea.  Previously seen by Dr. Lenna Gilford.  He says he has cough for 40 years.  He was told he had arthritis and debris in his lungs.  He was advised that he should do anything he could to avoid coughing otherwise his lung could explode.  He has tried everything else for his cough.  The only thing that works if hycodan, and amoxicillin if he gets more congested.  He will use Zpak when his cough gets really bad.  He broke his nose as a child.  Has trouble breathing through his nose since.  Had surgery once, but didn't fix the problem.  He was told by a surgeon he could have repeat surgery, but then would have to get surgery every 2 years.  He decided to use afrin instead. He is aware of concern for rebound rhinitis, but feels nothing else can work besides afrin.  He has tried other nose sprays and saline irrigation.  He uses CPAP.  Feels pressure is okay.  Mask will leak sometimes, and he is worried if pressure is too high, then mask will leak more.  He is aware that his sleep study in 1999 didn't show sleep apnea, but says a letter was written saying he has a sleep disordered and had trouble staying awake while driving, and was then able to get CPAP set up.    CXR 12/06/17 (reviewed by me) - normal.  Physical Exam:   Appearance - well kempt   ENMT - clear nasal mucosa, deviated nasal   septum, no oral exudates, no LAN, trachea midline  Respiratory - normal chest wall, normal respiratory effort, no accessory muscle use, no wheeze/rales  CV - s1s2 regular rate and rhythm, no murmurs, no peripheral edema, radial pulses symmetric  GI - soft, non tender, no masses  Lymph - no adenopathy noted in neck and axillary areas  MSK - normal gait  Ext - no cyanosis, clubbing, or joint inflammation noted  Skin - no rashes, lesions, or ulcers  Neuro - normal strength, oriented x 3  Psych - normal mood and affect   Assessment/Plan:   Chronic cough. - upper airway cough, GERD - will need to reassess status of this - will arrange for RAST, CBC with diff, CXR, and PFT - refilled amoxicillin and hycodan for now; explained that it would be better to identify the cause of his cough and treat this, rather than mask it with cough medicines  Upper airway cough with deviated nasal septum. - previously seen by Dr. Janace Hoard with ENT - he prefers to continue with daily afrin use - advised him to also try nasal irrigation  Sleep apnea, NOS. - he is compliant with CPAP and reports benefit - his AHI is still elevated, but don't think he  would be able to tolerate higher pressures - continue CPAP 16 cm H2O  GERD. - followed by Dr. Silverio Decamp with GI  Patient Instructions  Lab tests and chest xray today Your prescriptions have been sent to your pharmacy Will schedule pulmonary function test  Follow up in 6 to 8 weeks after pulmonary function test completed   A total of  29 minutes were spent face to face with the patient and more than half of that time involved counseling or coordination of care.   Chesley Mires, MD [facility] Pager: [phone] 11/15/2018, 1:21 PM  Flow Sheet     Pulmonary tests:  Spirometry September 2013 >> FEV 1 3.46 (86%), FEV1% 87  Sleep tests:  PSG 09/02/97 >> AHI 3 CPAP 10/16/18 to 11/14/18 >> used on 30 of 30 nights with average  8 hrs 23 min.  Average AHI 11 with CPAP 16 cm H2O  Medications:   Allergies as of 11/15/2018      Reactions   Montelukast Shortness Of Breath   Ceftin [cefuroxime Axetil]    Duloxetine    Other reaction(s): Other (See Comments) refuses   Lyrica [pregabalin] Other (See Comments)   Drowsiness   Neurontin [gabapentin] Other (See Comments)   Drowsiness       Medication List       Accurate as of November 15, 2018  1:21 PM. If you have any questions, ask your nurse or doctor.        STOP taking these medications   Vitamin D 50 MCG (2000 UT) tablet Stopped by: Chesley Mires, MD     TAKE these medications   amLODipine 10 MG tablet Commonly known as: NORVASC Take 1 tablet (10 mg total) by mouth daily.   amoxicillin 500 MG tablet Commonly known as: AMOXIL Take 1 tablet (500 mg total) by mouth 3 (three) times daily.   ascorbic acid 500 MG tablet Commonly known as: VITAMIN C Take 1,000 mg by mouth daily.   aspirin 81 MG tablet Take 81 mg by mouth daily.   B-12 2000 MCG Tabs Take 1 tablet by mouth daily.   CENTRUM SILVER PO Take 1 tablet by mouth daily. Reported on 03/17/2015   clonazePAM 1 MG tablet Commonly known as: KLONOPIN Take 1 mg by mouth 2 (two) times daily.   CO Q 10 PO Take 1 tablet by mouth daily.   fenofibrate 145 MG tablet Commonly known as: TRICOR Take 1 tablet (145 mg total) by mouth daily.   HYDROcodone-homatropine 5-1.5 MG/5ML syrup Commonly known as: HYCODAN Take 5 mLs by mouth every 6 (six) hours as needed for cough.   lansoprazole 30 MG capsule Commonly known as: PREVACID TAKE 1 CAPSULE BY MOUTH TWICE DAILY   metFORMIN 500 MG 24 hr tablet Commonly known as: GLUCOPHAGE-XR TK 4 TS PO QD WITH THE EVE MEAL   PARoxetine 20 MG tablet Commonly known as: PAXIL Take 20 mg by mouth daily.   pyridOXINE 100 MG tablet Commonly known as: VITAMIN B-6 Take 100 mg by mouth daily.   RA Calcium Plus Vitamin D 600-400 MG-UNIT tablet Generic drug:  Calcium Carbonate-Vitamin D Take 1 tablet by mouth daily.   rosuvastatin 20 MG tablet Commonly known as: CRESTOR Take 1 tablet (20 mg total) by mouth every other day.   sucralfate 1 g tablet Commonly known as: CARAFATE TAKE 1 TABLET(1 GRAM) BY MOUTH TWICE DAILY What changed: See the new instructions.       Past Surgical History:  He  has  a past surgical history that includes Laparoscopic Nissen fundoplication (07/9933); Knee arthroscopy (2007); Tonsillectomy; Nose surgery; Anal fissure repair; hand fracture surgery; and Upper gastrointestinal endoscopy.  Family History:  His family history includes Alcohol abuse in his father; CAD in his brother; Cerebral aneurysm in his father; Diabetes in his maternal uncle; Heart disease in his mother; Migraines in his brother; Neuropathy in his mother.  Social History:  He  reports that he quit smoking about 53 years ago. He has a 3.00 pack-year smoking history. He has never used smokeless tobacco. He reports that he does not drink alcohol or use drugs.

## 2018-11-16 LAB — RESPIRATORY ALLERGY PROFILE REGION II ~~LOC~~
Allergen, A. alternata, m6: <0.1 kU/L
Allergen, Cedar tree, t12: <0.1 kU/L
Allergen, Comm Silver Birch, t9: <0.1 kU/L
Allergen, Cottonwood, t14: <0.1 kU/L
Allergen, D pternoyssinus,d7: <0.1 kU/L
Allergen, Mouse Urine Protein, e78: <0.1 kU/L
Allergen, Mulberry, t76: <0.1 kU/L
Allergen, Oak,t7: <0.1 kU/L
Allergen, P. notatum, m1: <0.1 kU/L
Aspergillus fumigatus, m3: <0.1 kU/L
Bermuda Grass: 0.13 kU/L — ABNORMAL HIGH
Box Elder IgE: <0.1 kU/L
CLADOSPORIUM HERBARUM (M2) IGE: <0.1 kU/L
COMMON RAGWEED (SHORT) (W1) IGE: <0.1 kU/L
Cat Dander: <0.1 kU/L
Class: 0
Class: 0
Class: 0
Class: 0
Class: 0
Class: 0
Class: 0
Class: 0
Class: 0
Class: 0
Class: 0
Class: 0
Class: 0
Class: 0
Class: 0
Class: 0
Class: 0
Class: 0
Class: 0
Class: 0
Class: 0
Class: 0
Class: 0
Class: 2
Cockroach: <0.1 kU/L
D. farinae: <0.1 kU/L
Dog Dander: <0.1 kU/L
Elm IgE: <0.1 kU/L
IgE (Immunoglobulin E), Serum: 87 kU/L (ref <=114)
Johnson Grass: 0.2 kU/L — ABNORMAL HIGH
Pecan/Hickory Tree IgE: <0.1 kU/L
Rough Pigweed  IgE: <0.1 kU/L
Sheep Sorrel IgE: <0.1 kU/L
Timothy Grass: 1 kU/L — ABNORMAL HIGH

## 2018-11-16 LAB — INTERPRETATION:

## 2018-11-17 ENCOUNTER — Telehealth: Payer: Self-pay | Admitting: Pulmonary Disease

## 2018-11-17 NOTE — Telephone Encounter (Signed)
All returned to patient, made aware of results per VS. Voiced understanding. Nothing further needed at this time.

## 2018-11-17 NOTE — Telephone Encounter (Signed)
Dg Chest 2 View  Result Date: 11/15/2018 CLINICAL DATA:  Chronic cough. EXAM: CHEST - 2 VIEW COMPARISON:  Radiographs of December 06, 2017. FINDINGS: The heart size and mediastinal contours are within normal limits. Both lungs are clear. The visualized skeletal structures are unremarkable. IMPRESSION: No active cardiopulmonary disease. Electronically Signed   By: Marijo Conception M.D.   On: 11/15/2018 14:28    CBC    Component Value Date/Time   WBC 6.4 11/15/2018 1146   RBC 4.38 11/15/2018 1146   HGB 13.6 11/15/2018 1146   HCT 39.6 11/15/2018 1146   PLT 225.0 11/15/2018 1146   MCV 90.3 11/15/2018 1146   MCHC 34.2 11/15/2018 1146   RDW 13.6 11/15/2018 1146   LYMPHSABS 1.4 11/15/2018 1146   MONOABS 0.7 11/15/2018 1146   EOSABS 0.2 11/15/2018 1146   BASOSABS 0.0 11/15/2018 1146    RAST 11/15/18 >> Timothy/Johnson/Bermuda grass, IgE 87    Please let him know his chest xray was normal.  His lab tests show allergies to different types of grasses.  Will call him back after review of PFT.  No change to current treatment plan.

## 2018-11-20 ENCOUNTER — Encounter: Payer: Self-pay | Admitting: Gastroenterology

## 2018-12-07 ENCOUNTER — Other Ambulatory Visit: Payer: Self-pay | Admitting: Pulmonary Disease

## 2018-12-07 ENCOUNTER — Telehealth: Payer: Self-pay | Admitting: Pulmonary Disease

## 2018-12-07 NOTE — Telephone Encounter (Signed)
Called and spoke to patient. Scheduled COVID testing prior to PFT. Nothing further needed at this time.

## 2018-12-09 ENCOUNTER — Other Ambulatory Visit: Payer: Self-pay | Admitting: Internal Medicine

## 2018-12-13 NOTE — Progress Notes (Signed)
Virtual Visit via Video Note  I connected with patient on 12/14/18 at  1:00 PM EDT by audio enabled telemedicine application and verified that I am speaking with the correct person using two identifiers.   THIS ENCOUNTER IS A VIRTUAL VISIT DUE TO COVID-19 - PATIENT WAS NOT SEEN IN THE OFFICE. PATIENT HAS CONSENTED TO VIRTUAL VISIT / TELEMEDICINE VISIT   Location of patient: home  Location of provider: office  I discussed the limitations of evaluation and management by telemedicine and the availability of in person appointments. The patient expressed understanding and agreed to proceed.   Subjective:   Ronald King is a 73 y.o. male who presents for Medicare Annual/Subsequent preventive examination.  Review of Systems: No ROS.  Medicare Wellness Virtual Visit.  Visual/audio telehealth visit, UTA vital signs.   See social history for additional risk factors. Cardiac Risk Factors include: advanced age (>68men, >60 women);dyslipidemia;male gender;diabetes mellitus;obesity (BMI >30kg/m2) Home Safety/Smoke Alarms: Feels safe in home. Smoke alarms in place.  Lives with wife in 1 story home.   Male:   CCS-  11/08/18. Recall 3 yrs  PSA-  Lab Results  Component Value Date   PSA 3.09 08/11/2016   PSA 3.68 06/17/2016   PSA 3.51 12/18/2015       Objective:    Vitals:  Unable to assess. This visit is enabled though telemedicine due to Covid 19.   Advanced Directives 12/14/2018 08/08/2017 02/23/2017 09/24/2015 05/26/2015  Does Patient Have a Medical Advance Directive? Yes Yes Yes Yes Yes  Type of Paramedic of [place];Living will [place];Living will [place];Living will [place];Living will [place];Living will  Does patient want to make changes to medical advance directive? No - Patient declined No - Patient declined - - -  Copy of [place] in Chart? Yes -  validated most recent copy scanned in chart (See row information) No - copy requested - No - copy requested -    Tobacco Social History   Tobacco Use  Smoking Status Former Smoker  . Packs/day: 1.00  . Years: 3.00  . Pack years: 3.00  . Quit date: 03/29/1965  . Years since quitting: 53.7  Smokeless Tobacco Never Used     Counseling given: Not Answered   Clinical Intake: Pain : No/denies pain    Past Medical History:  Diagnosis Date  . Allergy   . Anemia   . Anxiety   . Blood transfusion without reported diagnosis   . Chronic bronchitis   . Chronic cough   . Diabetes ([place]) 07-2011   per Dr Buddy Duty  . DJD (degenerative joint disease)   . Dyslipidemia   . Esophageal polyp   . GERD (gastroesophageal reflux disease)    s/p [employer] F.  . Hemorrhoids, internal   . History of gastrointestinal hemorrhage   . Hypertension   . Hypogonadism, male    per Dr Buddy Duty  . Neuromuscular disorder ([facility])    neuropathy feet  . Neuropathy    per neuro-Dr Ferrarou vicodin  . OSA (obstructive sleep apnea)    on CPAP  . Sleep apnea    CPAP at night  . Vitamin D deficiency    Past Surgical History:  Procedure Laterality Date  . ANAL FISSURE REPAIR    . hand fracture surgery     left  . KNEE ARTHROSCOPY  2007   Dr. Marlou Sa  . LAPAROSCOPIC NISSEN FUNDOPLICATION  [id number]  Dr. Hassell Done  . NOSE SURGERY    . TONSILLECTOMY    . UPPER GASTROINTESTINAL ENDOSCOPY     Family History  Problem Relation Age of Onset  . Cerebral aneurysm Father   . Alcohol abuse Father   . Neuropathy Mother   . Heart disease Mother        died at age 91  . Migraines Brother        several  . Diabetes Maternal Uncle   . CAD Brother        CABG in his 46s  . Colon cancer Neg Hx   . Prostate cancer Neg Hx   . Esophageal cancer Neg Hx   . Stomach cancer Neg Hx   . Rectal cancer Neg Hx    Social History   Socioeconomic History  . Marital status: Married    Spouse name: Not on file  . Number of children: 1   . Years of education: Not on file  . Highest education level: Not on file  Occupational History  . Occupation: Technical brewer, retired    Comment: retired  . Occupation: minister    Comment: [employer], [place]  . Financial resource strain: Not on file  . Food insecurity    Worry: Not on file    Inability: Not on file  . Transportation needs    Medical: Not on file    Non-medical: Not on file  Tobacco Use  . Smoking status: Former Smoker    Packs/day: 1.00    Years: 3.00    Pack years: 3.00    Quit date: 03/29/1965    Years since quitting: 53.7  . Smokeless tobacco: Never Used  Substance and Sexual Activity  . Alcohol use: No  . Drug use: No  . Sexual activity: Not on file  Lifestyle  . Physical activity    Days per week: Not on file    Minutes per session: Not on file  . Stress: Not on file  Relationships  . Social Herbalist on phone: Not on file    Gets together: Not on file    Attends religious service: Not on file    Active member of club or organization: Not on file    Attends meetings of clubs or organizations: Not on file    Relationship status: Not on file  Other Topics Concern  . Not on file  Social History Narrative   Lives w/ wife    Outpatient Encounter Medications as of 12/14/2018  Medication Sig  . amLODipine (NORVASC) 10 MG tablet Take 1 tablet (10 mg total) by mouth daily.  Marland Kitchen amoxicillin (AMOXIL) 500 MG tablet Take 1 tablet (500 mg total) by mouth 3 (three) times daily.  Marland Kitchen ascorbic acid (VITAMIN C) 500 MG tablet Take 1,000 mg by mouth daily.   Marland Kitchen aspirin 81 MG tablet Take 81 mg by mouth daily.    . Calcium Carbonate-Vitamin D (RA CALCIUM PLUS VITAMIN D) 600-400 MG-UNIT per tablet Take 1 tablet by mouth daily.    . clonazePAM (KLONOPIN) 1 MG tablet Take 1 mg by mouth 2 (two) times daily.   . Coenzyme Q10 (CO Q 10 PO) Take 1 tablet by mouth daily.   . Cyanocobalamin (B-12) 2000 MCG TABS Take 1 tablet by mouth  daily.  . fenofibrate (TRICOR) 145 MG tablet Take 1 tablet (145 mg total) by mouth daily.  Marland Kitchen HYDROcodone-homatropine (HYCODAN) 5-1.5 MG/5ML syrup Take 5 mLs by mouth  every 6 (six) hours as needed for cough.  . lansoprazole (PREVACID) 30 MG capsule TAKE 1 CAPSULE BY MOUTH TWICE DAILY  . metFORMIN (GLUCOPHAGE-XR) 500 MG 24 hr tablet TK 4 TS PO QD WITH THE EVE MEAL  . Multiple Vitamins-Minerals (CENTRUM SILVER PO) Take 1 tablet by mouth daily. Reported on 03/17/2015  . PARoxetine (PAXIL) 20 MG tablet Take 20 mg by mouth daily.  Marland Kitchen pyridOXINE (VITAMIN B-6) 100 MG tablet Take 100 mg by mouth daily.  . rosuvastatin (CRESTOR) 20 MG tablet Take 1 tablet (20 mg total) by mouth every other day.  . sucralfate (CARAFATE) 1 g tablet TAKE 1 TABLET(1 GRAM) BY MOUTH TWICE DAILY (Patient taking differently: as needed. )   No facility-administered encounter medications on file as of 12/14/2018.     Activities of Daily Living In your present state of health, do you have any difficulty performing the following activities: 12/14/2018  Hearing? Y  Comment wears hearing aid in Left  Vision? N  Difficulty concentrating or making decisions? N  Walking or climbing stairs? N  Dressing or bathing? N  Doing errands, shopping? N  Preparing Food and eating ? N  Using the Toilet? N  In the past six months, have you accidently leaked urine? N  Do you have problems with loss of bowel control? N  Managing your Medications? N  Managing your Finances? N  Housekeeping or managing your Housekeeping? N  Some recent data might be hidden    Patient Care Team: Colon Branch, MD as PCP - General (Internal Medicine) Delrae Rend, MD as Consulting Physician (Endocrinology) Noralee Space, MD as Consulting Physician (Pulmonary Disease) Kerin Perna., MD as Consulting Physician (Neurology) Nickie Retort, MD as Consulting Physician (Urology) [facility])   Assessment:   This is a routine wellness  examination for Ronald King. Physical assessment deferred to PCP.  Exercise Activities and Dietary recommendations Current Exercise Habits: Home exercise routine, Type of exercise: walking, Frequency (Times/Week): 2, Intensity: Mild, Exercise limited by: None identified Diet (meal preparation, eat out, water intake, caffeinated beverages, dairy products, fruits and vegetables): in general, a "healthy" diet  , well balanced     Goals    . Stay active       Fall Risk Fall Risk  12/14/2018 08/08/2017 08/08/2017 07/29/2016 09/24/2015  Falls in the past year? 0 No No No Yes  Number falls in past yr: - - - - 1  Injury with Fall? - - - - No  Comment - - - - Pt fell off a curb walking to get his new CPAP    Depression Screen PHQ 2/9 Scores 12/14/2018 08/08/2017 08/08/2017 07/29/2016  PHQ - 2 Score 0 0 0 0    Cognitive Function Ad8 score reviewed for issues:  Issues making decisions:no  Less interest in hobbies / activities:no  Repeats questions, stories (family complaining):no  Trouble using ordinary gadgets (microwave, computer, phone):no  Forgets the month or year: no  Mismanaging finances: no  Remembering appts:no  Daily problems with thinking and/or memory:no Ad8 score is=0     MMSE - Mini Mental State Exam 09/24/2015  Orientation to time 5  Orientation to Place 5  Registration 3  Attention/ Calculation 5  Recall 3  Language- name 2 objects 2  Language- repeat 1  Language- follow 3 step command 3  Language- read & follow direction 1  Write a sentence 1  Copy design 1  Total score 30  Immunization History  Administered Date(s) Administered  . Hepatitis A 11/12/2005, 12/16/2005, 03/06/2015  . Hepatitis B 11/12/2005, 12/16/2005, 03/06/2015  . IPV 11/12/2005  . Influenza Split 01/28/2011, 12/16/2011  . Influenza Whole 12/25/2008  . Influenza, High Dose Seasonal PF 01/09/2015, 12/13/2016  . Influenza,inj,Quad PF,6+ Mos 01/10/2014  . Influenza-Unspecified  11/27/2012, 12/29/2017  . Pneumococcal Conjugate-13 09/06/2013  . Pneumococcal Polysaccharide-23 12/16/2011  . Td 09/24/2015  . Tdap 12/16/2005  . Typhoid Live 11/12/2005  . Yellow Fever 11/12/2005  . Zoster 12/15/2012  . Zoster Recombinat (Shingrix) 12/29/2017, 02/24/2018    Screening Tests Health Maintenance  Topic Date Due  . OPHTHALMOLOGY EXAM  08/19/2018  . INFLUENZA VACCINE  12/28/2018 (Originally 10/28/2018)  . HEMOGLOBIN A1C  04/21/2019  . URINE MICROALBUMIN  04/28/2019  . FOOT EXAM  10/19/2019  . COLONOSCOPY  11/07/2021  . TETANUS/TDAP  09/23/2025  . Hepatitis C Screening  Completed  . PNA vac Low Risk Adult  Completed        Plan:    Please schedule your next medicare wellness visit with me in 1 yr.  Continue to eat heart healthy diet (full of fruits, vegetables, whole grains, lean protein, water--limit salt, fat, and sugar intake) and increase physical activity as tolerated.  Continue doing brain stimulating activities (puzzles, reading, adult coloring books, staying active) to keep memory sharp.    I have personally reviewed and noted the following in the patient's chart:   . Medical and social history . Use of alcohol, tobacco or illicit drugs  . Current medications and supplements . Functional ability and status . Nutritional status . Physical activity . Advanced directives . List of other physicians . Hospitalizations, surgeries, and ER visits in previous 12 months . Vitals . Screenings to include cognitive, depression, and falls . Referrals and appointments  In addition, I have reviewed and discussed with patient certain preventive protocols, quality metrics, and best practice recommendations. A written personalized care plan for preventive services as well as general preventive health recommendations were provided to patient.     Shela Nevin, [place]  12/14/2018

## 2018-12-14 ENCOUNTER — Encounter: Payer: Self-pay | Admitting: *Deleted

## 2018-12-14 ENCOUNTER — Ambulatory Visit (INDEPENDENT_AMBULATORY_CARE_PROVIDER_SITE_OTHER): Payer: Medicare Other | Admitting: *Deleted

## 2018-12-14 ENCOUNTER — Other Ambulatory Visit: Payer: Self-pay

## 2018-12-14 DIAGNOSIS — Z0000 Encounter for general adult medical examination without abnormal findings: Secondary | ICD-10-CM

## 2018-12-14 NOTE — Patient Instructions (Signed)
Please schedule your next medicare wellness visit with me in 1 yr.  Continue to eat heart healthy diet (full of fruits, vegetables, whole grains, lean protein, water--limit salt, fat, and sugar intake) and increase physical activity as tolerated.  Continue doing brain stimulating activities (puzzles, reading, adult coloring books, staying active) to keep memory sharp.    Ronald King , Thank you for taking time to come for your Medicare Wellness Visit. I appreciate your ongoing commitment to your health goals. Please review the following plan we discussed and let me know if I can assist you in the future.   These are the goals we discussed: Goals    . Stay active       This is a list of the screening recommended for you and due dates:  Health Maintenance  Topic Date Due  . Eye exam for diabetics  08/19/2018  . Flu Shot  12/28/2018*  . Hemoglobin A1C  04/21/2019  . Urine Protein Check  04/28/2019  . Complete foot exam   10/19/2019  . Colon Cancer Screening  11/07/2021  . Tetanus Vaccine  09/23/2025  .  Hepatitis C: One time screening is recommended by Center for Disease Control  (CDC) for  adults born from [birth year] through 1964.   Completed  . Pneumonia vaccines  Completed  *Topic was postponed. The date shown is not the original due date.    Health Maintenance After Age 12 After age 55, you are at a higher risk for certain long-term diseases and infections as well as injuries from falls. Falls are a major cause of broken bones and head injuries in people who are older than age 91. Getting regular preventive care can help to keep you healthy and well. Preventive care includes getting regular testing and making lifestyle changes as recommended by your health care provider. Talk with your health care provider about:  Which screenings and tests you should have. A screening is a test that checks for a disease when you have no symptoms.  A diet and exercise plan that is right for you.  What should I know about screenings and tests to prevent falls? Screening and testing are the best ways to find a health problem early. Early diagnosis and treatment give you the best chance of managing medical conditions that are common after age 45. Certain conditions and lifestyle choices may make you more likely to have a fall. Your health care provider may recommend:  Regular vision checks. Poor vision and conditions such as cataracts can make you more likely to have a fall. If you wear glasses, make sure to get your prescription updated if your vision changes.  Medicine review. Work with your health care provider to regularly review all of the medicines you are taking, including over-the-counter medicines. Ask your health care provider about any side effects that may make you more likely to have a fall. Tell your health care provider if any medicines that you take make you feel dizzy or sleepy.  Osteoporosis screening. Osteoporosis is a condition that causes the bones to get weaker. This can make the bones weak and cause them to break more easily.  Blood pressure screening. Blood pressure changes and medicines to control blood pressure can make you feel dizzy.  Strength and balance checks. Your health care provider may recommend certain tests to check your strength and balance while standing, walking, or changing positions.  Foot health exam. Foot pain and numbness, as well as not wearing proper footwear, can make  you more likely to have a fall.  Depression screening. You may be more likely to have a fall if you have a fear of falling, feel emotionally low, or feel unable to do activities that you used to do.  Alcohol use screening. Using too much alcohol can affect your balance and may make you more likely to have a fall. What actions can I take to lower my risk of falls? General instructions  Talk with your health care provider about your risks for falling. Tell your health care  provider if: ? You fall. Be sure to tell your health care provider about all falls, even ones that seem minor. ? You feel dizzy, sleepy, or off-balance.  Take over-the-counter and prescription medicines only as told by your health care provider. These include any supplements.  Eat a healthy diet and maintain a healthy weight. A healthy diet includes low-fat dairy products, low-fat (lean) meats, and fiber from whole grains, beans, and lots of fruits and vegetables. Home safety  Remove any tripping hazards, such as rugs, cords, and clutter.  Install safety equipment such as grab bars in bathrooms and safety rails on stairs.  Keep rooms and walkways well-lit. Activity   Follow a regular exercise program to stay fit. This will help you maintain your balance. Ask your health care provider what types of exercise are appropriate for you.  If you need a cane or walker, use it as recommended by your health care provider.  Wear supportive shoes that have nonskid soles. Lifestyle  Do not drink alcohol if your health care provider tells you not to drink.  If you drink alcohol, limit how much you have: ? 0-1 drink a day for women. ? 0-2 drinks a day for men.  Be aware of how much alcohol is in your drink. In the [place] one drink equals one typical bottle of beer (12 oz), one-half glass of wine (5 oz), or one shot of hard liquor (1 oz).  Do not use any products that contain nicotine or tobacco, such as cigarettes and e-cigarettes. If you need help quitting, ask your health care provider. Summary  Having a healthy lifestyle and getting preventive care can help to protect your health and wellness after age 25.  Screening and testing are the best way to find a health problem early and help you avoid having a fall. Early diagnosis and treatment give you the best chance for managing medical conditions that are more common for people who are older than age 67.  Falls are a major cause of broken  bones and head injuries in people who are older than age 34. Take precautions to prevent a fall at home.  Work with your health care provider to learn what changes you can make to improve your health and wellness and to prevent falls. This information is not intended to replace advice given to you by your health care provider. Make sure you discuss any questions you have with your health care provider. Document Released: 01/26/2017 Document Revised: 07/06/2018 Document Reviewed: 01/26/2017 Elsevier Patient Education  2020 [employer].

## 2018-12-15 ENCOUNTER — Telehealth: Payer: Self-pay | Admitting: Pulmonary Disease

## 2018-12-15 NOTE — Telephone Encounter (Signed)
Call returned to patient, he wanted to be sure his appt for covid testing was at [facility] because his mychart says [place] [facility]. I made the patient aware he should report to the [place] location and disregard the [employer]. Voiced understanding. Nothing further needed at this time.

## 2018-12-18 ENCOUNTER — Other Ambulatory Visit (HOSPITAL_COMMUNITY)
Admission: RE | Admit: 2018-12-18 | Discharge: 2018-12-18 | Disposition: A | Discharge status: Home / Self Care | Payer: Medicare Other | Source: Ambulatory Visit | Attending: Pulmonary Disease | Admitting: Pulmonary Disease

## 2018-12-18 DIAGNOSIS — Z01812 Encounter for preprocedural laboratory examination: Secondary | ICD-10-CM | POA: Diagnosis not present

## 2018-12-18 DIAGNOSIS — Z20828 Contact with and (suspected) exposure to other viral communicable diseases: Secondary | ICD-10-CM | POA: Diagnosis not present

## 2018-12-20 LAB — NOVEL CORONAVIRUS, NAA (HOSP ORDER, SEND-OUT TO REF LAB; TAT 18-24 HRS): SARS-CoV-2, NAA: NOT DETECTED

## 2018-12-21 ENCOUNTER — Other Ambulatory Visit: Payer: Self-pay

## 2018-12-21 ENCOUNTER — Ambulatory Visit: Payer: Medicare Other

## 2018-12-21 ENCOUNTER — Ambulatory Visit: Payer: Medicare Other | Admitting: Pulmonary Disease

## 2018-12-25 ENCOUNTER — Other Ambulatory Visit: Payer: Self-pay

## 2018-12-25 ENCOUNTER — Ambulatory Visit (INDEPENDENT_AMBULATORY_CARE_PROVIDER_SITE_OTHER): Payer: Medicare Other | Admitting: Pulmonary Disease

## 2018-12-25 DIAGNOSIS — R05 Cough: Secondary | ICD-10-CM | POA: Diagnosis not present

## 2018-12-25 DIAGNOSIS — R053 Chronic cough: Secondary | ICD-10-CM

## 2018-12-25 LAB — PULMONARY FUNCTION TEST
DL/VA % pred: 120 %
DL/VA: 4.81 ml/min/mmHg/L
DLCO unc % pred: 103 %
DLCO unc: 28.21 ml/min/mmHg
FEF 25-75 Post: 3.89 L/sec
FEF 25-75 Pre: 2.62 L/sec
FEF2575-%Change-Post: 48 %
FEF2575-%Pred-Post: 151 %
FEF2575-%Pred-Pre: 102 %
FEV1-%Change-Post: 7 %
FEV1-%Pred-Post: 91 %
FEV1-%Pred-Pre: 84 %
FEV1-Post: 3.13 L
FEV1-Pre: 2.92 L
FEV1FVC-%Change-Post: 4 %
FEV1FVC-%Pred-Pre: 112 %
FEV6-%Change-Post: 2 %
FEV6-%Pred-Post: 82 %
FEV6-%Pred-Pre: 80 %
FEV6-Post: 3.65 L
FEV6-Pre: 3.55 L
FEV6FVC-%Pred-Post: 105 %
FEV6FVC-%Pred-Pre: 105 %
FVC-%Change-Post: 2 %
FVC-%Pred-Post: 77 %
FVC-%Pred-Pre: 75 %
FVC-Post: 3.65 L
FVC-Pre: 3.55 L
Post FEV1/FVC ratio: 86 %
Post FEV6/FVC ratio: 100 %
Pre FEV1/FVC ratio: 82 %
Pre FEV6/FVC Ratio: 100 %
RV % pred: 86 %
RV: 2.26 L
TLC % pred: 82 %
TLC: 6.14 L

## 2018-12-25 NOTE — Progress Notes (Signed)
PFT done today. 

## 2018-12-26 ENCOUNTER — Ambulatory Visit (INDEPENDENT_AMBULATORY_CARE_PROVIDER_SITE_OTHER): Payer: Medicare Other | Admitting: Pulmonary Disease

## 2018-12-26 ENCOUNTER — Encounter: Payer: Self-pay | Admitting: Pulmonary Disease

## 2018-12-26 VITALS — BP 134/72 | HR 68 | Temp 97.9°F | Ht 72.0 in | Wt 263.0 lb

## 2018-12-26 DIAGNOSIS — G4733 Obstructive sleep apnea (adult) (pediatric): Secondary | ICD-10-CM | POA: Diagnosis not present

## 2018-12-26 DIAGNOSIS — K219 Gastro-esophageal reflux disease without esophagitis: Secondary | ICD-10-CM | POA: Diagnosis not present

## 2018-12-26 DIAGNOSIS — R05 Cough: Secondary | ICD-10-CM

## 2018-12-26 DIAGNOSIS — Z23 Encounter for immunization: Secondary | ICD-10-CM | POA: Diagnosis not present

## 2018-12-26 DIAGNOSIS — R053 Chronic cough: Secondary | ICD-10-CM

## 2018-12-26 DIAGNOSIS — R058 Other specified cough: Secondary | ICD-10-CM

## 2018-12-26 NOTE — Progress Notes (Signed)
[facility]  Chief Complaint  Patient presents with  . Chronic Cough, OSA    Discuss PFT results.    Constitutional:  BP 134/72 (BP Location: Right Arm, Patient Position: Sitting, Cuff Size: Normal) Comment (BP Location): forearm - bulb on large cuff did not work.  Pulse 68   Temp 97.9 F (36.6 C)   Ht 6' (1.829 m)   Wt 263 lb (119.3 kg)   SpO2 95% Comment: on room air  BMI 35.67 kg/m   Past Medical History:  HTN, HLD, GERD, IBS, hemorrhoids, BPH, ED, DJD, HA, Vit D deficiency, Neuropathy, Anxiety  Brief Summary:  Ronald King is a 73 y.o. male former smoker with cough and obstructive sleep apnea.  RAST test showed reaction to grasses.  He continues to use afrin.  Not using nasal irrigation recently.  Steroid nose sprays ineffective.  Uses hydromet intermittently.  Had to use few doses of amoxicillin in August.    Not having wheeze, chest pain, fever, or hemoptysis.  Reflux symptoms stable.  Not having any issues with CPAP.    PFT showed positive bronchodilator response based on FEF 25-75, but otherwise normal.  Physical Exam:   Appearance - well kempt   ENMT - no sinus tenderness, no nasal discharge, no oral exudate, deviated nasal septum  Neck - no masses, trachea midline, no thyromegaly, no elevation in JVP  Respiratory - normal appearance of chest wall, normal respiratory effort w/o accessory muscle use, no dullness on percussion, no wheezing or rales  CV - s1s2 regular rate and rhythm, no murmurs, no peripheral edema, radial pulses symmetric  GI - soft, non tender  Lymph - no adenopathy noted in neck and axillary areas  MSK - normal gait  Ext - no cyanosis, clubbing, or joint inflammation noted  Skin - no rashes, lesions, or ulcers  Neuro - normal strength, oriented x 3  Psych - normal mood and affect   Assessment/Plan:   Chronic cough. - upper airway cough, GERD - he uses amoxicillin and  hydromet when he has flare ups of his cough; this has been a long standing regimen since he was seeing Dr. Lenna Gilford and he does not want to deviate from this regimen unless something changes  Upper airway cough with deviated nasal septum. - previously seen by Dr. Janace Hoard with ENT - nasal steroids sprays reported to be ineffective for him - he prefers to continue with daily afrin use - prn nasal irrigationn  Sleep apnea, NOS. - he is compliant with CPAP and reports benefit - continue CPAP 16 cm H2O  GERD. - followed by Dr. Silverio Decamp with GI  Vaccinations. - high dose flu shot today - will need to get booster pneumovax at next visit   Patient Instructions  High dose flu shot today  Follow up in 4 months     Chesley Mires, MD [facility] Pager: [phone] 12/26/2018, 11:11 AM  Flow Sheet     Pulmonary tests:  Spirometry September 2013 >> FEV 1 3.46 (86%), FEV1% 87 RAST 11/15/18 >> grasses, IgE 87 PFT 12/25/18 >> FEV1 3.13 (91%), FEV1% 86, TLC 6.14 (82%), DLCO 103%, +BD from FEF 25-75%  Sleep tests:  PSG 09/02/97 >> AHI 3 CPAP 11/27/18 to 12/22/18 >> used on 25 of 26 nights with average 8.5 hours per night.  Average AHI 12.1 with CPAP 16 cm H2O  Medications:   Allergies as of 12/26/2018      Reactions   Montelukast  Shortness Of Breath   Ceftin [cefuroxime Axetil]    Cymbalta [duloxetine Hcl]    Duloxetine    Other reaction(s): Other (See Comments) refuses   Lyrica [pregabalin] Other (See Comments)   Drowsiness   Neurontin [gabapentin] Other (See Comments)   Drowsiness       Medication List       Accurate as of December 26, 2018 11:11 AM. If you have any questions, ask your nurse or doctor.        amLODipine 10 MG tablet Commonly known as: NORVASC Take 1 tablet (10 mg total) by mouth daily.   amoxicillin 500 MG tablet Commonly known as: AMOXIL Take 1 tablet (500 mg total) by mouth 3 (three) times daily.   ascorbic acid 500 MG  tablet Commonly known as: VITAMIN C Take 1,000 mg by mouth daily.   aspirin 81 MG tablet Take 81 mg by mouth daily.   B-12 2000 MCG Tabs Take 1 tablet by mouth daily.   CENTRUM SILVER PO Take 1 tablet by mouth daily. Reported on 03/17/2015   clonazePAM 1 MG tablet Commonly known as: KLONOPIN Take 1 mg by mouth 2 (two) times daily.   CO Q 10 PO Take 1 tablet by mouth daily.   fenofibrate 145 MG tablet Commonly known as: TRICOR Take 1 tablet (145 mg total) by mouth daily.   HYDROcodone-homatropine 5-1.5 MG/5ML syrup Commonly known as: HYCODAN Take 5 mLs by mouth every 6 (six) hours as needed for cough.   lansoprazole 30 MG capsule Commonly known as: PREVACID TAKE 1 CAPSULE BY MOUTH TWICE DAILY   metFORMIN 500 MG 24 hr tablet Commonly known as: GLUCOPHAGE-XR TK 4 TS PO QD WITH THE EVE MEAL   PARoxetine 20 MG tablet Commonly known as: PAXIL Take 20 mg by mouth daily.   pyridOXINE 100 MG tablet Commonly known as: VITAMIN B-6 Take 100 mg by mouth daily.   RA Calcium Plus Vitamin D 600-400 MG-UNIT tablet Generic drug: Calcium Carbonate-Vitamin D Take 1 tablet by mouth daily.   rosuvastatin 20 MG tablet Commonly known as: CRESTOR Take 1 tablet (20 mg total) by mouth every other day.   sucralfate 1 g tablet Commonly known as: CARAFATE TAKE 1 TABLET(1 GRAM) BY MOUTH TWICE DAILY What changed: See the new instructions.       Past Surgical History:  He  has a past surgical history that includes Laparoscopic Nissen fundoplication (A999333); Knee arthroscopy (2007); Tonsillectomy; Nose surgery; Anal fissure repair; hand fracture surgery; and Upper gastrointestinal endoscopy.  Family History:  His family history includes Alcohol abuse in his father; CAD in his brother; Cerebral aneurysm in his father; Diabetes in his maternal uncle; Heart disease in his mother; Migraines in his brother; Neuropathy in his mother.  Social History:  He  reports that he quit smoking  about 53 years ago. He has a 3.00 pack-year smoking history. He has never used smokeless tobacco. He reports that he does not drink alcohol or use drugs.

## 2018-12-26 NOTE — Patient Instructions (Signed)
High dose flu shot today  Follow up in 4 months 

## 2019-01-01 ENCOUNTER — Other Ambulatory Visit: Payer: Self-pay

## 2019-01-02 ENCOUNTER — Encounter: Payer: Self-pay | Admitting: Internal Medicine

## 2019-01-02 ENCOUNTER — Other Ambulatory Visit: Payer: Self-pay

## 2019-01-02 ENCOUNTER — Ambulatory Visit (INDEPENDENT_AMBULATORY_CARE_PROVIDER_SITE_OTHER): Payer: Medicare Other | Admitting: Internal Medicine

## 2019-01-02 VITALS — BP 131/74 | HR 66 | Temp 96.8°F | Resp 16 | Ht 72.0 in | Wt 259.1 lb

## 2019-01-02 DIAGNOSIS — E559 Vitamin D deficiency, unspecified: Secondary | ICD-10-CM

## 2019-01-02 DIAGNOSIS — I10 Essential (primary) hypertension: Secondary | ICD-10-CM | POA: Diagnosis not present

## 2019-01-02 DIAGNOSIS — G629 Polyneuropathy, unspecified: Secondary | ICD-10-CM | POA: Diagnosis not present

## 2019-01-02 DIAGNOSIS — Z0000 Encounter for general adult medical examination without abnormal findings: Secondary | ICD-10-CM

## 2019-01-02 LAB — COMPREHENSIVE METABOLIC PANEL
ALT: 15 U/L (ref 0–53)
AST: 17 U/L (ref 0–37)
Albumin: 4.3 g/dL (ref 3.5–5.2)
Alkaline Phosphatase: 49 U/L (ref 39–117)
BUN: 16 mg/dL (ref 6–23)
CO2: 30 mEq/L (ref 19–32)
Calcium: 9.4 mg/dL (ref 8.4–10.5)
Chloride: 106 mEq/L (ref 96–112)
Creatinine, Ser: 1.07 mg/dL (ref 0.40–1.50)
GFR: 67.88 mL/min (ref >60.00)
Glucose, Bld: 112 mg/dL — ABNORMAL HIGH (ref 70–99)
Potassium: 4.4 mEq/L (ref 3.5–5.1)
Sodium: 143 mEq/L (ref 135–145)
Total Bilirubin: 0.4 mg/dL (ref 0.2–1.2)
Total Protein: 6.3 g/dL (ref 6.0–8.3)

## 2019-01-02 LAB — VITAMIN B12: Vitamin B-12: 256 pg/mL (ref 211–911)

## 2019-01-02 LAB — VITAMIN D 25 HYDROXY (VIT D DEFICIENCY, FRACTURES): VITD: 33.99 ng/mL (ref 30.00–100.00)

## 2019-01-02 NOTE — Assessment & Plan Note (Signed)
--  Td 2017 - Pneumonia shot 2013 - prevnar 2015 - zostavax-- 11-2012 - s/p  shingrex - had a flu shot  --CCS: Cscope 08-2008, neg, Cscope 10/2018, next per GI --h/o elevated PSA, f/u by urology  --rec to stay active and eat healthy --Labs: CMP, B12 and vitamin D

## 2019-01-02 NOTE — Patient Instructions (Addendum)
Per our records you are due for an eye exam. Please contact your eye doctor to schedule an appointment. Please have them send copies of your office visit notes to [place]. Our fax number is [phone].  GO TO THE LAB : Get the blood work     GO TO THE FRONT DESK Schedule your next appointment for a physical exam in 1 year

## 2019-01-02 NOTE — Progress Notes (Signed)
Subjective:    Patient ID: Ronald King, male    DOB: 1945/07/20, 73 y.o.   MRN: [id number]  DOS:  01/02/2019 Type of visit - description: CPX No major concerns.   Review of Systems Chronic cough at baseline Denies LUTS Request to check B12 and vitamin D level  Other than above, a 14 point review of systems is negative    Past Medical History:  Diagnosis Date  . Allergy   . Anemia   . Anxiety   . Blood transfusion without reported diagnosis   . Chronic bronchitis   . Chronic cough   . Diabetes ([place]) 07-2011   per Dr Buddy Duty  . DJD (degenerative joint disease)   . Dyslipidemia   . Esophageal polyp   . GERD (gastroesophageal reflux disease)    s/p Nissan F.  . Hemorrhoids, internal   . History of gastrointestinal hemorrhage   . Hypertension   . Hypogonadism, male    per Dr Buddy Duty  . Neuromuscular disorder (HCC)    neuropathy feet  . Neuropathy    per neuro-Dr Ferrarou vicodin  . OSA (obstructive sleep apnea)    on CPAP  . Sleep apnea    CPAP at night  . Vitamin D deficiency     Past Surgical History:  Procedure Laterality Date  . ANAL FISSURE REPAIR    . hand fracture surgery     left  . KNEE ARTHROSCOPY  2007   Dr. Marlou Sa  . LAPAROSCOPIC NISSEN FUNDOPLICATION  [id number]   Dr. Hassell Done  . NOSE SURGERY    . TONSILLECTOMY    . UPPER GASTROINTESTINAL ENDOSCOPY      Social History   Socioeconomic History  . Marital status: Married    Spouse name: Not on file  . Number of children: 1  . Years of education: Not on file  . Highest education level: Not on file  Occupational History  . Occupation: Technical brewer, retired    Comment: retired  . Occupation: minister    Comment: [employer], [place]  . Financial resource strain: Not on file  . Food insecurity    Worry: Not on file    Inability: Not on file  . Transportation needs    Medical: Not on file    Non-medical: Not on file  Tobacco Use  . Smoking status: Former  Smoker    Packs/day: 1.00    Years: 3.00    Pack years: 3.00    Quit date: 03/29/1965    Years since quitting: 53.8  . Smokeless tobacco: Never Used  Substance and Sexual Activity  . Alcohol use: No  . Drug use: No  . Sexual activity: Not on file  Lifestyle  . Physical activity    Days per week: Not on file    Minutes per session: Not on file  . Stress: Not on file  Relationships  . Social Herbalist on phone: Not on file    Gets together: Not on file    Attends religious service: Not on file    Active member of club or organization: Not on file    Attends meetings of clubs or organizations: Not on file    Relationship status: Not on file  . Intimate partner violence    Fear of current or ex partner: Not on file    Emotionally abused: Not on file    Physically abused: Not on file    Forced sexual activity:  Not on file  Other Topics Concern  . Not on file  Social History Narrative   Lives w/ wife     Family History  Problem Relation Age of Onset  . Cerebral aneurysm Father   . Alcohol abuse Father   . Neuropathy Mother   . Heart disease Mother        died at age 58  . Migraines Brother        several  . Diabetes Maternal Uncle   . CAD Brother        CABG in his 81s  . Colon cancer Neg Hx   . Prostate cancer Neg Hx   . Esophageal cancer Neg Hx   . Stomach cancer Neg Hx   . Rectal cancer Neg Hx      Allergies as of 01/02/2019      Reactions   Montelukast Shortness Of Breath   Ceftin [cefuroxime Axetil]    Cymbalta [duloxetine Hcl]    Duloxetine    Other reaction(s): Other (See Comments) refuses   Lyrica [pregabalin] Other (See Comments)   Drowsiness   Neurontin [gabapentin] Other (See Comments)   Drowsiness       Medication List       Accurate as of January 02, 2019 11:59 PM. If you have any questions, ask your nurse or doctor.        amLODipine 10 MG tablet Commonly known as: NORVASC Take 1 tablet (10 mg total) by mouth daily.    amoxicillin 500 MG tablet Commonly known as: AMOXIL Take 1 tablet (500 mg total) by mouth 3 (three) times daily.   ascorbic acid 500 MG tablet Commonly known as: VITAMIN C Take 1,000 mg by mouth daily.   aspirin 81 MG tablet Take 81 mg by mouth daily.   B-12 2000 MCG Tabs Take 1 tablet by mouth daily.   CENTRUM SILVER PO Take 1 tablet by mouth daily. Reported on 03/17/2015   clonazePAM 1 MG tablet Commonly known as: KLONOPIN Take 1 mg by mouth 2 (two) times daily.   CO Q 10 PO Take 1 tablet by mouth daily.   fenofibrate 145 MG tablet Commonly known as: TRICOR Take 1 tablet (145 mg total) by mouth daily.   HYDROcodone-homatropine 5-1.5 MG/5ML syrup Commonly known as: HYCODAN Take 5 mLs by mouth every 6 (six) hours as needed for cough.   lansoprazole 30 MG capsule Commonly known as: PREVACID TAKE 1 CAPSULE BY MOUTH TWICE DAILY   metFORMIN 500 MG 24 hr tablet Commonly known as: GLUCOPHAGE-XR TK 4 TS PO QD WITH THE EVE MEAL   PARoxetine 20 MG tablet Commonly known as: PAXIL Take 20 mg by mouth daily.   pyridOXINE 100 MG tablet Commonly known as: VITAMIN B-6 Take 100 mg by mouth daily.   RA Calcium Plus Vitamin D 600-400 MG-UNIT tablet Generic drug: Calcium Carbonate-Vitamin D Take 1 tablet by mouth daily.   rosuvastatin 20 MG tablet Commonly known as: CRESTOR Take 1 tablet (20 mg total) by mouth every other day.   sucralfate 1 g tablet Commonly known as: CARAFATE TAKE 1 TABLET(1 GRAM) BY MOUTH TWICE DAILY What changed: See the new instructions.           Objective:   Physical Exam BP 131/74 (BP Location: Left Arm, Patient Position: Sitting, Cuff Size: Normal)   Pulse 66   Temp (!) 96.8 F (36 C) (Temporal)   Resp 16   Ht 6' (1.829 m)   Wt 259 lb 2 oz (  117.5 kg)   SpO2 96%   BMI 35.14 kg/m  General: Well developed, NAD, BMI noted Neck: No  thyromegaly  HEENT:  Normocephalic . Face symmetric, atraumatic.  Mild amount of wax in both ears, I  was able to remove some with a spoon from the right Lungs:  CTA B Normal respiratory effort, no intercostal retractions, no accessory muscle use. Heart: RRR,  no murmur.  No pretibial edema bilaterally  Abdomen:  Not distended, soft, non-tender. No rebound or rigidity.   Skin: Exposed areas without rash. Not pale. Not jaundice Neurologic:  alert & oriented X3.  Speech normal, gait appropriate for age and unassisted Strength symmetric and appropriate for age.  Psych: Cognition and judgment appear intact.  Cooperative with normal attention span and concentration.  Behavior appropriate. No anxious or depressed appearing.     Assessment     Assessment  ENDO: Dr Buddy Duty -DM - hypogonadism  HTN Dislipidemia NEURO: --+ Neuropathy  --RLS type of symptoms Morbid Obesity, BMI 35 Anxiety -- paxil- clonazepam (per Dr Tye Savoy ) Vitamin D deficiency PULM:  --OSA, CPAP --Chronic cough  DJD, Dr. Marlou Sa GU: Dr Erenest Rasher BPH, ED   GI: --GERD, status post Nissen fundoplication --Esophageal polyp -- h/o GI bleed- tranfussion (90s)  PLAN: DM: Last A1c 6.3.  Follow-up by Endo Low bone mass: Reportedly had a bone density test at Endo, will try to get records Hypogonadism: Currently on no HRT Dyslipidemia: On Crestor, last FLP very good Chronic cough: Now under the care of Dr. Halford Chessman Vitamin D deficiency: Checking labs RTC 1 year

## 2019-01-02 NOTE — Progress Notes (Signed)
Pre visit review using our clinic review tool, if applicable. No additional management support is needed unless otherwise documented below in the visit note. 

## 2019-01-03 NOTE — Assessment & Plan Note (Signed)
DM: Last A1c 6.3.  Follow-up by Endo Low bone mass: Reportedly had a bone density test at Endo, will try to get records Hypogonadism: Currently on no HRT Dyslipidemia: On Crestor, last FLP very good Chronic cough: Now under the care of Dr. Halford Chessman Vitamin D deficiency: Checking labs RTC 1 year

## 2019-01-25 ENCOUNTER — Other Ambulatory Visit: Payer: Self-pay | Admitting: Internal Medicine

## 2019-02-08 LAB — HM DIABETES EYE EXAM

## 2019-02-10 ENCOUNTER — Other Ambulatory Visit: Payer: Self-pay | Admitting: Internal Medicine

## 2019-02-15 ENCOUNTER — Encounter: Payer: Self-pay | Admitting: Internal Medicine

## 2019-02-26 ENCOUNTER — Telehealth: Payer: Self-pay | Admitting: Pulmonary Disease

## 2019-02-26 NOTE — Telephone Encounter (Signed)
Spoke with pt, he has been coughing a lot and would like a refill of the Hycodan. He is not coughing anything up and feels he just needs something to suppress the cough. Dr. Halford Chessman please advise. Last Rx written for 11/15/2018.    Assessment/Plan:   Chronic cough. - upper airway cough, GERD - he uses amoxicillin and hydromet when he has flare ups of his cough; this has been a long standing regimen since he was seeing Dr. Lenna Gilford and he does not want to deviate from this regimen unless something changes

## 2019-02-27 MED ORDER — HYDROCODONE-HOMATROPINE 5-1.5 MG/5ML PO SYRP: 5.0000 mL | ORAL_SOLUTION | Freq: Four times a day (QID) | ORAL | 0 refills | Status: DC | PRN

## 2019-02-27 NOTE — Telephone Encounter (Signed)
Please let him know I sent script for hycodan to [facility] on [address].

## 2019-02-27 NOTE — Telephone Encounter (Signed)
I called and spoke with the patient and made him aware. Nothing further is needed.

## 2019-03-09 ENCOUNTER — Other Ambulatory Visit: Payer: Self-pay | Admitting: Internal Medicine

## 2019-04-20 ENCOUNTER — Telehealth: Payer: Self-pay | Admitting: Gastroenterology

## 2019-04-26 DIAGNOSIS — E119 Type 2 diabetes mellitus without complications: Secondary | ICD-10-CM | POA: Diagnosis not present

## 2019-04-26 DIAGNOSIS — M8580 Other specified disorders of bone density and structure, unspecified site: Secondary | ICD-10-CM | POA: Diagnosis not present

## 2019-04-26 DIAGNOSIS — E230 Hypopituitarism: Secondary | ICD-10-CM | POA: Diagnosis not present

## 2019-04-26 DIAGNOSIS — Z5181 Encounter for therapeutic drug level monitoring: Secondary | ICD-10-CM | POA: Diagnosis not present

## 2019-04-26 DIAGNOSIS — K5900 Constipation, unspecified: Secondary | ICD-10-CM | POA: Diagnosis not present

## 2019-04-26 DIAGNOSIS — Z7189 Other specified counseling: Secondary | ICD-10-CM | POA: Diagnosis not present

## 2019-04-26 DIAGNOSIS — N400 Enlarged prostate without lower urinary tract symptoms: Secondary | ICD-10-CM | POA: Diagnosis not present

## 2019-04-26 LAB — HM DIABETES FOOT EXAM: HM Diabetic Foot Exam: NORMAL

## 2019-04-26 LAB — MICROALBUMIN, URINE: Microalb, Ur: 2.64

## 2019-04-26 LAB — HEMOGLOBIN A1C: Hemoglobin A1C: 6.3

## 2019-04-26 LAB — PSA: PSA: 3.22

## 2019-05-03 ENCOUNTER — Telehealth: Payer: Self-pay

## 2019-05-03 NOTE — Telephone Encounter (Signed)
I have started a prior authorization for patient's lansoprazole 30mg  capsules, take one capsule by mouth twice daily. I spoke with a customer rep from [facility] . Dx-GERD K21.9. Questions answered and we will await the outcome within 72 hours they will notify us by fax.

## 2019-05-04 NOTE — Telephone Encounter (Signed)
We received a fax from [facility] with approval for patient's lansoprazole 30mg  capsules. The authorization is good until 03/28/2020. Pharmacy informed.

## 2019-05-09 ENCOUNTER — Encounter: Payer: Self-pay | Admitting: Internal Medicine

## 2019-05-11 ENCOUNTER — Encounter: Payer: Self-pay | Admitting: Internal Medicine

## 2019-07-05 ENCOUNTER — Other Ambulatory Visit: Payer: Self-pay

## 2019-07-05 MED ORDER — LANSOPRAZOLE 30 MG PO CPDR: 30.0000 mg | DELAYED_RELEASE_CAPSULE | Freq: Two times a day (BID) | ORAL | 0 refills | Status: DC

## 2019-07-24 DIAGNOSIS — G4733 Obstructive sleep apnea (adult) (pediatric): Secondary | ICD-10-CM | POA: Diagnosis not present

## 2019-07-26 DIAGNOSIS — E119 Type 2 diabetes mellitus without complications: Secondary | ICD-10-CM | POA: Diagnosis not present

## 2019-07-26 DIAGNOSIS — R5383 Other fatigue: Secondary | ICD-10-CM | POA: Diagnosis not present

## 2019-07-26 DIAGNOSIS — E230 Hypopituitarism: Secondary | ICD-10-CM | POA: Diagnosis not present

## 2019-07-26 LAB — HEMOGLOBIN A1C: Hemoglobin A1C: 6.5

## 2019-07-26 LAB — TSH: TSH: 2.95 (ref <=5.90)

## 2019-07-26 LAB — HM DIABETES FOOT EXAM: HM Diabetic Foot Exam: NORMAL

## 2019-08-06 ENCOUNTER — Encounter: Payer: Self-pay | Admitting: Internal Medicine

## 2019-08-28 ENCOUNTER — Telehealth: Payer: Self-pay | Admitting: Pulmonary Disease

## 2019-08-28 NOTE — Telephone Encounter (Signed)
Spoke with the pt  He is asking for refill on hycodan  Overdue for f/u  Appt scheduled with VS for tomorrow 08/29/19 at 10:30 am  Pt denies fever, chills, body aches, increased SOB, cough  Has had both covid vaccines

## 2019-08-29 ENCOUNTER — Other Ambulatory Visit: Payer: Self-pay

## 2019-08-29 ENCOUNTER — Ambulatory Visit: Payer: Medicare Other | Admitting: Pulmonary Disease

## 2019-08-29 ENCOUNTER — Ambulatory Visit (INDEPENDENT_AMBULATORY_CARE_PROVIDER_SITE_OTHER): Payer: Medicare PPO | Admitting: Pulmonary Disease

## 2019-08-29 ENCOUNTER — Encounter: Payer: Self-pay | Admitting: Pulmonary Disease

## 2019-08-29 VITALS — BP 140/86 | HR 88 | Temp 98.3°F | Ht 73.0 in | Wt 267.2 lb

## 2019-08-29 DIAGNOSIS — R05 Cough: Secondary | ICD-10-CM | POA: Diagnosis not present

## 2019-08-29 DIAGNOSIS — R053 Chronic cough: Secondary | ICD-10-CM

## 2019-08-29 DIAGNOSIS — G4733 Obstructive sleep apnea (adult) (pediatric): Secondary | ICD-10-CM | POA: Diagnosis not present

## 2019-08-29 DIAGNOSIS — R058 Other specified cough: Secondary | ICD-10-CM

## 2019-08-29 MED ORDER — HYDROCODONE-HOMATROPINE 5-1.5 MG/5ML PO SYRP: 5.0000 mL | ORAL_SOLUTION | Freq: Four times a day (QID) | ORAL | 0 refills | Status: DC | PRN

## 2019-08-29 NOTE — Patient Instructions (Signed)
Follow up in 6 months 

## 2019-08-29 NOTE — Progress Notes (Signed)
[place] [facility]  Chief Complaint  Patient presents with   Follow-up    pt states having chronic bronchitis.pt needs a refill on cough medication    Constitutional:  BP 140/86 (BP Location: Left Arm, Cuff Size: Normal)    Pulse 88    Temp 98.3 F (36.8 C) (Oral)    Ht 6\' 1"  (1.854 m)    Wt 267 lb 3.2 oz (121.2 kg)    SpO2 96%    BMI 35.25 kg/m   Past Medical History:  HTN, HLD, GERD, IBS, hemorrhoids, BPH, ED, DJD, HA, Vit D deficiency, Neuropathy, Anxiety  Brief Summary:  Ronald King is a 74 y.o. male former smoker with cough and obstructive sleep apnea.  Subjective:   He had 8 "mini" episodes of bronchitis over the winter.  He would take amoxicillin and hycodan and this would knock it out.  He has been breathing okay recently.  Not currently having cough, chest congestion, chest burning, or wheeze.  He says that his PCP recently tried him on flovent and that was ineffective.  I wasn't able to see where this was prescribed in Epic.  Physical Exam:   Appearance - well kempt   ENMT - no sinus tenderness, no oral exudate, no LAN, Mallampati 3 airway, no stridor  Respiratory - equal breath sounds bilaterally, no wheezing or rales  CV - s1s2 regular rate and rhythm, no murmurs  Ext - no clubbing, no edema  Skin - no rashes  Psych - normal mood and affect  Assessment/Plan:   Chronic cough. 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 - previously seen by Dr. Janace Hoard with ENT - nasal steroids sprays reported to be ineffective for him - he continues to use afrin and nasal irrigation  Sleep apnea, NOS. - he is compliant with CPAP and reports benefit - continue CPAP 16 cm H2O  GERD. - followed by Dr. Silverio Decamp with GI  A total of  24  minutes spent addressing patient care issues on day of visit.  Follow up:   Patient Instructions  Follow up in 6 months   Signature:  Chesley Mires, MD [place] Pager: [phone] 08/29/2019, 12:41 PM  Flow Sheet     Pulmonary tests:  Spirometry September 2013 >> FEV 1 3.46 (86%), FEV1% 87 RAST 11/15/18 >> grasses, IgE 87 PFT 12/25/18 >> FEV1 3.13 (91%), FEV1% 86, TLC 6.14 (82%), DLCO 103%, +BD from FEF 25-75%  Sleep tests:  PSG 09/02/97 >> AHI 3 CPAP 11/27/18 to 12/22/18 >> used on 25 of 26 nights with average 8.5 hours per night.  Average AHI 12.1 with CPAP 16 cm H2O  Medications:   Allergies as of 08/29/2019      Reactions   Montelukast Shortness Of Breath   Ceftin [cefuroxime Axetil]    Cymbalta [duloxetine Hcl]    Duloxetine    Other reaction(s): Other (See Comments) refuses   Lyrica [pregabalin] Other (See Comments)   Drowsiness   Neurontin [gabapentin] Other (See Comments)   Drowsiness       Medication List       Accurate as of August 29, 2019 12:41 PM. If you have any questions, ask your nurse or doctor.        amLODipine 10 MG tablet  Commonly known as: NORVASC Take 1 tablet (10 mg total) by mouth daily.   amoxicillin 500 MG tablet Commonly known as: AMOXIL Take 1 tablet (500 mg total) by mouth 3 (three) times daily.   ascorbic acid 500 MG tablet Commonly known as: VITAMIN C Take 1,000 mg by mouth daily.   aspirin 81 MG tablet Take 81 mg by mouth daily.   B-12 2000 MCG Tabs Take 1 tablet by mouth daily.   CENTRUM SILVER PO Take 1 tablet by mouth daily. Reported on 03/17/2015   clonazePAM 1 MG tablet Commonly known as: KLONOPIN Take 1 mg by mouth 2 (two) times daily.   CO Q 10 PO Take 1 tablet by mouth daily.   fenofibrate 145 MG tablet Commonly known as: TRICOR Take 1 tablet (145 mg total) by mouth daily.   HYDROcodone-homatropine 5-1.5 MG/5ML syrup Commonly known as: HYCODAN Take 5 mLs by mouth every 6 (six) hours  as needed for cough.   lansoprazole 30 MG capsule Commonly known as: PREVACID Take 1 capsule (30 mg total) by mouth 2 (two) times daily.   metFORMIN 500 MG 24 hr tablet Commonly known as: GLUCOPHAGE-XR TK 4 TS PO QD WITH THE EVE MEAL   PARoxetine 20 MG tablet Commonly known as: PAXIL Take 20 mg by mouth daily.   pyridOXINE 100 MG tablet Commonly known as: VITAMIN B-6 Take 100 mg by mouth daily.   RA Calcium Plus Vitamin D 600-400 MG-UNIT tablet Generic drug: Calcium Carbonate-Vitamin D Take 1 tablet by mouth daily.   rosuvastatin 20 MG tablet Commonly known as: CRESTOR Take 1 tablet (20 mg total) by mouth every other day.   sucralfate 1 g tablet Commonly known as: CARAFATE TAKE 1 TABLET(1 GRAM) BY MOUTH TWICE DAILY What changed: See the new instructions.       Past Surgical History:  He  has a past surgical history that includes Laparoscopic Nissen fundoplication (A999333); Knee arthroscopy (2007); Tonsillectomy; Nose surgery; Anal fissure repair; hand fracture surgery; and Upper gastrointestinal endoscopy.  Family History:  His family history includes Alcohol abuse in his father; CAD in his brother; Cerebral aneurysm in his father; Diabetes in his maternal uncle; Heart disease in his mother; Migraines in his brother; Neuropathy in his mother.  Social History:  He  reports that he quit smoking about 54 years ago. He has a 3.00 pack-year smoking history. He has never used smokeless tobacco. He reports that he does not drink alcohol or use drugs.

## 2019-10-04 ENCOUNTER — Other Ambulatory Visit: Payer: Self-pay | Admitting: Gastroenterology

## 2019-12-24 ENCOUNTER — Telehealth: Payer: Self-pay | Admitting: Pulmonary Disease

## 2019-12-24 DIAGNOSIS — R053 Chronic cough: Secondary | ICD-10-CM

## 2019-12-24 NOTE — Telephone Encounter (Signed)
Pt calling back again.

## 2019-12-24 NOTE — Telephone Encounter (Signed)
Dr. Halford Chessman we are unable to send in refills for these medications. Please sign pended orders below. Thank you

## 2019-12-24 NOTE — Telephone Encounter (Signed)
Okay to send refills for these.

## 2019-12-24 NOTE — Telephone Encounter (Signed)
Spoke with patient, has a chronic cough, coughs all the time.  Not coughing anything up, denies any fever, chills or body aches.  Has had covid vaccines.  Does not tolerate inhalers.  He is requesting amoxicillin and Hydrocodone-homatropine cough syrup.  Dr. Halford Chessman, please advise.

## 2019-12-25 MED ORDER — HYDROCODONE-HOMATROPINE 5-1.5 MG/5ML PO SYRP: 5.0000 mL | ORAL_SOLUTION | Freq: Four times a day (QID) | ORAL | 0 refills | Status: DC | PRN

## 2019-12-25 MED ORDER — AMOXICILLIN 500 MG PO TABS: 500.0000 mg | ORAL_TABLET | Freq: Three times a day (TID) | ORAL | 0 refills | Status: DC

## 2019-12-25 NOTE — Telephone Encounter (Signed)
Called patient to let him know refills were sent in. Nothing further needed at this time.

## 2019-12-25 NOTE — Telephone Encounter (Signed)
Orders signed.

## 2020-01-03 ENCOUNTER — Ambulatory Visit (INDEPENDENT_AMBULATORY_CARE_PROVIDER_SITE_OTHER): Payer: Medicare PPO | Admitting: Internal Medicine

## 2020-01-03 ENCOUNTER — Encounter: Payer: Self-pay | Admitting: Internal Medicine

## 2020-01-03 ENCOUNTER — Other Ambulatory Visit: Payer: Self-pay

## 2020-01-03 VITALS — BP 145/88 | HR 65 | Temp 98.2°F | Resp 18 | Ht 73.0 in | Wt 265.5 lb

## 2020-01-03 DIAGNOSIS — Z0000 Encounter for general adult medical examination without abnormal findings: Secondary | ICD-10-CM | POA: Diagnosis not present

## 2020-01-03 DIAGNOSIS — E785 Hyperlipidemia, unspecified: Secondary | ICD-10-CM

## 2020-01-03 DIAGNOSIS — I10 Essential (primary) hypertension: Secondary | ICD-10-CM

## 2020-01-03 NOTE — Progress Notes (Signed)
Subjective:    Patient ID: Ronald King, male    DOB: 1946-02-18, 74 y.o.   MRN: [id number]  DOS:  01/03/2020 Type of visit - description: CPX In general he feels well, at baseline, no new concerns.  BP Readings from Last 3 Encounters:  01/03/20 (!) 145/88  08/29/19 140/86  01/02/19 131/74     Review of Systems He denies chest pain or difficulty breathing. Neuropathy symptoms at baseline, has distal lower extremity numbness on and off. Has episodic cough, at baseline.  Other than above, a 14 point review of systems is negative    Past Medical History:  Diagnosis Date   Allergy    Anemia    Anxiety    Blood transfusion without reported diagnosis    Chronic bronchitis    Chronic cough    Diabetes ([place]) 07-2011   per Dr Buddy Duty   DJD (degenerative joint disease)    Dyslipidemia    Esophageal polyp    GERD (gastroesophageal reflux disease)    s/p [employer] F.   Hemorrhoids, internal    History of gastrointestinal hemorrhage    Hypertension    Hypogonadism, male    per Dr Buddy Duty   Neuromuscular disorder [facility])    neuropathy feet   Neuropathy    per neuro-Dr Joselyn Glassman vicodin   OSA (obstructive sleep apnea)    on CPAP   Sleep apnea    CPAP at night   Vitamin D deficiency     Past Surgical History:  Procedure Laterality Date   ANAL FISSURE REPAIR     hand fracture surgery     left   KNEE ARTHROSCOPY  2007   Dr. Marlou Sa   LAPAROSCOPIC NISSEN FUNDOPLICATION  08/2128   Dr. Hassell Done   NOSE SURGERY     TONSILLECTOMY     UPPER GASTROINTESTINAL ENDOSCOPY      Allergies as of 01/03/2020      Reactions   Montelukast Shortness Of Breath   Ceftin [cefuroxime Axetil]    Cymbalta [duloxetine Hcl]    Duloxetine    Other reaction(s): Other (See Comments) refuses   Lyrica [pregabalin] Other (See Comments)   Drowsiness   Neurontin [gabapentin] Other (See Comments)   Drowsiness       Medication List       Accurate as of January 03, 2020  11:59 PM. If you have any questions, ask your nurse or doctor.        STOP taking these medications   metFORMIN 500 MG 24 hr tablet Commonly known as: GLUCOPHAGE-XR Stopped by: Kathlene November, MD     TAKE these medications   amLODipine 10 MG tablet Commonly known as: NORVASC Take 1 tablet (10 mg total) by mouth daily.   amoxicillin 500 MG tablet Commonly known as: AMOXIL Take 1 tablet (500 mg total) by mouth 3 (three) times daily.   ascorbic acid 500 MG tablet Commonly known as: VITAMIN C Take 1,000 mg by mouth daily.   aspirin 81 MG tablet Take 81 mg by mouth daily.   B-12 2000 MCG Tabs Take 1 tablet by mouth daily.   CENTRUM SILVER PO Take 1 tablet by mouth daily. Reported on 03/17/2015   clonazePAM 1 MG tablet Commonly known as: KLONOPIN Take 1 mg by mouth 2 (two) times daily.   CO Q 10 PO Take 1 tablet by mouth daily.   fenofibrate 145 MG tablet Commonly known as: TRICOR Take 1 tablet (145 mg total) by mouth daily.   HYDROcodone-homatropine 5-1.5  MG/5ML syrup Commonly known as: HYCODAN Take 5 mLs by mouth every 6 (six) hours as needed for cough.   lansoprazole 30 MG capsule Commonly known as: PREVACID TAKE 1 CAPSULE(30 MG) BY MOUTH TWICE DAILY   PARoxetine 20 MG tablet Commonly known as: PAXIL Take 20 mg by mouth daily.   pyridOXINE 100 MG tablet Commonly known as: VITAMIN B-6 Take 100 mg by mouth daily.   RA Calcium Plus Vitamin D 600-400 MG-UNIT tablet Generic drug: Calcium Carbonate-Vitamin D Take 1 tablet by mouth daily.   rosuvastatin 20 MG tablet Commonly known as: CRESTOR Take 1 tablet (20 mg total) by mouth every other day.   sucralfate 1 g tablet Commonly known as: CARAFATE TAKE 1 TABLET(1 GRAM) BY MOUTH TWICE DAILY What changed: See the new instructions.          Objective:   Physical Exam BP (!) 145/88 (BP Location: Right Arm, Patient Position: Sitting, Cuff Size: Normal)    Pulse 65    Temp 98.2 F (36.8 C) (Oral)    Resp 18     Ht 6\' 1"  (1.854 m)    Wt 265 lb 8 oz (120.4 kg)    SpO2 97%    BMI 35.03 kg/m  General: Well developed, NAD, BMI noted Neck: No  thyromegaly  HEENT:  Normocephalic . Face symmetric, atraumatic Lungs:  CTA B Normal respiratory effort, no intercostal retractions, no accessory muscle use. Heart: RRR,  no murmur.  Abdomen:  Not distended, soft, non-tender. No rebound or rigidity.   Lower extremities: no pretibial edema bilaterally  Skin: Exposed areas without rash. Not pale. Not jaundice Neurologic:  alert & oriented X3.  Speech normal, gait appropriate for age and unassisted Strength symmetric and appropriate for age.  Psych: Cognition and judgment appear intact.  Cooperative with normal attention span and concentration.  Behavior appropriate. No anxious or depressed appearing.     Assessment    Assessment  ENDO: Dr Buddy Duty -DM - hypogonadism  HTN Dislipidemia NEURO: see note 08/08/2017 --+ Neuropathy  --RLS type of symptoms Morbid Obesity, BMI 35 Anxiety -- paxil- clonazepam (per Dr Tye Savoy ) Vitamin D deficiency PULM:  --OSA, CPAP --Chronic cough  DJD, Dr. Marlou Sa GU: Dr Erenest Rasher BPH, ED   GI: --GERD, status post Nissen fundoplication --Esophageal polyp -- h/o GI bleed- tranfussion (90s)  PLAN: Here for CPX DM: Per Endo, reports Metformin was discontinued. Neuropathy: Feet care recommended. HTN: On amlodipine, BP today slightly elevated, at home consistently in the 130s.  No change Dyslipidemia: On Crestor, TriCor, checking labs Chronic cough: Saw pulmonary 08/29/2019 for chronic cough, sleep apnea, no changes made Other problems seem stable. RTC 6 to 8 months   This visit occurred during the SARS-CoV-2 public health emergency.  Safety protocols were in place, including screening questions prior to the visit, additional usage of staff PPE, and extensive cleaning of exam room while observing appropriate contact time as indicated for disinfecting solutions.

## 2020-01-03 NOTE — Patient Instructions (Addendum)
Check the  blood pressure once a week.  BP GOAL is between 110/65 and  135/85. If it is consistently higher or lower, let me know  GO TO THE LAB : Get the blood work     [place], [place] back for a checkup in 6 to 8 months   Diabetes Mellitus and [place] care is an important part of your health, especially when you have diabetes. Diabetes may cause you to have problems because of poor blood flow (circulation) to your feet and legs, which can cause your skin to:  Become thinner and drier.  Break more easily.  Heal more slowly.  Peel and crack. You may also have nerve damage (neuropathy) in your legs and feet, causing decreased feeling in them. This means that you may not notice minor injuries to your feet that could lead to more serious problems. Noticing and addressing any potential problems early is the best way to prevent future foot problems. How to care for your feet Foot hygiene  Wash your feet daily with warm water and mild soap. Do not use hot water. Then, pat your feet and the areas between your toes until they are completely dry. Do not soak your feet as this can dry your skin.  Trim your toenails straight across. Do not dig under them or around the cuticle. File the edges of your nails with an emery board or nail file.  Apply a moisturizing lotion or petroleum jelly to the skin on your feet and to dry, brittle toenails. Use lotion that does not contain alcohol and is unscented. Do not apply lotion between your toes. Shoes and socks  Wear clean socks or stockings every day. Make sure they are not too tight. Do not wear knee-high stockings since they may decrease blood flow to your legs.  Wear shoes that fit properly and have enough cushioning. Always look in your shoes before you put them on to be sure there are no objects inside.  To break in new shoes, wear them for just a few hours a day. This prevents injuries on  your feet. Wounds, scrapes, corns, and calluses  Check your feet daily for blisters, cuts, bruises, sores, and redness. If you cannot see the bottom of your feet, use a mirror or ask someone for help.  Do not cut corns or calluses or try to remove them with medicine.  If you find a minor scrape, cut, or break in the skin on your feet, keep it and the skin around it clean and dry. You may clean these areas with mild soap and water. Do not clean the area with peroxide, alcohol, or iodine.  If you have a wound, scrape, corn, or callus on your foot, look at it several times a day to make sure it is healing and not infected. Check for: ? Redness, swelling, or pain. ? Fluid or blood. ? Warmth. ? Pus or a bad smell. General instructions  Do not cross your legs. This may decrease blood flow to your feet.  Do not use heating pads or hot water bottles on your feet. They may burn your skin. If you have lost feeling in your feet or legs, you may not know this is happening until it is too late.  Protect your feet from hot and cold by wearing shoes, such as at the beach or on hot pavement.  Schedule a complete foot exam at least once a year (annually)  or more often if you have foot problems. If you have foot problems, report any cuts, sores, or bruises to your health care provider immediately. Contact a health care provider if:  You have a medical condition that increases your risk of infection and you have any cuts, sores, or bruises on your feet.  You have an injury that is not healing.  You have redness on your legs or feet.  You feel burning or tingling in your legs or feet.  You have pain or cramps in your legs and feet.  Your legs or feet are numb.  Your feet always feel cold.  You have pain around a toenail. Get help right away if:  You have a wound, scrape, corn, or callus on your foot and: ? You have pain, swelling, or redness that gets worse. ? You have fluid or blood coming  from the wound, scrape, corn, or callus. ? Your wound, scrape, corn, or callus feels warm to the touch. ? You have pus or a bad smell coming from the wound, scrape, corn, or callus. ? You have a fever. ? You have a red line going up your leg. Summary  Check your feet every day for cuts, sores, red spots, swelling, and blisters.  Moisturize feet and legs daily.  Wear shoes that fit properly and have enough cushioning.  If you have foot problems, report any cuts, sores, or bruises to your health care provider immediately.  Schedule a complete foot exam at least once a year (annually) or more often if you have foot problems. This information is not intended to replace advice given to you by your health care provider. Make sure you discuss any questions you have with your health care provider. Document Revised: 12/06/2018 Document Reviewed: 04/16/2016 Elsevier Patient Education  [place].

## 2020-01-03 NOTE — Progress Notes (Signed)
Pre visit review using our clinic review tool, if applicable. No additional management support is needed unless otherwise documented below in the visit note. 

## 2020-01-04 LAB — COMPREHENSIVE METABOLIC PANEL
AG Ratio: 2 (calc) (ref 1.0–2.5)
ALT: 15 U/L (ref 9–46)
AST: 17 U/L (ref 10–35)
Albumin: 4.1 g/dL (ref 3.6–5.1)
Alkaline phosphatase (APISO): 50 U/L (ref 35–144)
BUN/Creatinine Ratio: 14 (calc) (ref 6–22)
BUN: 17 mg/dL (ref 7–25)
CO2: 26 mmol/L (ref 20–32)
Calcium: 9.3 mg/dL (ref 8.6–10.3)
Chloride: 106 mmol/L (ref 98–110)
Creat: 1.23 mg/dL — ABNORMAL HIGH (ref 0.70–1.18)
Globulin: 2.1 g/dL (calc) (ref 1.9–3.7)
Glucose, Bld: 138 mg/dL — ABNORMAL HIGH (ref 65–99)
Potassium: 4.6 mmol/L (ref 3.5–5.3)
Sodium: 143 mmol/L (ref 135–146)
Total Bilirubin: 0.7 mg/dL (ref 0.2–1.2)
Total Protein: 6.2 g/dL (ref 6.1–8.1)

## 2020-01-04 LAB — LIPID PANEL
Cholesterol: 177 mg/dL (ref <200)
HDL: 34 mg/dL — ABNORMAL LOW (ref >=40)
LDL Cholesterol (Calc): 117 mg/dL (calc) — ABNORMAL HIGH
Non-HDL Cholesterol (Calc): 143 mg/dL (calc) — ABNORMAL HIGH (ref <130)
Total CHOL/HDL Ratio: 5.2 (calc) — ABNORMAL HIGH (ref <5.0)
Triglycerides: 146 mg/dL (ref <150)

## 2020-01-04 LAB — CBC WITH DIFFERENTIAL/PLATELET
Absolute Monocytes: 647 cells/uL (ref 200–950)
Basophils Absolute: 53 cells/uL (ref 0–200)
Basophils Relative: 0.8 %
Eosinophils Absolute: 218 cells/uL (ref 15–500)
Eosinophils Relative: 3.3 %
HCT: 42.9 % (ref 38.5–50.0)
Hemoglobin: 14.9 g/dL (ref 13.2–17.1)
Lymphs Abs: 1115 cells/uL (ref 850–3900)
MCH: 31.5 pg (ref 27.0–33.0)
MCHC: 34.7 g/dL (ref 32.0–36.0)
MCV: 90.7 fL (ref 80.0–100.0)
MPV: 9.9 fL (ref 7.5–12.5)
Monocytes Relative: 9.8 %
Neutro Abs: 4567 cells/uL (ref 1500–7800)
Neutrophils Relative %: 69.2 %
Platelets: 243 10*3/uL (ref 140–400)
RBC: 4.73 10*6/uL (ref 4.20–5.80)
RDW: 13.1 % (ref 11.0–15.0)
Total Lymphocyte: 16.9 %
WBC: 6.6 10*3/uL (ref 3.8–10.8)

## 2020-01-05 NOTE — Assessment & Plan Note (Signed)
--  Td 2017 - PNM 23:  2013;  prevnar 2015 - zostavax-- 11-2012 - s/p  shingrex - s/p pfizer C-19, last dose 05/2019, plans to get a booster soon. - had a flu shot  --CCS: Cscope 08-2008, neg, Cscope 10/2018, next per GI --h/o elevated PSA, f/u by urology  --rec to stay active and eat healthy --Labs: CMP, FLP, CBC

## 2020-01-05 NOTE — Assessment & Plan Note (Signed)
Here for CPX DM: Per Endo, reports Metformin was discontinued. Neuropathy: Feet care recommended. HTN: On amlodipine, BP today slightly elevated, at home consistently in the 130s.  No change Dyslipidemia: On Crestor, TriCor, checking labs Chronic cough: Saw pulmonary 08/29/2019 for chronic cough, sleep apnea, no changes made Other problems seem stable. RTC 6 to 8 months

## 2020-01-08 ENCOUNTER — Telehealth: Payer: Self-pay | Admitting: Pulmonary Disease

## 2020-01-09 MED ORDER — ROSUVASTATIN CALCIUM 40 MG PO TABS: 40.0000 mg | ORAL_TABLET | Freq: Every day | ORAL | 6 refills | Status: DC

## 2020-01-09 NOTE — Telephone Encounter (Signed)
Spoke with the pt and notified of response per Dr Halford Chessman. Pt verbalized understanding  Nothing further needed

## 2020-01-09 NOTE — Telephone Encounter (Signed)
Spoke with patient who states that his CPAP has been recalled and that he has reached out to the [employer] people and they registered his machine for him and verified that it has been recalled. States he was told to reach out to his provider to see if he is ok to continue to use it telling him that the doctor had to approve for him to continue to use it until her gets a new machine. Patient can be reached at [phone]  Dr. Halford Chessman please advise

## 2020-01-09 NOTE — Telephone Encounter (Signed)
He has mild sleep apnea.  He can try sleeping w/o CPAP for now until he gets replacement CPAP.  If he has trouble sleeping, then he should resume using his current CPAP machine until he gets a replacement.

## 2020-01-09 NOTE — Telephone Encounter (Signed)
Pt returning a phone call, Pt can be reached at [phone].

## 2020-01-09 NOTE — Telephone Encounter (Signed)
LMTCB

## 2020-01-09 NOTE — Addendum Note (Signed)
Addended byDamita Dunnings D on: 01/09/2020 07:47 AM   Modules accepted: Orders

## 2020-01-12 ENCOUNTER — Other Ambulatory Visit: Payer: Self-pay | Admitting: Gastroenterology

## 2020-01-14 ENCOUNTER — Telehealth: Payer: Self-pay | Admitting: *Deleted

## 2020-01-14 MED ORDER — LANSOPRAZOLE 30 MG PO CPDR: DELAYED_RELEASE_CAPSULE | ORAL | 0 refills | Status: DC

## 2020-01-14 NOTE — Telephone Encounter (Signed)
Fax refill request from pharmacy sent lansoprazole electronically today

## 2020-01-17 DIAGNOSIS — E230 Hypopituitarism: Secondary | ICD-10-CM | POA: Diagnosis not present

## 2020-01-17 DIAGNOSIS — E119 Type 2 diabetes mellitus without complications: Secondary | ICD-10-CM | POA: Diagnosis not present

## 2020-01-17 LAB — HEMOGLOBIN A1C: Hemoglobin A1C: 6.6

## 2020-01-17 LAB — HM DIABETES FOOT EXAM

## 2020-01-20 ENCOUNTER — Other Ambulatory Visit: Payer: Self-pay | Admitting: Internal Medicine

## 2020-02-08 ENCOUNTER — Other Ambulatory Visit: Payer: Self-pay | Admitting: Internal Medicine

## 2020-02-14 ENCOUNTER — Encounter: Payer: Self-pay | Admitting: Internal Medicine

## 2020-03-14 ENCOUNTER — Other Ambulatory Visit: Payer: Self-pay | Admitting: Internal Medicine

## 2020-04-10 ENCOUNTER — Other Ambulatory Visit (INDEPENDENT_AMBULATORY_CARE_PROVIDER_SITE_OTHER): Payer: Medicare PPO

## 2020-04-10 ENCOUNTER — Other Ambulatory Visit: Payer: Self-pay

## 2020-04-10 DIAGNOSIS — E785 Hyperlipidemia, unspecified: Secondary | ICD-10-CM

## 2020-04-10 LAB — COMPREHENSIVE METABOLIC PANEL
ALT: 18 U/L (ref 0–53)
AST: 22 U/L (ref 0–37)
Albumin: 4.3 g/dL (ref 3.5–5.2)
Alkaline Phosphatase: 50 U/L (ref 39–117)
BUN: 17 mg/dL (ref 6–23)
CO2: 33 mEq/L — ABNORMAL HIGH (ref 19–32)
Calcium: 9.1 mg/dL (ref 8.4–10.5)
Chloride: 105 mEq/L (ref 96–112)
Creatinine, Ser: 1.08 mg/dL (ref 0.40–1.50)
GFR: 68.05 mL/min (ref >60.00)
Glucose, Bld: 126 mg/dL — ABNORMAL HIGH (ref 70–99)
Potassium: 4.2 mEq/L (ref 3.5–5.1)
Sodium: 141 mEq/L (ref 135–145)
Total Bilirubin: 0.9 mg/dL (ref 0.2–1.2)
Total Protein: 6.3 g/dL (ref 6.0–8.3)

## 2020-04-10 LAB — LIPID PANEL
Cholesterol: 154 mg/dL (ref 0–200)
HDL: 40.4 mg/dL (ref >39.00)
LDL Cholesterol: 97 mg/dL (ref 0–99)
NonHDL: 113.47
Total CHOL/HDL Ratio: 4
Triglycerides: 84 mg/dL (ref 0.0–149.0)
VLDL: 16.8 mg/dL (ref 0.0–40.0)

## 2020-04-10 NOTE — Addendum Note (Signed)
Addended by: Kelle Darting A on: 04/10/2020 09:18 AM   Modules accepted: Orders

## 2020-04-11 MED ORDER — ROSUVASTATIN CALCIUM 40 MG PO TABS: 40.0000 mg | ORAL_TABLET | Freq: Every day | ORAL | 1 refills | Status: DC

## 2020-04-11 NOTE — Addendum Note (Signed)
Addended byDamita Dunnings D on: 04/11/2020 01:56 PM   Modules accepted: Orders

## 2020-04-21 ENCOUNTER — Other Ambulatory Visit: Payer: Self-pay | Admitting: Gastroenterology

## 2020-05-01 ENCOUNTER — Telehealth: Payer: Self-pay | Admitting: Pulmonary Disease

## 2020-05-02 MED ORDER — HYDROCODONE-HOMATROPINE 5-1.5 MG/5ML PO SYRP: 5.0000 mL | ORAL_SOLUTION | Freq: Four times a day (QID) | ORAL | 0 refills | Status: DC | PRN

## 2020-05-02 NOTE — Telephone Encounter (Signed)
Pt is calling to get hycodan refilled.    Last OV was  08/29/2019 Next OV no pending appts  This medication was last filled on 12/25/2019 for 240 ml  VS please advise on refill thanks.

## 2020-05-02 NOTE — Telephone Encounter (Signed)
Called and spoke with pt letting him know that TP sent partial Rx of hycodan to pharmacy for him and stated to him that VS could further review and pt verbalized understanding. Nothing further needed.

## 2020-05-02 NOTE — Telephone Encounter (Signed)
Dr. Halford Chessman is on vacation  Tammy please advise on medication  Patient has called 3 times today

## 2020-05-02 NOTE — Telephone Encounter (Signed)
Please let him know we do not refill narcotic medications on routine basis , it is filled by the prescribing provider.  Dr. Halford Chessman  Is not back until 2/16.   I reviewed notes from Dr. Halford Chessman  And on routine basis refills every 3-4 months only . Checked the PMP and confirmed limited refills, last received 11/2019   Will refill x 1 with 120cc only and can discuss with Dr. Halford Chessman  On return ov going forward for future refills .   Please contact office for sooner follow up if symptoms do not improve or worsen or seek emergency care

## 2020-05-06 DIAGNOSIS — G4733 Obstructive sleep apnea (adult) (pediatric): Secondary | ICD-10-CM | POA: Diagnosis not present

## 2020-05-23 ENCOUNTER — Ambulatory Visit (INDEPENDENT_AMBULATORY_CARE_PROVIDER_SITE_OTHER): Payer: Medicare PPO | Admitting: Gastroenterology

## 2020-05-23 ENCOUNTER — Encounter: Payer: Self-pay | Admitting: Gastroenterology

## 2020-05-23 VITALS — BP 150/80 | HR 64 | Ht 73.0 in | Wt 275.0 lb

## 2020-05-23 DIAGNOSIS — K219 Gastro-esophageal reflux disease without esophagitis: Secondary | ICD-10-CM

## 2020-05-23 DIAGNOSIS — R053 Chronic cough: Secondary | ICD-10-CM | POA: Diagnosis not present

## 2020-05-23 DIAGNOSIS — R0781 Pleurodynia: Secondary | ICD-10-CM | POA: Diagnosis not present

## 2020-05-23 MED ORDER — LANSOPRAZOLE 30 MG PO CPDR: 30.0000 mg | DELAYED_RELEASE_CAPSULE | Freq: Two times a day (BID) | ORAL | 3 refills | Status: DC

## 2020-05-23 NOTE — Patient Instructions (Signed)
We will refill Lansoprazole for you today  Use OTC Lidocaine  Patch, apply as needed   Conn's Current Therapy 2021 (pp. 213-216). [place], [place]: Elsevier.">  Gastroesophageal Reflux Disease, Adult Gastroesophageal reflux (GER) happens when acid from the stomach flows up into the tube that connects the mouth and the stomach (esophagus). Normally, food travels down the esophagus and stays in the stomach to be digested. However, when a person has GER, food and stomach acid sometimes move back up into the esophagus. If this becomes a more serious problem, the person may be diagnosed with a disease called gastroesophageal reflux disease (GERD). GERD occurs when the reflux:  Happens often.  Causes frequent or severe symptoms.  Causes problems such as damage to the esophagus. When stomach acid comes in contact with the esophagus, the acid may cause inflammation in the esophagus. Over time, GERD may create small holes (ulcers) in the lining of the esophagus. What are the causes? This condition is caused by a problem with the muscle between the esophagus and the stomach (lower esophageal sphincter, or LES). Normally, the LES muscle closes after food passes through the esophagus to the stomach. When the LES is weakened or abnormal, it does not close properly, and that allows food and stomach acid to go back up into the esophagus. The LES can be weakened by certain dietary substances, medicines, and medical conditions, including:  Tobacco use.  Pregnancy.  Having a hiatal hernia.  Alcohol use.  Certain foods and beverages, such as coffee, chocolate, onions, and peppermint. What increases the risk? You are more likely to develop this condition if you:  Have an increased body weight.  Have a connective tissue disorder.  Take NSAIDs, such as ibuprofen. What are the signs or symptoms? Symptoms of this condition include:  Heartburn.  Difficult or painful swallowing and the feeling of  having a lump in the throat.  A bitter taste in the mouth.  Bad breath and having a large amount of saliva.  Having an upset or bloated stomach and belching.  Chest pain. Different conditions can cause chest pain. Make sure you see your health care provider if you experience chest pain.  Shortness of breath or wheezing.  Ongoing (chronic) cough or a nighttime cough.  Wearing away of tooth enamel.  Weight loss. How is this diagnosed? This condition may be diagnosed based on a medical history and a physical exam. To determine if you have mild or severe GERD, your health care provider may also monitor how you respond to treatment. You may also have tests, including:  A test to examine your stomach and esophagus with a small camera (endoscopy).  A test that measures the acidity level in your esophagus.  A test that measures how much pressure is on your esophagus.  A barium swallow or modified barium swallow test to show the shape, size, and functioning of your esophagus. How is this treated? Treatment for this condition may vary depending on how severe your symptoms are. Your health care provider may recommend:  Changes to your diet.  Medicine.  Surgery. The goal of treatment is to help relieve your symptoms and to prevent complications. Follow these instructions at home: Eating and drinking  Follow a diet as recommended by your health care provider. This may involve avoiding foods and drinks such as: ? Coffee and tea, with or without caffeine. ? Drinks that contain alcohol. ? Energy drinks and sports drinks. ? Carbonated drinks or sodas. ? Chocolate and cocoa. ? Peppermint  and mint flavorings. ? Garlic and onions. ? Horseradish. ? Spicy and acidic foods, including peppers, chili powder, curry powder, vinegar, hot sauces, and barbecue sauce. ? Citrus fruit juices and citrus fruits, such as oranges, lemons, and limes. ? Tomato-based foods, such as red sauce, chili, salsa,  and pizza with red sauce. ? Fried and fatty foods, such as donuts, french fries, potato chips, and high-fat dressings. ? High-fat meats, such as hot dogs and fatty cuts of red and white meats, such as rib eye steak, sausage, ham, and bacon. ? High-fat dairy items, such as whole milk, butter, and cream cheese.  Eat small, frequent meals instead of large meals.  Avoid drinking large amounts of liquid with your meals.  Avoid eating meals during the 2-3 hours before bedtime.  Avoid lying down right after you eat.  Do not exercise right after you eat.   Lifestyle  Do not use any products that contain nicotine or tobacco. These products include cigarettes, chewing tobacco, and vaping devices, such as e-cigarettes. If you need help quitting, ask your health care provider.  Try to reduce your stress by using methods such as yoga or meditation. If you need help reducing stress, ask your health care provider.  If you are overweight, reduce your weight to an amount that is healthy for you. Ask your health care provider for guidance about a safe weight loss goal.   General instructions  Pay attention to any changes in your symptoms.  Take over-the-counter and prescription medicines only as told by your health care provider. Do not take aspirin, ibuprofen, or other NSAIDs unless your health care provider told you to take these medicines.  Wear loose-fitting clothing. Do not wear anything tight around your waist that causes pressure on your abdomen.  Raise (elevate) the head of your bed about 6 inches (15 cm). You can use a wedge to do this.  Avoid bending over if this makes your symptoms worse.  Keep all follow-up visits. This is important. Contact a health care provider if:  You have: ? New symptoms. ? Unexplained weight loss. ? Difficulty swallowing or it hurts to swallow. ? Wheezing or a persistent cough. ? A hoarse voice.  Your symptoms do not improve with treatment. Get help right  away if:  You have sudden pain in your arms, neck, jaw, teeth, or back.  You suddenly feel sweaty, dizzy, or light-headed.  You have chest pain or shortness of breath.  You vomit and the vomit is green, yellow, or black, or it looks like blood or coffee grounds.  You faint.  You have stool that is red, bloody, or black.  You cannot swallow, drink, or eat. These symptoms may represent a serious problem that is an emergency. Do not wait to see if the symptoms will go away. Get medical help right away. Call your local emergency services (911 in the [place] Do not drive yourself to the hospital. Summary  Gastroesophageal reflux happens when acid from the stomach flows up into the esophagus. GERD is a disease in which the reflux happens often, causes frequent or severe symptoms, or causes problems such as damage to the esophagus.  Treatment for this condition may vary depending on how severe your symptoms are. Your health care provider may recommend diet and lifestyle changes, medicine, or surgery.  Contact a health care provider if you have new or worsening symptoms.  Take over-the-counter and prescription medicines only as told by your health care provider. Do not take aspirin,  ibuprofen, or other NSAIDs unless your health care provider told you to do so.  Keep all follow-up visits as told by your health care provider. This is important. This information is not intended to replace advice given to you by your health care provider. Make sure you discuss any questions you have with your health care provider. Document Revised: 09/24/2019 Document Reviewed: 09/24/2019 Elsevier Patient Education  [place].  I appreciate the  opportunity to care for you  Thank You   Harl Bowie , MD

## 2020-05-23 NOTE — Progress Notes (Signed)
Ronald King    [id number]    May 19, 1945  Primary Care Physician:Paz, Alda Berthold, MD  Referring Physician: Colon Branch, MD [address] [place],  [name] [address]   Chief complaint:  GERD  HPI:  75 year old very pleasant gentleman with history of chronic GERD status post Nissen fundoplication March 9371. Recurrent GERD symptoms, he is on PPI with good control of Symptoms.  Denies any dysphagia, odynophagia, vomiting, weight loss, change in bowel habits, melena or blood per rectum.  Review of system positive for rib pain on the right side and cough  EGD February 23, 2017 showed normal esophagus, intact Nissen fundoplication, normal stomach and duodenum  Colonoscopy November 08, 2018: - Two 1 to 2 mm polyps in the transverse colon and in the cecum, removed with a cold biopsy forceps. Resected and retrieved. - Five 4 to 7 mm polyps in the transverse colon, in the ascending colon and in the cecum, removed with a cold snare. Resected and retrieved. - Diverticulosis in the sigmoid colon and in the descending - Non-bleeding internal hemorrhoids.  Outpatient Encounter Medications as of 05/23/2020  Medication Sig  . amLODipine (NORVASC) 10 MG tablet Take 1 tablet (10 mg total) by mouth daily.  Marland Kitchen amoxicillin (AMOXIL) 500 MG tablet Take 1 tablet (500 mg total) by mouth 3 (three) times daily. (Patient not taking: Reported on 01/03/2020)  . ascorbic acid (VITAMIN C) 500 MG tablet Take 1,000 mg by mouth daily.   Marland Kitchen aspirin 81 MG tablet Take 81 mg by mouth daily.    . Calcium Carbonate-Vitamin D (RA CALCIUM PLUS VITAMIN D) 600-400 MG-UNIT per tablet Take 1 tablet by mouth daily.    . clonazePAM (KLONOPIN) 1 MG tablet Take 1 mg by mouth 2 (two) times daily.   . Coenzyme Q10 (CO Q 10 PO) Take 1 tablet by mouth daily.   . Cyanocobalamin (B-12) 2000 MCG TABS Take 1 tablet by mouth daily.  . fenofibrate (TRICOR) 145 MG tablet Take 1 tablet (145 mg total) by mouth daily.   Marland Kitchen HYDROcodone-homatropine (HYCODAN) 5-1.5 MG/5ML syrup Take 5 mLs by mouth every 6 (six) hours as needed for cough.  . lansoprazole (PREVACID) 30 MG capsule TAKE 1 CAPSULE(30 MG) BY MOUTH TWICE DAILY  . Multiple Vitamins-Minerals (CENTRUM SILVER PO) Take 1 tablet by mouth daily. Reported on 03/17/2015  . PARoxetine (PAXIL) 20 MG tablet Take 20 mg by mouth daily.  Marland Kitchen pyridOXINE (VITAMIN B-6) 100 MG tablet Take 100 mg by mouth daily.  . rosuvastatin (CRESTOR) 40 MG tablet Take 1 tablet (40 mg total) by mouth at bedtime.  . sucralfate (CARAFATE) 1 g tablet TAKE 1 TABLET(1 GRAM) BY MOUTH TWICE DAILY (Patient taking differently: as needed. )   No facility-administered encounter medications on file as of 05/23/2020.    Allergies as of 05/23/2020 - Review Complete 01/05/2020  Allergen Reaction Noted  . Montelukast Shortness Of Breath 12/10/2015  . Ceftin [cefuroxime axetil]  02/10/2017  . Cymbalta [duloxetine hcl]  12/14/2018  . Duloxetine  12/12/2015  . Lyrica [pregabalin] Other (See Comments) 12/10/2015  . Neurontin [gabapentin] Other (See Comments) 12/10/2015    Past Medical History:  Diagnosis Date  . Allergy   . Anemia   . Anxiety   . Blood transfusion without reported diagnosis   . Chronic bronchitis   . Chronic cough   . Diabetes ([facility]) 07-2011   per Dr Buddy Duty  . DJD (degenerative joint  disease)   . Dyslipidemia   . Esophageal polyp   . GERD (gastroesophageal reflux disease)    s/p [employer] F.  . Hemorrhoids, internal   . History of gastrointestinal hemorrhage   . Hypertension   . Hypogonadism, male    per Dr Buddy Duty  . Neuromuscular disorder ([facility])    neuropathy feet  . Neuropathy    per neuro-Dr Ferrarou vicodin  . OSA (obstructive sleep apnea)    on CPAP  . Sleep apnea    CPAP at night  . Vitamin D deficiency     Past Surgical History:  Procedure Laterality Date  . ANAL FISSURE REPAIR    . hand fracture surgery     left  . KNEE ARTHROSCOPY  2007   Dr. Marlou Sa  .  LAPAROSCOPIC NISSEN FUNDOPLICATION  09/9022   Dr. Hassell Done  . NOSE SURGERY    . TONSILLECTOMY    . UPPER GASTROINTESTINAL ENDOSCOPY      Family History  Problem Relation Age of Onset  . Cerebral aneurysm Father   . Alcohol abuse Father   . Neuropathy Mother   . Heart disease Mother        died at age 56  . Migraines Brother        several  . Diabetes Maternal Uncle   . CAD Brother        CABG in his 12s  . Colon cancer Neg Hx   . Prostate cancer Neg Hx   . Esophageal cancer Neg Hx   . Stomach cancer Neg Hx   . Rectal cancer Neg Hx     Social History   Socioeconomic History  . Marital status: Married    Spouse name: Not on file  . Number of children: 1  . Years of education: Not on file  . Highest education level: Not on file  Occupational History  . Occupation: Technical brewer, retired    Comment: retired  . Occupation: minister    Comment: [employer], [employer]  Tobacco Use  . Smoking status: Former Smoker    Packs/day: 1.00    Years: 3.00    Pack years: 3.00    Quit date: 03/29/1965    Years since quitting: 55.1  . Smokeless tobacco: Never Used  Vaping Use  . Vaping Use: Never used  Substance and Sexual Activity  . Alcohol use: No  . Drug use: No  . Sexual activity: Not on file  Other Topics Concern  . Not on file  Social History Narrative   Lives w/ wife   Social Determinants of Health   Financial Resource Strain: Not on file  Food Insecurity: Not on file  Transportation Needs: Not on file  Physical Activity: Not on file  Stress: Not on file  Social Connections: Not on file  Intimate Partner Violence: Not on file      Review of systems: All other review of systems negative except as mentioned in the HPI.   Physical Exam: Vitals:   05/23/20 0947  BP: (!) 150/80  Pulse: 64   Body mass index is 36.28 kg/m. Gen:      No acute distress HEENT:  sclera anicteric Abd:      soft, non-tender; no palpable masses, no distension Ext:     No edema Neuro: alert and oriented x 3 Psych: normal mood and affect  Data Reviewed:  Reviewed labs, radiology imaging, old records and pertinent past GI work up   Assessment and Plan/Recommendations:  75 year old  very pleasant gentleman with history of chronic GERD s/p fundoplication with recurrent GERD symptoms currently under good control on lansoprazole daily Continue PPI and antireflux measures  History of multiple adenomatous colon polyps, due for surveillance colonoscopy in August 2023  Complaints of right-sided rib pain worse with cough, no injury or rib fracture: Advised patient to apply over-the-counter lidocaine patch but if continues to have persistent symptoms contact PMD  Return in 1 year or sooner if needed  This visit required 30 minutes of patient care (this includes precharting, chart review, review of results, face-to-face time used for counseling as well as treatment plan and follow-up. The patient was provided an opportunity to ask questions and all were answered. The patient agreed with the plan and demonstrated an understanding of the instructions.  Damaris Hippo , MD    CC: Colon Branch, MD

## 2020-06-10 ENCOUNTER — Other Ambulatory Visit: Payer: Self-pay

## 2020-06-10 ENCOUNTER — Ambulatory Visit: Payer: Medicare PPO | Admitting: Pulmonary Disease

## 2020-06-10 ENCOUNTER — Encounter: Payer: Self-pay | Admitting: Pulmonary Disease

## 2020-06-10 VITALS — BP 130/86 | HR 67 | Temp 98.2°F | Ht 73.0 in | Wt 272.6 lb

## 2020-06-10 DIAGNOSIS — R058 Other specified cough: Secondary | ICD-10-CM | POA: Diagnosis not present

## 2020-06-10 DIAGNOSIS — K219 Gastro-esophageal reflux disease without esophagitis: Secondary | ICD-10-CM

## 2020-06-10 DIAGNOSIS — R053 Chronic cough: Secondary | ICD-10-CM

## 2020-06-10 DIAGNOSIS — G4733 Obstructive sleep apnea (adult) (pediatric): Secondary | ICD-10-CM | POA: Diagnosis not present

## 2020-06-10 MED ORDER — AMOXICILLIN 500 MG PO TABS: 500.0000 mg | ORAL_TABLET | Freq: Three times a day (TID) | ORAL | 1 refills | Status: DC

## 2020-06-10 MED ORDER — HYDROCODONE-HOMATROPINE 5-1.5 MG/5ML PO SYRP: 5.0000 mL | ORAL_SOLUTION | Freq: Four times a day (QID) | ORAL | 0 refills | Status: DC | PRN

## 2020-06-10 NOTE — Progress Notes (Signed)
[facility]  Chief Complaint  Patient presents with  . Follow-up    6 month f/u. States his cough has not changed since last visit. Non-productive cough. SOB has been stable as well. Still using cpap every night.     Constitutional:  BP 130/86   Pulse 67   Temp 98.2 F (36.8 C) (Temporal)   Ht 6\' 1"  (1.854 m)   Wt 272 lb 9.6 oz (123.7 kg)   SpO2 96% Comment: on RA  BMI 35.97 kg/m   Past Medical History:  HTN, HLD, GERD, IBS, hemorrhoids, BPH, ED, DJD, HA, Vit D deficiency, Neuropathy, Anxiety  Past Surgical History:  Ronald King  has a past surgical history that includes Laparoscopic Nissen fundoplication (10/2991); Knee arthroscopy (2007); Tonsillectomy; Nose surgery; Anal fissure repair; hand fracture surgery; and Upper gastrointestinal endoscopy.  Brief Summary:  Ronald King is a 75 y.o. male former smoker with cough and obstructive sleep apnea.  Former patient of Dr. Lenna Gilford.      Subjective:   Ronald King ran out of amoxicillin and hycodan.  Ronald King is getting that burn in his left lung again and knows it is from arthritis.  Ronald King is concerned that if Ronald King can start his regimen again Ronald King will start to cough up liquid again and his left lung will fill with fluid.  The only thing that works then is a zpak, but Ronald King doesn't want to use this until absolutely necessary.  Ronald King uses CPAP nightly.  No issues with mask fit.  Physical Exam:   Appearance - well kempt   ENMT - no sinus tenderness, no oral exudate, no LAN, Mallampati 3 airway, no stridor  Respiratory - equal breath sounds bilaterally, no wheezing or rales  CV - s1s2 regular rate and rhythm, no murmurs  Ext - no clubbing, no edema  Skin - no rashes  Psych - normal mood and affect   Pulmonary testing:   Spirometry September 2013 >> FEV 1 3.46 (86%), FEV1% 87  RAST 11/15/18 >> grasses, IgE 87  PFT 12/25/18 >> FEV1 3.13 (91%), FEV1% 86, TLC 6.14 (82%), DLCO 103%, +BD from FEF  25-75%  Sleep Tests:   PSG 09/02/97 >> AHI 3  CPAP 05/10/20 to 06/08/20 >> used on 30 of 30 nights with average 8 hrs 46 min.  Average AHI 10.1 with CPAP 16 cm H2O  Social History:  Ronald King  reports that Ronald King quit smoking about 55 years ago. Ronald King has a 3.00 pack-year smoking history. Ronald King has never used smokeless tobacco. Ronald King reports that Ronald King does not drink alcohol and does not use drugs.  Family History:  His family history includes Alcohol abuse in his father; CAD in his brother; Cerebral aneurysm in his father; Diabetes in his maternal uncle; Heart disease in his mother; Migraines in his brother; Neuropathy in his mother.     Assessment/Plan:   Chronic cough. 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 - previously seen by Dr. Janace Hoard with ENT - nasal steroids sprays reported  to be ineffective for him - uses afrin and nasal irrigation  Sleep apnea, NOS. - Ronald King is compliant with CPAP and reports benefit - continue CPAP 16 cm H2O  GERD. - followed by Dr. Silverio Decamp with GI  Time Spent Involved in Patient Care on Day of Examination:  25 minutes  Follow up:  Patient Instructions  Will send in refills for amoxicillin and hycodan  Email if your symptoms don't improve  Follow up in 6 months   Medication List:   Allergies as of 06/10/2020      Reactions   Montelukast Shortness Of Breath   Ceftin [cefuroxime Axetil]    Cymbalta [duloxetine Hcl]    Duloxetine    Other reaction(s): Other (See Comments) refuses   Lyrica [pregabalin] Other (See Comments)   Drowsiness    Neurontin [gabapentin] Other (See Comments)   Drowsiness       Medication List       Accurate as of June 10, 2020 12:56 PM. If you have any questions, ask your nurse or doctor.        amLODipine 10 MG tablet Commonly known as: NORVASC Take 1 tablet (10 mg total) by mouth daily.   amoxicillin 500 MG tablet Commonly known as: AMOXIL Take 1 tablet (500 mg total) by mouth 3 (three) times daily. What changed: additional instructions   ascorbic acid 500 MG tablet Commonly known as: VITAMIN C Take 1,000 mg by mouth daily.   aspirin 81 MG tablet Take 81 mg by mouth daily.   B-12 2000 MCG Tabs Take 1 tablet by mouth daily.   Calcium Carbonate-Vitamin D 600-400 MG-UNIT tablet Take 1 tablet by mouth daily.   CENTRUM SILVER PO Take 1 tablet by mouth daily. Reported on 03/17/2015   clonazePAM 1 MG tablet Commonly known as: KLONOPIN Take 1 mg by mouth 2 (two) times daily.   CO Q 10 PO Take 1 tablet by mouth daily.   fenofibrate 145 MG tablet Commonly known as: TRICOR Take 1 tablet (145 mg total) by mouth daily.   HYDROcodone-homatropine 5-1.5 MG/5ML syrup Commonly known as: HYCODAN Take 5 mLs by mouth every 6 (six) hours as needed for cough.   lansoprazole 30 MG capsule Commonly known as: PREVACID Take 1 capsule (30 mg total) by mouth 2 (two) times daily before a meal.   PARoxetine 20 MG tablet Commonly known as: PAXIL Take 20 mg by mouth daily.   pyridOXINE 100 MG tablet Commonly known as: VITAMIN B-6 Take 100 mg by mouth daily.   rosuvastatin 40 MG tablet Commonly known as: Crestor Take 1 tablet (40 mg total) by mouth at bedtime.   sucralfate 1 g tablet Commonly known as: CARAFATE TAKE 1 TABLET(1 GRAM) BY MOUTH TWICE DAILY What changed: See the new instructions.       Signature:  Chesley Mires, MD [place] Pager - [phone] 06/10/2020, 12:56 PM

## 2020-06-10 NOTE — Addendum Note (Signed)
Addended by: Chesley Mires on: 06/10/2020 01:09 PM   Modules accepted: Orders

## 2020-06-10 NOTE — Patient Instructions (Signed)
Will send in refills for amoxicillin and hycodan  Email if your symptoms don't improve  Follow up in 6 months

## 2020-06-12 ENCOUNTER — Encounter: Payer: Self-pay | Admitting: Gastroenterology

## 2020-06-12 DIAGNOSIS — L820 Inflamed seborrheic keratosis: Secondary | ICD-10-CM | POA: Diagnosis not present

## 2020-06-12 DIAGNOSIS — L905 Scar conditions and fibrosis of skin: Secondary | ICD-10-CM | POA: Diagnosis not present

## 2020-06-12 DIAGNOSIS — L918 Other hypertrophic disorders of the skin: Secondary | ICD-10-CM | POA: Diagnosis not present

## 2020-06-12 DIAGNOSIS — L814 Other melanin hyperpigmentation: Secondary | ICD-10-CM | POA: Diagnosis not present

## 2020-06-12 DIAGNOSIS — L821 Other seborrheic keratosis: Secondary | ICD-10-CM | POA: Diagnosis not present

## 2020-06-12 DIAGNOSIS — D225 Melanocytic nevi of trunk: Secondary | ICD-10-CM | POA: Diagnosis not present

## 2020-06-12 DIAGNOSIS — Z872 Personal history of diseases of the skin and subcutaneous tissue: Secondary | ICD-10-CM | POA: Diagnosis not present

## 2020-06-24 ENCOUNTER — Encounter: Payer: Self-pay | Admitting: Internal Medicine

## 2020-07-17 DIAGNOSIS — E119 Type 2 diabetes mellitus without complications: Secondary | ICD-10-CM | POA: Diagnosis not present

## 2020-07-17 DIAGNOSIS — E230 Hypopituitarism: Secondary | ICD-10-CM | POA: Diagnosis not present

## 2020-07-17 LAB — HM DIABETES FOOT EXAM: HM Diabetic Foot Exam: NORMAL

## 2020-07-17 LAB — HEMOGLOBIN A1C: Hemoglobin A1C: 7.3

## 2020-07-19 ENCOUNTER — Other Ambulatory Visit: Payer: Self-pay | Admitting: Gastroenterology

## 2020-07-28 ENCOUNTER — Encounter: Payer: Self-pay | Admitting: Internal Medicine

## 2020-08-07 ENCOUNTER — Ambulatory Visit: Payer: Medicare PPO | Admitting: Internal Medicine

## 2020-08-19 DIAGNOSIS — G4733 Obstructive sleep apnea (adult) (pediatric): Secondary | ICD-10-CM | POA: Diagnosis not present

## 2020-08-20 IMAGING — DX CHEST - 2 VIEW
2 series · 2 of 2 positions shown · non-contrast
Comparison: Radiographs December 06, 2017.

CLINICAL DATA: Chronic cough.

EXAM:
CHEST - 2 VIEW

[chest pa]
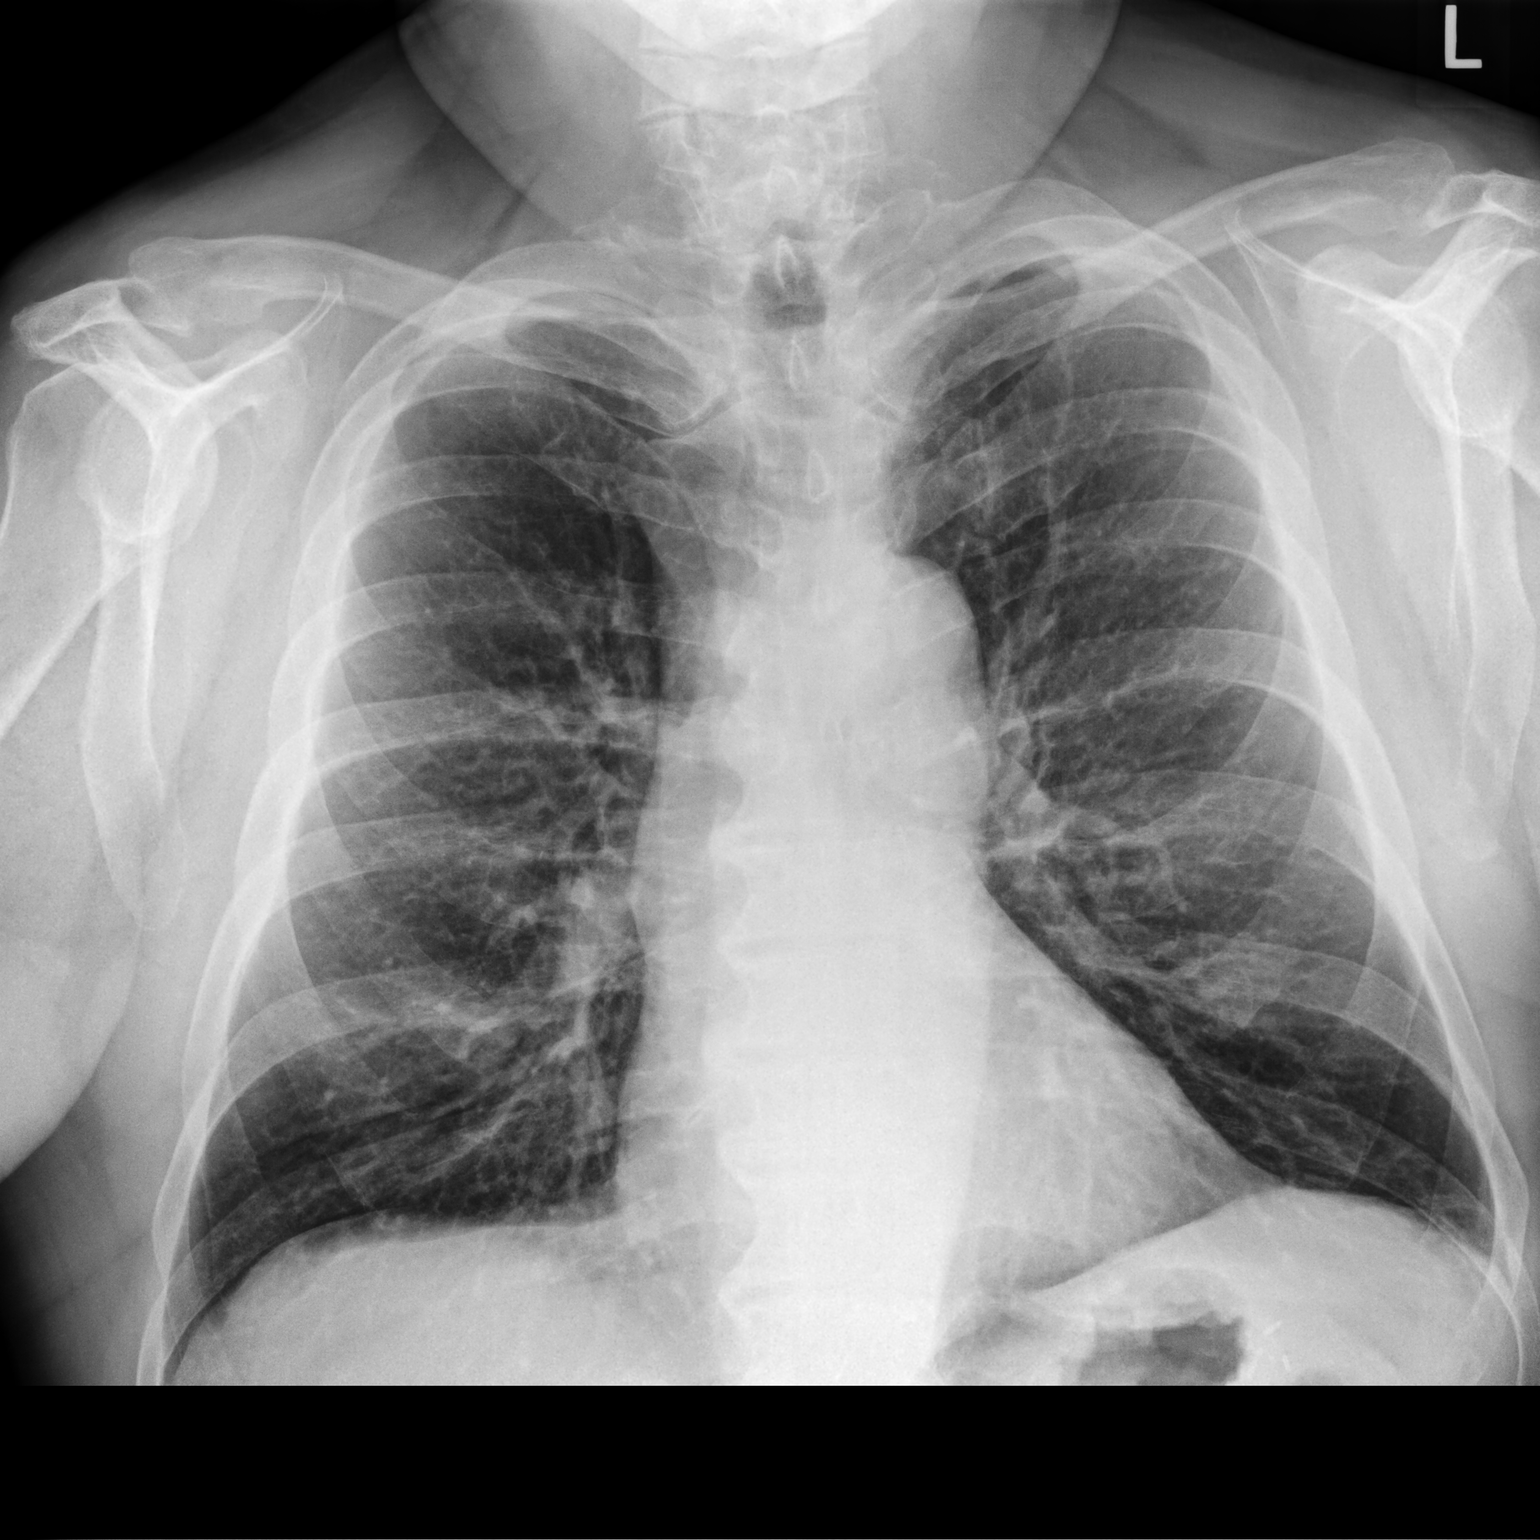

[chest lat]
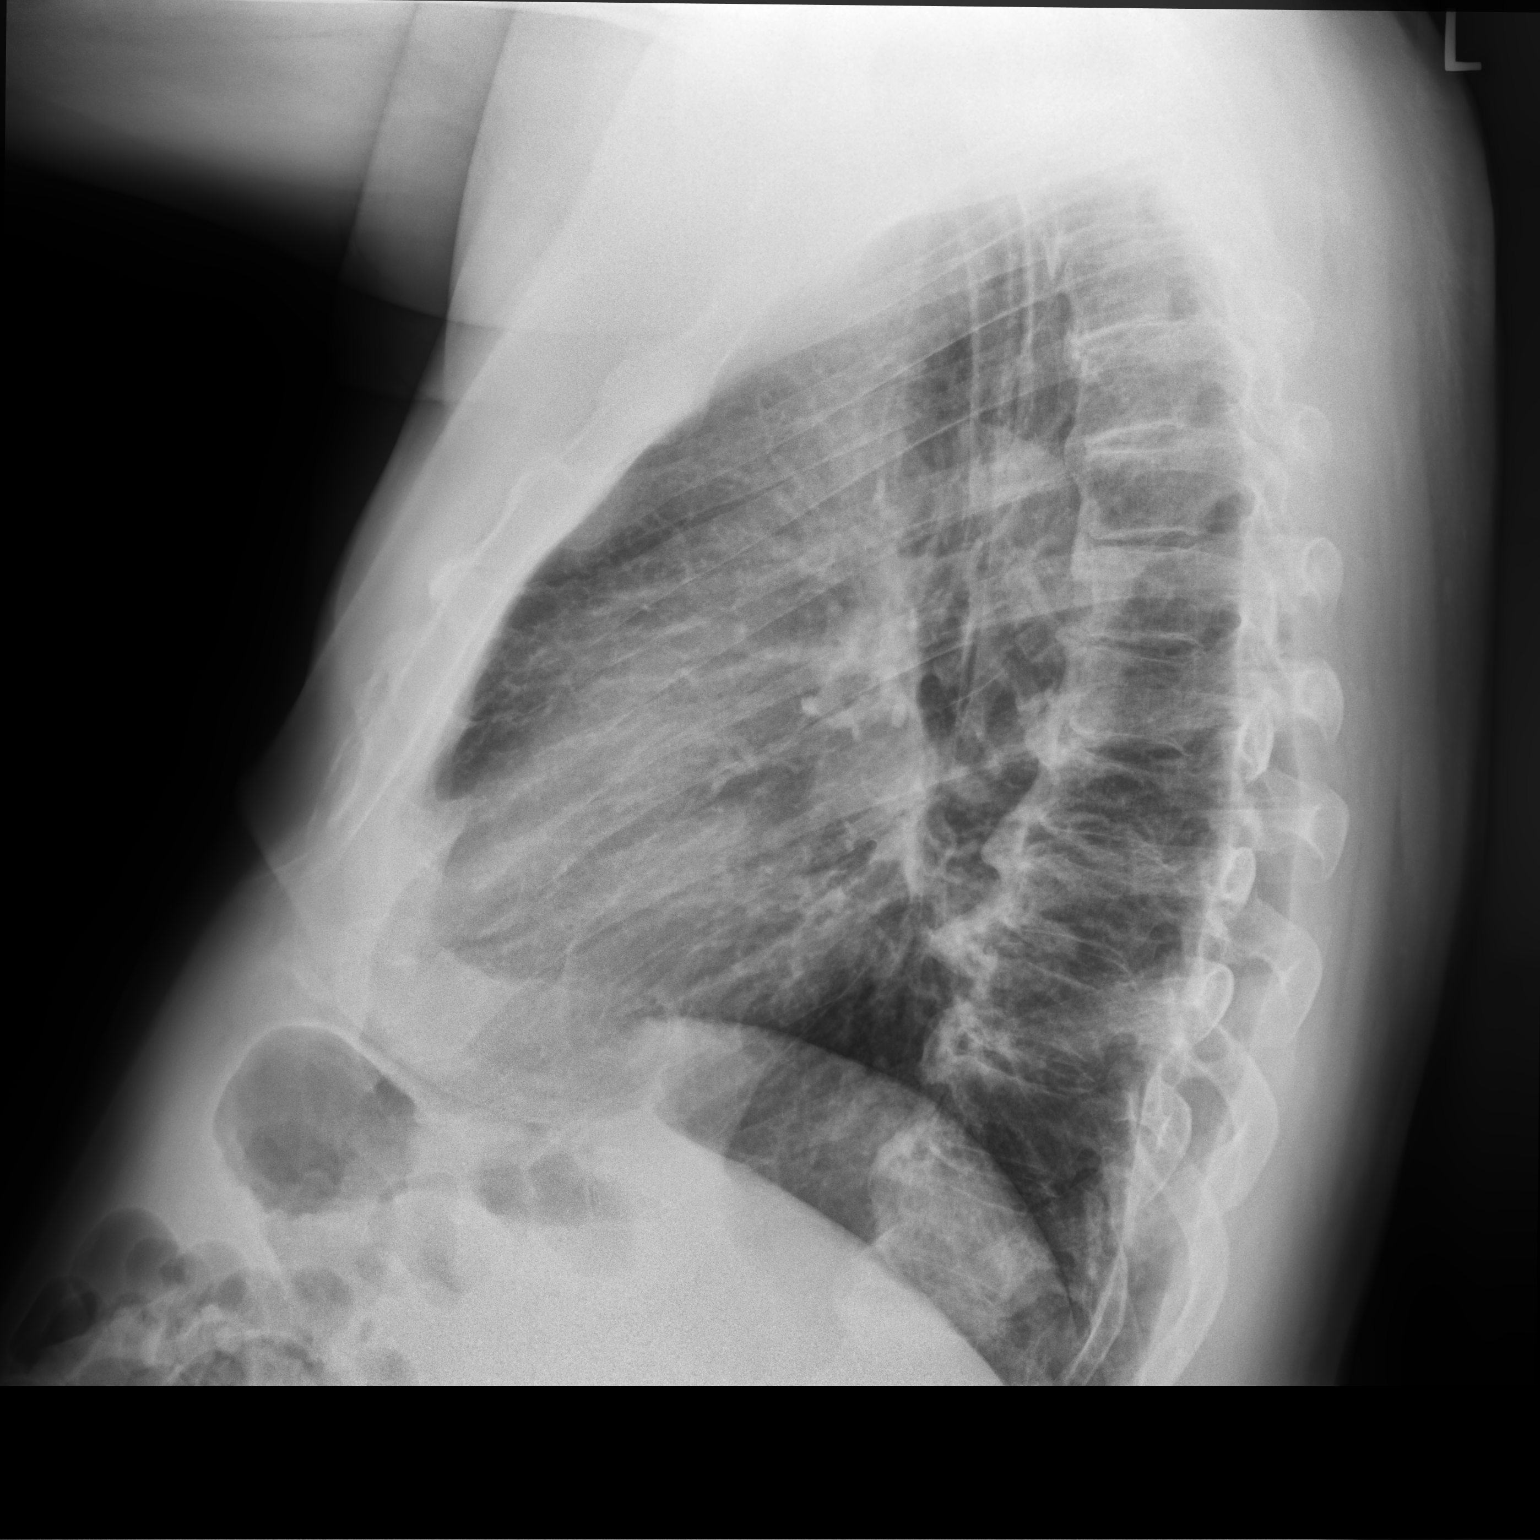

[2 of 2 positions shown; findings below may reference images not displayed]

FINDINGS: The heart size and mediastinal contours are within normal limits.
Both lungs are clear. The visualized skeletal structures are
unremarkable.
IMPRESSION: No active cardiopulmonary disease.

## 2020-08-21 ENCOUNTER — Encounter: Payer: Self-pay | Admitting: Internal Medicine

## 2020-08-21 ENCOUNTER — Ambulatory Visit (INDEPENDENT_AMBULATORY_CARE_PROVIDER_SITE_OTHER): Payer: Medicare PPO | Admitting: Internal Medicine

## 2020-08-21 ENCOUNTER — Other Ambulatory Visit: Payer: Self-pay

## 2020-08-21 VITALS — BP 136/74 | HR 71 | Temp 98.4°F | Resp 16 | Ht 76.0 in | Wt 270.2 lb

## 2020-08-21 DIAGNOSIS — E119 Type 2 diabetes mellitus without complications: Secondary | ICD-10-CM

## 2020-08-21 DIAGNOSIS — Z0100 Encounter for examination of eyes and vision without abnormal findings: Secondary | ICD-10-CM

## 2020-08-21 DIAGNOSIS — I10 Essential (primary) hypertension: Secondary | ICD-10-CM | POA: Diagnosis not present

## 2020-08-21 DIAGNOSIS — R011 Cardiac murmur, unspecified: Secondary | ICD-10-CM

## 2020-08-21 NOTE — Progress Notes (Signed)
Subjective:    Patient ID: Ronald King, male    DOB: Dec 30, 1945, 75 y.o.   MRN: [id number]  DOS:  08/21/2020 Type of visit - description: Routine visit In general he feels well. Has no concerns. On today exam I noticed mild heart murmur. Denies substernal chest pain. Breathing is at baseline. No edema, no palpitations.  Review of Systems See above   Past Medical History:  Diagnosis Date  . Allergy   . Anemia   . Anxiety   . Blood transfusion without reported diagnosis   . Chronic bronchitis   . Chronic cough   . Diabetes ([place]) 07-2011   per Dr Buddy Duty  . DJD (degenerative joint disease)   . Dyslipidemia   . Esophageal polyp   . GERD (gastroesophageal reflux disease)    s/p [employer] F.  . Hemorrhoids, internal   . History of gastrointestinal hemorrhage   . Hypertension   . Hypogonadism, male    per Dr Buddy Duty  . Neuromuscular disorder ([facility])    neuropathy feet  . Neuropathy    per neuro-Dr Ferrarou vicodin  . OSA (obstructive sleep apnea)    on CPAP  . Sleep apnea    CPAP at night  . Vitamin D deficiency     Past Surgical History:  Procedure Laterality Date  . ANAL FISSURE REPAIR    . hand fracture surgery     left  . KNEE ARTHROSCOPY  2007   Dr. Marlou Sa  . LAPAROSCOPIC NISSEN FUNDOPLICATION  08/3783   Dr. Hassell Done  . NOSE SURGERY    . TONSILLECTOMY    . UPPER GASTROINTESTINAL ENDOSCOPY      Allergies as of 08/21/2020      Reactions   Montelukast Shortness Of Breath   Ceftin [cefuroxime Axetil]    Cymbalta [duloxetine Hcl]    Duloxetine    Other reaction(s): Other (See Comments) refuses   Lyrica [pregabalin] Other (See Comments)   Drowsiness   Neurontin [gabapentin] Other (See Comments)   Drowsiness       Medication List       Accurate as of Aug 21, 2020  8:36 PM. If you have any questions, ask your nurse or doctor.        amLODipine 10 MG tablet Commonly known as: NORVASC Take 1 tablet (10 mg total) by mouth daily.   amoxicillin 500 MG  tablet Commonly known as: AMOXIL Take 1 tablet (500 mg total) by mouth 3 (three) times daily. As needed for bronchitis flares   ascorbic acid 500 MG tablet Commonly known as: VITAMIN C Take 1,000 mg by mouth daily.   aspirin 81 MG tablet Take 81 mg by mouth daily.   B-12 2000 MCG Tabs Take 1 tablet by mouth daily.   Calcium Carbonate-Vitamin D 600-400 MG-UNIT tablet Take 1 tablet by mouth daily.   CENTRUM SILVER PO Take 1 tablet by mouth daily. Reported on 03/17/2015   clonazePAM 1 MG tablet Commonly known as: KLONOPIN Take 1 mg by mouth 2 (two) times daily.   CO Q 10 PO Take 1 tablet by mouth daily.   fenofibrate 145 MG tablet Commonly known as: TRICOR Take 1 tablet (145 mg total) by mouth daily.   HYDROcodone-homatropine 5-1.5 MG/5ML syrup Commonly known as: HYCODAN Take 5 mLs by mouth every 6 (six) hours as needed for cough.   lansoprazole 30 MG capsule Commonly known as: PREVACID TAKE 1 CAPSULE(30 MG) BY MOUTH TWICE DAILY   PARoxetine 20 MG tablet Commonly known as:  PAXIL Take 20 mg by mouth daily.   pyridOXINE 100 MG tablet Commonly known as: VITAMIN B-6 Take 100 mg by mouth daily.   rosuvastatin 40 MG tablet Commonly known as: Crestor Take 1 tablet (40 mg total) by mouth at bedtime.   sucralfate 1 g tablet Commonly known as: CARAFATE TAKE 1 TABLET(1 GRAM) BY MOUTH TWICE DAILY What changed: See the new instructions.   Trulicity 5.05 LZ/7.6BH Sopn Generic drug: Dulaglutide Inject 0.75 mg into the skin once a week.          Objective:   Physical Exam BP 136/74 (BP Location: Left Arm, Patient Position: Sitting, Cuff Size: Normal)   Pulse 71   Temp 98.4 F (36.9 C) (Oral)   Resp 16   Ht 6\' 4"  (1.93 m)   Wt 270 lb 4 oz (122.6 kg)   SpO2 97%   BMI 32.90 kg/m  General:   Well developed, NAD, BMI noted. HEENT:  Normocephalic . Face symmetric, atraumatic Lungs:  CTA B Normal respiratory effort, no intercostal retractions, no accessory  muscle use. Heart: RRR with occasional irregular beat, soft systolic murmur.  Lower extremities: no pretibial edema bilaterally  Skin: Not pale. Not jaundice Neurologic:  alert & oriented X3.  Speech normal, gait appropriate for age and unassisted Psych--  Cognition and judgment appear intact.  Cooperative with normal attention span and concentration.  Behavior appropriate. No anxious or depressed appearing.      Assessment      Assessment  ENDO: Dr Buddy Duty -DM - hypogonadism  HTN Dislipidemia NEURO: see note 08/08/2017 --+ Neuropathy  --RLS type of symptoms  Obesity, Anxiety -- paxil- clonazepam (per Dr Tye Savoy ) Vitamin D deficiency PULM:  --OSA, CPAP --Chronic cough  DJD, Dr. Marlou Sa GU: Dr Erenest Rasher BPH, ED   GI: --GERD, status post Nissen fundoplication --Esophageal polyp -- h/o GI bleed- tranfussion (90s)  PLAN:  DM: Per Endo, on trulicity HTN: BP is very good here at home.  On amlodipine.  Check a CMP. Heart murmur: Soft heart systolic murmur.  New issue, no symptoms.  Occasionally has a skipped heartbeat. EKG today: NSR, occasional PAC.  No acute changes Plan: Echo, further advised with results COPD, OSA , chronic cough: saw pulm 05/2020 GERD: saw GI , next OV 04/2021, sxs controlled Preventive care: Recommend COVID-vaccine #4 RTC 10-2019 CPX  This visit occurred during the SARS-CoV-2 public health emergency.  Safety protocols were in place, including screening questions prior to the visit, additional usage of staff PPE, and extensive cleaning of exam room while observing appropriate contact time as indicated for disinfecting solutions.

## 2020-08-21 NOTE — Assessment & Plan Note (Signed)
DM: Per Endo, on trulicity HTN: BP is very good here at home.  On amlodipine.  Check a CMP. Heart murmur: Soft heart systolic murmur.  New issue, no symptoms.  Occasionally has a skipped heartbeat. EKG today: NSR, occasional PAC.  No acute changes Plan: Echo, further advised with results COPD, OSA , chronic cough: saw pulm 05/2020 GERD: saw GI , next OV 04/2021, sxs controlled Preventive care: Recommend COVID-vaccine #4 RTC 10-2019 CPX

## 2020-08-21 NOTE — Patient Instructions (Addendum)
Per our records your last diabetic eye exam was in 2020- it is important to have these done yearly to protect your vision. We have placed a referral back to [facility] in [facility]. Please expect a call to schedule a visit at your convenience.   Check the  blood pressure twice a month BP GOAL is between 110/65 and  135/85. If it is consistently higher or lower, let me know  Will order echocardiogram  Please proceed with your COVID vaccination #4  GO TO THE LAB : Get the blood work     [place], [place] back for a physical exam in 6 months

## 2020-09-01 ENCOUNTER — Telehealth: Payer: Self-pay | Admitting: Pulmonary Disease

## 2020-09-01 MED ORDER — AZITHROMYCIN 250 MG PO TABS: ORAL_TABLET | ORAL | 0 refills | Status: AC

## 2020-09-01 NOTE — Telephone Encounter (Signed)
Has cough, chest congestion.  Has tried amoxicillin like he usually does, but didn't work this time.  He did home COVID test several times and consistently negative.  Will send script for zpak. Advised him to call back by end of week if he is not feeling better. At that point he would need an ROV and probably need a chest xray.

## 2020-09-05 ENCOUNTER — Ambulatory Visit: Payer: Medicare PPO | Admitting: Internal Medicine

## 2020-10-08 DIAGNOSIS — R053 Chronic cough: Secondary | ICD-10-CM

## 2020-10-08 NOTE — Telephone Encounter (Signed)
Received the following message from patient:   "Hi Dr Halford Chessman ,  We discussed earlier if I had a request or question to contact you via [place].    Because of the extreme  Bronchitis  that I had and the chronic cough that seemed to never quit ;  I used  the Amoxicillun 500 MG and when It got worse  you advised me to use Z-Pac.  I have no cough medicine left now . My last refill was 06/10/20. My Amox is getting low.  Can you refill these  for me please. [facility] is [facility] on [address]. [phone].    I am leaving for a week  starting Sunday starting  10-12-20.  Pharmacy is closed on week-ends.  Thank you for your consideration in this matter.   Ronald King [phone]"  Dr. Halford Chessman, can you please advise? Thanks.

## 2020-10-09 MED ORDER — HYDROCODONE BIT-HOMATROP MBR 5-1.5 MG/5ML PO SOLN: 5.0000 mL | Freq: Four times a day (QID) | ORAL | 0 refills | Status: DC | PRN

## 2020-10-09 MED ORDER — AMOXICILLIN 500 MG PO TABS: 500.0000 mg | ORAL_TABLET | Freq: Three times a day (TID) | ORAL | 1 refills | Status: DC

## 2020-10-09 NOTE — Telephone Encounter (Signed)
Please let him know refills have been sent for amoxicillin and hycodan.

## 2020-10-16 ENCOUNTER — Other Ambulatory Visit: Payer: Self-pay | Admitting: Internal Medicine

## 2020-10-23 DIAGNOSIS — H1010 Acute atopic conjunctivitis, unspecified eye: Secondary | ICD-10-CM | POA: Diagnosis not present

## 2020-10-23 DIAGNOSIS — H04123 Dry eye syndrome of bilateral lacrimal glands: Secondary | ICD-10-CM | POA: Diagnosis not present

## 2020-10-23 DIAGNOSIS — J3089 Other allergic rhinitis: Secondary | ICD-10-CM | POA: Diagnosis not present

## 2020-10-23 DIAGNOSIS — E119 Type 2 diabetes mellitus without complications: Secondary | ICD-10-CM | POA: Diagnosis not present

## 2020-10-23 LAB — HM DIABETES EYE EXAM

## 2020-10-27 ENCOUNTER — Encounter: Payer: Self-pay | Admitting: Internal Medicine

## 2020-12-29 DIAGNOSIS — L538 Other specified erythematous conditions: Secondary | ICD-10-CM | POA: Diagnosis not present

## 2020-12-29 DIAGNOSIS — L821 Other seborrheic keratosis: Secondary | ICD-10-CM | POA: Diagnosis not present

## 2020-12-29 DIAGNOSIS — C44519 Basal cell carcinoma of skin of other part of trunk: Secondary | ICD-10-CM | POA: Diagnosis not present

## 2020-12-29 DIAGNOSIS — L820 Inflamed seborrheic keratosis: Secondary | ICD-10-CM | POA: Diagnosis not present

## 2020-12-29 DIAGNOSIS — L814 Other melanin hyperpigmentation: Secondary | ICD-10-CM | POA: Diagnosis not present

## 2020-12-29 DIAGNOSIS — D485 Neoplasm of uncertain behavior of skin: Secondary | ICD-10-CM | POA: Diagnosis not present

## 2020-12-29 DIAGNOSIS — L298 Other pruritus: Secondary | ICD-10-CM | POA: Diagnosis not present

## 2020-12-29 DIAGNOSIS — D224 Melanocytic nevi of scalp and neck: Secondary | ICD-10-CM | POA: Diagnosis not present

## 2020-12-29 DIAGNOSIS — R208 Other disturbances of skin sensation: Secondary | ICD-10-CM | POA: Diagnosis not present

## 2021-01-06 ENCOUNTER — Other Ambulatory Visit: Payer: Self-pay

## 2021-01-07 ENCOUNTER — Ambulatory Visit (INDEPENDENT_AMBULATORY_CARE_PROVIDER_SITE_OTHER): Payer: Medicare PPO | Admitting: Internal Medicine

## 2021-01-07 ENCOUNTER — Encounter: Payer: Self-pay | Admitting: Internal Medicine

## 2021-01-07 ENCOUNTER — Other Ambulatory Visit: Payer: Self-pay

## 2021-01-07 VITALS — BP 134/74 | HR 70 | Temp 98.4°F | Resp 18 | Ht 76.0 in | Wt 267.2 lb

## 2021-01-07 DIAGNOSIS — I10 Essential (primary) hypertension: Secondary | ICD-10-CM

## 2021-01-07 DIAGNOSIS — R011 Cardiac murmur, unspecified: Secondary | ICD-10-CM | POA: Diagnosis not present

## 2021-01-07 DIAGNOSIS — Z0000 Encounter for general adult medical examination without abnormal findings: Secondary | ICD-10-CM | POA: Diagnosis not present

## 2021-01-07 DIAGNOSIS — E785 Hyperlipidemia, unspecified: Secondary | ICD-10-CM

## 2021-01-07 DIAGNOSIS — Z23 Encounter for immunization: Secondary | ICD-10-CM

## 2021-01-07 LAB — CBC WITH DIFFERENTIAL/PLATELET
Basophils Absolute: 0 10*3/uL (ref 0.0–0.1)
Basophils Relative: 0.6 % (ref 0.0–3.0)
Eosinophils Absolute: 0.2 10*3/uL (ref 0.0–0.7)
Eosinophils Relative: 2.5 % (ref 0.0–5.0)
HCT: 41.9 % (ref 39.0–52.0)
Hemoglobin: 14.3 g/dL (ref 13.0–17.0)
Lymphocytes Relative: 17.2 % (ref 12.0–46.0)
Lymphs Abs: 1.1 10*3/uL (ref 0.7–4.0)
MCHC: 34.1 g/dL (ref 30.0–36.0)
MCV: 90.6 fl (ref 78.0–100.0)
Monocytes Absolute: 0.5 10*3/uL (ref 0.1–1.0)
Monocytes Relative: 7.7 % (ref 3.0–12.0)
Neutro Abs: 4.6 10*3/uL (ref 1.4–7.7)
Neutrophils Relative %: 72 % (ref 43.0–77.0)
Platelets: 231 10*3/uL (ref 150.0–400.0)
RBC: 4.63 Mil/uL (ref 4.22–5.81)
RDW: 13.3 % (ref 11.5–15.5)
WBC: 6.4 10*3/uL (ref 4.0–10.5)

## 2021-01-07 LAB — COMPREHENSIVE METABOLIC PANEL
ALT: 18 U/L (ref 0–53)
AST: 20 U/L (ref 0–37)
Albumin: 4.4 g/dL (ref 3.5–5.2)
Alkaline Phosphatase: 46 U/L (ref 39–117)
BUN: 15 mg/dL (ref 6–23)
CO2: 32 mEq/L (ref 19–32)
Calcium: 9.4 mg/dL (ref 8.4–10.5)
Chloride: 105 mEq/L (ref 96–112)
Creatinine, Ser: 1.13 mg/dL (ref 0.40–1.50)
GFR: 64.11 mL/min (ref >60.00)
Glucose, Bld: 99 mg/dL (ref 70–99)
Potassium: 4.7 mEq/L (ref 3.5–5.1)
Sodium: 141 mEq/L (ref 135–145)
Total Bilirubin: 0.6 mg/dL (ref 0.2–1.2)
Total Protein: 6.5 g/dL (ref 6.0–8.3)

## 2021-01-07 LAB — LIPID PANEL
Cholesterol: 143 mg/dL (ref 0–200)
HDL: 40.1 mg/dL (ref >39.00)
LDL Cholesterol: 83 mg/dL (ref 0–99)
NonHDL: 102.43
Total CHOL/HDL Ratio: 4
Triglycerides: 97 mg/dL (ref 0.0–149.0)
VLDL: 19.4 mg/dL (ref 0.0–40.0)

## 2021-01-07 LAB — PSA: PSA: 5 ng/mL — ABNORMAL HIGH (ref 0.10–4.00)

## 2021-01-07 NOTE — Assessment & Plan Note (Signed)
--  Td 2017 - PNM 23:  2013;  prevnar 2015 - s/p zostavax and s/p  shingrex - s/p pfizer C-19, booster rec, declined  -  Flu shot today --CCS: Cscope 08-2008, neg, Cscope 10/2018, next per GI --h/o elevated PSA: last PSA January 2021: 3.2 (K PN). Has not seen urology in a while, will assume care.  DRE today  okay, check a PSA (used to be on testosterone supplements until 2018) --rec to stay active and eat healthy.   --Labs: CMP, FLP, CBC, PSA -- POA d/w pt

## 2021-01-07 NOTE — Progress Notes (Signed)
Subjective:    Patient ID: Ronald King, male    DOB: Mar 29, 1946, 75 y.o.   MRN: [id number]  DOS:  01/07/2021 Type of visit - description: CPX  Since the last office visit is doing well, has no new symptoms. Still has cough on and off, some urinary frequency without other LUTS.    Wt Readings from Last 3 Encounters:  01/07/21 267 lb 4 oz (121.2 kg)  08/21/20 270 lb 4 oz (122.6 kg)  06/10/20 272 lb 9.6 oz (123.7 kg)   BP Readings from Last 3 Encounters:  01/07/21 134/74  08/21/20 136/74  06/10/20 130/86     Review of Systems  Other than above, a 14 point review of systems is negative       Past Medical History:  Diagnosis Date   Allergy    Anemia    Anxiety    Blood transfusion without reported diagnosis    Chronic bronchitis    Chronic cough    Diabetes ([place]) 07-2011   per Dr Buddy Duty   DJD (degenerative joint disease)    Dyslipidemia    Esophageal polyp    GERD (gastroesophageal reflux disease)    s/p [employer] F.   Hemorrhoids, internal    History of gastrointestinal hemorrhage    Hypertension    Hypogonadism, male    per Dr Buddy Duty   Neuromuscular disorder [facility])    neuropathy feet   Neuropathy    per neuro-Dr Joselyn Glassman vicodin   OSA (obstructive sleep apnea)    on CPAP   Sleep apnea    CPAP at night   Vitamin D deficiency     Past Surgical History:  Procedure Laterality Date   ANAL FISSURE REPAIR     hand fracture surgery     left   KNEE ARTHROSCOPY  2007   Dr. Marlou Sa   LAPAROSCOPIC NISSEN FUNDOPLICATION  06/1738   Dr. Hassell Done   NOSE SURGERY     TONSILLECTOMY     UPPER GASTROINTESTINAL ENDOSCOPY     Social History   Socioeconomic History   Marital status: Married    Spouse name: Not on file   Number of children: 1   Years of education: Not on file   Highest education level: Not on file  Occupational History   Occupation: Technical brewer, retired    Comment: retired   Occupation: minister    Comment: [employer], [employer]   Tobacco Use   Smoking status: Former    Packs/day: 1.00    Years: 3.00    Pack years: 3.00    Types: Cigarettes    Quit date: 03/29/1965    Years since quitting: 55.8   Smokeless tobacco: Never  Vaping Use   Vaping Use: Never used  Substance and Sexual Activity   Alcohol use: No   Drug use: No   Sexual activity: Not on file  Other Topics Concern   Not on file  Social History Narrative   Lives w/ wife   Social Determinants of Health   Financial Resource Strain: Not on file  Food Insecurity: Not on file  Transportation Needs: Not on file  Physical Activity: Not on file  Stress: Not on file  Social Connections: Not on file  Intimate Partner Violence: Not on file    Allergies as of 01/07/2021       Reactions   Montelukast Shortness Of Breath   Ceftin [cefuroxime Axetil]    Cymbalta [duloxetine Hcl]    Duloxetine  Other reaction(s): Other (See Comments) refuses   Lyrica [pregabalin] Other (See Comments)   Drowsiness   Neurontin [gabapentin] Other (See Comments)   Drowsiness         Medication List        Accurate as of January 07, 2021  6:17 PM. If you have any questions, ask your nurse or doctor.          STOP taking these medications    aspirin 81 MG tablet Stopped by: Kathlene November, MD       TAKE these medications    amLODipine 10 MG tablet Commonly known as: NORVASC Take 1 tablet (10 mg total) by mouth daily.   amoxicillin 500 MG tablet Commonly known as: AMOXIL Take 1 tablet (500 mg total) by mouth 3 (three) times daily. As needed for bronchitis flares   ascorbic acid 500 MG tablet Commonly known as: VITAMIN C Take 1,000 mg by mouth daily.   B-12 2000 MCG Tabs Take 1 tablet by mouth daily.   Calcium Carbonate-Vitamin D 600-400 MG-UNIT tablet Take 1 tablet by mouth daily.   CENTRUM SILVER PO Take 1 tablet by mouth daily. Reported on 03/17/2015   clonazePAM 1 MG tablet Commonly known as: KLONOPIN Take 1 mg by mouth 2 (two) times  daily.   CO Q 10 PO Take 1 tablet by mouth daily.   fenofibrate 145 MG tablet Commonly known as: TRICOR Take 1 tablet (145 mg total) by mouth daily.   HYDROcodone bit-homatropine 5-1.5 MG/5ML syrup Commonly known as: HYCODAN Take 5 mLs by mouth every 6 (six) hours as needed for cough.   lansoprazole 30 MG capsule Commonly known as: PREVACID TAKE 1 CAPSULE(30 MG) BY MOUTH TWICE DAILY   PARoxetine 20 MG tablet Commonly known as: PAXIL Take 20 mg by mouth daily.   pyridOXINE 100 MG tablet Commonly known as: VITAMIN B-6 Take 100 mg by mouth daily.   rosuvastatin 40 MG tablet Commonly known as: Crestor Take 1 tablet (40 mg total) by mouth at bedtime.   sucralfate 1 g tablet Commonly known as: CARAFATE TAKE 1 TABLET(1 GRAM) BY MOUTH TWICE DAILY What changed: See the new instructions.   Trulicity 1.96 QI/2.9NL Sopn Generic drug: Dulaglutide Inject 0.75 mg into the skin once a week.           Objective:   Physical Exam BP 134/74 (BP Location: Left Arm, Patient Position: Sitting, Cuff Size: Normal)   Pulse 70   Temp 98.4 F (36.9 C) (Oral)   Resp 18   Ht 6\' 4"  (1.93 m)   Wt 267 lb 4 oz (121.2 kg)   SpO2 98%   BMI 32.53 kg/m  General: Well developed, NAD, BMI noted Neck: No  thyromegaly  HEENT:  Normocephalic . Face symmetric, atraumatic Lungs:  CTA B Normal respiratory effort, no intercostal retractions, no accessory muscle use. Heart: RRR, soft systolic murmur noted Abdomen:  Not distended, soft, non-tender. No rebound or rigidity.   Lower extremities: no pretibial edema bilaterally DRE: No external abnormalities, no stools, sphincter tone normal, prostate difficult to reach but seems normal. Skin: Exposed areas without rash. Not pale. Not jaundice Neurologic:  alert & oriented X3.  Speech normal, gait appropriate for age and unassisted Strength symmetric and appropriate for age.  Psych: Cognition and judgment appear intact.  Cooperative with normal  attention span and concentration.  Behavior appropriate. No anxious or depressed appearing.     Assessment     Assessment  ENDO: Dr Buddy Duty -DM -  hypogonadism  HTN Dislipidemia NEURO: see note 08/08/2017 --+ Neuropathy  --RLS type of symptoms  Obesity, Anxiety -- paxil- clonazepam (per Dr Tye Savoy ) Vitamin D deficiency PULM:  --OSA, CPAP --Chronic cough  DJD, Dr. Marlou Sa GU: Dr Erenest Rasher BPH, ED   GI: --GERD, status post Nissen fundoplication --Esophageal polyp -- h/o GI bleed- tranfussion (90s)  PLAN: Here for CPX DM: Per Endo Hypogonadism: Off HRT ( d/t increased PSA )  since approximately 2018 . HTN:  stopped checking amb b/p b/c  is "always okay".  Continue amlodipine, check labs Dyslipidemia: On Crestor and fenofibrate.  Labs. Vitamin D deficiency: Last level okay, recommend OTCs Heart murmur: See last visit, echo not pursue, will try again COPD, OSA, chronic cough: Sees pulmonary. BPH, ED, has not seen urology lately  RTC 6 months    This visit occurred during the SARS-CoV-2 public health emergency.  Safety protocols were in place, including screening questions prior to the visit, additional usage of staff PPE, and extensive cleaning of exam room while observing appropriate contact time as indicated for disinfecting solutions.

## 2021-01-07 NOTE — Patient Instructions (Signed)
We are referring you for echocardiogram, if you do not hear from them in 1 week let us know   GO TO THE LAB : Get the blood work     [place], [place] back for a checkup in 6 months    "Living will", "[place] of attorney": Advanced care planning  (If you already have a living will or healthcare power of attorney, please bring the copy to be scanned in your chart.)  Advance care planning is a process that supports adults in  understanding and sharing their preferences regarding future medical care.   The patient's preferences are recorded in documents called Advance Directives.    Advanced directives are completed (and can be modified at any time) while the patient is in full mental capacity.   The documentation should be available at all times to the patient, the family and the healthcare providers.  Bring in a copy to be scanned in your chart is an excellent idea and is recommended   This legal documents direct treatment decision making and/or appoint a surrogate to make the decision if the patient is not capable to do so.    Advance directives can be documented in many types of formats,  documents have names such as:  Lliving will  Durable power of attorney for healthcare (healthcare proxy or healthcare power of attorney)  Combined directives  Physician orders for life-sustaining treatment    More information at:  [email or web address]

## 2021-01-07 NOTE — Assessment & Plan Note (Signed)
Here for CPX DM: Per Endo Hypogonadism: Off HRT ( d/t increased PSA )  since approximately 2018 . HTN:  stopped checking amb b/p b/c  is "always okay".  Continue amlodipine, check labs Dyslipidemia: On Crestor and fenofibrate.  Labs. Vitamin D deficiency: Last level okay, recommend OTCs Heart murmur: See last visit, echo not pursue, will try again COPD, OSA, chronic cough: Sees pulmonary. BPH, ED, has not seen urology lately  RTC 6 months

## 2021-01-12 ENCOUNTER — Telehealth: Payer: Self-pay | Admitting: Internal Medicine

## 2021-01-12 NOTE — Telephone Encounter (Signed)
Pt stated he received PSA test results.Pt  contacted Urology and they informed him the first appointment wouldn't be until December. Pt will like to know if meds can be prescribed before hand. [phone]. Please advise.

## 2021-01-15 ENCOUNTER — Encounter: Payer: Self-pay | Admitting: Internal Medicine

## 2021-01-15 DIAGNOSIS — R011 Cardiac murmur, unspecified: Secondary | ICD-10-CM | POA: Diagnosis not present

## 2021-01-15 DIAGNOSIS — E230 Hypopituitarism: Secondary | ICD-10-CM | POA: Diagnosis not present

## 2021-01-15 DIAGNOSIS — Z85828 Personal history of other malignant neoplasm of skin: Secondary | ICD-10-CM | POA: Diagnosis not present

## 2021-01-15 DIAGNOSIS — R972 Elevated prostate specific antigen [PSA]: Secondary | ICD-10-CM | POA: Diagnosis not present

## 2021-01-15 DIAGNOSIS — E119 Type 2 diabetes mellitus without complications: Secondary | ICD-10-CM | POA: Diagnosis not present

## 2021-01-15 DIAGNOSIS — L820 Inflamed seborrheic keratosis: Secondary | ICD-10-CM | POA: Diagnosis not present

## 2021-01-15 DIAGNOSIS — Z08 Encounter for follow-up examination after completed treatment for malignant neoplasm: Secondary | ICD-10-CM | POA: Diagnosis not present

## 2021-01-15 DIAGNOSIS — L538 Other specified erythematous conditions: Secondary | ICD-10-CM | POA: Diagnosis not present

## 2021-01-15 LAB — HEMOGLOBIN A1C: Hemoglobin A1C: 6.5

## 2021-01-16 ENCOUNTER — Encounter: Payer: Self-pay | Admitting: Pulmonary Disease

## 2021-01-16 ENCOUNTER — Ambulatory Visit (INDEPENDENT_AMBULATORY_CARE_PROVIDER_SITE_OTHER): Payer: Medicare PPO

## 2021-01-16 ENCOUNTER — Other Ambulatory Visit: Payer: Self-pay

## 2021-01-16 ENCOUNTER — Ambulatory Visit: Payer: Medicare PPO | Admitting: Pulmonary Disease

## 2021-01-16 VITALS — BP 136/70 | HR 70 | Temp 99.0°F | Ht 73.0 in | Wt 265.0 lb

## 2021-01-16 DIAGNOSIS — K219 Gastro-esophageal reflux disease without esophagitis: Secondary | ICD-10-CM | POA: Diagnosis not present

## 2021-01-16 DIAGNOSIS — R058 Other specified cough: Secondary | ICD-10-CM

## 2021-01-16 DIAGNOSIS — J411 Mucopurulent chronic bronchitis: Secondary | ICD-10-CM | POA: Diagnosis not present

## 2021-01-16 DIAGNOSIS — G4733 Obstructive sleep apnea (adult) (pediatric): Secondary | ICD-10-CM | POA: Diagnosis not present

## 2021-01-16 DIAGNOSIS — R053 Chronic cough: Secondary | ICD-10-CM | POA: Diagnosis not present

## 2021-01-16 MED ORDER — HYDROCODONE BIT-HOMATROP MBR 5-1.5 MG/5ML PO SOLN: 5.0000 mL | Freq: Four times a day (QID) | ORAL | 0 refills | Status: DC | PRN

## 2021-01-16 MED ORDER — AMOXICILLIN 500 MG PO TABS: 500.0000 mg | ORAL_TABLET | Freq: Three times a day (TID) | ORAL | 1 refills | Status: DC

## 2021-01-16 NOTE — Progress Notes (Addendum)
[facility]  Chief Complaint  Patient presents with   Follow-up    Bronchitis     Constitutional:  BP 136/70 (BP Location: Right Arm, Cuff Size: Normal)   Pulse 70   Temp 99 F (37.2 C) (Oral)   Ht 6\' 1"  (1.854 m)   Wt 265 lb (120.2 kg)   SpO2 97%   BMI 34.96 kg/m   Past Medical History:  HTN, HLD, GERD, IBS, hemorrhoids, BPH, ED, DJD, HA, Vit D deficiency, Neuropathy, Anxiety  Past Surgical History:  He  has a past surgical history that includes Laparoscopic Nissen fundoplication (08/2227); Knee arthroscopy (2007); Tonsillectomy; Nose surgery; Anal fissure repair; hand fracture surgery; and Upper gastrointestinal endoscopy.  Brief Summary:  Ronald King is a 75 y.o. male former smoker with cough and obstructive sleep apnea.  Former patient of Dr. Lenna Gilford.      Subjective:   He had a bad bout of bronchitis over the Summer.  Amoxicillin wasn't sufficient, and he needed a zpak.    His cough is better now.  Not having chest pain.  Was found to have elevated PSA and new heart murmur.  Using CPAP nightly.  No issue with mask fit or pressure setting.   Physical Exam:   Appearance - well kempt   ENMT - no sinus tenderness, no oral exudate, no LAN, Mallampati 3 airway, no stridor  Respiratory - equal breath sounds bilaterally, no wheezing or rales  CV - s1s2 regular rate and rhythm, 2/6 SM  Ext - no clubbing, no edema  Skin - no rashes  Psych - normal mood and affect    Pulmonary testing:  Spirometry September 2013 >> FEV 1 3.46 (86%), FEV1% 87 RAST 11/15/18 >> grasses, IgE 87 PFT 12/25/18 >> FEV1 3.13 (91%), FEV1% 86, TLC 6.14 (82%), DLCO 103%, +BD from FEF 25-75  Sleep Tests:  PSG 09/02/97 >> AHI 3 CPAP 05/10/20 to 06/08/20 >> used on 30 of 30 nights with average 8 hrs 46 min.  Average AHI 10.1 with CPAP 16 cm H2O  Social History:  He  reports that he quit smoking about 55 years ago. His smoking use included  cigarettes. He has a 3.00 pack-year smoking history. He has never used smokeless tobacco. He reports that he does not drink alcohol and does not use drugs.  Family History:  His family history includes Alcohol abuse in his father; CAD in his brother; Cerebral aneurysm in his father; Diabetes in his maternal uncle; Heart disease in his mother; Migraines in his brother; Neuropathy in his mother.     Assessment/Plan:   Chronic cough. 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 - previously seen by Dr. Janace Hoard with ENT - nasal steroids sprays reported to be ineffective for him - prn afrin and nasal irrigation - advised him to limit use of afrin  Sleep apnea, NOS. - he is compliant with CPAP and reports benefit - uses [facility] for his DME - continue CPAP 16 cm H2O   GERD. - followed by Dr. Silverio Decamp with GI  New heart murmur. - Echocardiogram ordered by PCP  Elevated PSA. - he will follow up with urology  Time Spent Involved in Patient Care on Day of Examination:  32 minutes  Follow up:   Patient Instructions  Chest xray today  Will send in refills for amoxicillin and hydrocodone cough syrup  Follow up in 6 months  Medication List:   Allergies as of 01/16/2021       Reactions   Montelukast Shortness Of Breath   Ceftin [cefuroxime Axetil]    Cymbalta [duloxetine Hcl]    Duloxetine    Other reaction(s): Other (See  Comments) refuses   Lyrica [pregabalin] Other (See Comments)   Drowsiness   Neurontin [gabapentin] Other (See Comments)   Drowsiness         Medication List        Accurate as of January 16, 2021 12:49 PM. If you have any questions, ask your nurse or doctor.          amLODipine 10 MG tablet Commonly known as: NORVASC Take 1 tablet (10 mg total) by mouth daily.   amoxicillin 500 MG tablet Commonly known as: AMOXIL Take 1 tablet (500 mg total) by mouth 3 (three) times daily. As needed for bronchitis flares   ascorbic acid 500 MG tablet Commonly known as: VITAMIN C Take 1,000 mg by mouth daily.   B-12 2000 MCG Tabs Take 1 tablet by mouth daily.   Calcium Carbonate-Vitamin D 600-400 MG-UNIT tablet Take 1 tablet by mouth daily.   CENTRUM SILVER PO Take 1 tablet by mouth daily. Reported on 03/17/2015   clonazePAM 1 MG tablet Commonly known as: KLONOPIN Take 1 mg by mouth 2 (two) times daily.   CO Q 10 PO Take 1 tablet by mouth daily.   fenofibrate 145 MG tablet Commonly known as: TRICOR Take 1 tablet (145 mg total) by mouth daily.   HYDROcodone bit-homatropine 5-1.5 MG/5ML syrup Commonly known as: HYCODAN Take 5 mLs by mouth every 6 (six) hours as needed for cough.   lansoprazole 30 MG capsule Commonly known as: PREVACID TAKE 1 CAPSULE(30 MG) BY MOUTH TWICE DAILY   PARoxetine 20 MG tablet Commonly known as: PAXIL Take 20 mg by mouth daily.   pyridOXINE 100 MG tablet Commonly known as: VITAMIN B-6 Take 100 mg by mouth daily.   rosuvastatin 40 MG tablet Commonly known as: Crestor Take 1 tablet (40 mg total) by mouth at bedtime.   sucralfate 1 g tablet Commonly known as: CARAFATE TAKE 1 TABLET(1 GRAM) BY MOUTH TWICE DAILY What changed: See the new instructions.   Trulicity 6.81 EX/5.1ZG Sopn Generic drug: Dulaglutide Inject 0.75 mg into the skin once a week.        Signature:  Chesley Mires, MD [place] Pager - [phone] 01/16/2021, 12:49 PM

## 2021-01-16 NOTE — Patient Instructions (Signed)
Chest xray today  Will send in refills for amoxicillin and hydrocodone cough syrup  Follow up in 6 months

## 2021-01-16 NOTE — Addendum Note (Signed)
Addended by: Chesley Mires on: 01/16/2021 12:49 PM   Modules accepted: Orders

## 2021-01-29 ENCOUNTER — Other Ambulatory Visit: Payer: Self-pay

## 2021-01-29 ENCOUNTER — Ambulatory Visit (HOSPITAL_COMMUNITY): Payer: Medicare PPO | Attending: Cardiovascular Disease

## 2021-01-29 DIAGNOSIS — R011 Cardiac murmur, unspecified: Secondary | ICD-10-CM | POA: Diagnosis not present

## 2021-01-29 LAB — ECHOCARDIOGRAM COMPLETE
AR max vel: 1.38 cm2
AV Area VTI: 1.29 cm2
AV Area mean vel: 1.36 cm2
AV Mean grad: 10 mmHg
AV Peak grad: 18.8 mmHg
Ao pk vel: 2.17 m/s
S' Lateral: 2.3 cm

## 2021-01-29 NOTE — Progress Notes (Unsigned)
Per Doctor's order, I did not administer Definity.

## 2021-01-30 ENCOUNTER — Other Ambulatory Visit: Payer: Self-pay | Admitting: Internal Medicine

## 2021-01-30 ENCOUNTER — Telehealth: Payer: Self-pay

## 2021-01-30 NOTE — Telephone Encounter (Signed)
Comments sent, see results

## 2021-01-30 NOTE — Telephone Encounter (Signed)
Pt called wanted to know results of echo. Pt would like a call

## 2021-03-15 ENCOUNTER — Other Ambulatory Visit: Payer: Self-pay | Admitting: Internal Medicine

## 2021-03-19 DIAGNOSIS — E291 Testicular hypofunction: Secondary | ICD-10-CM | POA: Diagnosis not present

## 2021-03-19 DIAGNOSIS — R972 Elevated prostate specific antigen [PSA]: Secondary | ICD-10-CM | POA: Diagnosis not present

## 2021-03-19 DIAGNOSIS — N410 Acute prostatitis: Secondary | ICD-10-CM | POA: Diagnosis not present

## 2021-04-13 ENCOUNTER — Other Ambulatory Visit: Payer: Self-pay | Admitting: Internal Medicine

## 2021-04-21 DIAGNOSIS — E291 Testicular hypofunction: Secondary | ICD-10-CM | POA: Diagnosis not present

## 2021-04-21 DIAGNOSIS — R972 Elevated prostate specific antigen [PSA]: Secondary | ICD-10-CM | POA: Diagnosis not present

## 2021-04-23 ENCOUNTER — Encounter: Payer: Self-pay | Admitting: Pulmonary Disease

## 2021-04-23 MED ORDER — HYDROCODONE BIT-HOMATROP MBR 5-1.5 MG/5ML PO SOLN: 5.0000 mL | Freq: Four times a day (QID) | ORAL | 0 refills | Status: DC | PRN

## 2021-04-23 NOTE — Telephone Encounter (Signed)
Dr. Halford Chessman, patient is requesting Hycodan refill.  LOV 01/16/21 Last Hycodan refill- 01/16/21 #240, no refills  Message from patient-  Dr Halford Chessman  ,  My  cough medicine needs a refill  and I believe my Amoxcillian  is ok  for now   ,  My drug store is  [facility] [address]  please confirm  this message  thanks  Ronald King

## 2021-04-23 NOTE — Telephone Encounter (Signed)
Refill sent.

## 2021-04-24 LAB — HEMOGLOBIN A1C: Hemoglobin A1C: 6.2

## 2021-04-30 ENCOUNTER — Ambulatory Visit (INDEPENDENT_AMBULATORY_CARE_PROVIDER_SITE_OTHER): Payer: Medicare PPO

## 2021-04-30 VITALS — Ht 76.0 in | Wt 256.0 lb

## 2021-04-30 DIAGNOSIS — Z0000 Encounter for general adult medical examination without abnormal findings: Secondary | ICD-10-CM

## 2021-04-30 NOTE — Patient Instructions (Signed)
Ronald King , Thank you for taking time to complete your Medicare Wellness Visit. I appreciate your ongoing commitment to your health goals. Please review the following plan we discussed and let me know if I can assist you in the future.   Screening recommendations/referrals: Colonoscopy: Completed 11/08/2018-Due 11/07/2021 Recommended yearly ophthalmology/optometry visit for glaucoma screening and checkup Recommended yearly dental visit for hygiene and checkup  Vaccinations: Influenza vaccine: Up to date Pneumococcal vaccine: Up to date Tdap vaccine: Up to date Shingles vaccine: Completed vaccines   Covid-19: Booster available at the pharmacy  Advanced directives: Copy in chart  Conditions/risks identified: See problem list  Next appointment: Follow up in one year for your annual wellness visit.   Preventive Care 25 Years and Older, Male Preventive care refers to lifestyle choices and visits with your health care provider that can promote health and wellness. What does preventive care include? A yearly physical exam. This is also called an annual well check. Dental exams once or twice a year. Routine eye exams. Ask your health care provider how often you should have your eyes checked. Personal lifestyle choices, including: Daily care of your teeth and gums. Regular physical activity. Eating a healthy diet. Avoiding tobacco and drug use. Limiting alcohol use. Practicing safe sex. Taking low doses of aspirin every day. Taking vitamin and mineral supplements as recommended by your health care provider. What happens during an annual well check? The services and screenings done by your health care provider during your annual well check will depend on your age, overall health, lifestyle risk factors, and family history of disease. Counseling  Your health care provider may ask you questions about your: Alcohol use. Tobacco use. Drug use. Emotional well-being. Home and  relationship well-being. Sexual activity. Eating habits. History of falls. Memory and ability to understand (cognition). Work and work Statistician. Screening  You may have the following tests or measurements: Height, weight, and BMI. Blood pressure. Lipid and cholesterol levels. These may be checked every 5 years, or more frequently if you are over 15 years old. Skin check. Lung cancer screening. You may have this screening every year starting at age 73 if you have a 30-pack-year history of smoking and currently smoke or have quit within the past 15 years. Fecal occult blood test (FOBT) of the stool. You may have this test every year starting at age 72. Flexible sigmoidoscopy or colonoscopy. You may have a sigmoidoscopy every 5 years or a colonoscopy every 10 years starting at age 67. Prostate cancer screening. Recommendations will vary depending on your family history and other risks. Hepatitis C blood test. Hepatitis B blood test. Sexually transmitted disease (STD) testing. Diabetes screening. This is done by checking your blood sugar (glucose) after you have not eaten for a while (fasting). You may have this done every 1-3 years. Abdominal aortic aneurysm (AAA) screening. You may need this if you are a current or former smoker. Osteoporosis. You may be screened starting at age 78 if you are at high risk. Talk with your health care provider about your test results, treatment options, and if necessary, the need for more tests. Vaccines  Your health care provider may recommend certain vaccines, such as: Influenza vaccine. This is recommended every year. Tetanus, diphtheria, and acellular pertussis (Tdap, Td) vaccine. You may need a Td booster every 10 years. Zoster vaccine. You may need this after age 74. Pneumococcal 13-valent conjugate (PCV13) vaccine. One dose is recommended after age 77. Pneumococcal polysaccharide (PPSV23) vaccine. One dose is  recommended after age 54. Talk to your  health care provider about which screenings and vaccines you need and how often you need them. This information is not intended to replace advice given to you by your health care provider. Make sure you discuss any questions you have with your health care provider. Document Released: 04/11/2015 Document Revised: 12/03/2015 Document Reviewed: 01/14/2015 Elsevier Interactive Patient Education  2017 [place] Prevention in the Home Falls can cause injuries. They can happen to people of all ages. There are many things you can do to make your home safe and to help prevent falls. What can I do on the outside of my home? Regularly fix the edges of walkways and driveways and fix any cracks. Remove anything that might make you trip as you walk through a door, such as a raised step or threshold. Trim any bushes or trees on the path to your home. Use bright outdoor lighting. Clear any walking paths of anything that might make someone trip, such as rocks or tools. Regularly check to see if handrails are loose or broken. Make sure that both sides of any steps have handrails. Any raised decks and porches should have guardrails on the edges. Have any leaves, snow, or ice cleared regularly. Use sand or salt on walking paths during winter. Clean up any spills in your garage right away. This includes oil or grease spills. What can I do in the bathroom? Use night lights. Install grab bars by the toilet and in the tub and shower. Do not use towel bars as grab bars. Use non-skid mats or decals in the tub or shower. If you need to sit down in the shower, use a plastic, non-slip stool. Keep the floor dry. Clean up any water that spills on the floor as soon as it happens. Remove soap buildup in the tub or shower regularly. Attach bath mats securely with double-sided non-slip rug tape. Do not have throw rugs and other things on the floor that can make you trip. What can I do in the bedroom? Use night  lights. Make sure that you have a light by your bed that is easy to reach. Do not use any sheets or blankets that are too big for your bed. They should not hang down onto the floor. Have a firm chair that has side arms. You can use this for support while you get dressed. Do not have throw rugs and other things on the floor that can make you trip. What can I do in the kitchen? Clean up any spills right away. Avoid walking on wet floors. Keep items that you use a lot in easy-to-reach places. If you need to reach something above you, use a strong step stool that has a grab bar. Keep electrical cords out of the way. Do not use floor polish or wax that makes floors slippery. If you must use wax, use non-skid floor wax. Do not have throw rugs and other things on the floor that can make you trip. What can I do with my stairs? Do not leave any items on the stairs. Make sure that there are handrails on both sides of the stairs and use them. Fix handrails that are broken or loose. Make sure that handrails are as long as the stairways. Check any carpeting to make sure that it is firmly attached to the stairs. Fix any carpet that is loose or worn. Avoid having throw rugs at the top or bottom of the stairs. If  you do have throw rugs, attach them to the floor with carpet tape. Make sure that you have a light switch at the top of the stairs and the bottom of the stairs. If you do not have them, ask someone to add them for you. What else can I do to help prevent falls? Wear shoes that: Do not have high heels. Have rubber bottoms. Are comfortable and fit you well. Are closed at the toe. Do not wear sandals. If you use a stepladder: Make sure that it is fully opened. Do not climb a closed stepladder. Make sure that both sides of the stepladder are locked into place. Ask someone to hold it for you, if possible. Clearly mark and make sure that you can see: Any grab bars or handrails. First and last  steps. Where the edge of each step is. Use tools that help you move around (mobility aids) if they are needed. These include: Canes. Walkers. Scooters. Crutches. Turn on the lights when you go into a dark area. Replace any light bulbs as soon as they burn out. Set up your furniture so you have a clear path. Avoid moving your furniture around. If any of your floors are uneven, fix them. If there are any pets around you, be aware of where they are. Review your medicines with your doctor. Some medicines can make you feel dizzy. This can increase your chance of falling. Ask your doctor what other things that you can do to help prevent falls. This information is not intended to replace advice given to you by your health care provider. Make sure you discuss any questions you have with your health care provider. Document Released: 01/09/2009 Document Revised: 08/21/2015 Document Reviewed: 04/19/2014 Elsevier Interactive Patient Education  2017 [employer].

## 2021-04-30 NOTE — Progress Notes (Addendum)
Subjective:   Ronald King is a 76 y.o. male who presents for Medicare Annual/Subsequent preventive examination.  I connected with Koury today by telephone and verified that I am speaking with the correct person using two identifiers. Location patient: home Location provider: work Persons participating in the virtual visit: patient, Marine scientist.    I discussed the limitations, risks, security and privacy concerns of performing an evaluation and management service by telephone and the availability of in person appointments. I also discussed with the patient that there may be a patient responsible charge related to this service. The patient expressed understanding and verbally consented to this telephonic visit.    Interactive audio and video telecommunications were attempted between this provider and patient, however failed, due to patient having technical difficulties OR patient did not have access to video capability.  We continued and completed visit with audio only.  Some vital signs may be absent or patient reported.   Time Spent with patient on telephone encounter: 35 minutes   Review of Systems     Cardiac Risk Factors include: advanced age (>104men, >74 women);male gender;hypertension;diabetes mellitus;dyslipidemia;obesity (BMI >30kg/m2)     Objective:    Today's Vitals   04/30/21 1141  Weight: 256 lb (116.1 kg)  Height: 6\' 4"  (1.93 m)   Body mass index is 31.16 kg/m.  Advanced Directives 04/30/2021 12/14/2018 08/08/2017 02/23/2017 09/24/2015 05/26/2015  Does Patient Have a Medical Advance Directive? Yes Yes Yes Yes Yes Yes  Type of Paramedic of [place];Living will [place];Living will [place];Living will [place];Living will [place];Living will [place];Living will  Does patient want to make changes to medical advance directive? - No - Patient  declined No - Patient declined - - -  Copy of [place] in Chart? Yes - validated most recent copy scanned in chart (See row information) Yes - validated most recent copy scanned in chart (See row information) No - copy requested - No - copy requested -    Current Medications (verified) Outpatient Encounter Medications as of 04/30/2021  Medication Sig   amLODipine (NORVASC) 10 MG tablet TAKE 1 TABLET(10 MG) BY MOUTH DAILY   amoxicillin (AMOXIL) 500 MG tablet Take 1 tablet (500 mg total) by mouth 3 (three) times daily. As needed for bronchitis flares   ascorbic acid (VITAMIN C) 500 MG tablet Take 1,000 mg by mouth daily.   Calcium Carbonate-Vitamin D 600-400 MG-UNIT tablet Take 1 tablet by mouth daily.   clonazePAM (KLONOPIN) 1 MG tablet Take 1 mg by mouth 2 (two) times daily.   Coenzyme Q10 (CO Q 10 PO) Take 1 tablet by mouth daily.    Cyanocobalamin (B-12) 2000 MCG TABS Take 1 tablet by mouth daily.   Dulaglutide (TRULICITY) 8.67 YP/9.5KD SOPN Inject 0.75 mg into the skin once a week.   fenofibrate (TRICOR) 145 MG tablet TAKE 1 TABLET(145 MG) BY MOUTH DAILY   HYDROcodone bit-homatropine (HYCODAN) 5-1.5 MG/5ML syrup Take 5 mLs by mouth every 6 (six) hours as needed for cough.   lansoprazole (PREVACID) 30 MG capsule TAKE 1 CAPSULE(30 MG) BY MOUTH TWICE DAILY   Multiple Vitamins-Minerals (CENTRUM SILVER PO) Take 1 tablet by mouth daily. Reported on 03/17/2015   PARoxetine (PAXIL) 20 MG tablet Take 20 mg by mouth daily.   pyridOXINE (VITAMIN B-6) 100 MG tablet Take 100 mg by mouth daily.   rosuvastatin (CRESTOR) 40 MG tablet TAKE 1 TABLET(40 MG)  BY MOUTH AT BEDTIME   sucralfate (CARAFATE) 1 g tablet TAKE 1 TABLET(1 GRAM) BY MOUTH TWICE DAILY (Patient taking differently: as needed.)   No facility-administered encounter medications on file as of 04/30/2021.    Allergies (verified) Montelukast, Ceftin [cefuroxime axetil], Cymbalta [duloxetine hcl], Duloxetine, Lyrica  [pregabalin], and Neurontin [gabapentin]   History: Past Medical History:  Diagnosis Date   Allergy    Anemia    Anxiety    Blood transfusion without reported diagnosis    Chronic bronchitis    Chronic cough    Diabetes ([place]) 07-2011   per Dr Buddy Duty   DJD (degenerative joint disease)    Dyslipidemia    Esophageal polyp    GERD (gastroesophageal reflux disease)    s/p [employer] F.   Hemorrhoids, internal    History of gastrointestinal hemorrhage    Hypertension    Hypogonadism, male    per Dr Buddy Duty   Neuromuscular disorder [facility])    neuropathy feet   Neuropathy    per neuro-Dr Joselyn Glassman vicodin   OSA (obstructive sleep apnea)    on CPAP   Sleep apnea    CPAP at night   Vitamin D deficiency    Past Surgical History:  Procedure Laterality Date   ANAL FISSURE REPAIR     hand fracture surgery     left   KNEE ARTHROSCOPY  2007   Dr. Marlou Sa   LAPAROSCOPIC NISSEN FUNDOPLICATION  11/7987   Dr. Hassell Done   NOSE SURGERY     TONSILLECTOMY     UPPER GASTROINTESTINAL ENDOSCOPY     Family History  Problem Relation Age of Onset   Cerebral aneurysm Father    Alcohol abuse Father    Neuropathy Mother    Heart disease Mother        died at age 43   Migraines Brother        several   Diabetes Maternal Uncle    CAD Brother        CABG in his 67s   Colon cancer Neg Hx    Prostate cancer Neg Hx    Esophageal cancer Neg Hx    Stomach cancer Neg Hx    Rectal cancer Neg Hx    Social History   Socioeconomic History   Marital status: Married    Spouse name: Not on file   Number of children: 1   Years of education: Not on file   Highest education level: Not on file  Occupational History   Occupation: Technical brewer, retired    Comment: retired   Occupation: minister    Comment: [employer], [place] [employer]  Tobacco Use   Smoking status: Former    Packs/day: 1.00    Years: 3.00    Pack years: 3.00    Types: Cigarettes    Quit date: 03/29/1965    Years since quitting: 56.1    Smokeless tobacco: Never  Vaping Use   Vaping Use: Never used  Substance and Sexual Activity   Alcohol use: No   Drug use: No   Sexual activity: Not on file  Other Topics Concern   Not on file  Social History Narrative   Lives w/ wife   Social Determinants of Health   Financial Resource Strain: Low Risk    Difficulty of Paying Living Expenses: Not hard at all  Food Insecurity: No Food Insecurity   Worried About Charity fundraiser in the Last Year: Never true   [place] in the  Last Year: Never true  Transportation Needs: No Transportation Needs   Lack of Transportation (Medical): No   Lack of Transportation (Non-Medical): No  Physical Activity: Sufficiently Active   Days of Exercise per Week: 7 days   Minutes of Exercise per Session: 30 min  Stress: No Stress Concern Present   Feeling of Stress : Not at all  Social Connections: Moderately Integrated   Frequency of Communication with Friends and Family: More than three times a week   Frequency of Social Gatherings with Friends and Family: More than three times a week   Attends Religious Services: More than 4 times per year   Active Member of [employer] or Organizations: No   Attends Music therapist: Never   Marital Status: Married    Tobacco Counseling Counseling given: Not Answered   Clinical Intake:  Pre-visit preparation completed: Yes  Pain : No/denies pain     BMI - recorded: 31.16 Nutritional Status: BMI > 30  Obese Nutritional Risks: None Diabetes: Yes CBG done?: No Did pt. bring in CBG monitor from home?: No  How often do you need to have someone help you when you read instructions, pamphlets, or other written materials from your doctor or pharmacy?: 1 - Never  Diabetes:  Is the patient diabetic?  Yes  If diabetic, was a CBG obtained today?  No  Did the patient bring in their glucometer from home?  No phone visit How often do you monitor your CBG's? never.   Financial Strains and  Diabetes Management:  Are you having any financial strains with the device, your supplies or your medication? No .  Does the patient want to be seen by Chronic Care Management for management of their diabetes?  No  Would the patient like to be referred to a Nutritionist or for Diabetic Management?  No   Diabetic Exams:  Diabetic Eye Exam: Completed 10/23/2020.   Diabetic Foot Exam: Completed 07/17/2020.    Interpreter Needed?: No  Information entered by :: Caroleen Hamman LPN   Activities of Daily Living In your present state of health, do you have any difficulty performing the following activities: 04/30/2021 01/07/2021  Hearing? Y N  Comment hearing aid -left ear -  Vision? N N  Difficulty concentrating or making decisions? N N  Walking or climbing stairs? N N  Dressing or bathing? N N  Doing errands, shopping? N N  Preparing Food and eating ? N -  Using the Toilet? N -  In the past six months, have you accidently leaked urine? N -  Do you have problems with loss of bowel control? N -  Managing your Medications? N -  Managing your Finances? N -  Housekeeping or managing your Housekeeping? N -  Some recent data might be hidden    Patient Care Team: Colon Branch, MD as PCP - General (Internal Medicine) Delrae Rend, MD as Consulting Physician (Endocrinology) Noralee Space, MD as Consulting Physician (Pulmonary Disease) Kerin Perna., MD as Consulting Physician (Neurology) Nickie Retort, MD (Inactive) as Consulting Physician (Urology) [place] O[facility])  Indicate any recent Medical Services you may have received from other than [facility] providers in the past year (date may be approximate).     Assessment:   This is a routine wellness examination for Shylo.  Hearing/Vision screen Hearing Screening - Comments:: Hearing aid left ear Vision Screening - Comments:: Last eye exam-2 months ago  Dietary issues and exercise activities discussed: Current Exercise  Habits: The  patient does not participate in regular exercise at present;Home exercise routine, Type of exercise: walking, Time (Minutes): 30, Frequency (Times/Week): 7, Weekly Exercise (Minutes/Week): 210, Intensity: Mild, Exercise limited by: None identified   Goals Addressed             This Visit's Progress    Patient Stated       Maintain current health       Depression Screen PHQ 2/9 Scores 04/30/2021 01/07/2021 08/21/2020 01/03/2020 12/14/2018 08/08/2017 08/08/2017  PHQ - 2 Score 0 0 0 0 0 0 0    Fall Risk Fall Risk  04/30/2021 01/07/2021 08/21/2020 01/03/2020 01/02/2019  Falls in the past year? 1 0 0 0 0  Number falls in past yr: 0 0 0 0 -  Injury with Fall? 0 0 0 0 -  Comment - - - - -  Follow up Falls prevention discussed Falls evaluation completed Falls evaluation completed Falls evaluation completed -    FALL RISK PREVENTION PERTAINING TO THE HOME:  Any stairs in or around the home? Yes  If so, are there any without handrails? No  Home free of loose throw rugs in walkways, pet beds, electrical cords, etc? Yes  Adequate lighting in your home to reduce risk of falls? Yes   ASSISTIVE DEVICES UTILIZED TO PREVENT FALLS:  Life alert? No  Use of a cane, walker or w/c? No  Grab bars in the bathroom? Yes  Shower chair or bench in shower? Yes  Elevated toilet seat or a handicapped toilet? Yes   TIMED UP AND GO:  Was the test performed? No . Phone visit   Cognitive Function:Normal cognitive status assessed by this Nurse Health Advisor. No abnormalities found.   MMSE - Mini Mental State Exam 09/24/2015  Orientation to time 5  Orientation to Place 5  Registration 3  Attention/ Calculation 5  Recall 3  Language- name 2 objects 2  Language- repeat 1  Language- follow 3 step command 3  Language- read & follow direction 1  Write a sentence 1  Copy design 1  Total score 30        Immunizations Immunization History  Administered Date(s) Administered   Fluad Quad(high  Dose 65+) 12/26/2018, 01/07/2021   Hepatitis A 11/12/2005, 12/16/2005, 03/06/2015   Hepatitis B 11/12/2005, 12/16/2005, 03/06/2015   IPV 11/12/2005   Influenza Split 01/28/2011, 12/16/2011   Influenza Whole 12/25/2008   Influenza, High Dose Seasonal PF 01/09/2015, 12/13/2016   Influenza,inj,Quad PF,6+ Mos 01/10/2014   Influenza-Unspecified 11/27/2012, 12/29/2017, 12/28/2019   PFIZER(Purple Top)SARS-COV-2 Vaccination 04/15/2019, 05/05/2019, 01/16/2020, 09/25/2020   Pneumococcal Conjugate-13 09/06/2013   Pneumococcal Polysaccharide-23 12/16/2011   Td 09/24/2015   Tdap 12/16/2005   Typhoid Live 11/12/2005   Yellow Fever 11/12/2005   Zoster Recombinat (Shingrix) 12/29/2017, 02/24/2018   Zoster, Live 12/15/2012    TDAP status: Up to date  Flu Vaccine status: Up to date  Pneumococcal vaccine status: Up to date  Covid-19 vaccine status: Information provided on how to obtain vaccines.   Qualifies for Shingles Vaccine? No   Zostavax completed Yes   Shingrix Completed?: Yes  Screening Tests Health Maintenance  Topic Date Due   URINE MICROALBUMIN  04/25/2020   COVID-19 Vaccine (5 - Booster for Pfizer series) 11/20/2020   HEMOGLOBIN A1C  01/16/2021   FOOT EXAM  07/17/2021   OPHTHALMOLOGY EXAM  10/23/2021   COLONOSCOPY (Pts 45-46yrs Insurance coverage will need to be confirmed)  11/07/2021   TETANUS/TDAP  09/23/2025   Pneumonia Vaccine 28+ Years old  Completed   INFLUENZA VACCINE  Completed   Hepatitis C Screening  Completed   Zoster Vaccines- Shingrix  Completed   HPV VACCINES  Aged Out    Health Maintenance  Health Maintenance Due  Topic Date Due   URINE MICROALBUMIN  04/25/2020   COVID-19 Vaccine (5 - Booster for Pfizer series) 11/20/2020   HEMOGLOBIN A1C  01/16/2021    Colorectal cancer screening: Type of screening: Colonoscopy. Completed 11/08/2018. Repeat every 3 years  Lung Cancer Screening: (Low Dose CT Chest recommended if Age 60-80 years, 30 pack-year currently  smoking OR have quit w/in 15years.) does not qualify.     Additional Screening:  Hepatitis C Screening: Completed 03/17/2015  Vision Screening: Recommended annual ophthalmology exams for early detection of glaucoma and other disorders of the eye. Is the patient up to date with their annual eye exam?  Yes  Who is the provider or what is the name of the office in which the patient attends annual eye exams? [place] Screening: Recommended annual dental exams for proper oral hygiene  Community Resource Referral / Chronic Care Management: CRR required this visit?  No   CCM required this visit?  No      Plan:     I have personally reviewed and noted the following in the patients chart:   Medical and social history Use of alcohol, tobacco or illicit drugs  Current medications and supplements including opioid prescriptions. Patient is not currently taking opioid prescriptions. Functional ability and status Nutritional status Physical activity Advanced directives List of other physicians Hospitalizations, surgeries, and ER visits in previous 12 months Vitals Screenings to include cognitive, depression, and falls Referrals and appointments  In addition, I have reviewed and discussed with patient certain preventive protocols, quality metrics, and best practice recommendations. A written personalized care plan for preventive services as well as general preventive health recommendations were provided to patient.   Due to this being a telephonic visit, the after visit summary with patients personalized plan was offered to patient via mail or my-chart.  Patient would like to access on my-chart.   Marta Antu, LPN   04/03/1094  Nurse Health Advisor  Nurse Notes: None  I have reviewed and agree with Health Coaches documentation.  Kathlene November, MD

## 2021-06-11 ENCOUNTER — Encounter: Payer: Self-pay | Admitting: Internal Medicine

## 2021-06-11 DIAGNOSIS — L570 Actinic keratosis: Secondary | ICD-10-CM | POA: Diagnosis not present

## 2021-06-11 DIAGNOSIS — Z08 Encounter for follow-up examination after completed treatment for malignant neoplasm: Secondary | ICD-10-CM | POA: Diagnosis not present

## 2021-06-11 DIAGNOSIS — Z85828 Personal history of other malignant neoplasm of skin: Secondary | ICD-10-CM | POA: Diagnosis not present

## 2021-06-11 DIAGNOSIS — L814 Other melanin hyperpigmentation: Secondary | ICD-10-CM | POA: Diagnosis not present

## 2021-06-11 DIAGNOSIS — L821 Other seborrheic keratosis: Secondary | ICD-10-CM | POA: Diagnosis not present

## 2021-06-11 DIAGNOSIS — D225 Melanocytic nevi of trunk: Secondary | ICD-10-CM | POA: Diagnosis not present

## 2021-06-17 ENCOUNTER — Ambulatory Visit: Payer: Medicare PPO | Admitting: Pulmonary Disease

## 2021-06-18 DIAGNOSIS — R972 Elevated prostate specific antigen [PSA]: Secondary | ICD-10-CM | POA: Diagnosis not present

## 2021-06-18 LAB — PSA: PSA: 4.24

## 2021-06-25 DIAGNOSIS — N400 Enlarged prostate without lower urinary tract symptoms: Secondary | ICD-10-CM | POA: Diagnosis not present

## 2021-06-25 DIAGNOSIS — R972 Elevated prostate specific antigen [PSA]: Secondary | ICD-10-CM | POA: Diagnosis not present

## 2021-06-29 ENCOUNTER — Encounter: Payer: Self-pay | Admitting: Internal Medicine

## 2021-07-09 ENCOUNTER — Encounter: Payer: Self-pay | Admitting: Internal Medicine

## 2021-07-09 ENCOUNTER — Ambulatory Visit: Payer: Medicare PPO | Admitting: Internal Medicine

## 2021-07-09 VITALS — BP 132/68 | HR 64 | Temp 97.8°F | Resp 18 | Ht 73.0 in | Wt 263.1 lb

## 2021-07-09 DIAGNOSIS — I10 Essential (primary) hypertension: Secondary | ICD-10-CM

## 2021-07-09 DIAGNOSIS — R519 Headache, unspecified: Secondary | ICD-10-CM | POA: Diagnosis not present

## 2021-07-09 DIAGNOSIS — R972 Elevated prostate specific antigen [PSA]: Secondary | ICD-10-CM

## 2021-07-09 DIAGNOSIS — R42 Dizziness and giddiness: Secondary | ICD-10-CM

## 2021-07-09 DIAGNOSIS — N419 Inflammatory disease of prostate, unspecified: Secondary | ICD-10-CM | POA: Diagnosis not present

## 2021-07-09 DIAGNOSIS — E785 Hyperlipidemia, unspecified: Secondary | ICD-10-CM | POA: Diagnosis not present

## 2021-07-09 LAB — BASIC METABOLIC PANEL
BUN: 21 mg/dL (ref 6–23)
CO2: 32 mEq/L (ref 19–32)
Calcium: 9.6 mg/dL (ref 8.4–10.5)
Chloride: 105 mEq/L (ref 96–112)
Creatinine, Ser: 1.19 mg/dL (ref 0.40–1.50)
GFR: 60.04 mL/min (ref >60.00)
Glucose, Bld: 100 mg/dL — ABNORMAL HIGH (ref 70–99)
Potassium: 4.2 mEq/L (ref 3.5–5.1)
Sodium: 141 mEq/L (ref 135–145)

## 2021-07-09 LAB — SEDIMENTATION RATE: Sed Rate: 2 mm/hr (ref 0–20)

## 2021-07-09 NOTE — Patient Instructions (Signed)
Check the  blood pressure regularly ?BP GOAL is between 110/65 and  135/85. ?If it is consistently higher or lower, let me know ? ? ?We are ordering a brain MRI for your headache ? ?I am referring you to a neurologist. ? ?If you have another bout of severe headache let us know ? ? ? ?GO TO THE LAB : Get the blood work   ? ? ?[place], [place] ?Come back for a checkup in 4 months ?

## 2021-07-09 NOTE — Assessment & Plan Note (Signed)
Headache: ?The patient reports a long history of headaches, has seen neurology before, multiple medications tried-failed, including Botox, currently on no meds. ?For 3 days he had a "worst headache of his life" associated with dizziness. ?He also have other type of headaches that are mild and apparently allergy related. ?Given recent severe headache, dizziness and severity he needs further eval. ?Plan: Brain MRI, seek help if symptoms return, go back to neurology, sed rate. 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  Check BMP ?Dyslipidemia: On high doses of statins, Crestor 40 mg, last LDL 83.  No change for now ?Aortic stenosis and calcification: We reviewed in detail the echo report from November 2022.  Explained that this is a common finding, as long as he is not having chest pain, shortness of breath or near syncope plan is to continue monitoring the situation w/ a echo in 2 to 3 years. ?Prostatitis: ?PSA elevated, was 5.0 October 2022, saw urology, exam was benign, was Rx ABX, subsequent PSA improve, last visit March 2023.  They agreed to continue prostate cancer screening. ?The patient was somewhat upset because I did not prescribe antibiotics over the phone.  I explained to him that is not good practice. ?RTC 4 months ?

## 2021-07-09 NOTE — Progress Notes (Signed)
? ?Subjective:  ? ? Patient ID: Ronald King, male    DOB: [birth year], 76 y.o.   MRN: [id number] ? ?DOS:  07/09/2021 ?Type of visit - description: f/u ? ?The patient has multiple concerns. ?Likes to discuss recent PSA elevation. ?Likes to discuss his echocardiogram that showed mild aortic stenosis. ?His main concern however is headaches. ?He has a long history of headaches however for a period of 3 days he had "the worst headache of his life". ?The headache was global and was associated with severe dizziness. ?There was no nausea.  He had some light intolerance.  No major neck pain. ?The patient did a self  Epley maneuver he learned online and reportedly the dizziness "resolved completely and immediately". ?At this point the severe headache is gone. ?He also has chronic on and off mild frontal headaches, typically related to the weather or environmental issues such as the pollen. ? ?Denies diplopia, slurred speech or motor deficit ? ?Review of Systems ?See above  ? ?Past Medical History:  ?Diagnosis Date  ? Allergy   ? Anemia   ? Anxiety   ? Blood transfusion without reported diagnosis   ? Chronic bronchitis   ? Chronic cough   ? Diabetes ([place]) 07-2011  ? per Dr Buddy Duty  ? DJD (degenerative joint disease)   ? Dyslipidemia   ? Esophageal polyp   ? GERD (gastroesophageal reflux disease)   ? s/p [employer] F.  ? Hemorrhoids, internal   ? History of gastrointestinal hemorrhage   ? Hypertension   ? Hypogonadism, male   ? per Dr Buddy Duty  ? Neuromuscular disorder ([place])   ? neuropathy feet  ? Neuropathy   ? per neuro-Dr Joselyn Glassman vicodin  ? OSA (obstructive sleep apnea)   ? on CPAP  ? Sleep apnea   ? CPAP at night  ? Vitamin D deficiency   ? ? ?Past Surgical History:  ?Procedure Laterality Date  ? ANAL FISSURE REPAIR    ? hand fracture surgery    ? left  ? KNEE ARTHROSCOPY  2007  ? Dr. Marlou Sa  ? LAPAROSCOPIC NISSEN FUNDOPLICATION  05/5359  ? Dr. Hassell Done  ? NOSE SURGERY    ? TONSILLECTOMY    ? UPPER GASTROINTESTINAL ENDOSCOPY     ? ? ?Current Outpatient Medications  ?Medication Instructions  ? amLODipine (NORVASC) 10 MG tablet TAKE 1 TABLET(10 MG) BY MOUTH DAILY  ? amoxicillin (AMOXIL) 500 mg, Oral, 3 times daily, As needed for bronchitis flares  ? ascorbic acid (VITAMIN C) 1,000 mg, Oral, Daily  ? Calcium Carbonate-Vitamin D 600-400 MG-UNIT tablet 1 tablet, Oral, Daily,    ? clonazePAM (KLONOPIN) 1 mg, Oral, 2 times daily,    ? Coenzyme Q10 (CO Q 10 PO) 1 tablet, Oral, Daily  ? Cyanocobalamin (B-12) 2000 MCG TABS 1 tablet, Oral, Daily  ? fenofibrate (TRICOR) 145 MG tablet TAKE 1 TABLET(145 MG) BY MOUTH DAILY  ? HYDROcodone bit-homatropine (HYCODAN) 5-1.5 MG/5ML syrup 5 mLs, Oral, Every 6 hours PRN  ? lansoprazole (PREVACID) 30 MG capsule TAKE 1 CAPSULE(30 MG) BY MOUTH TWICE DAILY  ? Multiple Vitamins-Minerals (CENTRUM SILVER PO) 1 tablet, Oral, Daily, Reported on 03/17/2015  ? PARoxetine (PAXIL) 20 mg, Oral, Daily  ? pyridOXINE (VITAMIN B-6) 100 mg, Oral, Daily  ? rosuvastatin (CRESTOR) 40 MG tablet TAKE 1 TABLET(40 MG) BY MOUTH AT BEDTIME  ? sucralfate (CARAFATE) 1 g tablet TAKE 1 TABLET(1 GRAM) BY MOUTH TWICE DAILY  ? Trulicity 4.43 mg, Subcutaneous, Weekly  ? ? ?   ?  Objective:  ? Physical Exam ?BP 132/68 (BP Location: Left Arm, Patient Position: Sitting, Cuff Size: Normal)   Pulse 64   Temp 97.8 ?F (36.6 ?C) (Oral)   Resp 18   Ht '6\' 1"'$  (1.854 m)   Wt 263 lb 2 oz (119.4 kg)   SpO2 95%   BMI 34.72 kg/m?  ?General:   ?Well developed, NAD, BMI noted. ?HEENT:  ?Normocephalic . Face symmetric, atraumatic ?Neck: Normal carotid pulses bilateral ?Lungs:  ?CTA B ?Normal respiratory effort, no intercostal retractions, no accessory muscle use. ?Heart: RRR, very subtle systolic murmur.  ?Lower extremities: no pretibial edema bilaterally  ?Skin: Not pale. Not jaundice ?Neurologic:  ?alert & oriented X3.  ?Speech normal, gait appropriate for age and unassisted. ?EOMI, motor symmetric.  DTR symmetric ?Psych--  ?Cognition and judgment appear  intact.  ?Cooperative with normal attention span and concentration.  ?Behavior appropriate. ?No anxious or depressed appearing.  ? ?   ?Assessment   ? ? Assessment  ?ENDO: Dr Buddy Duty ?-DM ?- hypogonadism  ?HTN ?Dislipidemia ?NEURO: see note 08/08/2017 ?--+ Neuropathy  ?--RLS type of symptoms ?-- Headaches, seen neuro before, multiple meds including botox ? Obesity, ?Anxiety -- paxil- clonazepam (per Dr Tye Savoy ) ?Vitamin D deficiency ?PULM:  ?--OSA, CPAP ?--Chronic cough  ?DJD, Dr. Marlou Sa ?GU: Dr Erenest Rasher ?BPH, ED  , prostatitis  ?GI: ?--GERD, status post Nissen fundoplication ?--Esophageal polyp ?-- h/o GI bleed- tranfussion (90s) ? ?PLAN: ?Headache: ?The patient reports a long history of headaches, has seen neurology before, multiple medications tried-failed, including Botox, currently on no meds. ?For 3 days he had a "worst headache of his life" associated with dizziness. ?He also have other type of headaches that are mild and apparently allergy related. ?Given recent severe headache, dizziness and severity he needs further eval. ?Plan: Brain MRI, seek help if symptoms return, go back to neurology, sed rate. ?DM: Per Endo ?GUY:QIHKV well controlled on amlodipine.  Check BMP ?Dyslipidemia: On high doses of statins, Crestor 40 mg, last LDL 83.  No change for now ?Aortic stenosis and calcification: We reviewed in detail the echo report from November 2022.  Explained that this is a common finding, as long as he is not having chest pain, shortness of breath or near syncope plan is to continue monitoring the situation w/ a echo in 2 to 3 years. ?Prostatitis: ?PSA elevated, was 5.0 October 2022, saw urology, exam was benign, was Rx ABX, subsequent PSA improve, last visit March 2023.  They agreed to continue prostate cancer screening. ?The patient was somewhat upset because I did not prescribe antibiotics over the phone.  I explained to him that is not good practice. ?RTC 4 months ?  ?Time spent 35 minutes, multiple questions  answered to the patient to the best of my ability including educating him about prescribing antibiotics over the phone. ?This visit occurred during the SARS-CoV-2 public health emergency.  Safety protocols were in place, including screening questions prior to the visit, additional usage of staff PPE, and extensive cleaning of exam room while observing appropriate contact time as indicated for disinfecting solutions.  ? ?

## 2021-07-11 ENCOUNTER — Other Ambulatory Visit: Payer: Self-pay | Admitting: Gastroenterology

## 2021-07-15 ENCOUNTER — Ambulatory Visit (HOSPITAL_BASED_OUTPATIENT_CLINIC_OR_DEPARTMENT_OTHER)
Admission: RE | Admit: 2021-07-15 | Discharge: 2021-07-15 | Disposition: A | Discharge status: Home / Self Care | Payer: Medicare PPO | Source: Ambulatory Visit | Attending: Internal Medicine | Admitting: Internal Medicine

## 2021-07-15 ENCOUNTER — Ambulatory Visit: Payer: Medicare PPO | Admitting: Pulmonary Disease

## 2021-07-15 ENCOUNTER — Encounter: Payer: Self-pay | Admitting: Pulmonary Disease

## 2021-07-15 VITALS — BP 130/76 | HR 76 | Temp 98.1°F | Ht 73.0 in | Wt 269.9 lb

## 2021-07-15 DIAGNOSIS — Z789 Other specified health status: Secondary | ICD-10-CM

## 2021-07-15 DIAGNOSIS — R42 Dizziness and giddiness: Secondary | ICD-10-CM | POA: Diagnosis not present

## 2021-07-15 DIAGNOSIS — R519 Headache, unspecified: Secondary | ICD-10-CM | POA: Insufficient documentation

## 2021-07-15 DIAGNOSIS — H0520 Unspecified exophthalmos: Secondary | ICD-10-CM | POA: Diagnosis not present

## 2021-07-15 DIAGNOSIS — G4733 Obstructive sleep apnea (adult) (pediatric): Secondary | ICD-10-CM | POA: Diagnosis not present

## 2021-07-15 MED ORDER — HYDROCODONE BIT-HOMATROP MBR 5-1.5 MG/5ML PO SOLN: 5.0000 mL | Freq: Four times a day (QID) | ORAL | 0 refills | Status: DC | PRN

## 2021-07-15 NOTE — Progress Notes (Signed)
? ?[facility] ? ?Chief Complaint  ?Patient presents with  ? Follow-up  ?  Mask leaking  ? ? ?Constitutional:  ?BP 130/76 (BP Location: Left Arm, Patient Position: Sitting, Cuff Size: Large)   Pulse 76   Temp 98.1 ?F (36.7 ?C) (Oral)   Ht '6\' 1"'$  (1.854 m)   Wt 269 lb 14.4 oz (122.4 kg)   SpO2 97%   BMI 35.61 kg/m?  ? ?Past Medical History:  ?HTN, HLD, GERD, IBS, hemorrhoids, BPH, ED, DJD, HA, Vit D deficiency, Neuropathy, Anxiety ? ?Past Surgical History:  ?He  has a past surgical history that includes Laparoscopic Nissen fundoplication (11/4707); Knee arthroscopy (2007); Tonsillectomy; Nose surgery; Anal fissure repair; hand fracture surgery; and Upper gastrointestinal endoscopy. ? ?Brief Summary:  ?Ronald King is a 76 y.o. male former smoker with cough and obstructive sleep apnea.  Former patient of Dr. Lenna Gilford. ?  ? ? ? ?Subjective:  ? ?Having trouble with air leak from his new masks.  Pressure feels okay. ? ?Used antibiotic about 1 month ago. ? ?Has more sinus congestion and cough with allergies.  Needs refill of cough medicine and wants a bigger bottle if he can get this. ? ?He is going on a trip [place] during the [place]. ? ?Physical Exam:  ? ?Appearance - well kempt  ? ?ENMT - no sinus tenderness, no oral exudate, no LAN, Mallampati 3 airway, no stridor ? ?Respiratory - equal breath sounds bilaterally, no wheezing or rales ? ?CV - s1s2 regular rate and rhythm, 2/6 ? ?Ext - no clubbing, no edema ? ?Skin - no rashes ? ?Psych - normal mood and affect ? ? ?  ?Pulmonary testing:  ?Spirometry September 2013 >> FEV 1 3.46 (86%), FEV1% 87 ?RAST 11/15/18 >> grasses, IgE 87 ?PFT 12/25/18 >> FEV1 3.13 (91%), FEV1% 86, TLC 6.14 (82%), DLCO 103%, +BD from FEF 25-75 ? ?Sleep Tests:  ?PSG 09/02/97 >> AHI 3 ?CPAP 04/15/21 to 07/12/21 >> used on 87 of 90 nights with average 8 hrs 32 min.  Average AHI 4.3 with CPAP 16 cm H2O ? ?Social History:  ?He  reports that he quit smoking  about 56 years ago. His smoking use included cigarettes. He has a 3.00 pack-year smoking history. He has never used smokeless tobacco. He reports that he does not drink alcohol and does not use drugs. ? ?Family History:  ?His family history includes Alcohol abuse in his father; CAD in his brother; Cerebral aneurysm in his father; Diabetes in his maternal uncle; Heart disease in his mother; Migraines in his brother; Neuropathy in his mother. ?  ? ? ?Assessment/Plan:  ? ?Chronic cough. 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 ?- previously seen by Dr. Janace Hoard with ENT ?- nasal steroids sprays reported to be ineffective for him ?- prn afrin and nasal irrigation ?- advised him to limit use of afrin ?  ?Sleep apnea, NOS. ?- he is compliant with CPAP and reports benefit ?- uses  [facility] for his DME ?- continue CPAP 16 cm H2O ?- will see if he can get his mask refit ?  ?GERD. ?- followed by Dr. Silverio Decamp with GI ? ?New heart murmur. ?- Echocardiogram ordered by PCP ? ?Elevated PSA. ?- he will follow up with urology ? ?Time Spent Involved in Patient Care on Day of Examination:  ?28 minutes ? ?Follow up:  ? ?Patient Instructions  ?Will arrange for refitting of your CPAP mask ? ?Follow up in 6 months ? ?Medication List:  ? ?Allergies as of 07/15/2021   ? ?   Reactions  ? Montelukast Shortness Of Breath  ? Ceftin [cefuroxime Axetil]   ? Cymbalta [duloxetine Hcl]   ? Duloxetine   ? Other reaction(s): Other (See Comments) ?refuses  ? Lyrica [pregabalin] Other (See  Comments)  ? Drowsiness  ? Neurontin [gabapentin] Other (See Comments)  ? Drowsiness   ? ?  ? ?  ?Medication List  ?  ? ?  ? Accurate as of July 15, 2021  5:27 PM. If you have any questions, ask your nurse or doctor.  ?  ?  ? ?  ? ?amLODipine 10 MG tablet ?Commonly known as: NORVASC ?TAKE 1 TABLET(10 MG) BY MOUTH DAILY ?  ?amoxicillin 500 MG tablet ?Commonly known as: AMOXIL ?Take 1 tablet (500 mg total) by mouth 3 (three) times daily. As needed for bronchitis flares ?  ?ascorbic acid 500 MG tablet ?Commonly known as: VITAMIN C ?Take 1,000 mg by mouth daily. ?  ?B-12 2000 MCG Tabs ?Take 1 tablet by mouth daily. ?  ?Calcium Carbonate-Vitamin D 600-400 MG-UNIT tablet ?Take 1 tablet by mouth daily. ?  ?CENTRUM SILVER PO ?Take 1 tablet by mouth daily. Reported on 03/17/2015 ?  ?clonazePAM 1 MG tablet ?Commonly known as: KLONOPIN ?Take 1 mg by mouth 2 (two) times daily. ?  ?CO Q 10 PO ?Take 1 tablet by mouth daily. ?  ?fenofibrate 145 MG tablet ?Commonly known as: TRICOR ?TAKE 1 TABLET(145 MG) BY MOUTH DAILY ?  ?HYDROcodone bit-homatropine 5-1.5 MG/5ML syrup ?Commonly known as: HYCODAN ?Take 5 mLs by mouth every 6 (six) hours as needed for cough. ?  ?lansoprazole 30 MG capsule ?Commonly known as: PREVACID ?TAKE 1 CAPSULE(30 MG) BY MOUTH TWICE DAILY ?  ?PARoxetine 20 MG tablet ?Commonly known as: PAXIL ?Take 20 mg by mouth daily. ?  ?pyridOXINE 100 MG tablet ?Commonly known as: VITAMIN B-6 ?Take 100 mg by mouth daily. ?  ?rosuvastatin 40 MG tablet ?Commonly known as: CRESTOR ?TAKE 1 TABLET(40 MG) BY MOUTH AT BEDTIME ?  ?sucralfate 1 g tablet ?Commonly known as: CARAFATE ?TAKE 1 TABLET(1 GRAM) BY MOUTH TWICE DAILY ?What changed: See the new instructions. ?  ?Trulicity 6.97 XY/8.0XK Sopn ?Generic drug: Dulaglutide ?Inject 0.75 mg into the skin once a week. ?  ? ?  ? ? ?Signature:  ?Chesley Mires, MD ?[facility] [facility] ?Pager - [phone] ?07/15/2021, 5:27 PM ?  ? ? ? ? ? ? ? ? ?

## 2021-07-15 NOTE — Patient Instructions (Signed)
Will arrange for refitting of your CPAP mask ? ?Follow up in 6 months ?

## 2021-07-16 ENCOUNTER — Encounter: Payer: Self-pay | Admitting: Internal Medicine

## 2021-07-23 DIAGNOSIS — H0520 Unspecified exophthalmos: Secondary | ICD-10-CM | POA: Diagnosis not present

## 2021-07-23 DIAGNOSIS — E230 Hypopituitarism: Secondary | ICD-10-CM | POA: Diagnosis not present

## 2021-07-23 DIAGNOSIS — E119 Type 2 diabetes mellitus without complications: Secondary | ICD-10-CM | POA: Diagnosis not present

## 2021-07-23 DIAGNOSIS — R972 Elevated prostate specific antigen [PSA]: Secondary | ICD-10-CM | POA: Diagnosis not present

## 2021-07-23 LAB — HM DIABETES FOOT EXAM: HM Diabetic Foot Exam: ABNORMAL

## 2021-07-23 LAB — PROTEIN / CREATININE RATIO, URINE: Creatinine, Urine: 123

## 2021-07-23 LAB — MICROALBUMIN, URINE: Microalb, Ur: 1.55

## 2021-07-23 LAB — TSH: TSH: 2.76 (ref <=5.90)

## 2021-07-24 ENCOUNTER — Telehealth: Payer: Self-pay | Admitting: Internal Medicine

## 2021-07-24 DIAGNOSIS — R519 Headache, unspecified: Secondary | ICD-10-CM

## 2021-07-24 NOTE — Telephone Encounter (Signed)
Pt's wife stated they would like to start seeing Dr.Jaffe at [facility]. Stated he has already seen Dr.Paz for this.  ?

## 2021-07-24 NOTE — Telephone Encounter (Signed)
Assuming this is about the headaches/migraines. Referral placed.  ?

## 2021-07-28 ENCOUNTER — Encounter: Payer: Self-pay | Admitting: Neurology

## 2021-07-30 ENCOUNTER — Other Ambulatory Visit: Payer: Self-pay | Admitting: Internal Medicine

## 2021-08-03 ENCOUNTER — Ambulatory Visit: Payer: Medicare PPO | Admitting: Neurology

## 2021-08-03 ENCOUNTER — Encounter: Payer: Self-pay | Admitting: Neurology

## 2021-08-03 VITALS — BP 130/70 | HR 69 | Ht 72.0 in | Wt 265.0 lb

## 2021-08-03 DIAGNOSIS — I679 Cerebrovascular disease, unspecified: Secondary | ICD-10-CM

## 2021-08-03 DIAGNOSIS — G43019 Migraine without aura, intractable, without status migrainosus: Secondary | ICD-10-CM | POA: Diagnosis not present

## 2021-08-03 NOTE — Patient Instructions (Signed)
Control stroke risk factors:  blood pressure control, cholesterol, etc.  ?Consider taking an aspirin '81mg'$  daily to help prevent stroke ?Mediterranean diet ?Follow up  as needed ? ? ? ?Mediterranean Diet ?A Mediterranean diet refers to food and lifestyle choices that are based on the traditions of countries located on the [employer]. It focuses on eating more fruits, vegetables, whole grains, beans, nuts, seeds, and heart-healthy fats, and eating less dairy, meat, eggs, and processed foods with added sugar, salt, and fat. This way of eating has been shown to help prevent certain conditions and improve outcomes for people who have chronic diseases, like kidney disease and heart disease. ?What are tips for following this plan? ?Reading food labels ?Check the serving size of packaged foods. For foods such as rice and pasta, the serving size refers to the amount of cooked product, not dry. ?Check the total fat in packaged foods. Avoid foods that have saturated fat or trans fats. ?Check the ingredient list for added sugars, such as corn syrup. ?Shopping ? ?Buy a variety of foods that offer a balanced diet, including: ?Fresh fruits and vegetables (produce). ?Grains, beans, nuts, and seeds. Some of these may be available in unpackaged forms or large amounts (in bulk). ?Fresh seafood. ?Poultry and eggs. ?Low-fat dairy products. ?Buy whole ingredients instead of prepackaged foods. ?Buy fresh fruits and vegetables in-season from local farmers markets. ?Buy plain frozen fruits and vegetables. ?If you do not have access to quality fresh seafood, buy precooked frozen shrimp or canned fish, such as tuna, salmon, or sardines. ?Stock your pantry so you always have certain foods on hand, such as olive oil, canned tuna, canned tomatoes, rice, pasta, and beans. ?Cooking ?Cook foods with extra-virgin olive oil instead of using butter or other vegetable oils. ?Have meat as a side dish, and have vegetables or grains as your main  dish. This means having meat in small portions or adding small amounts of meat to foods like pasta or stew. ?Use beans or vegetables instead of meat in common dishes like chili or lasagna. ?Experiment with different cooking methods. Try roasting, broiling, steaming, and saut?ing vegetables. ?Add frozen vegetables to soups, stews, pasta, or rice. ?Add nuts or seeds for added healthy fats and plant protein at each meal. You can add these to yogurt, salads, or vegetable dishes. ?Marinate fish or vegetables using olive oil, lemon juice, garlic, and fresh herbs. ?Meal planning ?Plan to eat one vegetarian meal one day each week. Try to work up to two vegetarian meals, if possible. ?Eat seafood two or more times a week. ?Have healthy snacks readily available, such as: ?Vegetable sticks with hummus. ?[place] yogurt. ?Fruit and nut trail mix. ?Eat balanced meals throughout the week. This includes: ?Fruit: 2-3 servings a day. ?Vegetables: 4-5 servings a day. ?Low-fat dairy: 2 servings a day. ?Fish, poultry, or lean meat: 1 serving a day. ?Beans and legumes: 2 or more servings a week. ?Nuts and seeds: 1-2 servings a day. ?Whole grains: 6-8 servings a day. ?Extra-virgin olive oil: 3-4 servings a day. ?Limit red meat and sweets to only a few servings a month. ?Lifestyle ? ?Cook and eat meals together with your family, when possible. ?Drink enough fluid to keep your urine pale yellow. ?Be physically active every day. This includes: ?Aerobic exercise like running or swimming. ?Leisure activities like gardening, walking, or housework. ?Get 7-8 hours of sleep each night. ?If recommended by your health care provider, drink red wine in moderation. This means 1 glass a day for  nonpregnant women and 2 glasses a day for men. A glass of wine equals 5 oz (150 mL). ?What foods should I eat? ?Fruits ?Apples. Apricots. Avocado. Berries. Bananas. Cherries. Dates. Figs. Grapes. Lemons. Melon. Oranges. Peaches. Plums.  Pomegranate. ?Vegetables ?Artichokes. Beets. Broccoli. Cabbage. Carrots. Eggplant. Green beans. Chard. Kale. Spinach. Onions. Leeks. Peas. Squash. Tomatoes. Peppers. Radishes. ?Grains ?Whole-grain pasta. Brown rice. Bulgur wheat. Polenta. Couscous. Whole-wheat bread. [place]. ?Meats and other proteins ?Beans. Almonds. Sunflower seeds. Pine nuts. Peanuts. [place]. Salmon. Scallops. Shrimp. [place]. Tilapia. Clams. Oysters. Eggs. Poultry without skin. ?Dairy ?Low-fat milk. Cheese. Greek yogurt. ?Fats and oils ?Extra-virgin olive oil. Avocado oil. Grapeseed oil. ?Beverages ?Water. Red wine. Herbal tea. ?Sweets and desserts ?Greek yogurt with honey. Baked apples. Poached pears. Trail mix. ?Seasonings and condiments ?[place]. Cilantro. Coriander. Cumin. Mint. Parsley. Sage. Rosemary. Tarragon. Garlic. Oregano. Thyme. Pepper. Balsamic vinegar. Tahini. Hummus. Tomato sauce. Olives. Mushrooms. ?The items listed above may not be a complete list of foods and beverages you can eat. Contact a dietitian for more information. ?What foods should I limit? ?This is a list of foods that should be eaten rarely or only on special occasions. ?Fruits ?Fruit canned in syrup. ?Vegetables ?Deep-fried potatoes (french fries). ?Grains ?Prepackaged pasta or rice dishes. Prepackaged cereal with added sugar. Prepackaged snacks with added sugar. ?Meats and other proteins ?Beef. Pork. Lamb. Poultry with skin. Hot dogs. [place] Sal[place]. ?Dairy ?Ice cream. Sour cream. Whole milk. ?Fats and oils ?Butter. Canola oil. Vegetable oil. Beef fat (tallow). Lard. ?Beverages ?Juice. Sugar-sweetened soft drinks. Beer. Liquor and spirits. ?Sweets and desserts ?Cookies. Cakes. Pies. Candy. ?Seasonings and condiments ?Mayonnaise. Pre-made sauces and marinades. ?The items listed above may not be a complete list of foods and beverages you should limit. Contact a dietitian for more information. ?Summary ?The Mediterranean diet includes both food and lifestyle choices. ?Eat a  variety of fresh fruits and vegetables, beans, nuts, seeds, and whole grains. ?Limit the amount of red meat and sweets that you eat. ?If recommended by your health care provider, drink red wine in moderation. This means 1 glass a day for nonpregnant women and 2 glasses a day for men. A glass of wine equals 5 oz (150 mL). ?This information is not intended to replace advice given to you by your health care provider. Make sure you discuss any questions you have with your health care provider. ?Document Revised: 04/20/2019 Document Reviewed: 02/15/2019 ?Elsevier Patient Education ? [place]. ? ?

## 2021-08-03 NOTE — Progress Notes (Signed)
? ?NEUROLOGY CONSULTATION NOTE ? ?[name] ?MRN: [id number] ?DOB: 06/27/1945 ? ?Referring provider: Kathlene November, MD ?Primary care provider: Kathlene November, MD ? ?Reason for consult:  headache, MRI results ? ?Assessment/Plan:  ? ?Suspect the headache in March was an intractable migraine triggered by the toothache and changes in barometric pressure ?MRI findings indicative of cerebrovascular disease ? ? ?1  Management of stroke risk factors as per PCP.  Consider starting ASA '81mg'$  daily ?2  If headaches return, follow up ? ? ?Subjective:  ?Ronald King is a 76 year old male with HTN, idiopathic polyneuropathy and hyperglycemia who presents for headache and MRI findings.  History supplemented by his wife who accompanies him today. ? ?In March, he had a toothache in the right lower jaw.  He saw the dentist who grinded it down and provided him with a mouth guard.  There was significant change in barometric pressure around this time.  He developed a severe right temporal throbbing headache that became diffuse and associated with nausea, photophobia and phonophobia.  Headache was persistent and lasted for 3 days but took about another day to fully resolve.  He has migraines with change in weather but nothing like this.  Sed rate on 07/09/2021 was 2.  MRI of brain without contrast on 07/15/2021 personally reviewed showed moderate chronic small vessel ischemic changes and few scattered punctate microhemorrhages. ? ? ? ?PAST MEDICAL HISTORY: ?Past Medical History:  ?Diagnosis Date  ? Allergy   ? Anemia   ? Anxiety   ? Blood transfusion without reported diagnosis   ? Chronic bronchitis   ? Chronic cough   ? Diabetes ([place]) 07-2011  ? per Dr Buddy Duty  ? DJD (degenerative joint disease)   ? Dyslipidemia   ? Esophageal polyp   ? GERD (gastroesophageal reflux disease)   ? s/p [employer].  ? Hemorrhoids, internal   ? History of gastrointestinal hemorrhage   ? Hypertension   ? Hypogonadism, male   ? per Dr Buddy Duty  ? Neuromuscular  disorder ([place])   ? neuropathy feet  ? Neuropathy   ? per neuro-Dr Joselyn Glassman vicodin  ? OSA (obstructive sleep apnea)   ? on CPAP  ? Sleep apnea   ? CPAP at night  ? Vitamin D deficiency   ? ? ?PAST SURGICAL HISTORY: ?Past Surgical History:  ?Procedure Laterality Date  ? ANAL FISSURE REPAIR    ? hand fracture surgery    ? left  ? KNEE ARTHROSCOPY  2007  ? Dr. Marlou Sa  ? LAPAROSCOPIC NISSEN FUNDOPLICATION  04/5425  ? Dr. Hassell Done  ? NOSE SURGERY    ? TONSILLECTOMY    ? UPPER GASTROINTESTINAL ENDOSCOPY    ? ? ?MEDICATIONS: ?Current Outpatient Medications on File Prior to Visit  ?Medication Sig Dispense Refill  ? amLODipine (NORVASC) 10 MG tablet TAKE 1 TABLET(10 MG) BY MOUTH DAILY 90 tablet 1  ? amoxicillin (AMOXIL) 500 MG tablet Take 1 tablet (500 mg total) by mouth 3 (three) times daily. As needed for bronchitis flares 30 tablet 1  ? ascorbic acid (VITAMIN C) 500 MG tablet Take 1,000 mg by mouth daily.    ? Calcium Carbonate-Vitamin D 600-400 MG-UNIT tablet Take 1 tablet by mouth daily.    ? clonazePAM (KLONOPIN) 1 MG tablet Take 1 mg by mouth 2 (two) times daily.    ? Coenzyme Q10 (CO Q 10 PO) Take 1 tablet by mouth daily.     ? Cyanocobalamin (B-12) 2000 MCG TABS Take 1 tablet by  mouth daily.    ? Dulaglutide (TRULICITY) 1.30 QM/5.7QI SOPN Inject 0.75 mg into the skin once a week.    ? fenofibrate (TRICOR) 145 MG tablet TAKE 1 TABLET(145 MG) BY MOUTH DAILY 90 tablet 1  ? HYDROcodone bit-homatropine (HYCODAN) 5-1.5 MG/5ML syrup Take 5 mLs by mouth every 6 (six) hours as needed for cough. 473 mL 0  ? lansoprazole (PREVACID) 30 MG capsule TAKE 1 CAPSULE(30 MG) BY MOUTH TWICE DAILY 180 capsule 3  ? Multiple Vitamins-Minerals (CENTRUM SILVER PO) Take 1 tablet by mouth daily. Reported on 03/17/2015    ? PARoxetine (PAXIL) 20 MG tablet Take 20 mg by mouth daily.    ? pyridOXINE (VITAMIN B-6) 100 MG tablet Take 100 mg by mouth daily.    ? rosuvastatin (CRESTOR) 40 MG tablet TAKE 1 TABLET(40 MG) BY MOUTH AT BEDTIME 90 tablet 1  ?  sucralfate (CARAFATE) 1 g tablet TAKE 1 TABLET(1 GRAM) BY MOUTH TWICE DAILY (Patient taking differently: as needed.) 30 tablet 1  ? ?No current facility-administered medications on file prior to visit.  ? ? ?ALLERGIES: ?Allergies  ?Allergen Reactions  ? Montelukast Shortness Of Breath  ? Ceftin [Cefuroxime Axetil]   ? Cymbalta [Duloxetine Hcl]   ? Duloxetine   ?  Other reaction(s): Other (See Comments) ?refuses  ? Lyrica [Pregabalin] Other (See Comments)  ?  Drowsiness  ? Neurontin [Gabapentin] Other (See Comments)  ?  Drowsiness   ? ? ?FAMILY HISTORY: ?Family History  ?Problem Relation Age of Onset  ? Cerebral aneurysm Father   ? Alcohol abuse Father   ? Neuropathy Mother   ? Heart disease Mother   ?     died at age 66  ? Migraines Brother   ?     several  ? Diabetes Maternal Uncle   ? CAD Brother   ?     CABG in his 41s  ? Colon cancer Neg Hx   ? Prostate cancer Neg Hx   ? Esophageal cancer Neg Hx   ? Stomach cancer Neg Hx   ? Rectal cancer Neg Hx   ? ? ?Objective:  ?Blood pressure 130/70, pulse 69, height 6' (1.829 m), weight 265 lb (120.2 kg), SpO2 98 %. ?General: No acute distress.  Patient appears well-groomed.   ?Head:  Normocephalic/atraumatic ?Eyes:  fundi examined but not visualized ?Neck: supple, no paraspinal tenderness, full range of motion ?Back: No paraspinal tenderness ?Heart: regular rate and rhythm ?Vascular: No carotid bruits. ?Neurological Exam: ?Mental status: alert and oriented to person, place, and time, recent and remote memory intact, fund of knowledge intact, attention and concentration intact, speech fluent and not dysarthric, language intact. ?Cranial nerves: ?CN I: not tested ?CN II: pupils equal, round and reactive to light, visual fields intact ?CN III, IV, VI:  full range of motion, no nystagmus, no ptosis ?CN V: facial sensation intact. ?CN VII: upper and lower face symmetric ?CN VIII: hearing intact ?CN IX, X: gag intact, uvula midline ?CN XI: sternocleidomastoid and trapezius  muscles intact ?CN XII: tongue midline ?Bulk & Tone: normal, no fasciculations. ?Motor:  muscle strength 5/5 throughout ?Sensation:  Pinprick and vibratory sensation slightly reduced in feet ?Deep Tendon Reflexes:  2+ throughout,  toes downgoing.   ?Finger to nose testing:  Without dysmetria.   ?Heel to shin:  Without dysmetria.   ?Gait:  Normal station and stride.  Romberg negative. ? ? ? ?Thank you for allowing me to take part in the care of this patient. ? ?Metta Clines,  DO ? ?CC: Kathlene November, MD ? ? ? ? ?

## 2021-09-11 ENCOUNTER — Other Ambulatory Visit: Payer: Self-pay | Admitting: Internal Medicine

## 2021-10-15 ENCOUNTER — Other Ambulatory Visit: Payer: Self-pay | Admitting: Internal Medicine

## 2021-11-03 ENCOUNTER — Encounter: Payer: Self-pay | Admitting: Internal Medicine

## 2021-11-12 ENCOUNTER — Encounter: Payer: Self-pay | Admitting: Internal Medicine

## 2021-11-12 ENCOUNTER — Ambulatory Visit: Payer: Medicare PPO | Admitting: Internal Medicine

## 2021-11-12 VITALS — BP 132/68 | HR 73 | Temp 98.3°F | Resp 18 | Ht 72.0 in | Wt 260.5 lb

## 2021-11-12 DIAGNOSIS — H0520 Unspecified exophthalmos: Secondary | ICD-10-CM

## 2021-11-12 DIAGNOSIS — E785 Hyperlipidemia, unspecified: Secondary | ICD-10-CM | POA: Diagnosis not present

## 2021-11-12 DIAGNOSIS — R519 Headache, unspecified: Secondary | ICD-10-CM | POA: Diagnosis not present

## 2021-11-12 DIAGNOSIS — R5383 Other fatigue: Secondary | ICD-10-CM

## 2021-11-12 DIAGNOSIS — I10 Essential (primary) hypertension: Secondary | ICD-10-CM | POA: Diagnosis not present

## 2021-11-12 MED ORDER — ASPIRIN 81 MG PO TBEC: 81.0000 mg | DELAYED_RELEASE_TABLET | Freq: Every day | ORAL | Status: DC

## 2021-11-12 NOTE — Assessment & Plan Note (Signed)
DM: Per Endo HTN: BP is very good, on amlodipine.  Check BMP High cholesterol: On Tricor, Crestor.  Check a FLP, AST, ALT. Head ache: C/O headache at the last visit,Sed rate normal, brain MRI no acute, (+) chronic small vessel ischemic changes, bilateral proptosis. Subsequently saw neurology, felt that he probably had a migraine. Aspirin: Due to documented small vessel disease on brain MRI recently I agree with neurology who recommended aspirin 81 mg daily.  Patient already started.   Proptosis: Reported by the MRI, over the last several years I have not seen any change on his eyes, neither has the patient.  He has no actual symptoms.  Recommend to discuss with Endo at the next opportunity noting that his TSH was normal the last time it was checked. Fatigue: As described above, likely multifactorial.  Observation Preventive care: COVID and flu shot recommended RTC 3 weeks for fasting labs RTC CPX 4 months

## 2021-11-12 NOTE — Progress Notes (Signed)
Subjective:    Patient ID: Ronald King, male    DOB: 1945/05/01, 76 y.o.   MRN: [id number]  DOS:  11/12/2021 Type of visit - description: Follow-up  Today we addressed his chronic medical problems. He was seen with headaches, work-up reviewed with the patient, overall headaches are much improved.  He did complain of fatigue on and off, good days and bad days, there are days he has plenty of energy he said. He denies fever chills.  No chest pain or difficulty breathing.  No lower extremity edema.  No weight loss.  No unusual aches or pains.   Review of Systems See above   Past Medical History:  Diagnosis Date   Allergy    Anemia    Anxiety    Blood transfusion without reported diagnosis    Chronic bronchitis    Chronic cough    Diabetes ([place]) 07-2011   per Dr Buddy Duty   DJD (degenerative joint disease)    Dyslipidemia    Esophageal polyp    GERD (gastroesophageal reflux disease)    s/p [employer] F.   Hemorrhoids, internal    History of gastrointestinal hemorrhage    Hypertension    Hypogonadism, male    per Dr Buddy Duty   Neuromuscular disorder [facility])    neuropathy feet   Neuropathy    per neuro-Dr Joselyn Glassman vicodin   OSA (obstructive sleep apnea)    on CPAP   Sleep apnea    CPAP at night   Vitamin D deficiency     Past Surgical History:  Procedure Laterality Date   ANAL FISSURE REPAIR     hand fracture surgery     left   KNEE ARTHROSCOPY  2007   Dr. Marlou Sa   LAPAROSCOPIC NISSEN FUNDOPLICATION  05/6642   Dr. Hassell Done   NOSE SURGERY     TONSILLECTOMY     UPPER GASTROINTESTINAL ENDOSCOPY      Current Outpatient Medications  Medication Instructions   amLODipine (NORVASC) 10 MG tablet TAKE 1 TABLET(10 MG) BY MOUTH DAILY   amoxicillin (AMOXIL) 500 mg, Oral, 3 times daily, As needed for bronchitis flares   ascorbic acid (VITAMIN C) 1,000 mg, Oral, Daily   aspirin EC 81 mg, Oral, Daily, Swallow whole.   Calcium Carbonate-Vitamin D 600-400 MG-UNIT tablet 1 tablet,  Oral, Daily,     clonazePAM (KLONOPIN) 1 mg, Oral, 2 times daily,     Coenzyme Q10 (CO Q 10 PO) 1 tablet, Oral, Daily   Cyanocobalamin (B-12) 2000 MCG TABS 1 tablet, Oral, Daily   fenofibrate (TRICOR) 145 MG tablet TAKE 1 TABLET(145 MG) BY MOUTH DAILY   HYDROcodone bit-homatropine (HYCODAN) 5-1.5 MG/5ML syrup 5 mLs, Oral, Every 6 hours PRN   lansoprazole (PREVACID) 30 MG capsule TAKE 1 CAPSULE(30 MG) BY MOUTH TWICE DAILY   Multiple Vitamins-Minerals (CENTRUM SILVER PO) 1 tablet, Oral, Daily, Reported on 03/17/2015   PARoxetine (PAXIL) 20 mg, Oral, Daily   pyridOXINE (VITAMIN B6) 100 mg, Oral, Daily   rosuvastatin (CRESTOR) 40 MG tablet TAKE 1 TABLET(40 MG) BY MOUTH AT BEDTIME   sucralfate (CARAFATE) 1 g tablet TAKE 1 TABLET(1 GRAM) BY MOUTH TWICE DAILY   Trulicity 0.34 mg, Subcutaneous, Weekly       Objective:   Physical Exam BP 132/68   Pulse 73   Temp 98.3 F (36.8 C) (Oral)   Resp 18   Ht 6' (1.829 m)   Wt 260 lb 8 oz (118.2 kg)   SpO2 94%   BMI 35.33 kg/m  General:   Well developed, NAD, BMI noted. HEENT:  Normocephalic . Face symmetric, atraumatic Lungs:  CTA B Normal respiratory effort, no intercostal retractions, no accessory muscle use. Heart: RRR, question of systolic murmur.  Lower extremities: no pretibial edema bilaterally  Skin: Not pale. Not jaundice Neurologic:  alert & oriented X3.  Speech normal, gait appropriate for age and unassisted Psych--  Cognition and judgment appear intact.  Cooperative with normal attention span and concentration.  Behavior appropriate. No anxious or depressed appearing.      Assessment     Assessment  ENDO: Dr Buddy Duty -DM - hypogonadism  HTN Dislipidemia NEURO: see note 08/08/2017 --+ Neuropathy  --RLS type of symptoms -- Headaches, seen neuro before, multiple meds including botox  Obesity, Anxiety -- paxil- clonazepam (per Dr Tye Savoy ) Vitamin D deficiency PULM:  --OSA, CPAP --Chronic cough  DJD, Dr. Marlou Sa GU:  Dr Erenest Rasher BPH, ED  , prostatitis  GI: --GERD, status post Nissen fundoplication --Esophageal polyp -- h/o GI bleed- tranfussion (90s)  PLAN: DM: Per Endo HTN: BP is very good, on amlodipine.  Check BMP High cholesterol: On Tricor, Crestor.  Check a FLP, AST, ALT. Head ache: C/O headache at the last visit,Sed rate normal, brain MRI no acute, (+) chronic small vessel ischemic changes, bilateral proptosis. Subsequently saw neurology, felt that he probably had a migraine. Aspirin: Due to documented small vessel disease on brain MRI recently I agree with neurology who recommended aspirin 81 mg daily.  Patient already started.   Proptosis: Reported by the MRI, over the last several years I have not seen any change on his eyes, neither has the patient.  He has no actual symptoms.  Recommend to discuss with Endo at the next opportunity noting that his TSH was normal the last time it was checked. Fatigue: As described above, likely multifactorial.  Observation Preventive care: COVID and flu shot recommended RTC 3 weeks for fasting labs RTC CPX 4 months

## 2021-11-12 NOTE — Patient Instructions (Addendum)
Recommend to proceed with the following vaccines at your pharmacy:  Covid booster (bivalent) Flu shot this fall   The next time you see the endocrinologist, please talk about your "bulging" eyes.  Continue checking your blood pressures regularly BP GOAL is between 110/65 and  135/85. If it is consistently higher or lower, let me know Delete  [place], [place] back for blood work, fasting, in 3 weeks  Come back for a physical exam in 4 months   Per our records you are due for your diabetic eye exam. Please contact your eye doctor to schedule an appointment. Please have them send copies of your office visit notes to [place]. Our fax number is [phone]. If you need a referral to an eye doctor please let us know.

## 2021-12-03 ENCOUNTER — Other Ambulatory Visit (INDEPENDENT_AMBULATORY_CARE_PROVIDER_SITE_OTHER): Payer: Medicare PPO

## 2021-12-03 DIAGNOSIS — I10 Essential (primary) hypertension: Secondary | ICD-10-CM | POA: Diagnosis not present

## 2021-12-03 DIAGNOSIS — E785 Hyperlipidemia, unspecified: Secondary | ICD-10-CM | POA: Diagnosis not present

## 2021-12-03 LAB — BASIC METABOLIC PANEL
BUN: 16 mg/dL (ref 6–23)
CO2: 31 mEq/L (ref 19–32)
Calcium: 9.1 mg/dL (ref 8.4–10.5)
Chloride: 105 mEq/L (ref 96–112)
Creatinine, Ser: 1.14 mg/dL (ref 0.40–1.50)
GFR: 63.04 mL/min (ref >60.00)
Glucose, Bld: 101 mg/dL — ABNORMAL HIGH (ref 70–99)
Potassium: 4 mEq/L (ref 3.5–5.1)
Sodium: 142 mEq/L (ref 135–145)

## 2021-12-03 LAB — AST: AST: 17 U/L (ref 0–37)

## 2021-12-03 LAB — LIPID PANEL
Cholesterol: 136 mg/dL (ref 0–200)
HDL: 36.3 mg/dL — ABNORMAL LOW (ref >39.00)
LDL Cholesterol: 84 mg/dL (ref 0–99)
NonHDL: 99.76
Total CHOL/HDL Ratio: 4
Triglycerides: 79 mg/dL (ref 0.0–149.0)
VLDL: 15.8 mg/dL (ref 0.0–40.0)

## 2021-12-03 LAB — ALT: ALT: 13 U/L (ref 0–53)

## 2021-12-16 DIAGNOSIS — R208 Other disturbances of skin sensation: Secondary | ICD-10-CM | POA: Diagnosis not present

## 2021-12-16 DIAGNOSIS — Z85828 Personal history of other malignant neoplasm of skin: Secondary | ICD-10-CM | POA: Diagnosis not present

## 2021-12-16 DIAGNOSIS — D2371 Other benign neoplasm of skin of right lower limb, including hip: Secondary | ICD-10-CM | POA: Diagnosis not present

## 2021-12-16 DIAGNOSIS — D485 Neoplasm of uncertain behavior of skin: Secondary | ICD-10-CM | POA: Diagnosis not present

## 2021-12-16 DIAGNOSIS — L570 Actinic keratosis: Secondary | ICD-10-CM | POA: Diagnosis not present

## 2021-12-16 DIAGNOSIS — Z08 Encounter for follow-up examination after completed treatment for malignant neoplasm: Secondary | ICD-10-CM | POA: Diagnosis not present

## 2021-12-18 ENCOUNTER — Ambulatory Visit: Payer: Medicare PPO | Admitting: Neurology

## 2021-12-29 ENCOUNTER — Telehealth: Payer: Self-pay | Admitting: Pulmonary Disease

## 2021-12-29 NOTE — Telephone Encounter (Signed)
Okay to schedule an afternoon appointment with me in [place] office this week.

## 2021-12-29 NOTE — Telephone Encounter (Signed)
Pt is needing follow-up appt but no available appt in [place] or [place].  Wanted to discuss with Dr. Halford Chessman about meds. Offered appt with APP but would prefer to see Dr. Halford Chessman since he is aware of hx when seeing Dr. Lenna Gilford in the past as well.    Dr. Halford Chessman please advise, this is a [place] patient.

## 2021-12-29 NOTE — Telephone Encounter (Signed)
Scheduled patient for appt in [facility] office Thursday 10/4. Nothing further needed

## 2021-12-31 ENCOUNTER — Encounter: Payer: Self-pay | Admitting: Pulmonary Disease

## 2021-12-31 ENCOUNTER — Ambulatory Visit: Payer: Medicare PPO | Admitting: Pulmonary Disease

## 2021-12-31 DIAGNOSIS — R053 Chronic cough: Secondary | ICD-10-CM | POA: Diagnosis not present

## 2021-12-31 MED ORDER — HYDROCODONE BIT-HOMATROP MBR 5-1.5 MG/5ML PO SOLN: 5.0000 mL | Freq: Four times a day (QID) | ORAL | 0 refills | Status: DC | PRN

## 2021-12-31 MED ORDER — AMOXICILLIN 500 MG PO TABS: 500.0000 mg | ORAL_TABLET | Freq: Three times a day (TID) | ORAL | 1 refills | Status: DC

## 2021-12-31 NOTE — Progress Notes (Signed)
[facility]  Chief Complaint  Patient presents with   Follow-up    Patient wants to discuss recent health concerns that are affecting his sleep.     Constitutional:  BP 134/76 (BP Location: Left Arm, Patient Position: Sitting)   Pulse 84   Temp 97.6 F (36.4 C) (Temporal)   Ht 6' (1.829 m)   Wt 259 lb 3.2 oz (117.6 kg)   SpO2 97% Comment: ra  BMI 35.15 kg/m   Past Medical History:  HTN, HLD, GERD, IBS, hemorrhoids, BPH, ED, DJD, HA, Vit D deficiency, Neuropathy, Anxiety  Past Surgical History:  He  has a past surgical history that includes Laparoscopic Nissen fundoplication (10/2991); Knee arthroscopy (2007); Tonsillectomy; Nose surgery; Anal fissure repair; hand fracture surgery; and Upper gastrointestinal endoscopy.  Brief Summary:  Ronald King is a 76 y.o. male former smoker with cough and obstructive sleep apnea.  Former patient of Dr. Lenna Gilford.      Subjective:   He was having headaches and vertigo.  MRI brain from 07/15/21 showed moderate chronic small vessel ischemic changes in cerebral white matter.  These have improved.  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  He gets bronchitis a couple times per month.  Knocks it out with cough syrup and couple pills of amoxicillin.  Sinuses okay.  He has more stress and this is causing trouble with his sleep.  He is planning to simplify his life.  He got a new CPAP mask.  This is fitting better.  No issues with pressure setting.  Physical Exam:   Appearance - well kempt   ENMT - no sinus tenderness, no oral exudate, no LAN, Mallampati 3 airway, no stridor  Respiratory - equal breath sounds bilaterally, no wheezing or rales  CV - s1s2 regular rate and rhythm, 2/6 SM  Ext - no clubbing, no edema  Skin - no rashes  Psych - normal mood and affect      Pulmonary testing:  Spirometry September 2013 >> FEV 1 3.46 (86%), FEV1% 87 RAST  11/15/18 >> grasses, IgE 87 PFT 12/25/18 >> FEV1 3.13 (91%), FEV1% 86, TLC 6.14 (82%), DLCO 103%, +BD from FEF 25-75  Sleep Tests:  PSG 09/02/97 >> AHI 3 CPAP 09/24/21 to 12/22/21 >> used on 87 of 90 nights with average 7 hrs 40 min.  Average AHI 5.7 with CPAP 16 cm H2O   Cardiac Tests:  Echo 01/29/21 >> EF 55 to 60%, aortic root 36 mm  Social History:  He  reports that he quit smoking about 56 years ago. His smoking use included cigarettes. He has a 3.00 pack-year smoking history. He has never used smokeless tobacco. He reports that he does not drink alcohol and does not use drugs.  Family History:  His family history includes Alcohol abuse in his father; CAD in his brother; Cerebral aneurysm in his father; Diabetes in his maternal uncle; Heart disease in his mother; Migraines in his brother; Neuropathy in his mother.     Assessment/Plan:   Chronic cough. 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 - previously seen by Dr. Janace Hoard with ENT - nasal steroids sprays reported to be ineffective for him - prn afrin and nasal irrigation - advised him to limit use of afrin   Sleep apnea, NOS. - he is compliant with CPAP and reports benefit - uses [facility] for his DME - continue CPAP 16 cm H2O   GERD. - followed by Dr. Silverio Decamp with GI  Time Spent Involved in Patient Care on Day of Examination:  27 minutes  Follow up:   Patient Instructions  Follow up in 6 months  Medication List:   Allergies as of 12/31/2021        Reactions   Montelukast Shortness Of Breath   Ceftin [cefuroxime Axetil]    Duloxetine    Other reaction(s): Other (See Comments) refuses   Lyrica [pregabalin] Other (See Comments)   Drowsiness   Metformin Hcl    Other reaction(s): Unknown   Neurontin [gabapentin] Other (See Comments)   Drowsiness    Testosterone Other (See Comments)   HTN        Medication List        Accurate as of December 31, 2021 11:09 AM. If you have any questions, ask your nurse or doctor.          amLODipine 10 MG tablet Commonly known as: NORVASC TAKE 1 TABLET(10 MG) BY MOUTH DAILY   amoxicillin 500 MG tablet Commonly known as: AMOXIL Take 1 tablet (500 mg total) by mouth 3 (three) times daily. As needed for bronchitis flares   ascorbic acid 500 MG tablet Commonly known as: VITAMIN C Take 1,000 mg by mouth daily.   aspirin EC 81 MG tablet Take 1 tablet (81 mg total) by mouth daily. Swallow whole.   B-12 2000 MCG Tabs Take 1 tablet by mouth daily.   Calcium Carbonate-Vitamin D 600-400 MG-UNIT tablet Take 1 tablet by mouth daily.   CENTRUM SILVER PO Take 1 tablet by mouth daily. Reported on 03/17/2015   clonazePAM 1 MG tablet Commonly known as: KLONOPIN Take 1 mg by mouth 2 (two) times daily.   CO Q 10 PO Take 1 tablet by mouth daily.   fenofibrate 145 MG tablet Commonly known as: TRICOR TAKE 1 TABLET(145 MG) BY MOUTH DAILY   HYDROcodone bit-homatropine 5-1.5 MG/5ML syrup Commonly known as: HYCODAN Take 5 mLs by mouth every 6 (six) hours as needed for cough.   lansoprazole 30 MG capsule Commonly known as: PREVACID TAKE 1 CAPSULE(30 MG) BY MOUTH TWICE DAILY   PARoxetine 20 MG tablet Commonly known as: PAXIL Take 20 mg by mouth daily.   pyridOXINE 100 MG tablet Commonly known as: VITAMIN B6 Take 100 mg by mouth daily.   rosuvastatin 40 MG tablet Commonly known as: CRESTOR TAKE 1 TABLET(40 MG) BY MOUTH AT BEDTIME   sucralfate 1 g tablet Commonly known as:  CARAFATE TAKE 1 TABLET(1 GRAM) BY MOUTH TWICE DAILY What changed: See the new instructions.   Trulicity 6.29 BM/8.4XL Sopn Generic drug: Dulaglutide Inject 0.75 mg into the skin once a week.        Signature:  Chesley Mires, MD [place] Pager - [phone] 12/31/2021, 11:09 AM

## 2021-12-31 NOTE — Patient Instructions (Signed)
Follow up in 6 months 

## 2022-01-15 ENCOUNTER — Encounter: Payer: Self-pay | Admitting: Gastroenterology

## 2022-01-20 ENCOUNTER — Other Ambulatory Visit: Payer: Self-pay | Admitting: Gastroenterology

## 2022-01-21 DIAGNOSIS — E119 Type 2 diabetes mellitus without complications: Secondary | ICD-10-CM | POA: Diagnosis not present

## 2022-01-21 DIAGNOSIS — E230 Hypopituitarism: Secondary | ICD-10-CM | POA: Diagnosis not present

## 2022-01-21 LAB — HEMOGLOBIN A1C: Hemoglobin A1C: 6.3

## 2022-03-08 DIAGNOSIS — H2513 Age-related nuclear cataract, bilateral: Secondary | ICD-10-CM | POA: Diagnosis not present

## 2022-03-08 DIAGNOSIS — H18413 Arcus senilis, bilateral: Secondary | ICD-10-CM | POA: Diagnosis not present

## 2022-03-08 DIAGNOSIS — E119 Type 2 diabetes mellitus without complications: Secondary | ICD-10-CM | POA: Diagnosis not present

## 2022-03-08 DIAGNOSIS — H11153 Pinguecula, bilateral: Secondary | ICD-10-CM | POA: Diagnosis not present

## 2022-03-08 DIAGNOSIS — H40033 Anatomical narrow angle, bilateral: Secondary | ICD-10-CM | POA: Diagnosis not present

## 2022-03-08 LAB — HM DIABETES EYE EXAM

## 2022-03-11 DIAGNOSIS — R972 Elevated prostate specific antigen [PSA]: Secondary | ICD-10-CM | POA: Diagnosis not present

## 2022-03-11 LAB — PSA: PSA: 4.85

## 2022-03-13 ENCOUNTER — Other Ambulatory Visit: Payer: Self-pay | Admitting: Internal Medicine

## 2022-03-19 ENCOUNTER — Other Ambulatory Visit: Payer: Self-pay | Admitting: Internal Medicine

## 2022-03-25 DIAGNOSIS — R351 Nocturia: Secondary | ICD-10-CM | POA: Diagnosis not present

## 2022-03-25 DIAGNOSIS — N401 Enlarged prostate with lower urinary tract symptoms: Secondary | ICD-10-CM | POA: Diagnosis not present

## 2022-03-25 DIAGNOSIS — R972 Elevated prostate specific antigen [PSA]: Secondary | ICD-10-CM | POA: Diagnosis not present

## 2022-03-26 ENCOUNTER — Encounter: Payer: Self-pay | Admitting: Internal Medicine

## 2022-03-27 ENCOUNTER — Emergency Department (HOSPITAL_BASED_OUTPATIENT_CLINIC_OR_DEPARTMENT_OTHER)
Admission: EM | Admit: 2022-03-27 | Discharge: 2022-03-27 | Disposition: A | Discharge status: Home / Self Care | Payer: Medicare PPO | Attending: Emergency Medicine | Admitting: Emergency Medicine

## 2022-03-27 ENCOUNTER — Encounter (HOSPITAL_BASED_OUTPATIENT_CLINIC_OR_DEPARTMENT_OTHER): Payer: Self-pay | Admitting: Emergency Medicine

## 2022-03-27 ENCOUNTER — Emergency Department (HOSPITAL_BASED_OUTPATIENT_CLINIC_OR_DEPARTMENT_OTHER): Payer: Medicare PPO

## 2022-03-27 DIAGNOSIS — S92425A Nondisplaced fracture of distal phalanx of left great toe, initial encounter for closed fracture: Secondary | ICD-10-CM

## 2022-03-27 DIAGNOSIS — S92415A Nondisplaced fracture of proximal phalanx of left great toe, initial encounter for closed fracture: Secondary | ICD-10-CM | POA: Diagnosis not present

## 2022-03-27 DIAGNOSIS — W109XXA Fall (on) (from) unspecified stairs and steps, initial encounter: Secondary | ICD-10-CM | POA: Insufficient documentation

## 2022-03-27 DIAGNOSIS — M778 Other enthesopathies, not elsewhere classified: Secondary | ICD-10-CM | POA: Diagnosis not present

## 2022-03-27 MED ORDER — CEPHALEXIN 500 MG PO CAPS: 500.0000 mg | ORAL_CAPSULE | Freq: Two times a day (BID) | ORAL | 0 refills | Status: DC

## 2022-03-27 NOTE — Discharge Instructions (Signed)
You are seen today for right great toe injury.  You do have an underlying fracture.  We will immobilize the toe with a postoperative shoe or a fracture boot depending on what will fit you better.  You need to follow-up with podiatry.  [facility] is in this area as well as [facility].  He may also follow-up with your normal podiatrist. Will prescribe Keflex because of your history of neuropathy and large hematoma to attempted prevent infection.  If you begin to have any redness or swelling need to return for further care and management. Thank for the opportunity to participate in your care, please have your injury reassessed by your PCP within 48 hours.

## 2022-03-27 NOTE — ED Triage Notes (Addendum)
Pt w/ injury to LT great toe last night; sts he jammed it on something when he was going down steps in the garage; large hematoma/blister noted to top of toe; purplish discoloration to toe

## 2022-03-27 NOTE — ED Provider Notes (Signed)
[facility] EMERGENCY DEPARTMENT Provider Note   CSN: [id number] Arrival date & time: 03/27/22  1609     History Chief Complaint  Patient presents with   Toe Injury    HPI DOCK BACCAM is a 76 y.o. male presenting for chief complaint of left foot great toe injury.  He states that he was walking down a step yesterday when he lost his balance and his foot slid into the wall.  He did fall over backwards but denies any other injuries denies fevers or chills nausea vomiting syncope shortness of breath.  He was immediately able to get up.  Has a large blister to his left great toe.  No medications prior to arrival.  Not on any blood thinners.  Denies any headache or other symptoms..   Patient's recorded medical, surgical, social, medication list and allergies were reviewed in the Snapshot window as part of the initial history.   Review of Systems   Review of Systems  Constitutional:  Negative for chills and fever.  HENT:  Negative for ear pain and sore throat.   Eyes:  Negative for pain and visual disturbance.  Respiratory:  Negative for cough and shortness of breath.   Cardiovascular:  Negative for chest pain and palpitations.  Gastrointestinal:  Negative for abdominal pain and vomiting.  Genitourinary:  Negative for dysuria and hematuria.  Musculoskeletal:  Negative for arthralgias and back pain.  Skin:  Negative for color change and rash.  Neurological:  Negative for seizures and syncope.  All other systems reviewed and are negative.   Physical Exam Updated Vital Signs BP 133/76   Pulse 86   Temp 98 F (36.7 C) (Oral)   Resp 18   Ht '6\' 1"'$  (1.854 m)   Wt 116.6 kg   SpO2 96%   BMI 33.91 kg/m  Physical Exam Vitals and nursing note reviewed.  Constitutional:      General: He is not in acute distress.    Appearance: He is well-developed.  HENT:     Head: Normocephalic and atraumatic.  Eyes:     Conjunctiva/sclera: Conjunctivae normal.   Cardiovascular:     Rate and Rhythm: Normal rate and regular rhythm.     Heart sounds: No murmur heard. Pulmonary:     Effort: Pulmonary effort is normal. No respiratory distress.     Breath sounds: Normal breath sounds.  Abdominal:     Palpations: Abdomen is soft.     Tenderness: There is no abdominal tenderness.  Musculoskeletal:        General: Deformity (Obvious deformity and swelling to left foot great toe.  Large hematoma.) present. No swelling.     Cervical back: Neck supple.  Skin:    General: Skin is warm and dry.     Capillary Refill: Capillary refill takes less than 2 seconds.  Neurological:     Mental Status: He is alert.  Psychiatric:        Mood and Affect: Mood normal.      ED Course/ Medical Decision Making/ A&P    Procedures Procedures   Medications Ordered in ED Medications - No data to display  Medical Decision Making:    MARKCUS LAZENBY is a 76 y.o. male who presented to the ED today with left foot injury detailed above.     Patient's presentation is complicated by their history of multiple comorbid medical problems including diabetes with neuropathy.  Patient placed on continuous vitals and telemetry monitoring while in ED which was  reviewed periodically.   Complete initial physical exam performed, notably the patient  was hemodynamically stable in no acute distress.  Obvious deformity to the left great toe.      Reviewed and confirmed nursing documentation for past medical history, family history, social history.    Initial Assessment:   Patient history present on his physicals and findings are most consistent with fracture and developing hematoma.  It is neurovascularly intact though he does have neuropathy.  Will immobilize with a postoperative shoe or fracture boot depending on what will fit the patient better.  Given the large size of the hematoma it would likely rupture spontaneously.  Want to reduce chance of skin penetration therefore  will wrap with gauze, prescribed Keflex and recommend patient be assessed by a podiatrist in the outpatient setting within 48 hours. Patient is high risk for progression to diabetic foot ulcer or other complication if incision and drainage is done in the emergency department at this time.  Supportive care reinforced, immobilization reinforced and strict return precautions reinforced and patient otherwise stable for outpatient care management at this time. Clinical Impression:  1. Nondisplaced fracture of distal phalanx of right great toe, initial encounter for closed fracture      Data Unavailable   Final Clinical Impression(s) / ED Diagnoses Final diagnoses:  Nondisplaced fracture of distal phalanx of right great toe, initial encounter for closed fracture    Rx / DC Orders ED Discharge Orders          Ordered    cephALEXin (KEFLEX) 500 MG capsule  2 times daily        03/27/22 1747              Tretha Sciara, MD 03/27/22 1752

## 2022-03-30 ENCOUNTER — Ambulatory Visit: Payer: Medicare PPO | Admitting: Podiatry

## 2022-03-30 DIAGNOSIS — T148XXA Other injury of unspecified body region, initial encounter: Secondary | ICD-10-CM

## 2022-03-30 DIAGNOSIS — L97521 Non-pressure chronic ulcer of other part of left foot limited to breakdown of skin: Secondary | ICD-10-CM

## 2022-03-30 DIAGNOSIS — G629 Polyneuropathy, unspecified: Secondary | ICD-10-CM | POA: Diagnosis not present

## 2022-03-30 DIAGNOSIS — S92415A Nondisplaced fracture of proximal phalanx of left great toe, initial encounter for closed fracture: Secondary | ICD-10-CM

## 2022-03-30 NOTE — Progress Notes (Unsigned)
Subjective:  Patient ID: Ronald King, male    DOB: 1945/07/31,  MRN: [id number]  Chief Complaint  Patient presents with   Foot Problem    Left foot Great to had blood blister, Diabetic.    77 y.o. male presents with concern for blood blister that has developed over the left great toe and great toe joint.  This happened within the past week after he had an injury to the left great toe.  Patient does have a history of type 2 diabetes.  He was seen in emergency department who took x-rays told he had a fracture of the left great toe and prescribed antibiotic cephalexin 500 mg.  He still has some of these antibiotics left and has been taking them.  He is also been wearing a postoperative shoe that was given in the emergency department.  He says that the blood blister popped and there was some red fluid that leaked out.  He does have some pain with the area however not severe due to neuropathy.  Denies any nausea vomiting fever or chills.  Past Medical History:  Diagnosis Date   Allergy    Anemia    Anxiety    Blood transfusion without reported diagnosis    Chronic bronchitis    Chronic cough    Diabetes ([place]) 07-2011   per Dr Buddy Duty   DJD (degenerative joint disease)    Dyslipidemia    Esophageal polyp    GERD (gastroesophageal reflux disease)    s/p [employer] F.   Hemorrhoids, internal    History of gastrointestinal hemorrhage    Hypertension    Hypogonadism, male    per Dr Buddy Duty   Neuromuscular disorder [facility])    neuropathy feet   Neuropathy    per neuro-Dr Joselyn Glassman vicodin   OSA (obstructive sleep apnea)    on CPAP   Sleep apnea    CPAP at night   Vitamin D deficiency     Allergies  Allergen Reactions   Montelukast Shortness Of Breath   Ceftin [Cefuroxime Axetil]    Duloxetine     Other reaction(s): Other (See Comments) refuses   Lyrica [Pregabalin] Other (See Comments)    Drowsiness   Metformin Hcl     Other reaction(s): Unknown   Neurontin [Gabapentin] Other  (See Comments)    Drowsiness    Testosterone Other (See Comments)    HTN    ROS: Negative except as per HPI above  Objective:  General: AAO x3, NAD  Dermatological: Sanginous bullae noted left foot dorsal to the left hallux send to be MPJ level.  There is necrotic skin with some residual serosanguineous fluid present.  There is mild tenderness with palpation of the area however not severe due to neuropathy.  Postdebridement there is a superficial ulceration limited to breakdown of skin overlying the left hallux approximately 4 cm x 2 cm x 0.1 cm with raw erythematous skin underlying however erythema does not extend to the rest of the foot at all.       Vascular:  Dorsalis Pedis artery and Posterior Tibial artery pedal pulses are 2/4 bilateral.  Capillary fill time < 3 sec to all digits.   Neruologic: Grossly diminished to light touch to the area of the blister and wound postdebridement.  Musculoskeletal: No gross boney pedal deformities bilateral. No pain, crepitus, or limitation noted with foot and ankle range of motion bilateral. Muscular strength 5/5 in all groups tested bilateral.  Gait: Unassisted, Nonantalgic.   No images are  attached to the encounter.  Radiographs: taken at ED Date: 03/27/22 XR the left foot Weightbearing AP/Lateral/Oblique   Findings: Acute nondisplaced extra-articular fracture of the proximal phalanx of the great toe. Assessment:   1. Blood blister   2. Ulcer of left foot, limited to breakdown of skin (HCC)   3. Closed nondisplaced fracture of proximal phalanx of left great toe, initial encounter   4. Neuropathy      Plan:  Patient was evaluated and treated and all questions answered.  # Blood blister status post bedside debridement with underlying ulcer limited to breakdown of skin at the dorsal left hallux and first MPJ area. -We discussed the etiology and factors that are a part of the wound healing process.  We also discussed the risk of  infection both soft tissue and osteomyelitis from open ulceration.  Discussed the risk of limb loss if this happens or worsens. -Debridement as below. -Dressed with betadine and non adherent gauze, DSD. -Continue home dressing changes daily with Betadine and dry sterile gauze -Continue off-loading with surgical shoe. -Vascular testing deferred as patient is palpable pulses -HgbA1c: 7.3 -Last antibiotics: Continue cephalexin 500 mg 3 times daily until completed and then no further antibiotics indicated no current signs of infection wound is clean and healthy -Imaging: x-ray reviewed, shows fracture of the proximal phalanx without displacement no evidence of osseous erosion to indicate osteomyelitis.  Procedure: Excisional Debridement of Wound Rationale: Removal of non-viable soft tissue from the wound to promote healing.  Anesthesia: none Post-Debridement Wound Measurements: 4 cm x 2 cm x 0.1 cm  Type of Debridement: Sharp Excisional Tissue Removed: Non-viable soft tissue Depth of Debridement: subcutaneous tissue. Technique: Sharp excisional debridement to bleeding, viable wound base.  Dressing: Dry, sterile, compression dressing. Disposition: Patient tolerated procedure well.   #fracture of proximal phalanx Left hallux -Discussed that the fracture of the proximal phalanx which caused a blister to develop should heal well with conservative management. -Recommend weightbearing as tolerated in a post operative shoe but should decrease his weightbearing is much as he is able to.   Return in about 2 weeks (around 04/13/2022) for Follow-up left foot blister.           Everitt Amber, DPM [facility]

## 2022-04-01 ENCOUNTER — Encounter: Payer: Medicare PPO | Admitting: Internal Medicine

## 2022-04-16 ENCOUNTER — Ambulatory Visit (INDEPENDENT_AMBULATORY_CARE_PROVIDER_SITE_OTHER): Payer: Medicare PPO | Admitting: Podiatry

## 2022-04-16 DIAGNOSIS — T148XXA Other injury of unspecified body region, initial encounter: Secondary | ICD-10-CM

## 2022-04-16 DIAGNOSIS — S92415A Nondisplaced fracture of proximal phalanx of left great toe, initial encounter for closed fracture: Secondary | ICD-10-CM | POA: Diagnosis not present

## 2022-04-16 DIAGNOSIS — G629 Polyneuropathy, unspecified: Secondary | ICD-10-CM

## 2022-04-16 DIAGNOSIS — L97521 Non-pressure chronic ulcer of other part of left foot limited to breakdown of skin: Secondary | ICD-10-CM | POA: Diagnosis not present

## 2022-04-16 NOTE — Progress Notes (Signed)
Subjective:  Patient ID: Ronald King, male    DOB: November 16, 1945,  MRN: [id number]  Chief Complaint  Patient presents with   Blister    L foot Great to had blood blister, Diabetic - looks much better    77 y.o. male presents for follow-up of the left great toe blood blister.  He has been doing Betadine and dry gauze dressings since last appointment.  States the area is healed up very nicely.  Will denies pain at this time.  Has kept the foot dry not showering has been wearing postop shoe as instructed.  Past Medical History:  Diagnosis Date   Allergy    Anemia    Anxiety    Blood transfusion without reported diagnosis    Chronic bronchitis    Chronic cough    Diabetes ([place]) 07-2011   per Dr Buddy Duty   DJD (degenerative joint disease)    Dyslipidemia    Esophageal polyp    GERD (gastroesophageal reflux disease)    s/p [employer]   Hemorrhoids, internal    History of gastrointestinal hemorrhage    Hypertension    Hypogonadism, male    per Dr Buddy Duty   Neuromuscular disorder [facility])    neuropathy feet   Neuropathy    per neuro-Dr Joselyn Glassman vicodin   OSA (obstructive sleep apnea)    on CPAP   Sleep apnea    CPAP at night   Vitamin D deficiency     Allergies  Allergen Reactions   Montelukast Shortness Of Breath   Ceftin [Cefuroxime Axetil]    Duloxetine     Other reaction(s): Other (See Comments) refuses   Lyrica [Pregabalin] Other (See Comments)    Drowsiness   Metformin Hcl     Other reaction(s): Unknown   Neurontin [Gabapentin] Other (See Comments)    Drowsiness    Testosterone Other (See Comments)    HTN    ROS: Negative except as per HPI above  Objective:  General: AAO x3, NAD  Dermatological: At the dorsal aspect of the left hallux the ulceration is now fully healed with healthy pink epithelium growing and area is dry with no maceration malodor drainage erythema.   Vascular:  Dorsalis Pedis artery and Posterior Tibial artery pedal pulses are 2/4  bilateral.  Capillary fill time < 3 sec to all digits.   Neruologic: Grossly diminished to light touch to the area of the blister and wound postdebridement.  Musculoskeletal: No gross boney pedal deformities bilateral. No pain, crepitus, or limitation noted with foot and ankle range of motion bilateral. Muscular strength 5/5 in all groups tested bilateral.  Gait: Unassisted, Nonantalgic.   No images are attached to the encounter.  Radiographs: taken at ED Date: 03/27/22 XR the left foot Weightbearing AP/Lateral/Oblique   Findings: Acute nondisplaced extra-articular fracture of the proximal phalanx of the great toe. Assessment:   1. Blood blister   2. Ulcer of left foot, limited to breakdown of skin (HCC)   3. Closed nondisplaced fracture of proximal phalanx of left great toe, initial encounter   4. Neuropathy       Plan:  Patient was evaluated and treated and all questions answered.  # Blood blister status post bedside debridement with underlying ulcer limited to breakdown of skin at the dorsal left hallux and first MPJ area. -Healed after local wound care -Discussed with the patient that the blood blister has fully healed at this time there is new healthy pink epithelium no evidence of open wound at this  time -No wound care is needed at this time as it is fully healed patient can wash the foot in the shower as desired -Monitor for redness swelling drainage or any opening of the wound however there is none present at this time   #fracture of proximal phalanx Left hallux -Continue with postoperative shoe for 2 more weeks and then transition back to regular shoes -Discussed that the patient has pain after he gets back to his regular shoes in a couple of weeks to call and we can get back in for another appointment otherwise I believe the issue will be self-limiting   Return if symptoms worsen or fail to improve.           Everitt Amber, DPM [facility] /  [facility]

## 2022-04-17 ENCOUNTER — Other Ambulatory Visit: Payer: Self-pay | Admitting: Internal Medicine

## 2022-04-19 ENCOUNTER — Encounter: Payer: Self-pay | Admitting: Internal Medicine

## 2022-04-20 ENCOUNTER — Encounter: Payer: Medicare PPO | Admitting: Internal Medicine

## 2022-04-22 ENCOUNTER — Encounter: Payer: Self-pay | Admitting: Internal Medicine

## 2022-05-06 ENCOUNTER — Encounter: Payer: Self-pay | Admitting: Internal Medicine

## 2022-05-27 ENCOUNTER — Ambulatory Visit (INDEPENDENT_AMBULATORY_CARE_PROVIDER_SITE_OTHER): Payer: Medicare PPO | Admitting: *Deleted

## 2022-05-27 ENCOUNTER — Encounter: Payer: Self-pay | Admitting: Internal Medicine

## 2022-05-27 DIAGNOSIS — Z0000 Encounter for general adult medical examination without abnormal findings: Secondary | ICD-10-CM

## 2022-05-27 NOTE — Patient Instructions (Signed)
Ronald King , Thank you for taking time to come for your Medicare Wellness Visit. I appreciate your ongoing commitment to your health goals. Please review the following plan we discussed and let me know if I can assist you in the future.     This is a list of the screening recommended for you and due dates:  Health Maintenance  Topic Date Due   Eye exam for diabetics  10/23/2021   Colon Cancer Screening  11/07/2021   COVID-19 Vaccine (5 - 2023-24 season) 11/27/2021   Flu Shot  06/27/2022*   Hemoglobin A1C  07/23/2022   Yearly kidney health urinalysis for diabetes  07/24/2022   Complete foot exam   07/24/2022   Yearly kidney function blood test for diabetes  12/04/2022   Medicare Annual Wellness Visit  05/27/2023   DTaP/Tdap/Td vaccine (3 - Td or Tdap) 09/23/2025   Pneumonia Vaccine  Completed   Hepatitis C Screening: USPSTF Recommendation to screen - Ages 76-79 yo.  Completed   Zoster (Shingles) Vaccine  Completed   HPV Vaccine  Aged Out  *Topic was postponed. The date shown is not the original due date.     Next appointment: Follow up in one year for your annual wellness visit.   Preventive Care 76 Years and Older, Male Preventive care refers to lifestyle choices and visits with your health care provider that can promote health and wellness. What does preventive care include? A yearly physical exam. This is also called an annual well check. Dental exams once or twice a year. Routine eye exams. Ask your health care provider how often you should have your eyes checked. Personal lifestyle choices, including: Daily care of your teeth and gums. Regular physical activity. Eating a healthy diet. Avoiding tobacco and drug use. Limiting alcohol use. Practicing safe sex. Taking low doses of aspirin every day. Taking vitamin and mineral supplements as recommended by your health care provider. What happens during an annual well check? The services and screenings done by your  health care provider during your annual well check will depend on your age, overall health, lifestyle risk factors, and family history of disease. Counseling  Your health care provider may ask you questions about your: Alcohol use. Tobacco use. Drug use. Emotional well-being. Home and relationship well-being. Sexual activity. Eating habits. History of falls. Memory and ability to understand (cognition). Work and work Statistician. Screening  You may have the following tests or measurements: Height, weight, and BMI. Blood pressure. Lipid and cholesterol levels. These may be checked every 5 years, or more frequently if you are over 69 years old. Skin check. Lung cancer screening. You may have this screening every year starting at age 76 if you have a 30-pack-year history of smoking and currently smoke or have quit within the past 15 years. Fecal occult blood test (FOBT) of the stool. You may have this test every year starting at age 17. Flexible sigmoidoscopy or colonoscopy. You may have a sigmoidoscopy every 5 years or a colonoscopy every 10 years starting at age 19. Prostate cancer screening. Recommendations will vary depending on your family history and other risks. Hepatitis C blood test. Hepatitis B blood test. Sexually transmitted disease (STD) testing. Diabetes screening. This is done by checking your blood sugar (glucose) after you have not eaten for a while (fasting). You may have this done every 1-3 years. Abdominal aortic aneurysm (AAA) screening. You may need this if you are a current or former smoker. Osteoporosis. You may be screened starting  at age 69 if you are at high risk. Talk with your health care provider about your test results, treatment options, and if necessary, the need for more tests. Vaccines  Your health care provider may recommend certain vaccines, such as: Influenza vaccine. This is recommended every year. Tetanus, diphtheria, and acellular pertussis  (Tdap, Td) vaccine. You may need a Td booster every 10 years. Zoster vaccine. You may need this after age 61. Pneumococcal 13-valent conjugate (PCV13) vaccine. One dose is recommended after age 74. Pneumococcal polysaccharide (PPSV23) vaccine. One dose is recommended after age 60. Talk to your health care provider about which screenings and vaccines you need and how often you need them. This information is not intended to replace advice given to you by your health care provider. Make sure you discuss any questions you have with your health care provider. Document Released: 04/11/2015 Document Revised: 12/03/2015 Document Reviewed: 01/14/2015 Elsevier Interactive Patient Education  2017 [place] Prevention in the Home Falls can cause injuries. They can happen to people of all ages. There are many things you can do to make your home safe and to help prevent falls. What can I do on the outside of my home? Regularly fix the edges of walkways and driveways and fix any cracks. Remove anything that might make you trip as you walk through a door, such as a raised step or threshold. Trim any bushes or trees on the path to your home. Use bright outdoor lighting. Clear any walking paths of anything that might make someone trip, such as rocks or tools. Regularly check to see if handrails are loose or broken. Make sure that both sides of any steps have handrails. Any raised decks and porches should have guardrails on the edges. Have any leaves, snow, or ice cleared regularly. Use sand or salt on walking paths during winter. Clean up any spills in your garage right away. This includes oil or grease spills. What can I do in the bathroom? Use night lights. Install grab bars by the toilet and in the tub and shower. Do not use towel bars as grab bars. Use non-skid mats or decals in the tub or shower. If you need to sit down in the shower, use a plastic, non-slip stool. Keep the floor dry. Clean  up any water that spills on the floor as soon as it happens. Remove soap buildup in the tub or shower regularly. Attach bath mats securely with double-sided non-slip rug tape. Do not have throw rugs and other things on the floor that can make you trip. What can I do in the bedroom? Use night lights. Make sure that you have a light by your bed that is easy to reach. Do not use any sheets or blankets that are too big for your bed. They should not hang down onto the floor. Have a firm chair that has side arms. You can use this for support while you get dressed. Do not have throw rugs and other things on the floor that can make you trip. What can I do in the kitchen? Clean up any spills right away. Avoid walking on wet floors. Keep items that you use a lot in easy-to-reach places. If you need to reach something above you, use a strong step stool that has a grab bar. Keep electrical cords out of the way. Do not use floor polish or wax that makes floors slippery. If you must use wax, use non-skid floor wax. Do not have throw rugs  and other things on the floor that can make you trip. What can I do with my stairs? Do not leave any items on the stairs. Make sure that there are handrails on both sides of the stairs and use them. Fix handrails that are broken or loose. Make sure that handrails are as long as the stairways. Check any carpeting to make sure that it is firmly attached to the stairs. Fix any carpet that is loose or worn. Avoid having throw rugs at the top or bottom of the stairs. If you do have throw rugs, attach them to the floor with carpet tape. Make sure that you have a light switch at the top of the stairs and the bottom of the stairs. If you do not have them, ask someone to add them for you. What else can I do to help prevent falls? Wear shoes that: Do not have high heels. Have rubber bottoms. Are comfortable and fit you well. Are closed at the toe. Do not wear sandals. If you  use a stepladder: Make sure that it is fully opened. Do not climb a closed stepladder. Make sure that both sides of the stepladder are locked into place. Ask someone to hold it for you, if possible. Clearly mark and make sure that you can see: Any grab bars or handrails. First and last steps. Where the edge of each step is. Use tools that help you move around (mobility aids) if they are needed. These include: Canes. Walkers. Scooters. Crutches. Turn on the lights when you go into a dark area. Replace any light bulbs as soon as they burn out. Set up your furniture so you have a clear path. Avoid moving your furniture around. If any of your floors are uneven, fix them. If there are any pets around you, be aware of where they are. Review your medicines with your doctor. Some medicines can make you feel dizzy. This can increase your chance of falling. Ask your doctor what other things that you can do to help prevent falls. This information is not intended to replace advice given to you by your health care provider. Make sure you discuss any questions you have with your health care provider. Document Released: 01/09/2009 Document Revised: 08/21/2015 Document Reviewed: 04/19/2014 Elsevier Interactive Patient Education  2017 [employer].

## 2022-05-27 NOTE — Progress Notes (Signed)
Subjective:   Ronald King is a 77 y.o. male who presents for Medicare Annual/Subsequent preventive examination.  I connected with  [name] on 05/27/22 by a audio enabled telemedicine application and verified that I am speaking with the correct person using two identifiers.  Patient Location: Home  Provider Location: Office/Clinic  I discussed the limitations of evaluation and management by telemedicine. The patient expressed understanding and agreed to proceed.   Review of Systems     Cardiac Risk Factors include: advanced age (>83mn, >>19women);male gender;obesity (BMI >30kg/m2);dyslipidemia;diabetes mellitus;hypertension     Objective:    There were no vitals filed for this visit. There is no height or weight on file to calculate BMI.     05/27/2022   11:01 AM 03/27/2022    4:19 PM 08/03/2021    2:45 PM 04/30/2021   11:47 AM 12/14/2018    1:18 PM 08/08/2017    4:38 PM 02/23/2017   12:59 PM  Advanced Directives  Does Patient Have a Medical Advance Directive? Yes No Yes Yes Yes Yes Yes  Type of AParamedicof AWhite CastleLiving will  HBlack EarthLiving will HNeuse ForestLiving will HMadisonLiving will HAlbionLiving will HJemez SpringsLiving will  Does patient want to make changes to medical advance directive?     No - Patient declined No - Patient declined   Copy of HCasper Mountainin Chart? Yes - validated most recent copy scanned in chart (See row information)   Yes - validated most recent copy scanned in chart (See row information) Yes - validated most recent copy scanned in chart (See row information) No - copy requested     Current Medications (verified) Outpatient Encounter Medications as of 05/27/2022  Medication Sig   rosuvastatin (CRESTOR) 40 MG tablet Take 1 tablet (40 mg total) by mouth at bedtime.   amLODipine (NORVASC) 10  MG tablet Take 1 tablet (10 mg total) by mouth daily.   ascorbic acid (VITAMIN C) 500 MG tablet Take 1,000 mg by mouth daily.   aspirin EC 81 MG tablet Take 1 tablet (81 mg total) by mouth daily. Swallow whole.   Calcium Carbonate-Vitamin D 600-400 MG-UNIT tablet Take 1 tablet by mouth daily.   cephALEXin (KEFLEX) 500 MG capsule Take 1 capsule (500 mg total) by mouth 2 (two) times daily.   clonazePAM (KLONOPIN) 1 MG tablet Take 1 mg by mouth 2 (two) times daily.   Coenzyme Q10 (CO Q 10 PO) Take 1 tablet by mouth daily.    Cyanocobalamin (B-12) 2000 MCG TABS Take 1 tablet by mouth daily.   Dulaglutide (TRULICITY) [id number] Inject 0.75 mg into the skin once a week.   fenofibrate (TRICOR) 145 MG tablet Take 1 tablet (145 mg total) by mouth daily.   HYDROcodone bit-homatropine (HYCODAN) 5-1.5 MG/5ML syrup Take 5 mLs by mouth every 6 (six) hours as needed for cough.   lansoprazole (PREVACID) 30 MG capsule TAKE 1 CAPSULE(30 MG) BY MOUTH TWICE DAILY BEFORE A MEAL   Multiple Vitamins-Minerals (CENTRUM SILVER PO) Take 1 tablet by mouth daily. Reported on 03/17/2015   PARoxetine (PAXIL) 20 MG tablet Take 20 mg by mouth daily.   pyridOXINE (VITAMIN B-6) 100 MG tablet Take 100 mg by mouth daily.   sucralfate (CARAFATE) 1 g tablet TAKE 1 TABLET(1 GRAM) BY MOUTH TWICE DAILY (Patient taking differently: as needed.)   [DISCONTINUED] amoxicillin (AMOXIL) 500 MG tablet Take 1  tablet (500 mg total) by mouth 3 (three) times daily. As needed for bronchitis flares   No facility-administered encounter medications on file as of 05/27/2022.    Allergies (verified) Montelukast, Ceftin [cefuroxime axetil], Duloxetine, Lyrica [pregabalin], Metformin hcl, Neurontin [gabapentin], and Testosterone   History: Past Medical History:  Diagnosis Date   Allergy    Anemia    Anxiety    Blood transfusion without reported diagnosis    Chronic bronchitis    Chronic cough    Diabetes ([place]) 07-2011   per Dr Buddy Duty    DJD (degenerative joint disease)    Dyslipidemia    Esophageal polyp    GERD (gastroesophageal reflux disease)    s/p [employer]   Hemorrhoids, internal    History of gastrointestinal hemorrhage    Hypertension    Hypogonadism, male    per Dr Buddy Duty   Neuromuscular disorder [facility])    neuropathy feet   Neuropathy    per neuro-Dr Joselyn Glassman vicodin   OSA (obstructive sleep apnea)    on CPAP   Sleep apnea    CPAP at night   Vitamin D deficiency    Past Surgical History:  Procedure Laterality Date   ANAL FISSURE REPAIR     hand fracture surgery     left   KNEE ARTHROSCOPY  2007   Dr. Marlou Sa   LAPAROSCOPIC NISSEN FUNDOPLICATION  [id number]   Dr. Hassell Done   NOSE SURGERY     TONSILLECTOMY     UPPER GASTROINTESTINAL ENDOSCOPY     Family History  Problem Relation Age of Onset   Cerebral aneurysm Father    Alcohol abuse Father    Neuropathy Mother    Heart disease Mother        died at age 23   Migraines Brother        several   Diabetes Maternal Uncle    CAD Brother        CABG in his 69s   Colon cancer Neg Hx    Prostate cancer Neg Hx    Esophageal cancer Neg Hx    Stomach cancer Neg Hx    Rectal cancer Neg Hx    Social History   Socioeconomic History   Marital status: Married    Spouse name: Not on file   Number of children: 1   Years of education: Not on file   Highest education level: Not on file  Occupational History   Occupation: Technical brewer, retired    Comment: retired   Occupation: minister    Comment: [employer], [place] [employer]  Tobacco Use   Smoking status: Former    Packs/day: 1.00    Years: 3.00    Total pack years: 3.00    Types: Cigarettes    Quit date: 03/29/1965    Years since quitting: 57.2   Smokeless tobacco: Never  Vaping Use   Vaping Use: Never used  Substance and Sexual Activity   Alcohol use: No   Drug use: No   Sexual activity: Not on file  Other Topics Concern   Not on file  Social History Narrative   Lives w/ wife   Social  Determinants of Health   Financial Resource Strain: Low Risk  (04/30/2021)   Overall Financial Resource Strain (CARDIA)    Difficulty of Paying Living Expenses: Not hard at all  Food Insecurity: No Food Insecurity (05/27/2022)   Hunger Vital Sign    Worried About Running Out of Food in the Last Year: Never true  Ran Out of Food in the Last Year: Never true  Transportation Needs: No Transportation Needs (05/27/2022)   PRAPARE - Hydrologist (Medical): No    Lack of Transportation (Non-Medical): No  Physical Activity: Sufficiently Active (04/30/2021)   Exercise Vital Sign    Days of Exercise per Week: 7 days    Minutes of Exercise per Session: 30 min  Stress: No Stress Concern Present (04/30/2021)   [place]    Feeling of Stress : Not at all  Social Connections: Moderately Integrated (04/30/2021)   Social Connection and Isolation Panel [NHANES]    Frequency of Communication with Friends and Family: More than three times a week    Frequency of Social Gatherings with Friends and Family: More than three times a week    Attends Religious Services: More than 4 times per year    Active Member of [employer] or Organizations: No    Attends Music therapist: Never    Marital Status: Married    Tobacco Counseling Counseling given: Not Answered   Clinical Intake:  Pre-visit preparation completed: Yes  Pain : No/denies pain  Nutritional Risks: None Diabetes: Yes CBG done?: No Did pt. bring in CBG monitor from home?: No  How often do you need to have someone help you when you read instructions, pamphlets, or other written materials from your doctor or pharmacy?: 1 - Never   Activities of Daily Living    05/27/2022   11:06 AM  In your present state of health, do you have any difficulty performing the following activities:  Hearing? 1  Vision? 0  Difficulty concentrating or making  decisions? 0  Walking or climbing stairs? 1  Dressing or bathing? 0  Doing errands, shopping? 0  Preparing Food and eating ? N  Using the Toilet? N  In the past six months, have you accidently leaked urine? Y  Do you have problems with loss of bowel control? N  Managing your Medications? N  Managing your Finances? N  Housekeeping or managing your Housekeeping? N    Patient Care Team: Colon Branch, MD as PCP - General (Internal Medicine) Delrae Rend, MD as Consulting Physician (Endocrinology) Noralee Space, MD as Consulting Physician (Pulmonary Disease) Kerin Perna., MD as Consulting Physician (Neurology) Nickie Retort, MD (Inactive) as Consulting Physician (Urology) [facility])  Indicate any recent Medical Services you may have received from other than [facility] providers in the past year (date may be approximate).     Assessment:   This is a routine wellness examination for Aadith.  Hearing/Vision screen No results found.  Dietary issues and exercise activities discussed: Current Exercise Habits: The patient does not participate in regular exercise at present (does yard work), Exercise limited by: respiratory conditions(s)   Goals Addressed   None    Depression Screen    05/27/2022   11:05 AM 11/12/2021   10:53 AM 07/09/2021   11:15 AM 04/30/2021   11:54 AM 01/07/2021   11:01 AM 08/21/2020   10:13 AM 01/03/2020    9:03 AM  PHQ 2/9 Scores  PHQ - 2 Score 0 0 0 0 0 0 0    Fall Risk    05/27/2022   11:01 AM 11/12/2021   10:53 AM 08/03/2021    2:45 PM 07/09/2021   11:14 AM 04/30/2021   11:50 AM  [place] in the past year? 1 0 0  0 1  Number falls in past yr: 1 0 0 0 0  Injury with Fall? 1 0 0 0 0  Risk for fall due to : History of fall(s)      Follow up Falls evaluation completed Falls evaluation completed  Falls evaluation completed Falls prevention discussed    FALL RISK PREVENTION PERTAINING TO THE HOME:  Any stairs in or around the  home? Yes  If so, are there any without handrails? No  Home free of loose throw rugs in walkways, pet beds, electrical cords, etc? Yes  Adequate lighting in your home to reduce risk of falls? Yes   ASSISTIVE DEVICES UTILIZED TO PREVENT FALLS:  Life alert? No  Use of a cane, walker or w/c? No  Grab bars in the bathroom? Yes  Shower chair or bench in shower? Yes  Elevated toilet seat or a handicapped toilet?  Comfort height  TIMED UP AND GO:  Was the test performed?  No, audio visit .    Cognitive Function:    09/24/2015   10:10 AM  MMSE - Mini Mental State Exam  Orientation to time 5  Orientation to Place 5  Registration 3  Attention/ Calculation 5  Recall 3  Language- name 2 objects 2  Language- repeat 1  Language- follow 3 step command 3  Language- read & follow direction 1  Write a sentence 1  Copy design 1  Total score 30        05/27/2022   11:14 AM  6CIT Screen  What Year? 0 points  What month? 0 points  What time? 0 points  Count back from 20 0 points  Months in reverse 0 points  Repeat phrase 0 points  Total Score 0 points    Immunizations Immunization History  Administered Date(s) Administered   Fluad Quad(high Dose 65+) 12/26/2018, 01/07/2021   Hepatitis A 11/12/2005, 12/16/2005, 03/06/2015   Hepatitis B 11/12/2005, 12/16/2005, 03/06/2015   IPV 11/12/2005   Influenza Split 01/28/2011, 12/16/2011   Influenza Whole 12/25/2008   Influenza, High Dose Seasonal PF 01/09/2015, 12/13/2016   Influenza,inj,Quad PF,6+ Mos 01/10/2014   Influenza-Unspecified 11/27/2012, 12/29/2017, 12/28/2019   PFIZER(Purple Top)SARS-COV-2 Vaccination 04/15/2019, 05/05/2019, 01/16/2020, 09/25/2020   Pneumococcal Conjugate-13 09/06/2013   Pneumococcal Polysaccharide-23 12/16/2011   Td 09/24/2015   Tdap 12/16/2005   Typhoid Live 11/12/2005   Yellow Fever 11/12/2005   Zoster Recombinat (Shingrix) 12/29/2017, 02/24/2018   Zoster, Live 12/15/2012    TDAP status: Up to  date  Flu Vaccine status: Declined, Education has been provided regarding the importance of this vaccine but patient still declined. Advised may receive this vaccine at local pharmacy or Health Dept. Aware to provide a copy of the vaccination record if obtained from local pharmacy or Health Dept. Verbalized acceptance and understanding.  Pneumococcal vaccine status: Up to date  Covid-19 vaccine status: Information provided on how to obtain vaccines.   Qualifies for Shingles Vaccine? Yes   Zostavax completed Yes   Shingrix Completed?: Yes  Screening Tests Health Maintenance  Topic Date Due   OPHTHALMOLOGY EXAM  10/23/2021   COLONOSCOPY (Pts 45-3yr Insurance coverage will need to be confirmed)  11/07/2021   COVID-19 Vaccine (5 - 2023-24 season) 11/27/2021   Medicare Annual Wellness (AWV)  04/30/2022   INFLUENZA VACCINE  06/27/2022 (Originally 10/27/2021)   HEMOGLOBIN A1C  07/23/2022   Diabetic kidney evaluation - Urine ACR  07/24/2022   FOOT EXAM  07/24/2022   Diabetic kidney evaluation - eGFR measurement  12/04/2022  DTaP/Tdap/Td (3 - Td or Tdap) 09/23/2025   Pneumonia Vaccine 71+ Years old  Completed   Hepatitis C Screening  Completed   Zoster Vaccines- Shingrix  Completed   HPV VACCINES  Aged Out    Health Maintenance  Health Maintenance Due  Topic Date Due   OPHTHALMOLOGY EXAM  10/23/2021   COLONOSCOPY (Pts 45-57yr Insurance coverage will need to be confirmed)  11/07/2021   COVID-19 Vaccine (5 - 2023-24 season) 11/27/2021   Medicare Annual Wellness (AWV)  04/30/2022    Colorectal cancer screening: Type of screening: Colonoscopy. Completed 11/08/18. Repeat every 3 years pt will call to schedule  Lung Cancer Screening: (Low Dose CT Chest recommended if Age 76-80years, 30 pack-year currently smoking OR have quit w/in 15years.) does not qualify.   Additional Screening:  Hepatitis C Screening: does qualify; Completed 03/17/15  Vision Screening: Recommended annual  ophthalmology exams for early detection of glaucoma and other disorders of the eye. Is the patient up to date with their annual eye exam?  Yes  Who is the provider or what is the name of the office in which the patient attends annual eye exams? [facility]. If pt is not established with a provider, would they like to be referred to a provider to establish care? No .   Dental Screening: Recommended annual dental exams for proper oral hygiene  Community Resource Referral / Chronic Care Management: CRR required this visit?  No   CCM required this visit?  No      Plan:     I have personally reviewed and noted the following in the patient's chart:   Medical and social history Use of alcohol, tobacco or illicit drugs  Current medications and supplements including opioid prescriptions. Patient is not currently taking opioid prescriptions. Functional ability and status Nutritional status Physical activity Advanced directives List of other physicians Hospitalizations, surgeries, and ER visits in previous 12 months Vitals Screenings to include cognitive, depression, and falls Referrals and appointments  In addition, I have reviewed and discussed with patient certain preventive protocols, quality metrics, and best practice recommendations. A written personalized care plan for preventive services as well as general preventive health recommendations were provided to patient.   Due to this being a telephonic visit, the after visit summary with patients personalized plan was offered to patient via mail or my-chart. Per request, patient was mailed a copy of AVS.   BBeatris Ship CLyman  05/27/2022   Nurse Notes: None

## 2022-06-02 ENCOUNTER — Encounter: Payer: Medicare PPO | Admitting: Internal Medicine

## 2022-06-14 ENCOUNTER — Telehealth: Payer: Self-pay | Admitting: Pulmonary Disease

## 2022-06-14 NOTE — Telephone Encounter (Signed)
States he's been sob since last week, offered appt with NP for evaluation

## 2022-06-14 NOTE — Telephone Encounter (Signed)
PT states he has burning in his left lung and this is a sign it may turn into bronchitis. No appt avail sooner than late April. Would like nurse to call please.   His # is [phone]

## 2022-06-16 ENCOUNTER — Telehealth: Payer: Self-pay | Admitting: Nurse Practitioner

## 2022-06-16 ENCOUNTER — Encounter: Payer: Self-pay | Admitting: Nurse Practitioner

## 2022-06-16 ENCOUNTER — Other Ambulatory Visit: Payer: Self-pay

## 2022-06-16 ENCOUNTER — Ambulatory Visit: Payer: Medicare PPO | Admitting: Nurse Practitioner

## 2022-06-16 ENCOUNTER — Ambulatory Visit (INDEPENDENT_AMBULATORY_CARE_PROVIDER_SITE_OTHER): Payer: Medicare PPO

## 2022-06-16 VITALS — BP 132/74 | HR 94 | Temp 98.5°F | Ht 73.0 in | Wt 266.0 lb

## 2022-06-16 DIAGNOSIS — G4733 Obstructive sleep apnea (adult) (pediatric): Secondary | ICD-10-CM | POA: Diagnosis not present

## 2022-06-16 DIAGNOSIS — J329 Chronic sinusitis, unspecified: Secondary | ICD-10-CM

## 2022-06-16 DIAGNOSIS — J310 Chronic rhinitis: Secondary | ICD-10-CM | POA: Diagnosis not present

## 2022-06-16 DIAGNOSIS — J42 Unspecified chronic bronchitis: Secondary | ICD-10-CM | POA: Diagnosis not present

## 2022-06-16 DIAGNOSIS — J209 Acute bronchitis, unspecified: Secondary | ICD-10-CM | POA: Diagnosis not present

## 2022-06-16 DIAGNOSIS — R059 Cough, unspecified: Secondary | ICD-10-CM | POA: Diagnosis not present

## 2022-06-16 MED ORDER — AMOXICILLIN 500 MG PO TABS: 500.0000 mg | ORAL_TABLET | Freq: Three times a day (TID) | ORAL | 1 refills | Status: DC

## 2022-06-16 MED ORDER — BUDESONIDE 0.5 MG/2ML IN SUSP: 0.5000 mg | Freq: Two times a day (BID) | RESPIRATORY_TRACT | 5 refills | Status: AC | PRN

## 2022-06-16 MED ORDER — PREDNISONE 20 MG PO TABS: 40.0000 mg | ORAL_TABLET | Freq: Every day | ORAL | 0 refills | Status: AC

## 2022-06-16 MED ORDER — AZITHROMYCIN 250 MG PO TABS: ORAL_TABLET | ORAL | 0 refills | Status: DC

## 2022-06-16 MED ORDER — AZELASTINE-FLUTICASONE 137-50 MCG/ACT NA SUSP: 1.0000 | Freq: Two times a day (BID) | NASAL | 2 refills | Status: DC

## 2022-06-16 MED ORDER — HYDROCODONE BIT-HOMATROP MBR 5-1.5 MG/5ML PO SOLN: 5.0000 mL | Freq: Four times a day (QID) | ORAL | 0 refills | Status: DC | PRN

## 2022-06-16 NOTE — Assessment & Plan Note (Signed)
Receives good benefit from  use. Download not reviewed today. Will re-evaluate at f/u.

## 2022-06-16 NOTE — Progress Notes (Signed)
@Patient  ID: Ronald King, male    DOB: April 12, 1945, 77 y.o.   MRN: [id number]  Chief Complaint  Patient presents with   Follow-up    Referring provider: Colon Branch, MD  HPI: 77 year old male, former smoker followed for chronic cough, recurrent bronchitis, and OSA on CPAP. He is a patient of Dr. Juanetta Gosling and last seen in office 12/31/2021. Past medical history significant for hypertension, HLD, GERD, IBS, hemorrhoids, BPH, ED, DJD, HA, vitamin D deficiency, neuropathy, anxiety.  TEST/EVENTS:  11/15/2018 RAST positive for grasses, IgE 87 12/25/2018 PFT: FVC 75, FEV1 84, ratio 86, TLC 82, DLCO 103  12/31/2021: OV with Dr. Halford Chessman.  He was having headaches and vertigo.  MRI brain from 07/15/2021 showed moderate chronic small vessel ischemic changes and cerebral white matter.  These have improved.  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  He gets bronchitis a couple times per month.  Knocks it out with cough syrup and a couple pills of amoxicillin.  Sinuses are okay.  He has more stress which is causing trouble with his sleep.  Trying to simplify his life.  He did get a new CPAP mask which is fitting better.  No issues with pressure settings. Suspect his chronic cough is from upper airway irritation and GERD.  Refilled amoxicillin and hydrocodone.  Has been evaluated by ENT in the past and found to have deviated septum.  No surgical intervention.  Nasal steroid sprays have been ineffective in the past.  He uses Afrin.  Advised to limit use of this.  Compliant with CPAP therapy.  Continue CPAP 16 cmH2O.  06/16/2022: Today - acute Patient presents today for acute visit.  He tells me that over the last 3 weeks he has had an increased cough and shortness of breath with the coughing spells.  He typically gets this a few times a year and uses amoxicillin to help knock it out.  He did start taking this but has not significantly improved.  He also was concerned that he may have  a sinus infection.  Having some increased nasal congestion and white to yellow drainage.  He is also having some discomfort in the middle of his chest, feels like a burning sensation.  He has had this in the past before when he has had an ulcer.  He has not seen GI to discuss this.  He was unsure if it was related to bronchitis.  Denies any fevers, chills, wheezing, hemoptysis, leg swelling, dysphagia, changes in bowel habits, abdominal pain, nausea or vomiting.  He has tried multiple different kinds of inhalers in the past.  He tells me that the only kind of relief that he had in the past with was when he has been given a nebulizer treatment.  He felt like this really opened up his airways and decreased his cough.  He does not have a nebulizer machine at home.  He uses Hydromet cough syrup whenever he has an episode of bronchitis.  Needs a refill of this.  He uses Afrin almost daily.  Tells me that anytime he stops using it he feels he cannot breathe.  He has tried steroid nasal spray in the past without much success.  He does not do any nasal rinses.  He is on lansoprazole twice daily for reflux, which she feels like helps.  Using CPAP nightly without any issues.  Allergies  Allergen Reactions   Montelukast Shortness Of Breath   Ceftin [Cefuroxime  Axetil]    Duloxetine     Other reaction(s): Other (See Comments) refuses   Lyrica [Pregabalin] Other (See Comments)    Drowsiness   Metformin Hcl     Other reaction(s): Unknown   Neurontin [Gabapentin] Other (See Comments)    Drowsiness    Testosterone Other (See Comments)    HTN    Immunization History  Administered Date(s) Administered   Fluad Quad(high Dose 65+) 12/26/2018, 01/07/2021   Hepatitis A 11/12/2005, 12/16/2005, 03/06/2015   Hepatitis B 11/12/2005, 12/16/2005, 03/06/2015   IPV 11/12/2005   Influenza Split 01/28/2011, 12/16/2011   Influenza Whole 12/25/2008   Influenza, High Dose Seasonal PF 01/09/2015, 12/13/2016    Influenza,inj,Quad PF,6+ Mos 01/10/2014   Influenza-Unspecified 11/27/2012, 12/29/2017, 12/28/2019   PFIZER(Purple Top)SARS-COV-2 Vaccination 04/15/2019, 05/05/2019, 01/16/2020, 09/25/2020   Pneumococcal Conjugate-13 09/06/2013   Pneumococcal Polysaccharide-23 12/16/2011   Td 09/24/2015   Tdap 12/16/2005   Typhoid Live 11/12/2005   Yellow Fever 11/12/2005   Zoster Recombinat (Shingrix) 12/29/2017, 02/24/2018   Zoster, Live 12/15/2012    Past Medical History:  Diagnosis Date   Allergy    Anemia    Anxiety    Blood transfusion without reported diagnosis    Chronic bronchitis    Chronic cough    Diabetes ([place]) 07-2011   per Dr Buddy Duty   DJD (degenerative joint disease)    Dyslipidemia    Esophageal polyp    GERD (gastroesophageal reflux disease)    s/p [employer].   Hemorrhoids, internal    History of gastrointestinal hemorrhage    Hypertension    Hypogonadism, male    per Dr Buddy Duty   Neuromuscular disorder [facility])    neuropathy feet   Neuropathy    per neuro-Dr Joselyn Glassman vicodin   OSA (obstructive sleep apnea)    on CPAP   Sleep apnea    CPAP at night   Vitamin D deficiency     Tobacco History: Social History   Tobacco Use  Smoking Status Former   Packs/day: 1.00   Years: 3.00   Additional pack years: 0.00   Total pack years: 3.00   Types: Cigarettes   Quit date: 03/29/1965   Years since quitting: 57.2  Smokeless Tobacco Never   Counseling given: Not Answered   Outpatient Medications Prior to Visit  Medication Sig Dispense Refill   amLODipine (NORVASC) 10 MG tablet Take 1 tablet (10 mg total) by mouth daily. 90 tablet 1   ascorbic acid (VITAMIN C) 500 MG tablet Take 1,000 mg by mouth daily.     aspirin EC 81 MG tablet Take 1 tablet (81 mg total) by mouth daily. Swallow whole.     Calcium Carbonate-Vitamin D 600-400 MG-UNIT tablet Take 1 tablet by mouth daily.     clonazePAM (KLONOPIN) 1 MG tablet Take 1 mg by mouth 2 (two) times daily.     Coenzyme Q10 (CO Q 10  PO) Take 1 tablet by mouth daily.      Cyanocobalamin (B-12) 2000 MCG TABS Take 1 tablet by mouth daily.     Dulaglutide (TRULICITY) A999333 0000000 SOPN Inject 0.75 mg into the skin once a week.     fenofibrate (TRICOR) 145 MG tablet Take 1 tablet (145 mg total) by mouth daily. 90 tablet 1   lansoprazole (PREVACID) 30 MG capsule TAKE 1 CAPSULE(30 MG) BY MOUTH TWICE DAILY BEFORE A MEAL 180 capsule 3   Multiple Vitamins-Minerals (CENTRUM SILVER PO) Take 1 tablet by mouth daily. Reported on 03/17/2015     PARoxetine (  PAXIL) 20 MG tablet Take 20 mg by mouth daily.     pyridOXINE (VITAMIN B-6) 100 MG tablet Take 100 mg by mouth daily.     rosuvastatin (CRESTOR) 40 MG tablet Take 1 tablet (40 mg total) by mouth at bedtime. 90 tablet 1   sucralfate (CARAFATE) 1 g tablet TAKE 1 TABLET(1 GRAM) BY MOUTH TWICE DAILY (Patient taking differently: as needed.) 30 tablet 1   cephALEXin (KEFLEX) 500 MG capsule Take 1 capsule (500 mg total) by mouth 2 (two) times daily. 10 capsule 0   HYDROcodone bit-homatropine (HYCODAN) 5-1.5 MG/5ML syrup Take 5 mLs by mouth every 6 (six) hours as needed for cough. 475 mL 0   No facility-administered medications prior to visit.     Review of Systems:   Constitutional: No weight loss or gain, night sweats, fevers, chills, fatigue, or lassitude. HEENT: No headaches, difficulty swallowing, tooth/dental problems, or sore throat. No sneezing, itching, ear ache. +nasal congestion, post nasal drip CV:  No chest pain, orthopnea, PND, swelling in lower extremities, anasarca, dizziness, palpitations, syncope Resp: +shortness of breath with coughing spells; bronchitic cough; burning sensation in chest. No excess mucus or change in color of mucus. No hemoptysis. No wheezing.  No chest wall deformity GI:  +heartburn, indigestion. No abdominal pain, nausea, vomiting, diarrhea, change in bowel habits, loss of appetite, bloody stools.  GU: No dysuria, change in color of urine, urgency or  frequency. Skin: No rash, lesions, ulcerations MSK:  No joint pain or swelling.   Neuro: No dizziness or lightheadedness.  Psych: No depression or anxiety. Mood stable.     Physical Exam:  BP 132/74 (BP Location: Right Arm, Patient Position: Sitting, Cuff Size: Normal)   Pulse 94   Temp 98.5 F (36.9 C) (Oral)   Ht 6\' 1"  (1.854 m)   Wt 266 lb (120.7 kg)   SpO2 98%   BMI 35.09 kg/m   GEN: Pleasant, interactive, well-appearing; obese; in no acute distress. HEENT:  Normocephalic and atraumatic. PERRLA. Sclera white. Nasal turbinates pink, moist and patent bilaterally. No rhinorrhea present. Oropharynx pink and moist, without exudate or edema. No lesions, ulcerations, or postnasal drip.  NECK:  Supple w/ fair ROM. No JVD present. Normal carotid impulses w/o bruits. Thyroid symmetrical with no goiter or nodules palpated. No lymphadenopathy.   CV: RRR, no m/r/g, no peripheral edema. Pulses intact, +2 bilaterally. No cyanosis, pallor or clubbing. PULMONARY:  Unlabored, regular breathing. Mild scattered rhonchi bilaterally A&P. Bronchitic cough. No accessory muscle use.  GI: BS present and normoactive. Soft, non-tender to palpation. No organomegaly or masses detected.  MSK: No erythema, warmth or tenderness. Cap refil <2 sec all extrem. No deformities or joint swelling noted.  Neuro: A/Ox3. No focal deficits noted.   Skin: Warm, no lesions or rashe Psych: Normal affect and behavior. Judgement and thought content appropriate.     Lab Results:  CBC    Component Value Date/Time   WBC 6.4 01/07/2021 1136   RBC 4.63 01/07/2021 1136   HGB 14.3 01/07/2021 1136   HCT 41.9 01/07/2021 1136   PLT 231.0 01/07/2021 1136   MCV 90.6 01/07/2021 1136   MCH 31.5 01/03/2020 0946   MCHC 34.1 01/07/2021 1136   RDW 13.3 01/07/2021 1136   LYMPHSABS 1.1 01/07/2021 1136   MONOABS 0.5 01/07/2021 1136   EOSABS 0.2 01/07/2021 1136   BASOSABS 0.0 01/07/2021 1136    BMET    Component Value Date/Time    NA 142 12/03/2021 0917   NA 142  04/27/2018 0000   K 4.0 12/03/2021 0917   CL 105 12/03/2021 0917   CO2 31 12/03/2021 0917   GLUCOSE 101 (H) 12/03/2021 0917   GLUCOSE 98 03/07/2006 1711   BUN 16 12/03/2021 0917   BUN 15 04/27/2018 0000   CREATININE 1.14 12/03/2021 0917   CREATININE 1.23 (H) 01/03/2020 0946   CALCIUM 9.1 12/03/2021 0917   GFRNONAA 66.83 02/16/2010 0938   GFRAA 110 04/17/2008 1131    BNP No results found for: "BNP"   Imaging:  DG Chest 2 View  Result Date: 06/16/2022 CLINICAL DATA:  Cough. EXAM: CHEST - 2 VIEW COMPARISON:  01/16/2021 FINDINGS: Grossly unchanged cardiac silhouette and mediastinal contours. Minimal perihilar interstitial opacities. No focal airspace opacities. No pleural effusion or pneumothorax. No evidence of edema. No acute osseous abnormalities. Stigmata of dish within the thoracic spine. IMPRESSION: Findings suggestive of airways disease / bronchitis. No focal airspace opacities to suggest pneumonia. Electronically Signed   By: Sandi Mariscal M.D.   On: 06/16/2022 09:56         Latest Ref Rng & Units 12/21/2018   11:04 AM  PFT Results  FVC-Pre L 3.55   FVC-Predicted Pre % 75   FVC-Post L 3.65   FVC-Predicted Post % 77   Pre FEV1/FVC % % 82   Post FEV1/FCV % % 86   FEV1-Pre L 2.92   FEV1-Predicted Pre % 84   FEV1-Post L 3.13   DLCO uncorrected ml/min/mmHg 28.21   DLCO UNC% % 103   DLVA Predicted % 120   TLC L 6.14   TLC % Predicted % 82   RV % Predicted % 86     No results found for: "NITRICOXIDE"      Assessment & Plan:   Chronic sinusitis with recurrent bronchitis Chronic sinusitis with frequent episodes of bronchitis. He has acute episode today that has not responded to his typical regimen of amoxicillin. We will obtain CXR to rule out superimposed infection. He will start prednisone burst and empiric z pack. Cough control measures advised. I would like to try him on nebulized ICS. He has had good response to neb treatments  in the past. Rx sent today. I have recommended he try to avoid frequent use of afrin and discussed the side effect of rebound rhinitis that he experiences when he comes off of it. We will trial him on fluticasone/azelastine combo spray. I also advised him to retry saline rinses - provided with Milta Deiters med rinse bottle and instructed on proper use.   Patient Instructions  Continue hydromet cough syrup 5 mL every 6 hours as needed for cough. Do not drive after taking. Use with caution as this may cause drowsiness Continue lasnoprazole 1 capsule Twice daily Continue amoxicillin 500 mg Three times a day as needed for bronchitis flares  -Prednisone 40 mg daily for 5 days. Take in AM with food -Z pack - take 2 tabs on day one then 1 tab daily for four additional days -Budesonide neb treatments 2 mL Twice daily as needed for cough, shortness of breath or wheezing. If you notice this helps, you can use twice daily and let me know. Brush tongue and rinse mouth after use Milta Deiters med saline rinse 1-2 times a day. Use 20-30 minutes before your other nasal spray -Fluticasone-azelastine nasal spray 1 spray each nostril Twice daily  -Try to limit use of afrin. You may have to go through a transition period where you feel more congested after you stop using it  Chest x ray today  Orders sent for nebulizer machine   Call your GI doctor about the ulcer pain  Follow up in 6 weeks with Dr. Halford Chessman (1st) or Katie Josedaniel Haye,NP. If symptoms do not improve or worsen, please contact office for sooner follow up or seek emergency care.      Obstructive sleep apnea Receives good benefit from  use. Download not reviewed today. Will re-evaluate at f/u.   I spent 35 minutes of dedicated to the care of this patient on the date of this encounter to include pre-visit review of records, face-to-face time with the patient discussing conditions above, post visit ordering of testing, clinical documentation with the electronic health  record, making appropriate referrals as documented, and communicating necessary findings to members of the patients care team.  Clayton Bibles, NP 06/16/2022  Pt aware and understands NP's role.

## 2022-06-16 NOTE — Patient Instructions (Addendum)
Continue hydromet cough syrup 5 mL every 6 hours as needed for cough. Do not drive after taking. Use with caution as this may cause drowsiness Continue lasnoprazole 1 capsule Twice daily Continue amoxicillin 500 mg Three times a day as needed for bronchitis flares  -Prednisone 40 mg daily for 5 days. Take in AM with food -Z pack - take 2 tabs on day one then 1 tab daily for four additional days -Budesonide neb treatments 2 mL Twice daily as needed for cough, shortness of breath or wheezing. If you notice this helps, you can use twice daily and let me know. Brush tongue and rinse mouth after use Milta Deiters med saline rinse 1-2 times a day. Use 20-30 minutes before your other nasal spray -Fluticasone-azelastine nasal spray 1 spray each nostril Twice daily  -Try to limit use of afrin. You may have to go through a transition period where you feel more congested after you stop using it   Chest x ray today  Orders sent for nebulizer machine   Call your GI doctor about the ulcer pain  Follow up in 6 weeks with Dr. Halford Chessman (1st) or Katie Seaver Machia,NP. If symptoms do not improve or worsen, please contact office for sooner follow up or seek emergency care.

## 2022-06-16 NOTE — Assessment & Plan Note (Signed)
Chronic sinusitis with frequent episodes of bronchitis. He has acute episode today that has not responded to his typical regimen of amoxicillin. We will obtain CXR to rule out superimposed infection. He will start prednisone burst and empiric z pack. Cough control measures advised. I would like to try him on nebulized ICS. He has had good response to neb treatments in the past. Rx sent today. I have recommended he try to avoid frequent use of afrin and discussed the side effect of rebound rhinitis that he experiences when he comes off of it. We will trial him on fluticasone/azelastine combo spray. I also advised him to retry saline rinses - provided with Milta Deiters med rinse bottle and instructed on proper use.   Patient Instructions  Continue hydromet cough syrup 5 mL every 6 hours as needed for cough. Do not drive after taking. Use with caution as this may cause drowsiness Continue lasnoprazole 1 capsule Twice daily Continue amoxicillin 500 mg Three times a day as needed for bronchitis flares  -Prednisone 40 mg daily for 5 days. Take in AM with food -Z pack - take 2 tabs on day one then 1 tab daily for four additional days -Budesonide neb treatments 2 mL Twice daily as needed for cough, shortness of breath or wheezing. If you notice this helps, you can use twice daily and let me know. Brush tongue and rinse mouth after use Milta Deiters med saline rinse 1-2 times a day. Use 20-30 minutes before your other nasal spray -Fluticasone-azelastine nasal spray 1 spray each nostril Twice daily  -Try to limit use of afrin. You may have to go through a transition period where you feel more congested after you stop using it   Chest x ray today  Orders sent for nebulizer machine   Call your GI doctor about the ulcer pain  Follow up in 6 weeks with Dr. Halford Chessman (1st) or Katie Sweden Lesure,NP. If symptoms do not improve or worsen, please contact office for sooner follow up or seek emergency care.

## 2022-06-16 NOTE — Telephone Encounter (Signed)
Ronald King states HYdromet needs prior authorization. Prior authorization department phone number is [phone].Patient is out of medication.

## 2022-06-16 NOTE — Progress Notes (Signed)
Reviewed and agree with assessment/plan.   Chesley Mires, MD [facility] Pulmonary/Critical Care 06/16/2022, 12:54 PM Pager:  [phone]

## 2022-06-16 NOTE — Telephone Encounter (Signed)
Are you able to assist with patients PA for Hycodan cough syrup?

## 2022-06-17 ENCOUNTER — Other Ambulatory Visit (HOSPITAL_COMMUNITY): Payer: Self-pay

## 2022-06-17 DIAGNOSIS — R310 Gross hematuria: Secondary | ICD-10-CM | POA: Diagnosis not present

## 2022-06-17 DIAGNOSIS — N400 Enlarged prostate without lower urinary tract symptoms: Secondary | ICD-10-CM | POA: Diagnosis not present

## 2022-06-17 DIAGNOSIS — R972 Elevated prostate specific antigen [PSA]: Secondary | ICD-10-CM | POA: Diagnosis not present

## 2022-06-17 DIAGNOSIS — R8279 Other abnormal findings on microbiological examination of urine: Secondary | ICD-10-CM | POA: Diagnosis not present

## 2022-06-18 NOTE — Telephone Encounter (Signed)
Advised patient his insurance will not cover cough syrup without cancer diagnosis. Advised patient to call insurance and see what is covered. Leaving encounter open until patient calls back

## 2022-06-18 NOTE — Telephone Encounter (Signed)
Cough suppressants are not covered for diagnosis not involving cancer. Patient has chronic cough related to bronchitis.

## 2022-06-18 NOTE — Telephone Encounter (Signed)
He will need to contact his insurance provider to determine what alternative cough medication is available on his insurance plan formulary and then let us know.

## 2022-06-18 NOTE — Telephone Encounter (Signed)
Dr. Halford Chessman Please see previous encounter. Patients insurance will not cover cough suppressants not related to cancer diagnosis

## 2022-06-24 DIAGNOSIS — Z85828 Personal history of other malignant neoplasm of skin: Secondary | ICD-10-CM | POA: Diagnosis not present

## 2022-06-24 DIAGNOSIS — L814 Other melanin hyperpigmentation: Secondary | ICD-10-CM | POA: Diagnosis not present

## 2022-06-24 DIAGNOSIS — Z08 Encounter for follow-up examination after completed treatment for malignant neoplasm: Secondary | ICD-10-CM | POA: Diagnosis not present

## 2022-06-24 DIAGNOSIS — L918 Other hypertrophic disorders of the skin: Secondary | ICD-10-CM | POA: Diagnosis not present

## 2022-06-24 DIAGNOSIS — L821 Other seborrheic keratosis: Secondary | ICD-10-CM | POA: Diagnosis not present

## 2022-06-24 DIAGNOSIS — D225 Melanocytic nevi of trunk: Secondary | ICD-10-CM | POA: Diagnosis not present

## 2022-06-24 DIAGNOSIS — L853 Xerosis cutis: Secondary | ICD-10-CM | POA: Diagnosis not present

## 2022-06-28 ENCOUNTER — Telehealth: Payer: Self-pay | Admitting: Internal Medicine

## 2022-06-28 DIAGNOSIS — I10 Essential (primary) hypertension: Secondary | ICD-10-CM

## 2022-06-28 DIAGNOSIS — E785 Hyperlipidemia, unspecified: Secondary | ICD-10-CM

## 2022-06-28 DIAGNOSIS — E119 Type 2 diabetes mellitus without complications: Secondary | ICD-10-CM

## 2022-06-28 NOTE — Telephone Encounter (Signed)
Orders placed. Please schedule lab appt at his convenience.  °Thank you.  °

## 2022-06-28 NOTE — Telephone Encounter (Signed)
Pt would like to get his labs done tomorrow for his cpe.

## 2022-06-28 NOTE — Telephone Encounter (Signed)
BMP FLP CBC

## 2022-06-28 NOTE — Telephone Encounter (Signed)
Please advise? Appt scheduled 06/30/22/

## 2022-06-29 ENCOUNTER — Other Ambulatory Visit (INDEPENDENT_AMBULATORY_CARE_PROVIDER_SITE_OTHER): Payer: Medicare PPO

## 2022-06-29 DIAGNOSIS — I10 Essential (primary) hypertension: Secondary | ICD-10-CM

## 2022-06-29 DIAGNOSIS — E785 Hyperlipidemia, unspecified: Secondary | ICD-10-CM

## 2022-06-29 DIAGNOSIS — E119 Type 2 diabetes mellitus without complications: Secondary | ICD-10-CM | POA: Diagnosis not present

## 2022-06-29 LAB — CBC WITH DIFFERENTIAL/PLATELET
Basophils Absolute: 0 10*3/uL (ref 0.0–0.1)
Basophils Relative: 0.1 % (ref 0.0–3.0)
Eosinophils Absolute: 0 10*3/uL (ref 0.0–0.7)
Eosinophils Relative: 0 % (ref 0.0–5.0)
HCT: 43.3 % (ref 39.0–52.0)
Hemoglobin: 14.9 g/dL (ref 13.0–17.0)
Lymphocytes Relative: 9.1 % — ABNORMAL LOW (ref 12.0–46.0)
Lymphs Abs: 0.9 10*3/uL (ref 0.7–4.0)
MCHC: 34.5 g/dL (ref 30.0–36.0)
MCV: 89.2 fl (ref 78.0–100.0)
Monocytes Absolute: 0.3 10*3/uL (ref 0.1–1.0)
Monocytes Relative: 3.5 % (ref 3.0–12.0)
Neutro Abs: 8.5 10*3/uL — ABNORMAL HIGH (ref 1.4–7.7)
Neutrophils Relative %: 87.3 % — ABNORMAL HIGH (ref 43.0–77.0)
Platelets: 249 10*3/uL (ref 150.0–400.0)
RBC: 4.86 Mil/uL (ref 4.22–5.81)
RDW: 13.9 % (ref 11.5–15.5)
WBC: 9.8 10*3/uL (ref 4.0–10.5)

## 2022-06-29 LAB — LIPID PANEL
Cholesterol: 152 mg/dL (ref 0–200)
HDL: 47.3 mg/dL (ref >39.00)
LDL Cholesterol: 91 mg/dL (ref 0–99)
NonHDL: 104.77
Total CHOL/HDL Ratio: 3
Triglycerides: 68 mg/dL (ref 0.0–149.0)
VLDL: 13.6 mg/dL (ref 0.0–40.0)

## 2022-06-29 LAB — BASIC METABOLIC PANEL
BUN: 18 mg/dL (ref 6–23)
CO2: 30 mEq/L (ref 19–32)
Calcium: 9.7 mg/dL (ref 8.4–10.5)
Chloride: 102 mEq/L (ref 96–112)
Creatinine, Ser: 1.1 mg/dL (ref 0.40–1.50)
GFR: 65.54 mL/min (ref >60.00)
Glucose, Bld: 156 mg/dL — ABNORMAL HIGH (ref 70–99)
Potassium: 4.5 mEq/L (ref 3.5–5.1)
Sodium: 138 mEq/L (ref 135–145)

## 2022-06-29 LAB — MICROALBUMIN / CREATININE URINE RATIO
Creatinine,U: 103.2 mg/dL
Microalb Creat Ratio: 4.4 mg/g (ref 0.0–30.0)
Microalb, Ur: 4.5 mg/dL — ABNORMAL HIGH (ref 0.0–1.9)

## 2022-06-30 ENCOUNTER — Encounter: Payer: Self-pay | Admitting: Internal Medicine

## 2022-06-30 ENCOUNTER — Ambulatory Visit (INDEPENDENT_AMBULATORY_CARE_PROVIDER_SITE_OTHER): Payer: Medicare PPO | Admitting: Internal Medicine

## 2022-06-30 VITALS — BP 136/76 | HR 86 | Temp 98.2°F | Resp 18 | Ht 73.0 in | Wt 266.5 lb

## 2022-06-30 DIAGNOSIS — Z0000 Encounter for general adult medical examination without abnormal findings: Secondary | ICD-10-CM

## 2022-06-30 DIAGNOSIS — E785 Hyperlipidemia, unspecified: Secondary | ICD-10-CM

## 2022-06-30 DIAGNOSIS — Z0001 Encounter for general adult medical examination with abnormal findings: Secondary | ICD-10-CM | POA: Diagnosis not present

## 2022-06-30 DIAGNOSIS — I10 Essential (primary) hypertension: Secondary | ICD-10-CM

## 2022-06-30 DIAGNOSIS — N3289 Other specified disorders of bladder: Secondary | ICD-10-CM | POA: Diagnosis not present

## 2022-06-30 DIAGNOSIS — R310 Gross hematuria: Secondary | ICD-10-CM | POA: Diagnosis not present

## 2022-06-30 DIAGNOSIS — N281 Cyst of kidney, acquired: Secondary | ICD-10-CM | POA: Diagnosis not present

## 2022-06-30 MED ORDER — EZETIMIBE 10 MG PO TABS: 10.0000 mg | ORAL_TABLET | Freq: Every day | ORAL | 3 refills | Status: DC

## 2022-06-30 NOTE — Patient Instructions (Addendum)
Recommend to start Zetia (ezetimibe) in addition to your other medications  Vaccines I recommend: PNM 20, RSV,   flu shot q fall  Check the  blood pressure regularly BP GOAL is between 110/65 and  135/85. If it is consistently higher or lower, let me know      GO TO THE FRONT DESK, PLEASE SCHEDULE YOUR APPOINTMENTS Come back for checkup in 6 months

## 2022-06-30 NOTE — Assessment & Plan Note (Signed)
Here for CPX DM: Per Endo Chronic cough, OSA: Sees pulmonary HTN: BP looks very good, recommend to check at home.  Continue amlodipine. High cholesterol: LDL goal 70 (brain MRI with chronic small vessel ischemic changes), last LDL 91, on Crestor 40 mg, recommend Zetia. Multiple questions regards Zetia answer RTC 6 months

## 2022-06-30 NOTE — Assessment & Plan Note (Signed)
-  Td 2017 - PNM 23:  2013;  prevnar 2015 - s/p zostavax and s/p  shingrex - s/p pfizer C-19, booster rec, states will not take anymore  - Rec PNM 20, RSV,   flu shot q fall --CCS: Cscope 08-2008, neg, Cscope 10/2018, next  cscope overdue (10/2021), he is very aware of this, pt will consider cscope after hematuria work up --h/o elevated PSA: saw urology 06/17/22, reported gross hematuria, has a CT scheduled  -- Lifestyle discussed. --Labs from yesterday reviewed.  BMP okay, LDL 91, CBC essentially normal, microalbumin negative.  Multiple questions about blood work results discussed. -- POA on file

## 2022-06-30 NOTE — Progress Notes (Signed)
Subjective:    Patient ID: Ronald King, male    DOB: [birth year], 77 y.o.   MRN: [id number]  DOS:  06/30/2022 Type of visit - description: cpx  Here for sick BX. Has chronic symptoms at baseline. Did have gross hematuria, saw urology few days ago.   Review of Systems  Other than above, a 14 point review of systems is negative      Past Medical History:  Diagnosis Date   Allergy    Anemia    Anxiety    Blood transfusion without reported diagnosis    Chronic bronchitis    Chronic cough    Diabetes 07-2011   per Dr Buddy Duty   DJD (degenerative joint disease)    Dyslipidemia    Esophageal polyp    GERD (gastroesophageal reflux disease)    s/p [employer] F.   Hemorrhoids, internal    History of gastrointestinal hemorrhage    Hypertension    Hypogonadism, male    per Dr Buddy Duty   Neuromuscular disorder    neuropathy feet   Neuropathy    per neuro-Dr Joselyn Glassman vicodin   OSA (obstructive sleep apnea)    on CPAP   Sleep apnea    CPAP at night   Vitamin D deficiency     Past Surgical History:  Procedure Laterality Date   ANAL FISSURE REPAIR     hand fracture surgery     left   KNEE ARTHROSCOPY  2007   Dr. Marlou Sa   LAPAROSCOPIC NISSEN FUNDOPLICATION  [id number]   Dr. Hassell Done   NOSE SURGERY     TONSILLECTOMY     UPPER GASTROINTESTINAL ENDOSCOPY     Social History   Socioeconomic History   Marital status: Married    Spouse name: Not on file   Number of children: 1   Years of education: Not on file   Highest education level: Not on file  Occupational History   Occupation: Technical brewer, retired    Comment: retired   Occupation: minister    Comment: [employer]  Tobacco Use   Smoking status: Former    Packs/day: 1.00    Years: 3.00    Additional pack years: 0.00    Total pack years: 3.00    Types: Cigarettes    Quit date: 03/29/1965    Years since quitting: 57.2   Smokeless tobacco: Never  Vaping Use   Vaping Use: Never used  Substance  and Sexual Activity   Alcohol use: No   Drug use: No   Sexual activity: Not on file  Other Topics Concern   Not on file  Social History Narrative   Lives w/ wife   Social Determinants of Health   Financial Resource Strain: Low Risk  (04/30/2021)   Overall Financial Resource Strain (CARDIA)    Difficulty of Paying Living Expenses: Not hard at all  Food Insecurity: No Food Insecurity (05/27/2022)   Hunger Vital Sign    Worried About Running Out of Food in the Last Year: Never true    [place] in the Last Year: Never true  Transportation Needs: No Transportation Needs (05/27/2022)   PRAPARE - Hydrologist (Medical): No    Lack of Transportation (Non-Medical): No  Physical Activity: Sufficiently Active (04/30/2021)   Exercise Vital Sign    Days of Exercise per Week: 7 days    Minutes of Exercise per Session: 30 min  Stress: No Stress Concern Present (04/30/2021)  [employer] of Occupational [employer] - Occupational Stress Questionnaire    Feeling of Stress : Not at all  Social Connections: Moderately Integrated (04/30/2021)   Social Connection and Isolation Panel [NHANES]    Frequency of Communication with Friends and Family: More than three times a week    Frequency of Social Gatherings with Friends and Family: More than three times a week    Attends Religious Services: More than 4 times per year    Active Member of [employer] or Organizations: No    Attends Archivist Meetings: Never    Marital Status: Married  Human resources officer Violence: Not At Risk (05/27/2022)   Humiliation, Afraid, Rape, and Kick questionnaire    Fear of Current or Ex-Partner: No    Emotionally Abused: No    Physically Abused: No    Sexually Abused: No    Current Outpatient Medications  Medication Instructions   amLODipine (NORVASC) 10 mg, Oral, Daily   amoxicillin (AMOXIL) 500 mg, Oral, 3 times daily with meals, As needed for bronchitis flares   ascorbic acid  (VITAMIN C) 1,000 mg, Oral, Daily   aspirin EC 81 mg, Oral, Daily, Swallow whole.   Azelastine-Fluticasone 137-50 MCG/ACT SUSP 1 spray, Nasal, 2 times daily   budesonide (PULMICORT) 0.5 mg, Nebulization, 2 times daily PRN   Calcium Carbonate-Vitamin D 600-400 MG-UNIT tablet 1 tablet, Oral, Daily,     clonazePAM (KLONOPIN) 1 mg, Oral, 2 times daily,     Coenzyme Q10 (CO Q 10 PO) 1 tablet, Oral, Daily   Cyanocobalamin (B-12) 2000 MCG TABS 1 tablet, Oral, Daily   ezetimibe (ZETIA) 10 mg, Oral, Daily   fenofibrate (TRICOR) 145 mg, Oral, Daily   HYDROcodone bit-homatropine (HYCODAN) 5-1.5 MG/5ML syrup 5 mLs, Oral, Every 6 hours PRN   lansoprazole (PREVACID) 30 MG capsule TAKE 1 CAPSULE(30 MG) BY MOUTH TWICE DAILY BEFORE A MEAL   Multiple Vitamins-Minerals (CENTRUM SILVER PO) 1 tablet, Oral, Daily, Reported on 03/17/2015   PARoxetine (PAXIL) 20 mg, Oral, Daily   pyridOXINE (VITAMIN B6) 100 mg, Oral, Daily   rosuvastatin (CRESTOR) 40 mg, Oral, Daily at bedtime   sucralfate (CARAFATE) 1 g tablet TAKE 1 TABLET(1 GRAM) BY MOUTH TWICE DAILY   Trulicity [id number] mg, Subcutaneous, Weekly       Objective:   Physical Exam BP 136/76   Pulse 86   Temp 98.2 F (36.8 C) (Oral)   Resp 18   Ht 6\' 1"  (1.854 m)   Wt 266 lb 8 oz (120.9 kg)   SpO2 96%   BMI 35.16 kg/m  General: Well developed, NAD, BMI noted Neck: No  thyromegaly  HEENT:  Normocephalic . Face symmetric, atraumatic Lungs:  CTA B Normal respiratory effort, no intercostal retractions, no accessory muscle use. Heart: RRR,  subtle syst  murmur.  Abdomen:  Not distended, soft, non-tender. No rebound or rigidity.   Lower extremities: no pretibial edema bilaterally  Skin: Exposed areas without rash. Not pale. Not jaundice Neurologic:  alert & oriented X3.  Speech normal, gait appropriate for age and unassisted Strength symmetric and appropriate for age.  Psych: Cognition and judgment appear intact.  Cooperative with normal attention  span and concentration.  Behavior appropriate. No anxious or depressed appearing.     Assessment     Assessment  ENDO: Dr Buddy Duty -DM - hypogonadism  HTN Dislipidemia NEURO: see note 08/08/2017 --+ Neuropathy  --RLS type of symptoms -- Headaches, seen neuro before, multiple meds including botox Obesity, Anxiety -- paxil- clonazepam (per Dr  Saunders ) Vitamin D deficiency PULM:  --OSA, CPAP --Chronic cough  DJD, Dr. Marlou Sa GU: Dr Erenest Rasher BPH, ED  , prostatitis  GI: --GERD, status post Nissen fundoplication --Esophageal polyp -- h/o GI bleed- tranfussion (90s)  PLAN: Here for CPX DM: Per Endo Chronic cough, OSA: Sees pulmonary HTN: BP looks very good, recommend to check at home.  Continue amlodipine. High cholesterol: LDL goal 70 (brain MRI with chronic small vessel ischemic changes), last LDL 91, on Crestor 40 mg, recommend Zetia. Multiple questions regards Zetia answer RTC 6 months  In addition to CPX, address his chronic medical problems particularly high cholesterol, multiple questions answered regards the addition of Zetia and his recent labs.

## 2022-07-29 DIAGNOSIS — E119 Type 2 diabetes mellitus without complications: Secondary | ICD-10-CM | POA: Diagnosis not present

## 2022-07-29 DIAGNOSIS — E230 Hypopituitarism: Secondary | ICD-10-CM | POA: Diagnosis not present

## 2022-07-29 LAB — HM DIABETES FOOT EXAM: HM Diabetic Foot Exam: NORMAL

## 2022-07-29 LAB — HEMOGLOBIN A1C: Hemoglobin A1C: 6.7

## 2022-08-31 ENCOUNTER — Encounter: Payer: Self-pay | Admitting: Internal Medicine

## 2022-09-01 MED ORDER — PAROXETINE HCL 20 MG PO TABS: 20.0000 mg | ORAL_TABLET | Freq: Every day | ORAL | 1 refills | Status: DC

## 2022-09-01 NOTE — Telephone Encounter (Signed)
Pt called to check status of message. Informed that we received the message and has been sent to Dr. Drue Novel.

## 2022-09-01 NOTE — Telephone Encounter (Signed)
Rx sent 

## 2022-09-01 NOTE — Addendum Note (Signed)
Addended byConrad Davidson D on: 09/01/2022 03:39 PM   Modules accepted: Orders

## 2022-09-01 NOTE — Telephone Encounter (Signed)
Send 74-month supply of Paxil.  PDMP: Called clonazepam 60 tablets 08/26/2022. Will be able to refill it in few weeks.

## 2022-09-09 DIAGNOSIS — R972 Elevated prostate specific antigen [PSA]: Secondary | ICD-10-CM | POA: Diagnosis not present

## 2022-09-09 LAB — PSA: PSA: 4.94

## 2022-09-10 ENCOUNTER — Other Ambulatory Visit: Payer: Self-pay | Admitting: Internal Medicine

## 2022-09-15 DIAGNOSIS — N401 Enlarged prostate with lower urinary tract symptoms: Secondary | ICD-10-CM | POA: Diagnosis not present

## 2022-09-15 DIAGNOSIS — R310 Gross hematuria: Secondary | ICD-10-CM | POA: Diagnosis not present

## 2022-09-15 DIAGNOSIS — R351 Nocturia: Secondary | ICD-10-CM | POA: Diagnosis not present

## 2022-09-20 ENCOUNTER — Other Ambulatory Visit: Payer: Self-pay | Admitting: Internal Medicine

## 2022-09-22 ENCOUNTER — Encounter: Payer: Self-pay | Admitting: Internal Medicine

## 2022-10-05 DIAGNOSIS — G4733 Obstructive sleep apnea (adult) (pediatric): Secondary | ICD-10-CM | POA: Diagnosis not present

## 2022-10-06 ENCOUNTER — Encounter: Payer: Self-pay | Admitting: Internal Medicine

## 2022-10-08 NOTE — Telephone Encounter (Signed)
Spoke to the pt. He is now out of the window for antiviral and is okay with treating sxs since he feels as if he is turning the corner.

## 2022-10-11 ENCOUNTER — Other Ambulatory Visit: Payer: Self-pay | Admitting: Internal Medicine

## 2022-10-22 IMAGING — DX DG CHEST 2V
2 series · 2 of 2 positions shown · non-contrast
Comparison: 11/15/2018

CLINICAL DATA: Bronchitis

EXAM:
CHEST - 2 VIEW

[chest pa]
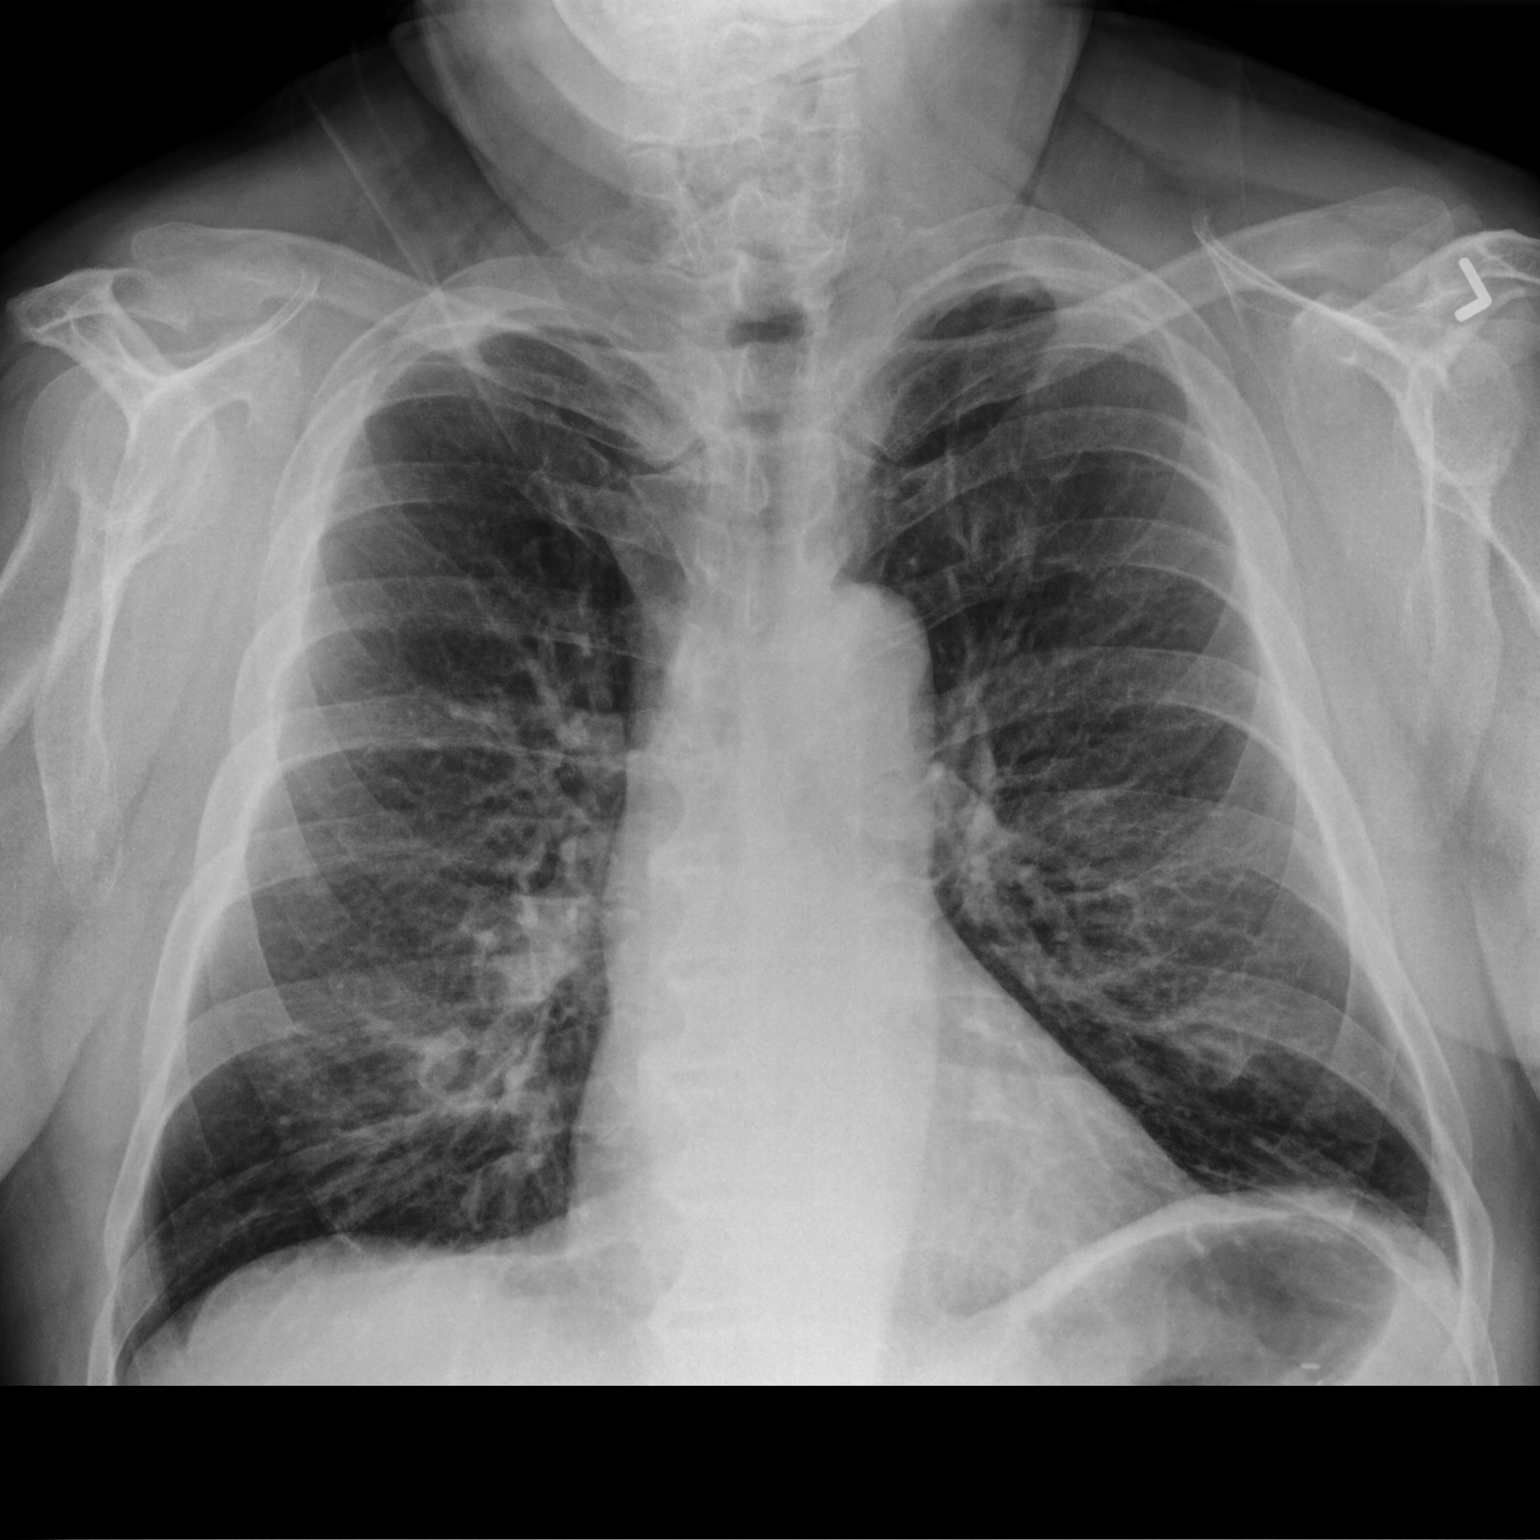

[chest lat]
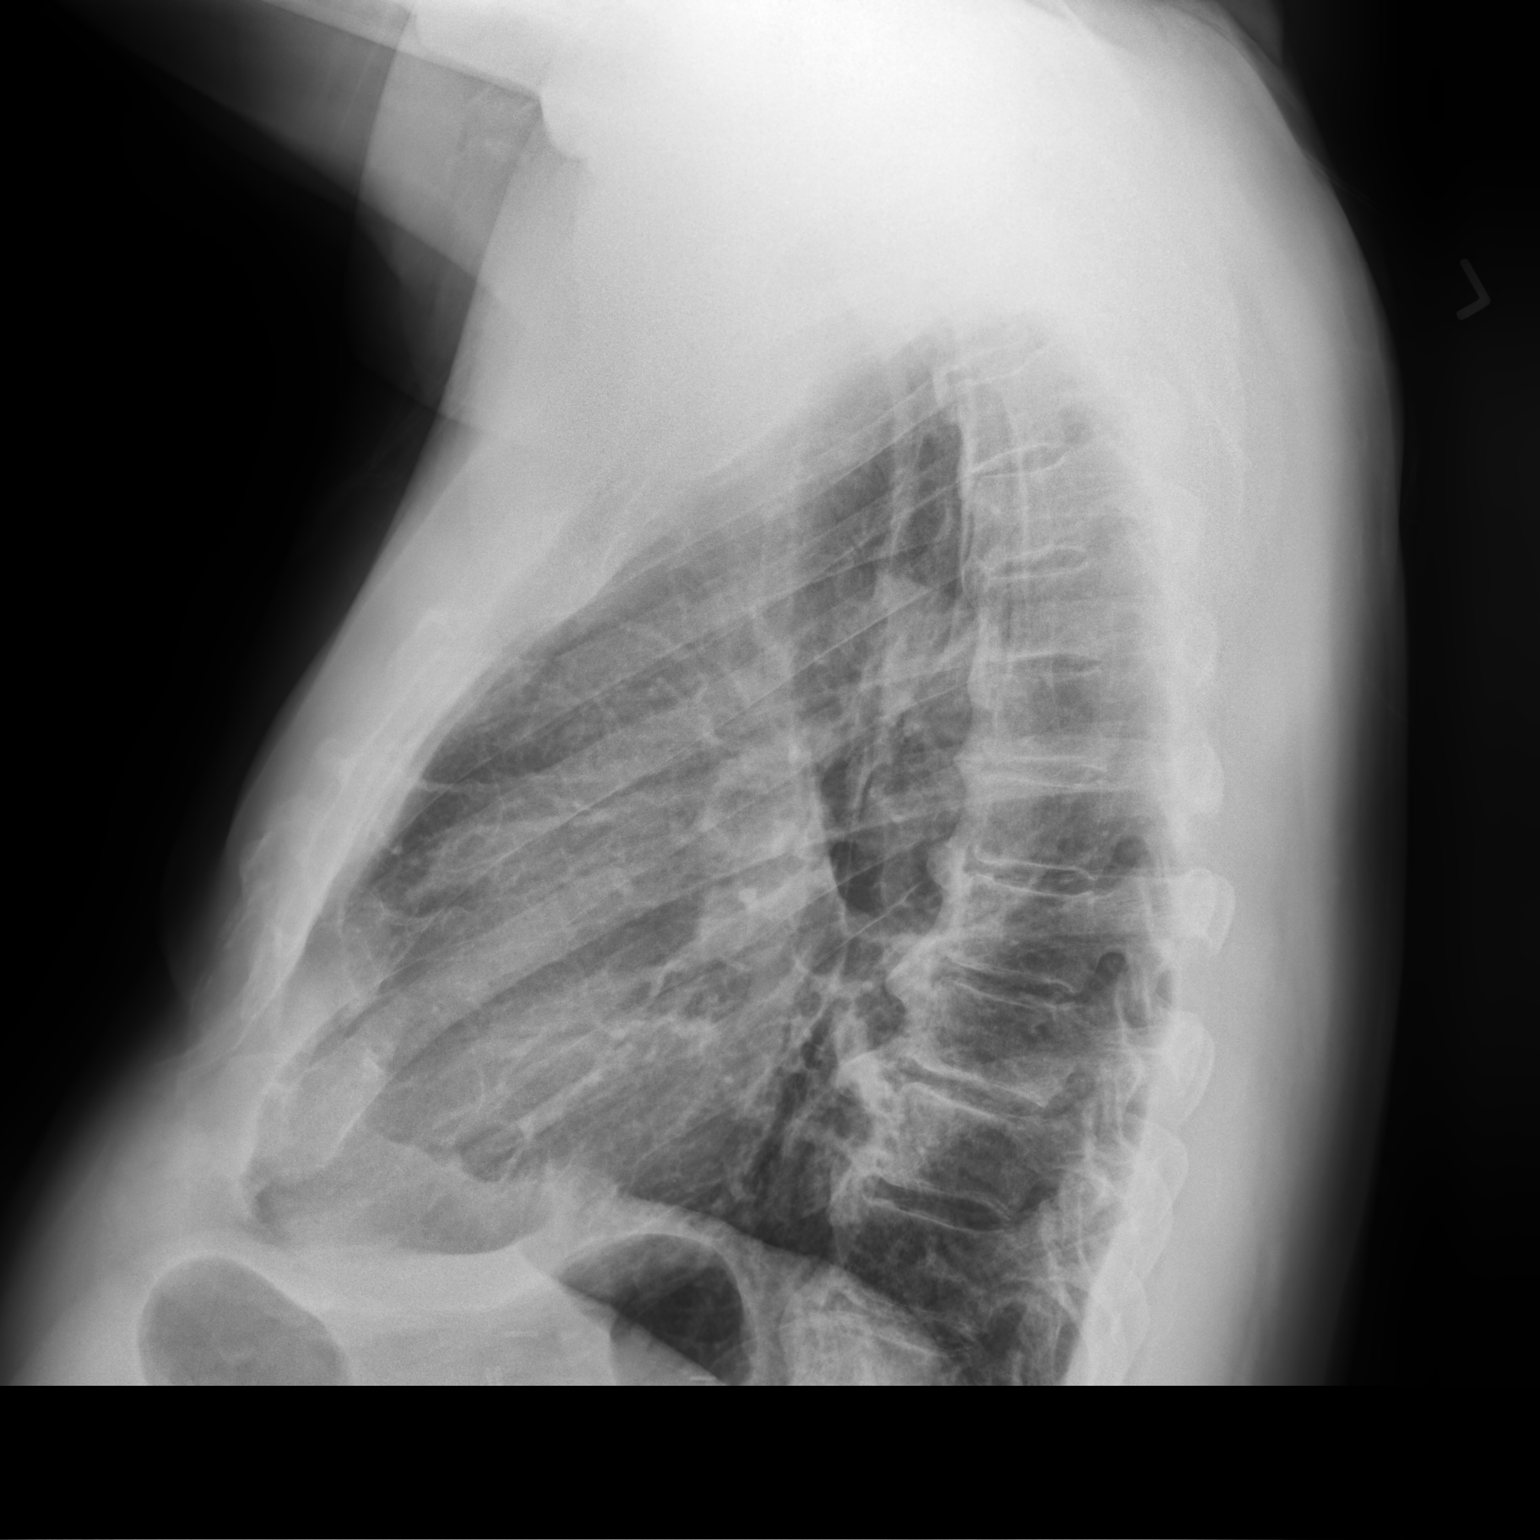

[2 of 2 positions shown; findings below may reference images not displayed]

FINDINGS: Midline trachea. Normal heart size and mediastinal contours. No
pleural effusion or pneumothorax. Mild biapical pleural thickening.
Clear lungs. Surgical clips in the left upper quadrant.
IMPRESSION: No acute cardiopulmonary disease.

## 2022-11-18 ENCOUNTER — Encounter: Payer: Self-pay | Admitting: Internal Medicine

## 2022-11-18 DIAGNOSIS — J42 Unspecified chronic bronchitis: Secondary | ICD-10-CM | POA: Diagnosis not present

## 2022-11-19 ENCOUNTER — Encounter: Payer: Self-pay | Admitting: Internal Medicine

## 2022-12-13 ENCOUNTER — Telehealth: Payer: Self-pay

## 2022-12-13 NOTE — Telephone Encounter (Signed)
Appt scheduled

## 2022-12-13 NOTE — Telephone Encounter (Signed)
Patient called wanting to be worked into Dr. Diamantina Providence schedule for neck and shoulder pain due to a fall on Friday.  Offered for him to see another provided, but prefers Dr. August Saucer due to his wife being a patient of Dr. August Saucer.  Cb# [phone].  Please advise.  Thank you.

## 2022-12-14 ENCOUNTER — Encounter: Payer: Self-pay | Admitting: Physician Assistant

## 2022-12-14 ENCOUNTER — Other Ambulatory Visit (INDEPENDENT_AMBULATORY_CARE_PROVIDER_SITE_OTHER): Payer: Medicare PPO

## 2022-12-14 ENCOUNTER — Ambulatory Visit: Payer: Medicare PPO | Admitting: Physician Assistant

## 2022-12-14 DIAGNOSIS — M542 Cervicalgia: Secondary | ICD-10-CM

## 2022-12-14 DIAGNOSIS — M25512 Pain in left shoulder: Secondary | ICD-10-CM | POA: Diagnosis not present

## 2022-12-14 MED ORDER — METHYLPREDNISOLONE 4 MG PO TBPK: ORAL_TABLET | ORAL | 0 refills | Status: DC

## 2022-12-14 MED ORDER — METHOCARBAMOL 500 MG PO TABS: 500.0000 mg | ORAL_TABLET | Freq: Four times a day (QID) | ORAL | 0 refills | Status: DC | PRN

## 2022-12-14 NOTE — Progress Notes (Signed)
Office Visit Note   Patient: Ronald King           Date of Birth: 03-23-1946           MRN: [id number] Visit Date: 12/14/2022              Requested by: Wanda Plump, MD [address] [place],  [place] [address] PCP: Wanda Plump, MD   Assessment & Plan: Visit Diagnoses:  1. Cervicalgia   2. Left shoulder pain, unspecified chronicity     Plan: Patient is a pleasant 77 year old gentleman who is 4 days status post falling.  He was carrying some groceries in and the light when out in the garage when he fell.  Denies any loss of consciousness.  Is complaining of left-sided neck pain that goes into the base of his skull and down into his shoulder.  Denies any nausea or vomiting he has had some headaches but no loss of balance.  He acts appropriately today.  His wife has been with him since the fall and has not noticed any unusual behavior.  He does have a history of cervical radiculopathy and arthritis that dates back almost 20 years.  He is extremely stiff with range of motion of his neck.  I do not feel any step-off and his strength is actually good.  He may have mild impingement shit symptoms separate in his shoulder but I think most of his issues are with his neck.  I have recommended a course of steroid.  He knows to take this with food and not take other anti-inflammatories we will also give him some Robaxin to take for the spasm.  I was very specific with he and his wife that he had for has any increasing symptoms of weakness if he has any nausea vomiting or persistent headaches I would like for him to follow-up with his primary care.  He already has an appointment with Dr. August Saucer can follow-up with him who he was originally wanting to see.  If he is doing well may cancel this. Follow-Up Instructions: No follow-ups on file.   Orders:  Orders Placed This Encounter  Procedures   XR Cervical Spine 2 or 3 views   XR Shoulder Left   No orders of the defined types were  placed in this encounter.     Procedures: No procedures performed   Clinical Data: No additional findings.   Subjective: Chief Complaint  Patient presents with   Neck - Pain   Left Shoulder - Pain    HPI patient is a pleasant 77 year old gentleman who is 3 days status post fall.  Denies loss of consciousness presents today complaining of neck pain and stiffness and left shoulder pain rates the pain as severe the next day.  Denies any loss of consciousness or any syncopal episode purely mechanical fall after walking in the garage and the lights went off and he tripped over something.  Has some headaches associated that from the back of his neck denies any visual changes any nausea or vomiting.  Has had some decreased appetite since his fall.  His wife has not noticed any behavioral changes  Review of Systems  All other systems reviewed and are negative.    Objective: Vital Signs: There were no vitals taken for this visit.  Physical Exam Constitutional:      Appearance: Normal appearance.  Skin:    General: Skin is warm and dry.  Neurological:  General: No focal deficit present.     Mental Status: He is alert and oriented to person, place, and time.  Psychiatric:        Mood and Affect: Mood normal.        Behavior: Behavior normal.     Ortho Exam Examination of his neck I cannot palpate any crepitus or any step-off.  He does have a lot of spasm and pain in the paravertebral muscles to the left.  He is very stiff with flexion extension and side-to-side turning of his neck secondary to pain.  Grip strength intact triceps biceps strength intact abduction instructed can have overhead motion of his shoulder.  Mild impingement findings.  No sensation changes. Specialty Comments:  No specialty comments available.  Imaging: No results found.   PMFS History: Patient Active Problem List   Diagnosis Date Noted   BPH (benign prostatic hyperplasia) 05/27/2016   PCP NOTES  >>>>>>> 03/17/2015   Chronic cough 01/16/2015   Other malaise and fatigue 01/04/2013   Chronic sinusitis with recurrent bronchitis 06/15/2012   Annual physical exam 06/01/2012   Hyperglycemia 07/30/2011   Hypogonadism, male 11/26/2010   DYSPNEA 02/04/2010   VITAMIN D DEFICIENCY 12/25/2008   Hyperlipidemia 12/25/2008   POLYPS, ESOPHAGEAL 09/16/2008   HEMORRHOIDS, INTERNAL 09/16/2008   ANEMIA, HX OF 09/16/2008   GASTROINTESTINAL HEMORRHAGE, HX OF 09/16/2008   Osteoarthritis 04/17/2008   Neuropathy-- f/u neuro, 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   Hemorrhoids, internal    History of gastrointestinal hemorrhage    Hypertension    Hypogonadism, male    per Dr Sharl Ma   Neuromuscular disorder [facility])    neuropathy feet   Neuropathy    per neuro-Dr Charlsie Quest vicodin   OSA (obstructive sleep apnea)    on CPAP   Sleep apnea    CPAP at night   Vitamin D deficiency     Family History  Problem Relation Age of Onset   Cerebral aneurysm Father    Alcohol abuse Father    Neuropathy Mother    Heart disease Mother        died at age 31   Migraines Brother        several   Diabetes Maternal Uncle    CAD Brother        CABG in his 71s   Colon cancer Neg Hx    Prostate cancer Neg Hx    Esophageal cancer Neg Hx    Stomach cancer Neg Hx    Rectal cancer Neg Hx     Past Surgical History:  Procedure Laterality Date   ANAL FISSURE REPAIR     hand fracture  surgery     left   KNEE ARTHROSCOPY  2007   Dr. August Saucer   LAPAROSCOPIC NISSEN FUNDOPLICATION  05/1999   Dr. Daphine Deutscher   NOSE SURGERY     TONSILLECTOMY  UPPER GASTROINTESTINAL ENDOSCOPY     Social History   Occupational History   Occupation: Regulatory affairs officer, retired    Comment: retired   Occupation: minister    Comment: [employer]  Tobacco Use   Smoking status: Former    Current packs/day: 0.00    Average packs/day: 1 pack/day for 3.0 years (3.0 ttl pk-yrs)    Types: Cigarettes    Start date: 03/29/1962    Quit date: 03/29/1965    Years since quitting: 57.7   Smokeless tobacco: Never  Vaping Use   Vaping status: Never Used  Substance and Sexual Activity   Alcohol use: No   Drug use: No   Sexual activity: Not on file

## 2022-12-15 ENCOUNTER — Ambulatory Visit (HOSPITAL_BASED_OUTPATIENT_CLINIC_OR_DEPARTMENT_OTHER): Payer: Medicare PPO | Admitting: Pulmonary Disease

## 2022-12-15 ENCOUNTER — Encounter (HOSPITAL_BASED_OUTPATIENT_CLINIC_OR_DEPARTMENT_OTHER): Payer: Self-pay | Admitting: Pulmonary Disease

## 2022-12-15 VITALS — BP 138/82 | HR 90 | Ht 73.0 in | Wt 262.0 lb

## 2022-12-15 DIAGNOSIS — G4733 Obstructive sleep apnea (adult) (pediatric): Secondary | ICD-10-CM | POA: Diagnosis not present

## 2022-12-15 DIAGNOSIS — J42 Unspecified chronic bronchitis: Secondary | ICD-10-CM | POA: Diagnosis not present

## 2022-12-15 DIAGNOSIS — J209 Acute bronchitis, unspecified: Secondary | ICD-10-CM

## 2022-12-15 DIAGNOSIS — R053 Chronic cough: Secondary | ICD-10-CM

## 2022-12-15 MED ORDER — HYDROCODONE BIT-HOMATROP MBR 5-1.5 MG/5ML PO SOLN
5.0000 mL | Freq: Four times a day (QID) | ORAL | 0 refills | Status: DC | PRN
Start: 2022-12-15 — End: 2023-07-14

## 2022-12-15 MED ORDER — AMOXICILLIN 500 MG PO TABS
500.0000 mg | ORAL_TABLET | Freq: Three times a day (TID) | ORAL | 1 refills | Status: DC
Start: 2022-12-15 — End: 2023-07-14

## 2022-12-15 MED ORDER — AZITHROMYCIN 250 MG PO TABS: ORAL_TABLET | ORAL | 0 refills | Status: AC

## 2022-12-15 NOTE — Patient Instructions (Signed)
Follow up in 6 months 

## 2022-12-15 NOTE — Progress Notes (Signed)
[facility]  Chief Complaint  Patient presents with   Medical Management of Chronic Issues    Constitutional:  BP 138/82   Pulse 90   Ht 6\' 1"  (1.854 m)   Wt 262 lb (118.8 kg)   SpO2 96%   BMI 34.57 kg/m   Past Medical History:  HTN, HLD, GERD, IBS, hemorrhoids, BPH, ED, DJD, HA, Vit D deficiency, Neuropathy, Anxiety  Past Surgical History:  He  has a past surgical history that includes Laparoscopic Nissen fundoplication (05/1999); Knee arthroscopy (2007); Tonsillectomy; Nose surgery; Anal fissure repair; hand fracture surgery; and Upper gastrointestinal endoscopy.  Brief Summary:  Ronald King is a 77 y.o. male former smoker with cough and obstructive sleep apnea.  Former patient of Dr. Kriste Basque.      Subjective:   He saw Azalee Course in March.  Was treated with a Zpak and prednisone.  Chest xray from then showed bronchial cuffing consistent with bronchitis.  He then got COVID a few weeks ago.  His left lung was burning.  He had to take a Zpak and eventually started feeling better.  He still has a cough, but this is his usual cough now.  Physical Exam:   Appearance - well kempt   ENMT - no sinus tenderness, no oral exudate, no LAN, Mallampati 3 airway, no stridor  Respiratory - equal breath sounds bilaterally, no wheezing or rales  CV - s1s2 regular rate and rhythm, 2/6 SM  Ext - no clubbing, no edema  Skin - no rashes  Psych - normal mood and affect      Pulmonary testing:  Spirometry September 2013 >> FEV 1 3.46 (86%), FEV1% 87 RAST 11/15/18 >> grasses, IgE 87 PFT 12/25/18 >> FEV1 3.13 (91%), FEV1% 86, TLC 6.14 (82%), DLCO 103%, +BD from FEF 25-75  Sleep Tests:  PSG 09/02/97 >> AHI 3 CPAP 09/24/21 to 12/22/21 >> used on 87 of 90 nights with average 7 hrs 40 min.  Average AHI 5.7 with CPAP 16 cm H2O   Cardiac Tests:  Echo 01/29/21 >> EF 55 to 60%, aortic root 36 mm  Social History:  He  reports that he quit  smoking about 57 years ago. His smoking use included cigarettes. He started smoking about 60 years ago. He has a 3 pack-year smoking history. He has never used smokeless tobacco. He reports that he does not drink alcohol and does not use drugs.  Family History:  His family history includes Alcohol abuse in his father; CAD in his brother; Cerebral aneurysm in his father; Diabetes in his maternal uncle; Heart disease in his mother; Migraines in his brother; Neuropathy in his mother.     Assessment/Plan:   Chronic cough. 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 - previously seen by Dr. Jearld Fenton with ENT - nasal steroids sprays reported to be ineffective for him - prn afrin and nasal irrigation - advised him to limit use of afrin  Sleep apnea, NOS. - he is compliant with CPAP and reports benefit - uses [facility] for his DME - continue CPAP 16 cm H2O   GERD. - followed by Dr. Lavon Paganini with GI  Time Spent Involved in Patient Care on Day of Examination:  27 minutes  Follow up:   Patient Instructions  Follow up in 6 months  Medication List:   Allergies as of 12/15/2022       Reactions   Montelukast Shortness Of Breath   Ceftin [cefuroxime Axetil]    Duloxetine    Other reaction(s): Other (See Comments) refuses   Lyrica [pregabalin] Other (See Comments)   Drowsiness   Metformin Hcl    Other reaction(s): Unknown   Neurontin [gabapentin] Other (See Comments)   Drowsiness    Testosterone Other (See Comments)    HTN        Medication List        Accurate as of December 15, 2022  8:59 AM. If you have any questions, ask your nurse or doctor.          STOP taking these medications    ezetimibe 10 MG tablet Commonly known as: Zetia Stopped by: Coralyn Helling       TAKE these medications    amLODipine 10 MG tablet Commonly known as: NORVASC Take 1 tablet (10 mg total) by mouth daily.   amoxicillin 500 MG tablet Commonly known as: AMOXIL Take 1 tablet (500 mg total) by mouth 3 (three) times daily with meals. As needed for bronchitis flares   ascorbic acid 500 MG tablet Commonly known as: VITAMIN C Take 1,000 mg by mouth daily.   aspirin EC 81 MG tablet Take 1 tablet (81 mg total) by mouth daily. Swallow whole.   Azelastine-Fluticasone 137-50 MCG/ACT Susp Place 1 spray into the nose in the morning and at bedtime.   azithromycin 250 MG tablet Commonly known as: ZITHROMAX Take 2 tablets (500 mg total) by mouth daily for 1 day, THEN 1 tablet (250 mg total) daily for 4 days. Start taking on: December 15, 2022 Started by: Coralyn Helling   B-12 2000 MCG Tabs Take 1 tablet by mouth daily.   budesonide 0.5 MG/2ML nebulizer solution Commonly known as: Pulmicort Take 2 mLs (0.5 mg total) by nebulization 2 (two) times daily as needed (shortness of breath, wheezing, cough).   Calcium Carbonate-Vitamin D 600-400 MG-UNIT tablet Take 1 tablet by mouth daily.   CENTRUM SILVER PO Take 1 tablet by mouth daily. Reported on 03/17/2015   clonazePAM 1 MG tablet Commonly known as: KLONOPIN Take 1 mg by mouth 2 (two) times daily.   CO Q 10 PO Take 1 tablet by mouth daily.   fenofibrate 145 MG tablet Commonly known as: TRICOR Take 1 tablet (145 mg total) by mouth daily.   HYDROcodone bit-homatropine 5-1.5 MG/5ML syrup Commonly known as: HYCODAN Take 5 mLs by mouth every 6 (six) hours as needed for cough.   lansoprazole 30 MG capsule Commonly known as: PREVACID TAKE 1 CAPSULE(30  MG) BY MOUTH TWICE DAILY BEFORE A MEAL   methocarbamol 500 MG tablet Commonly known as: ROBAXIN Take 1 tablet (500 mg total) by mouth every 6 (six) hours as needed for muscle spasms.   methylPREDNISolone 4 MG Tbpk tablet Commonly known as: MEDROL DOSEPAK Take as directed with food   PARoxetine 20 MG tablet Commonly known as: PAXIL Take 1 tablet (20 mg total) by mouth daily.   pyridOXINE 100 MG tablet Commonly known as: VITAMIN  B6 Take 100 mg by mouth daily.   rosuvastatin 40 MG tablet Commonly known as: CRESTOR Take 1 tablet (40 mg total) by mouth at bedtime.   sucralfate 1 g tablet Commonly known as: CARAFATE TAKE 1 TABLET(1 GRAM) BY MOUTH TWICE DAILY What changed: See the new instructions.   Trulicity 0.75 MG/0.5ML Sopn Generic drug: Dulaglutide Inject 0.75 mg into the skin once a week.        Signature:  Coralyn Helling, MD [facility]monary/Critical Care Pager - [phone] 12/15/2022, 8:59 AM

## 2022-12-19 DIAGNOSIS — J42 Unspecified chronic bronchitis: Secondary | ICD-10-CM | POA: Diagnosis not present

## 2022-12-24 ENCOUNTER — Ambulatory Visit: Payer: Medicare PPO | Admitting: Orthopedic Surgery

## 2022-12-27 ENCOUNTER — Encounter: Payer: Self-pay | Admitting: Orthopedic Surgery

## 2022-12-27 ENCOUNTER — Ambulatory Visit: Payer: Medicare PPO | Admitting: Orthopedic Surgery

## 2022-12-27 DIAGNOSIS — M542 Cervicalgia: Secondary | ICD-10-CM | POA: Diagnosis not present

## 2022-12-27 MED ORDER — PREDNISONE 5 MG (21) PO TBPK: ORAL_TABLET | ORAL | 0 refills | Status: DC

## 2022-12-27 NOTE — Progress Notes (Unsigned)
Office Visit Note   Patient: Ronald King           Date of Birth: [birth year]           MRN: [id number] Visit Date: 12/27/2022 Requested by: Wanda Plump, MD [address] [place],  [place] [address] PCP: Wanda Plump, MD  Subjective: Chief Complaint  Patient presents with   Left Shoulder - Pain    HPI: Ronald King is a 77 y.o. male who presents to the office reporting left shoulder and neck pain for 3 weeks duration.  He twisted it while he was working in the garage and he caught himself from falling.  Had similar problem about 20 years ago which resolved with high-dose steroids.  Neurosurgical evaluation at that time determined it to be radiculopathy.  He does report some radicular pain including pain radiating in the trapezial region as well as up the back of his head.  Does report decreased range of motion.  Pain wakes him from sleep at night.  He has had 1 Dosepak and muscle relaxer with some relief.  Describes limited head movement and rotation to the left reproduces symptoms.  He is going to [place] in 3 weeks.  He did do cervical traction in the past with his previous problem..                ROS: All systems reviewed are negative as they relate to the chief complaint within the history of present illness.  Patient denies fevers or chills.  Assessment & Plan: Visit Diagnoses:  1. Cervicalgia     Plan: Impression is cervical spine radiculopathy with normal shoulder exam and radiographs.  Does have some anterior osteophytes in the cervical spine.  Would like to try him on a second 6-day steroid Dosepak and get him set up for MRI scan of the cervical spine so that we can consider epidural steroid injections if needed.  Previous radiographs of the shoulder on the left normal and radiographs of the cervical spine do show some anterior osteophyte formation as well as C56 degenerative arthritis  Follow-Up Instructions: No follow-ups on file.   Orders:   Orders Placed This Encounter  Procedures   MR Cervical Spine w/o contrast   Meds ordered this encounter  Medications   predniSONE (STERAPRED UNI-PAK 21 TAB) 5 MG (21) TBPK tablet    Sig: Take dosepak as directed    Dispense:  21 tablet    Refill:  0      Procedures: No procedures performed   Clinical Data: No additional findings.  Objective: Vital Signs: There were no vitals taken for this visit.  Physical Exam:  Constitutional: Patient appears well-developed HEENT:  Head: Normocephalic Eyes:EOM are normal Neck: Normal range of motion Cardiovascular: Normal rate Pulmonary/chest: Effort normal Neurologic: Patient is alert Skin: Skin is warm Psychiatric: Patient has normal mood and affect  Ortho Exam: Ortho exam demonstrates flexion chin to chest extensions only about 10 degrees.  Rotation to the right is 45 rotation to the left is about 10 to 15 degrees.  Has 5 out of 5 grip EPL FPL interosseous wrist flexion extension bicep triceps and deltoid strength.  Bilateral shoulder range of motion is 60/100/165 bilaterally.  Rotator cuff strength on the left is excellent infraspinatus supraspinatus and subscap muscle testing.  No definite paresthesias C5-T1.  Specialty Comments:  No specialty comments available.  Imaging: No results found.   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   DYSPNEA 02/04/2010   VITAMIN D DEFICIENCY 12/25/2008   Hyperlipidemia 12/25/2008   POLYPS, ESOPHAGEAL 09/16/2008   HEMORRHOIDS, INTERNAL 09/16/2008   ANEMIA, HX OF 09/16/2008   GASTROINTESTINAL HEMORRHAGE, HX OF 09/16/2008   Osteoarthritis 04/17/2008   Neuropathy-- f/u  neuro, 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   Hemorrhoids, internal    History of gastrointestinal hemorrhage    Hypertension    Hypogonadism, male    per Dr Sharl Ma   Neuromuscular disorder [facility])    neuropathy feet   Neuropathy    per neuro-Dr Charlsie Quest vicodin   OSA (obstructive sleep apnea)    on CPAP   Sleep apnea    CPAP at night   Vitamin D deficiency     Family History  Problem Relation Age of Onset   Cerebral aneurysm Father    Alcohol abuse Father    Neuropathy Mother    Heart disease Mother        died at age 83   Migraines Brother        several   Diabetes Maternal Uncle    CAD Brother        CABG in his 57s   Colon cancer Neg Hx    Prostate cancer Neg Hx    Esophageal cancer Neg Hx    Stomach cancer Neg Hx    Rectal cancer Neg Hx     Past Surgical History:  Procedure Laterality Date   ANAL FISSURE REPAIR     hand fracture surgery     left   KNEE ARTHROSCOPY  2007   Dr. August Saucer   LAPAROSCOPIC NISSEN FUNDOPLICATION  05/1999   Dr. Daphine Deutscher   NOSE SURGERY     TONSILLECTOMY     UPPER GASTROINTESTINAL ENDOSCOPY     Social History   Occupational History   Occupation: Regulatory affairs officer, retired    Comment: retired   Occupation: minister    Comment: [employer]  Tobacco Use   Smoking status: Former    Current packs/day: 0.00    Average packs/day: 1 pack/day for 3.0 years (3.0 ttl pk-yrs)    Types: Cigarettes     Start date: 03/29/1962    Quit date: 03/29/1965    Years since quitting: 57.7   Smokeless tobacco: Never  Vaping Use   Vaping status: Never Used  Substance and Sexual Activity   Alcohol use: No   Drug use: No   Sexual activity: Not on file

## 2022-12-29 ENCOUNTER — Encounter: Payer: Self-pay | Admitting: Orthopedic Surgery

## 2022-12-30 ENCOUNTER — Other Ambulatory Visit: Payer: Medicare PPO

## 2022-12-30 ENCOUNTER — Ambulatory Visit
Admission: RE | Admit: 2022-12-30 | Discharge: 2022-12-30 | Disposition: A | Discharge status: Home / Self Care | Payer: Medicare PPO | Source: Ambulatory Visit | Attending: Orthopedic Surgery | Admitting: Orthopedic Surgery

## 2022-12-30 DIAGNOSIS — M50021 Cervical disc disorder at C4-C5 level with myelopathy: Secondary | ICD-10-CM | POA: Diagnosis not present

## 2022-12-30 DIAGNOSIS — M542 Cervicalgia: Secondary | ICD-10-CM

## 2023-01-03 ENCOUNTER — Encounter: Payer: Self-pay | Admitting: Internal Medicine

## 2023-01-03 ENCOUNTER — Ambulatory Visit (INDEPENDENT_AMBULATORY_CARE_PROVIDER_SITE_OTHER): Payer: Medicare PPO | Admitting: Internal Medicine

## 2023-01-03 VITALS — BP 130/72 | HR 67 | Temp 97.8°F | Resp 18 | Ht 73.0 in | Wt 259.2 lb

## 2023-01-03 DIAGNOSIS — F411 Generalized anxiety disorder: Secondary | ICD-10-CM | POA: Diagnosis not present

## 2023-01-03 DIAGNOSIS — I10 Essential (primary) hypertension: Secondary | ICD-10-CM | POA: Diagnosis not present

## 2023-01-03 DIAGNOSIS — Z79899 Other long term (current) drug therapy: Secondary | ICD-10-CM | POA: Diagnosis not present

## 2023-01-03 DIAGNOSIS — E785 Hyperlipidemia, unspecified: Secondary | ICD-10-CM

## 2023-01-03 LAB — ALT: ALT: 16 U/L (ref 0–53)

## 2023-01-03 LAB — CBC WITH DIFFERENTIAL/PLATELET
Basophils Absolute: 0 10*3/uL (ref 0.0–0.1)
Basophils Relative: 0.3 % (ref 0.0–3.0)
Eosinophils Absolute: 0.1 10*3/uL (ref 0.0–0.7)
Eosinophils Relative: 0.9 % (ref 0.0–5.0)
HCT: 45.5 % (ref 39.0–52.0)
Hemoglobin: 15.1 g/dL (ref 13.0–17.0)
Lymphocytes Relative: 13.4 % (ref 12.0–46.0)
Lymphs Abs: 1.2 10*3/uL (ref 0.7–4.0)
MCHC: 33.2 g/dL (ref 30.0–36.0)
MCV: 91.7 fL (ref 78.0–100.0)
Monocytes Absolute: 0.7 10*3/uL (ref 0.1–1.0)
Monocytes Relative: 7.5 % (ref 3.0–12.0)
Neutro Abs: 7.1 10*3/uL (ref 1.4–7.7)
Neutrophils Relative %: 77.9 % — ABNORMAL HIGH (ref 43.0–77.0)
Platelets: 207 10*3/uL (ref 150.0–400.0)
RBC: 4.96 Mil/uL (ref 4.22–5.81)
RDW: 14.2 % (ref 11.5–15.5)
WBC: 9.2 10*3/uL (ref 4.0–10.5)

## 2023-01-03 LAB — LIPID PANEL
Cholesterol: 157 mg/dL (ref 0–200)
HDL: 56.8 mg/dL (ref >39.00)
LDL Cholesterol: 85 mg/dL (ref 0–99)
NonHDL: 100.6
Total CHOL/HDL Ratio: 3
Triglycerides: 79 mg/dL (ref 0.0–149.0)
VLDL: 15.8 mg/dL (ref 0.0–40.0)

## 2023-01-03 LAB — BASIC METABOLIC PANEL
BUN: 16 mg/dL (ref 6–23)
CO2: 32 meq/L (ref 19–32)
Calcium: 9.3 mg/dL (ref 8.4–10.5)
Chloride: 102 meq/L (ref 96–112)
Creatinine, Ser: 1.14 mg/dL (ref 0.40–1.50)
GFR: 62.56 mL/min (ref >60.00)
Glucose, Bld: 120 mg/dL — ABNORMAL HIGH (ref 70–99)
Potassium: 4 meq/L (ref 3.5–5.1)
Sodium: 142 meq/L (ref 135–145)

## 2023-01-03 LAB — AST: AST: 19 U/L (ref 0–37)

## 2023-01-03 MED ORDER — CLONAZEPAM 1 MG PO TABS: 1.0000 mg | ORAL_TABLET | Freq: Two times a day (BID) | ORAL | 1 refills | Status: DC

## 2023-01-03 NOTE — Patient Instructions (Addendum)
Vaccines I recommend: RSV vaccine Pneumonia shot (PNM 20). COVID booster Flu shot a week or so after you finish prednisone  You are overdue for colonoscopy, please consider it.   Check the  blood pressure regularly Blood pressure goal:  between 110/65 and  135/85. If it is consistently higher or lower, let me know     GO TO THE LAB : Get the blood work     Next visit with me in 4 months, routine checkup.  Please arrange at the front desk

## 2023-01-03 NOTE — Progress Notes (Signed)
Subjective:    Patient ID: Ronald King, male    DOB: 17-May-1945, 77 y.o.   MRN: [id number]  DOS:  01/03/2023 Type of visit - description: ROV  Chronic medical problems addressed. About 4 weeks ago had a mechanical fall at home.  No LOC.  Developed neck pain, saw orthopedics, on prednisone, MRI pending.   Review of Systems See above   Past Medical History:  Diagnosis Date   Allergy    Anemia    Anxiety    Blood transfusion without reported diagnosis    Chronic bronchitis    Chronic cough    Diabetes (HCC) 07-2011   per Dr Sharl Ma   DJD (degenerative joint disease)    Dyslipidemia    Esophageal polyp    GERD (gastroesophageal reflux disease)    s/p [employer] F.   Hemorrhoids, internal    History of gastrointestinal hemorrhage    Hypertension    Hypogonadism, male    per Dr Sharl Ma   Neuromuscular disorder [facility])    neuropathy feet   Neuropathy    per neuro-Dr Charlsie Quest vicodin   OSA (obstructive sleep apnea)    on CPAP   Sleep apnea    CPAP at night   Vitamin D deficiency     Past Surgical History:  Procedure Laterality Date   ANAL FISSURE REPAIR     hand fracture surgery     left   KNEE ARTHROSCOPY  2007   Dr. August Saucer   LAPAROSCOPIC NISSEN FUNDOPLICATION  05/1999   Dr. Daphine Deutscher   NOSE SURGERY     TONSILLECTOMY     UPPER GASTROINTESTINAL ENDOSCOPY      Current Outpatient Medications  Medication Instructions   amLODipine (NORVASC) 10 mg, Oral, Daily   amoxicillin (AMOXIL) 500 mg, Oral, 3 times daily with meals, As needed for bronchitis flares   ascorbic acid (VITAMIN C) 1,000 mg, Oral, Daily   aspirin EC 81 mg, Oral, Daily, Swallow whole.   Azelastine-Fluticasone 137-50 MCG/ACT SUSP 1 spray, Nasal, 2 times daily   budesonide (PULMICORT) 0.5 mg, Nebulization, 2 times daily PRN   Calcium Carbonate-Vitamin D 600-400 MG-UNIT tablet 1 tablet, Oral, Daily,     clonazePAM (KLONOPIN) 1 mg, Oral, 2 times daily,     Coenzyme Q10 (CO Q 10 PO) 1 tablet, Oral, Daily    Cyanocobalamin (B-12) 2000 MCG TABS 1 tablet, Oral, Daily   fenofibrate (TRICOR) 145 mg, Oral, Daily   HYDROcodone bit-homatropine (HYCODAN) 5-1.5 MG/5ML syrup 5 mLs, Oral, Every 6 hours PRN   lansoprazole (PREVACID) 30 MG capsule TAKE 1 CAPSULE(30 MG) BY MOUTH TWICE DAILY BEFORE A MEAL   methocarbamol (ROBAXIN) 500 mg, Oral, Every 6 hours PRN   methylPREDNISolone (MEDROL DOSEPAK) 4 MG TBPK tablet Take as directed with food   Multiple Vitamins-Minerals (CENTRUM SILVER PO) 1 tablet, Oral, Daily, Reported on 03/17/2015   PARoxetine (PAXIL) 20 mg, Oral, Daily   predniSONE (STERAPRED UNI-PAK 21 TAB) 5 MG (21) TBPK tablet Take dosepak as directed   pyridOXINE (VITAMIN B6) 100 mg, Oral, Daily   rosuvastatin (CRESTOR) 40 mg, Oral, Daily at bedtime   sucralfate (CARAFATE) 1 g tablet TAKE 1 TABLET(1 GRAM) BY MOUTH TWICE DAILY   Trulicity 0.75 mg, Subcutaneous, Weekly       Objective:   Physical Exam BP 130/72   Pulse 67   Temp 97.8 F (36.6 C) (Oral)   Resp 18   Ht 6\' 1"  (1.854 m)   Wt 259 lb 4 oz (117.6 kg)  SpO2 97%   BMI 34.20 kg/m  General:   Well developed, NAD, BMI noted. HEENT:  Normocephalic . Face symmetric, atraumatic Lungs:  CTA B Normal respiratory effort, no intercostal retractions, no accessory muscle use. Heart: RRR,  no murmur.  Lower extremities: no pretibial edema bilaterally  Skin: Not pale. Not jaundice Neurologic:  alert & oriented X3.  Speech normal, gait appropriate for age and unassisted.  Transferring without help but is slightly limited Psych--  Cognition and judgment appear intact.  Cooperative with normal attention span and concentration.  Behavior appropriate. No anxious or depressed appearing.      Assessment    Assessment  ENDO: Dr Sharl Ma -DM - hypogonadism  HTN Dislipidemia NEURO: see note 08/08/2017 --+ Neuropathy  --RLS type of symptoms -- Headaches, seen neuro before, multiple meds including botox Obesity, Anxiety -- paxil-  clonazepam, Dr Lelon Perla retired, PCP started to prescribe 12-2022 Vitamin D deficiency PULM:  --OSA, CPAP --Chronic cough  DJD, Dr. August Saucer GU: Dr Mariana Arn BPH, ED  , prostatitis  GI: --GERD, status post Nissen fundoplication --Esophageal polyp -- h/o GI bleed- tranfussion (90s)  PLAN: HTN: Seems well-controlled, continue amlodipine.  Check a BMP and CBC. High cholesterol: see LOV, on Crestor and Tricor, LDL was 91, was rec to add  Zetia but never started, states is somewhat overwhelmed about polypharmacy, will recheck FLP, add Zetia? Pulmonary: LOV with pulmonary 12/15/2022.  Good compliance with CPAP. Anxiety - On Paxil and clonazepam for over 30 years, previous prescriber Dr. Lelon Perla retired, request me to take over.  UDS and contract today.  PDMP okay, clonazepam Rx sent Mechanical fall: Had a fall 4 weeks ago, seen by Ortho, offered PT, declined . Vit D deficiency: Last level very good. Preventive care: Vaccine advice provided. Due for colonoscopy, rec to consider , see AVS. RTC 4 months

## 2023-01-03 NOTE — Assessment & Plan Note (Signed)
HTN: Seems well-controlled, continue amlodipine.  Check a BMP and CBC. High cholesterol: see LOV, on Crestor and Tricor, LDL was 91, was rec to add  Zetia but never started, states is somewhat overwhelmed about polypharmacy, will recheck FLP, add Zetia? Pulmonary: LOV with pulmonary 12/15/2022.  Good compliance with CPAP. Anxiety - On Paxil and clonazepam for over 30 years, previous prescriber Dr. Lelon Perla retired, request me to take over.  UDS and contract today.  PDMP okay, clonazepam Rx sent Mechanical fall: Had a fall 4 weeks ago, seen by Ortho, offered PT, declined . Vit D deficiency: Last level very good. Preventive care: Vaccine advice provided. Due for colonoscopy, rec to consider , see AVS. RTC 4 months

## 2023-01-04 MED ORDER — EZETIMIBE 10 MG PO TABS: 10.0000 mg | ORAL_TABLET | Freq: Every day | ORAL | 3 refills | Status: DC

## 2023-01-04 NOTE — Addendum Note (Signed)
Addended byConrad [place] D on: 01/04/2023 10:43 AM   Modules accepted: Orders

## 2023-01-05 LAB — DRUG MONITORING PANEL 375977 , URINE

## 2023-01-05 LAB — DM TEMPLATE

## 2023-01-07 ENCOUNTER — Encounter: Payer: Self-pay | Admitting: Orthopedic Surgery

## 2023-01-07 DIAGNOSIS — M542 Cervicalgia: Secondary | ICD-10-CM

## 2023-01-10 NOTE — Telephone Encounter (Signed)
Can we get MRI read by radiology?

## 2023-01-11 ENCOUNTER — Telehealth: Payer: Self-pay

## 2023-01-11 NOTE — Telephone Encounter (Signed)
Done. See pt. Portal message

## 2023-01-11 NOTE — Telephone Encounter (Signed)
ESI referral placed in chart

## 2023-01-11 NOTE — Telephone Encounter (Signed)
Patient would like to proceed with injection.  Please submit referral per patient.  Thank you.

## 2023-01-18 DIAGNOSIS — J42 Unspecified chronic bronchitis: Secondary | ICD-10-CM | POA: Diagnosis not present

## 2023-01-19 ENCOUNTER — Other Ambulatory Visit: Payer: Self-pay

## 2023-01-19 ENCOUNTER — Ambulatory Visit: Payer: Medicare PPO | Admitting: Physical Medicine and Rehabilitation

## 2023-01-19 VITALS — BP 145/82 | HR 94

## 2023-01-19 DIAGNOSIS — G8929 Other chronic pain: Secondary | ICD-10-CM

## 2023-01-19 DIAGNOSIS — M542 Cervicalgia: Secondary | ICD-10-CM

## 2023-01-19 DIAGNOSIS — M5412 Radiculopathy, cervical region: Secondary | ICD-10-CM

## 2023-01-19 DIAGNOSIS — M25512 Pain in left shoulder: Secondary | ICD-10-CM

## 2023-01-19 MED ORDER — METHYLPREDNISOLONE ACETATE 40 MG/ML IJ SUSP
40.0000 mg | Freq: Once | INTRAMUSCULAR | Status: AC
Start: 2023-01-19 — End: 2023-01-19
  Administered 2023-01-19: 40 mg

## 2023-01-19 NOTE — Patient Instructions (Signed)

## 2023-01-19 NOTE — Progress Notes (Signed)
Functional Pain Scale - descriptive words and definitions  Moderate (4)   Constantly aware of pain, can complete ADLs with modification/sleep marginally affected at times/passive distraction is of no use, but active distraction gives some relief. Moderate range order  Average Pain 4   +Driver, -BT, -Dye Allergies.  Neck pain on left side that radiates to the shoulder

## 2023-01-23 ENCOUNTER — Other Ambulatory Visit: Payer: Self-pay | Admitting: Gastroenterology

## 2023-01-26 ENCOUNTER — Encounter: Payer: Self-pay | Admitting: Physical Medicine and Rehabilitation

## 2023-01-26 NOTE — Procedures (Signed)
Cervical Epidural Steroid Injection - Interlaminar Approach with Fluoroscopic Guidance  Patient: Ronald King      Date of Birth: 05/14/1945 MRN: [id number] PCP: Wanda Plump, MD      Visit Date: 01/19/2023   Universal Protocol:    Date/Time: 10/30/247:38 AM  Consent Given By: the patient  Position: PRONE  Additional Comments: Vital signs were monitored before and after the procedure. Patient was prepped and draped in the usual sterile fashion. The correct patient, procedure, and site was verified.   Injection Procedure Details:   Procedure diagnoses: Cervical radiculopathy [M54.12]    Meds Administered:  Meds ordered this encounter  Medications   methylPREDNISolone acetate (DEPO-MEDROL) injection 40 mg     Laterality: Left  Location/Site: C7-T1  Needle: 3.5 in., 20 ga. Tuohy  Needle Placement: Paramedian epidural space  Findings:  -Comments: Excellent flow of contrast into the epidural space.  Procedure Details: Using a paramedian approach from the side mentioned above, the region overlying the inferior lamina was localized under fluoroscopic visualization and the soft tissues overlying this structure were infiltrated with 4 ml. of 1% Lidocaine without Epinephrine. A # 20 gauge, Tuohy needle was inserted into the epidural space using a paramedian approach.  The epidural space was localized using loss of resistance along with contralateral oblique bi-planar fluoroscopic views.  After negative aspirate for air, blood, and CSF, a 2 ml. volume of Isovue-250 was injected into the epidural space and the flow of contrast was observed. Radiographs were obtained for documentation purposes.   The injectate was administered into the level noted above.  Additional Comments:  No complications occurred Dressing: 2 x 2 sterile gauze and Band-Aid    Post-procedure details: Patient was observed during the procedure. Post-procedure instructions were reviewed.  Patient  left the clinic in stable condition.

## 2023-01-26 NOTE — Progress Notes (Signed)
Ronald King - 77 y.o. male MRN [id number]  Date of birth: 1945/07/03  Office Visit Note: Visit Date: 01/19/2023 PCP: Wanda Plump, MD Referred by: Wanda Plump, MD  Subjective: Chief Complaint  Patient presents with   Neck - Pain   HPI: Ronald King is a 77 y.o. male who comes in today for evaluation and management at the request of Dr. Burnard Bunting for chronic neck and arm pain but with more recent exacerbation of neck and shoulder pain on the left.  He reports to me a history of many years ago having a similar complaint and was seen by neurosurgery and they felt like it was a radiculopathy radiculitis at the time and he underwent high-dose steroids that seemed to help and he is really not had much in the way of issues since then.  He reports several weeks ago now onset of left-sided neck and shoulder pain.  Pain can be all the time but it can be worse with movement of the shoulder.  He gets some pain in the trapezius area and lateral deltoid.  Get some referral at times into the arm.  Some paresthesia.  He has no symptoms on the right.  He is right-hand dominant.  He has a history of diabetes although it is well-controlled now has a history of polyneuropathy.  He has not gotten relief with medication management and has not done well in the past with nerve membrane stabilizing medication such as Lyrica and gabapentin.  Those are listed as intolerances.  He has had MRI of the cervical spine and this is reviewed with him today with images and spine models.  This does show cord signal changes at C4-5 with stenosis.  We did talk at length about this condition and what it meant in terms of problems with spinal cord injury.  He is not having much in the way of symptoms of myelopathy.  He is still ambulating well he has had no real neurologic issues other than the pain in his neck.  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   I spent more than 30  minutes speaking face-to-face with the patient with 50% of the time in counseling and discussing coordination of care.       Review of Systems  Musculoskeletal:  Positive for back pain, joint pain and neck pain.  Neurological:  Positive for tingling and weakness.  All other systems reviewed and are negative.  Otherwise per HPI.  Assessment & Plan: Visit Diagnoses:    ICD-10-CM   1. Cervical radiculopathy  M54.12 XR C-ARM NO REPORT    Epidural Steroid injection    methylPREDNISolone acetate (DEPO-MEDROL) injection 40 mg    2. Cervicalgia  M54.2     3. Chronic left shoulder pain  M25.512    G89.29        Plan: Findings:  Complicated picture of acute onset neck and shoulder pain on top of chronic issue with history of radiculopathy on the left.  Has left-sided facet joint arthritis which could definitely cause him some neck pain and pain into the trapezius area and sometimes that refers up into the occiput.  He does get pain down into the shoulder and arm.  He clearly has significant stenosis at C4-5.  At this point given his pain level it would be recommended to complete cervical epidural injection which we performed today and this is noted below.  He  really probably should see Dr. Christell Constant or neurosurgeon considering the cord signal changes although exam is pretty benign at this point.  Case is complicated by polyneuropathy so that does cloud the issue.  Diabetes under control at this point.  Could consider facet joint block for more axial neck pain.  He will still follow-up with Dr. August Saucer for his shoulder.    Meds & Orders:  Meds ordered this encounter  Medications   methylPREDNISolone acetate (DEPO-MEDROL) injection 40 mg    Orders Placed This Encounter  Procedures   XR C-ARM NO REPORT   Epidural Steroid injection    Follow-up: Return if symptoms worsen or fail to improve.   Procedures: No procedures performed  Cervical Epidural Steroid Injection - Interlaminar Approach with  Fluoroscopic Guidance  Patient: Ronald King      Date of Birth: [birth year] MRN: [id number] PCP: Wanda Plump, MD      Visit Date: 01/19/2023   Universal Protocol:    Date/Time: 10/30/247:38 AM  Consent Given By: the patient  Position: PRONE  Additional Comments: Vital signs were monitored before and after the procedure. Patient was prepped and draped in the usual sterile fashion. The correct patient, procedure, and site was verified.   Injection Procedure Details:   Procedure diagnoses: Cervical radiculopathy [M54.12]    Meds Administered:  Meds ordered this encounter  Medications   methylPREDNISolone acetate (DEPO-MEDROL) injection 40 mg     Laterality: Left  Location/Site: C7-T1  Needle: 3.5 in., 20 ga. Tuohy  Needle Placement: Paramedian epidural space  Findings:  -Comments: Excellent flow of contrast into the epidural space.  Procedure Details: Using a paramedian approach from the side mentioned above, the region overlying the inferior lamina was localized under fluoroscopic visualization and the soft tissues overlying this structure were infiltrated with 4 ml. of 1% Lidocaine without Epinephrine. A # 20 gauge, Tuohy needle was inserted into the epidural space using a paramedian approach.  The epidural space was localized using loss of resistance along with contralateral oblique bi-planar fluoroscopic views.  After negative aspirate for air, blood, and CSF, a 2 ml. volume of Isovue-250 was injected into the epidural space and the flow of contrast was observed. Radiographs were obtained for documentation purposes.   The injectate was administered into the level noted above.  Additional Comments:  No complications occurred Dressing: 2 x 2 sterile gauze and Band-Aid    Post-procedure details: Patient was observed during the procedure. Post-procedure instructions were reviewed.  Patient left the clinic in stable condition.   Clinical History: MRI  CERVICAL SPINE WITHOUT CONTRAST   TECHNIQUE: Multiplanar, multisequence MR imaging of the cervical spine was performed. No intravenous contrast was administered.   COMPARISON:  09/27/2003   FINDINGS: Alignment: Straightening of cervical lordosis.   Vertebrae: No fracture, evidence of discitis, or bone lesion. C2-3 marrow edema in the left facet.   Cord: T2 hyperintensity with discrete appearance in the deep left cord at C4-5.   Posterior Fossa, vertebral arteries, paraspinal tissues: Mild soft tissue inflammation around the left facet arthritis described above.   Disc levels:   C2-3: Facet and uncovertebral spurring asymmetric to the left where there is moderate foraminal stenosis.   C3-4: Uncovertebral and facet spurring with right more than left foraminal impingement. Mild disc height loss.   C4-5: Disc narrowing with endplate and uncovertebral ridging. Small central protrusion flattening the cord. Right foraminal impingement.   C5-6: Disc narrowing with ventral spondylitic spurring. Left more than right uncovertebral ridging. Left  foraminal impingement.   C6-7: Uncovertebral spurring and disc bulging causes biforaminal impingement.   C7-T1:Mild facet spurring.   IMPRESSION: 1. Generalized cervical spine degeneration with prominent active facet arthritis on the left at C2-3. 2. 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 3. Degenerative foraminal impingement on the left at C2-3, C3-4, C5-6, C6-7. Right foraminal impingement at C3-4, C4-5, and C6-7.     Electronically Signed   By: Tiburcio Pea M.D.   On: 01/21/2023 07:36   He reports that he quit smoking about 57 years ago. His smoking use included cigarettes. He started smoking about 60 years ago. He has a 3 pack-year smoking history. He has never used smokeless tobacco.  Recent Labs    07/29/22 0000  HGBA1C 6.7    Objective:  VS:  HT:    WT:   BMI:     BP:(!)  145/82  HR:94bpm  TEMP: ( )  RESP:  Physical Exam Vitals and nursing note reviewed.  Constitutional:      General: He is not in acute distress.    Appearance: Normal appearance. He is obese. He is not ill-appearing.  HENT:     Head: Normocephalic and atraumatic.     Right Ear: External ear normal.     Left Ear: External ear normal.  Eyes:     Extraocular Movements: Extraocular movements intact.  Cardiovascular:     Rate and Rhythm: Normal rate.     Pulses: Normal pulses.  Abdominal:     General: There is no distension.     Palpations: Abdomen is soft.  Musculoskeletal:        General: No signs of injury.     Cervical back: Neck supple. Tenderness present. No rigidity.     Right lower leg: No edema.     Left lower leg: No edema.     Comments: Patient has good strength in the upper extremities with 5 out of 5 strength in wrist extension long finger flexion APB.  No intrinsic hand muscle atrophy.  Negative Hoffmann's test.  Lymphadenopathy:     Cervical: No cervical adenopathy.  Skin:    Findings: No erythema or rash.  Neurological:     General: No focal deficit present.     Mental Status: He is alert and oriented to person, place, and time.     Sensory: No sensory deficit.     Motor: No weakness or abnormal muscle tone.     Coordination: Coordination normal.  Psychiatric:        Mood and Affect: Mood normal.        Behavior: Behavior normal.     Ortho Exam  Imaging: No results found.  Past Medical/Family/Surgical/Social History: Medications & Allergies reviewed per EMR, new medications updated. 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   DYSPNEA 02/04/2010    VITAMIN D DEFICIENCY 12/25/2008   Hyperlipidemia 12/25/2008   POLYPS, ESOPHAGEAL 09/16/2008   HEMORRHOIDS, INTERNAL 09/16/2008   ANEMIA, HX OF 09/16/2008   GASTROINTESTINAL HEMORRHAGE, HX OF 09/16/2008   Osteoarthritis 04/17/2008   Neuropathy-- f/u neuro, on hydrocodone (by neuro) 07/22/2007   Obesity 07/05/2007   Anxiety state 07/05/2007   Obstructive sleep  apnea 07/05/2007   Essential hypertension 07/05/2007   GERD 07/05/2007   IRRITABLE BOWEL SYNDROME 07/05/2007   HEADACHE 07/05/2007   Past Medical History:  Diagnosis Date   Allergy    Anemia    Anxiety    Blood transfusion without reported diagnosis    Chronic bronchitis    Chronic cough    Diabetes (HCC) 07-2011   per Dr Sharl Ma   DJD (degenerative joint disease)    Dyslipidemia    Esophageal polyp    GERD (gastroesophageal reflux disease)    s/p [employer] F.   Hemorrhoids, internal    History of gastrointestinal hemorrhage    Hypertension    Hypogonadism, male    per Dr Sharl Ma   Neuromuscular disorder [facility])    neuropathy feet   Neuropathy    per neuro-Dr Charlsie Quest vicodin   OSA (obstructive sleep apnea)    on CPAP   Sleep apnea    CPAP at night   Vitamin D deficiency    Family History  Problem Relation Age of Onset   Cerebral aneurysm Father    Alcohol abuse Father    Neuropathy Mother    Heart disease Mother        died at age 21   Migraines Brother        several   Diabetes Maternal Uncle    CAD Brother        CABG in his 42s   Colon cancer Neg Hx    Prostate cancer Neg Hx    Esophageal cancer Neg Hx    Stomach cancer Neg Hx    Rectal cancer Neg Hx    Past Surgical History:  Procedure Laterality Date   ANAL FISSURE REPAIR     hand fracture surgery     left   KNEE ARTHROSCOPY  2007   Dr. August Saucer   LAPAROSCOPIC NISSEN FUNDOPLICATION  05/1999   Dr. Daphine Deutscher   NOSE SURGERY     TONSILLECTOMY     UPPER GASTROINTESTINAL ENDOSCOPY     Social History   Occupational History   Occupation: Regulatory affairs officer,  retired    Comment: retired   Occupation: minister    Comment: [employer]  Tobacco Use   Smoking status: Former    Current packs/day: 0.00    Average packs/day: 1 pack/day for 3.0 years (3.0 ttl pk-yrs)    Types: Cigarettes    Start date: 03/29/1962    Quit date: 03/29/1965    Years since quitting: 57.8   Smokeless tobacco: Never  Vaping Use   Vaping status: Never Used  Substance and Sexual Activity   Alcohol use: No   Drug use: No   Sexual activity: Not on file

## 2023-02-10 DIAGNOSIS — E230 Hypopituitarism: Secondary | ICD-10-CM | POA: Diagnosis not present

## 2023-02-10 DIAGNOSIS — E119 Type 2 diabetes mellitus without complications: Secondary | ICD-10-CM | POA: Diagnosis not present

## 2023-02-10 DIAGNOSIS — R5383 Other fatigue: Secondary | ICD-10-CM | POA: Diagnosis not present

## 2023-02-10 DIAGNOSIS — Z9181 History of falling: Secondary | ICD-10-CM | POA: Diagnosis not present

## 2023-02-10 LAB — HEMOGLOBIN A1C: Hemoglobin A1C: 7

## 2023-02-18 DIAGNOSIS — J42 Unspecified chronic bronchitis: Secondary | ICD-10-CM | POA: Diagnosis not present

## 2023-03-10 DIAGNOSIS — R972 Elevated prostate specific antigen [PSA]: Secondary | ICD-10-CM | POA: Diagnosis not present

## 2023-03-10 LAB — PSA: PSA: 12.5

## 2023-03-11 ENCOUNTER — Other Ambulatory Visit: Payer: Self-pay | Admitting: Internal Medicine

## 2023-03-17 DIAGNOSIS — N410 Acute prostatitis: Secondary | ICD-10-CM | POA: Diagnosis not present

## 2023-03-17 DIAGNOSIS — N401 Enlarged prostate with lower urinary tract symptoms: Secondary | ICD-10-CM | POA: Diagnosis not present

## 2023-03-17 DIAGNOSIS — R972 Elevated prostate specific antigen [PSA]: Secondary | ICD-10-CM | POA: Diagnosis not present

## 2023-03-18 ENCOUNTER — Other Ambulatory Visit: Payer: Self-pay | Admitting: Internal Medicine

## 2023-03-20 DIAGNOSIS — J42 Unspecified chronic bronchitis: Secondary | ICD-10-CM | POA: Diagnosis not present

## 2023-03-22 ENCOUNTER — Encounter: Payer: Self-pay | Admitting: Internal Medicine

## 2023-03-28 DIAGNOSIS — E119 Type 2 diabetes mellitus without complications: Secondary | ICD-10-CM | POA: Diagnosis not present

## 2023-03-28 DIAGNOSIS — H11153 Pinguecula, bilateral: Secondary | ICD-10-CM | POA: Diagnosis not present

## 2023-03-28 DIAGNOSIS — H2513 Age-related nuclear cataract, bilateral: Secondary | ICD-10-CM | POA: Diagnosis not present

## 2023-03-28 DIAGNOSIS — H18413 Arcus senilis, bilateral: Secondary | ICD-10-CM | POA: Diagnosis not present

## 2023-03-28 DIAGNOSIS — H40033 Anatomical narrow angle, bilateral: Secondary | ICD-10-CM | POA: Diagnosis not present

## 2023-03-28 LAB — HM DIABETES EYE EXAM

## 2023-04-01 ENCOUNTER — Encounter: Payer: Self-pay | Admitting: Physician Assistant

## 2023-04-01 ENCOUNTER — Other Ambulatory Visit (INDEPENDENT_AMBULATORY_CARE_PROVIDER_SITE_OTHER): Payer: Self-pay

## 2023-04-01 ENCOUNTER — Ambulatory Visit: Payer: Medicare PPO | Admitting: Physician Assistant

## 2023-04-01 DIAGNOSIS — M79641 Pain in right hand: Secondary | ICD-10-CM | POA: Insufficient documentation

## 2023-04-01 NOTE — Progress Notes (Signed)
 Office Visit Note   Patient: Ronald King           Date of Birth: Apr 03, 1945           MRN: [id number] Visit Date: 04/01/2023              Requested by: Ronald Aloysius BRAVO, MD [address] [place],  [place] [address] PCP: Ronald Aloysius BRAVO, MD   Assessment & Plan: Visit Diagnoses:  1. Pain in right hand     Plan: Pleasant 78 year old gentleman who is now 6 weeks status post fall.  He did fall onto his right outstretched hand right-hand-dominant has been seeing Dr. Addie for neck issues as well and see me in the past.  He feels like he is still having pain over the volar surface of his thumb.  No previous history.  Has feelings of pain when he tries to abduct the thumb.  Has not had any treatment discussed trying occupational therapy versus an MRI he would like to try occupational therapy I think this is a good idea neck step would be an MRI if he does not improve TakeCare  Follow-Up Instructions: 1 month  Orders:  Orders Placed This Encounter  Procedures   XR Hand Complete Right   No orders of the defined types were placed in this encounter.     Procedures: No procedures performed   Clinical Data: No additional findings.   Subjective: Chief Complaint  Patient presents with   Right Hand - Pain    HPI Patient is a pleasant 78 year old gentleman who is 6 weeks status falling onto his right hand.  Describes outstretched hand FOOSH type injury.  Does not have any wrist pain or hand pain with the exception of pain over the volar surface of his thumb.  It does not seem to have gotten much better and he feels like he is lost some motion in his thumb. Review of Systems  All other systems reviewed and are negative.    Objective: Vital Signs: There were no vitals taken for this visit.  Physical Exam Constitutional:      Appearance: Normal appearance.  Pulmonary:     Effort: Pulmonary effort is normal.  Skin:    General: Skin is warm and dry.  Neurological:      General: No focal deficit present.     Mental Status: He is alert and oriented to person, place, and time.  Psychiatric:        Mood and Affect: Mood normal.        Behavior: Behavior normal.    Ortho Exam Right thumb neurovascular intact he is able to oppose all his fingers no pain over the dorsal surface no pain over the thumb itself.  He does have some slight swelling over the thenar eminence and has some pain with adduction otherwise neurovascular intact Specialty Comments:  MRI CERVICAL SPINE WITHOUT CONTRAST   TECHNIQUE: Multiplanar, multisequence MR imaging of the cervical spine was performed. No intravenous contrast was administered.   COMPARISON:  09/27/2003   FINDINGS: Alignment: Straightening of cervical lordosis.   Vertebrae: No fracture, evidence of discitis, or bone lesion. C2-3 marrow edema in the left facet.   Cord: T2 hyperintensity with discrete appearance in the deep left cord at C4-5.   Posterior Fossa, vertebral arteries, paraspinal tissues: Mild soft tissue inflammation around the left facet arthritis described above.   Disc levels:   C2-3: Facet and uncovertebral spurring asymmetric to the left  where there is moderate foraminal stenosis.   C3-4: Uncovertebral and facet spurring with right more than left foraminal impingement. Mild disc height loss.   C4-5: Disc narrowing with endplate and uncovertebral ridging. Small central protrusion flattening the cord. Right foraminal impingement.   C5-6: Disc narrowing with ventral spondylitic spurring. Left more than right uncovertebral ridging. Left foraminal impingement.   C6-7: Uncovertebral spurring and disc bulging causes biforaminal impingement.   C7-T1:Mild facet spurring.   IMPRESSION: 1. Generalized cervical spine degeneration with prominent active facet arthritis on the left at C2-3. 2. 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 3. Degenerative  foraminal impingement on the left at C2-3, C3-4, C5-6, C6-7. Right foraminal impingement at C3-4, C4-5, and C6-7.     Electronically Signed   By: Ronald King M.D.   On: 01/21/2023 07:36  Imaging: No results found.   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   DYSPNEA 02/04/2010   VITAMIN D  DEFICIENCY 12/25/2008   Hyperlipidemia 12/25/2008   POLYPS, ESOPHAGEAL 09/16/2008   HEMORRHOIDS, INTERNAL 09/16/2008   ANEMIA, HX OF 09/16/2008   GASTROINTESTINAL HEMORRHAGE, HX OF 09/16/2008   Osteoarthritis 04/17/2008   Neuropathy-- f/u neuro, on hydrocodone  (by neuro) 07/22/2007   Obesity 07/05/2007   Anxiety state 07/05/2007   Obstructive sleep apnea 07/05/2007   Essential hypertension 07/05/2007   GERD 07/05/2007   IRRITABLE BOWEL SYNDROME 07/05/2007   HEADACHE 07/05/2007   Past Medical History:  Diagnosis Date   Allergy     Anemia    Anxiety    Blood transfusion without reported diagnosis    Chronic bronchitis    Chronic cough    Diabetes (HCC) 07-2011   per Dr Faythe   DJD (degenerative joint disease)    Dyslipidemia    Esophageal polyp    GERD (gastroesophageal reflux disease)    s/p [employer] F.   Hemorrhoids, internal    History of gastrointestinal hemorrhage    Hypertension    Hypogonadism, male    per Dr Faythe   Neuromuscular disorder [facility])    neuropathy feet   Neuropathy    per neuro-Dr Ronald King vicodin   OSA (obstructive sleep apnea)    on CPAP   Sleep apnea    CPAP at night   Vitamin D  deficiency     Family History  Problem Relation Age of Onset   Cerebral aneurysm Father    Alcohol abuse Father     Neuropathy Mother    Heart disease Mother        died at age 66   Migraines Brother        several   Diabetes Maternal Uncle    CAD Brother        CABG in his 54s   Colon cancer Neg Hx    Prostate cancer Neg Hx    Esophageal cancer Neg Hx    Stomach cancer Neg Hx    Rectal cancer Neg Hx     Past Surgical History:  Procedure Laterality Date   ANAL FISSURE REPAIR     hand fracture surgery     left   KNEE ARTHROSCOPY  2007   Dr. Addie   LAPAROSCOPIC NISSEN FUNDOPLICATION  05/1999   Dr. 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

## 2023-04-13 NOTE — Therapy (Signed)
OUTPATIENT OCCUPATIONAL THERAPY ORTHO EVALUATION  Patient Name: Ronald King MRN: [id number] DOB:18-Jul-1945, 78 y.o., male Today's Date: 04/14/2023  PCP: Willow Ora, MD REFERRING PROVIDER:  Persons, [place], [place]    END OF SESSION:  OT End of Session - 04/14/23 1106     Visit Number 1    Number of Visits 8    Date for OT Re-Evaluation 05/27/23    Authorization Type [employer]    OT Start Time 1106    OT Stop Time 1158    OT Time Calculation (min) 52 min    Equipment Utilized During Treatment Orthotic materials    Activity Tolerance Patient tolerated treatment well;No increased pain;Patient limited by fatigue;Patient limited by pain    Behavior During Therapy [facility] for tasks assessed/performed             Past Medical History:  Diagnosis Date   Allergy    Anemia    Anxiety    Blood transfusion without reported diagnosis    Chronic bronchitis    Chronic cough    Diabetes (HCC) 07-2011   per Dr Sharl Ma   DJD (degenerative joint disease)    Dyslipidemia    Esophageal polyp    GERD (gastroesophageal reflux disease)    s/p [employer]   Hemorrhoids, internal    History of gastrointestinal hemorrhage    Hypertension    Hypogonadism, male    per Dr Sharl Ma   Neuromuscular disorder [facility])    neuropathy feet   Neuropathy    per neuro-Dr Charlsie Quest vicodin   OSA (obstructive sleep apnea)    on CPAP   Sleep apnea    CPAP at night   Vitamin D deficiency    Past Surgical History:  Procedure Laterality Date   ANAL FISSURE REPAIR     hand fracture surgery     left   KNEE ARTHROSCOPY  2007   Dr. August Saucer   LAPAROSCOPIC NISSEN FUNDOPLICATION  05/1999   Dr. Daphine Deutscher   NOSE SURGERY     TONSILLECTOMY     UPPER GASTROINTESTINAL ENDOSCOPY     Patient Active Problem List   Diagnosis Date Noted   Pain in right hand 04/01/2023   BPH (benign prostatic hyperplasia) 05/27/2016   PCP NOTES >>>>>>> 03/17/2015   Chronic cough 01/16/2015   Other malaise and fatigue 01/04/2013    Chronic sinusitis with recurrent bronchitis 06/15/2012   Annual physical exam 06/01/2012   Hyperglycemia 07/30/2011   Hypogonadism, male 11/26/2010   DYSPNEA 02/04/2010   VITAMIN D DEFICIENCY 12/25/2008   Hyperlipidemia 12/25/2008   POLYPS, ESOPHAGEAL 09/16/2008   HEMORRHOIDS, INTERNAL 09/16/2008   ANEMIA, HX OF 09/16/2008   GASTROINTESTINAL HEMORRHAGE, HX OF 09/16/2008   Osteoarthritis 04/17/2008   Neuropathy-- f/u neuro, on hydrocodone (by neuro) 07/22/2007   Obesity 07/05/2007   Anxiety state 07/05/2007   Obstructive sleep apnea 07/05/2007   Essential hypertension 07/05/2007   GERD 07/05/2007   IRRITABLE BOWEL SYNDROME 07/05/2007   HEADACHE 07/05/2007    ONSET DATE: About 7 weeks ago after a fall  REFERRING DIAG: M79.641 (ICD-10-CM) - Pain in right hand   THERAPY DIAG:  Muscle weakness (generalized)  Other lack of coordination  Pain in right hand  Acute pain of right shoulder  Rationale for Evaluation and Treatment: Rehabilitation  SUBJECTIVE:   SUBJECTIVE STATEMENT: ~7 weeks s/p fall on Rt hand.  He states 7 weeks of pain at the Rt thumb volarly.  He fell about 7 weeks ago, subsequently needing an injection in  his neck for pain, also states Rt shoulder pain and problems sleeping.  His Rt thumb has also been "locking" and painful since.  He also has a history of peripheral neuropathy at baseline   PERTINENT HISTORY: ~7 weeks s/p fall on Rt hand.  Pain over volar surface of thumb, worst pain is in right shoulder at night.   PRECAUTIONS: None ;  RED FLAGS: None   WEIGHT BEARING RESTRICTIONS:  No, but was recommended to lift no more than 10 pounds or anything that was painful to him  PAIN:  Are you having pain? Yes: NPRS scale: 7-8/10 at worst in night Pain location: Rt shoulder, also aching in thumb Pain description: Aching in the thumb when it pops and sharp and sore in the shoulder in the night Aggravating factors: Sleep positioning, repetitive use of the  thumb Relieving factors: Resting the thumb heat on the shoulder  FALLS: Has patient fallen in last 6 months? Yes. Number of falls 3- mild fall risk , these things were accident related but happen in a short span of time and he was recommended to seek physical therapy for a balance assessment as well as lower body strengthening.  They may also be able to help him with more of his cervical issues.  LIVING ENVIRONMENT: Lives with: lives with their spouse Lives in: House/apartment Has following equipment at home: None  PLOF: Independent  PATIENT GOALS: To decrease pain in the shoulder and stop the pain and clicking in his right thumb  NEXT MD VISIT: As needed   OBJECTIVE: (All objective assessments below are from initial evaluation on: 04/14/23 unless otherwise specified.)   HAND DOMINANCE: Right   ADLs: Overall ADLs: States decreased ability to grab, hold household objects, pain and difficulty to open containers, perform FMS tasks (manipulate fasteners on clothing).  FUNCTIONAL OUTCOME MEASURES: Eval: Quick DASH 26% impairment today  (Higher % Score  =  More Impairment)    UPPER EXTREMITY ROM     Shoulder to Wrist AROM Right eval  Shoulder flexion Approx 135* scaption  Shoulder abduction   Shoulder extension   Shoulder internal rotation 25*  Shoulder external rotation 75*  Elbow flexion WFL  Elbow extension WFL  Forearm supination WFL  Forearm pronation  WFL  Wrist flexion   Wrist extension   Wrist ulnar deviation   Wrist radial deviation   (Blank rows = not tested)   Hand AROM Right eval  Full Fist Ability (or Gap to Distal Palmar Crease) WFL  Thumb Opposition  (Kapandji Scale)    Thumb MCP (0-60)   Thumb IP (0-80)   Thumb Radial Abduction Span   Thumb Palmar Abduction Span   (Blank rows = not tested)   UPPER EXTREMITY MMT:    Eval:  NT at eval due to recent and still healing injuries, but grossly at least 3+/5. Will be tested when appropriate.   MMT  Right TBD  Shoulder flexion   Shoulder abduction   Shoulder adduction   Shoulder extension   Shoulder internal rotation   Shoulder external rotation   Middle trapezius   Lower trapezius   Elbow flexion   Elbow extension   Forearm supination   Forearm pronation   Wrist flexion   Wrist extension   (Blank rows = not tested)  HAND FUNCTION: Eval: Observed weakness in affected Rt hand, as seen by locking popping and pain though the details were not tested to allow rest for his hand.  Will be tested in the next  1-2 sessions as tolerated Grip strength Right: TBD lbs, Left: TBD lbs   COORDINATION: Eval: Observed coordination impairments with affected right hand.  Details will be tested as tolerated in the next 1-2 sessions 9 Hole Peg Test Right: TBD sec (TBD sec is WFL)   SENSATION: Eval:  Light touch is chronically diminished in hands and feet from peripheral neuropathy  EDEMA:   Eval: No significant swelling today in hand or thumb  COGNITION: Eval: Overall cognitive status: WFL for evaluation today   OBSERVATIONS:   Eval: He is tender around the volar MCP joint of the thumb and OT can feel a palpable clicking there with thumb motion in extension only which is highly suspicious for stenosing tenosynovitis or "trigger thumb."  He does not have pain when flexing his thumb only when extending.  CMC joint is largely nontender or symptomatic and the IP joint is non-tender (though he does have some interesting popping there as well which may need further investigation.   He does not have much difficulty reaching his shoulder up and scaption and this is not painful but he does have some pain near the [facility] joint/rotator cuff attachment, internal rotation is severely limited compared to external rotation.  Seems to have strained his rotator cuff.   He also has left-sided neck pain and stiffness and has seen a doctor for this, OT has recommended light range of motion is not painful and referral  to physical therapy if he needs anything more detailed to help him manage his neck pain.  TODAY'S TREATMENT:  Post-evaluation treatment:   For self-care/safety, OT educates on stenosing tenosynovitis at the thumb and also custom fabricates a hand-based thumb spica orthosis to help prevent motion at the MCP and CMC joints.  Unfortunately his IP joint free still does cause some triggering which is slightly abnormal, so OT applies a Band-Aid around the IP joint and with the combination of this Band-Aid and this brace, he has no triggering or pain in the thumb.  Education includes trying to avoid locking and popping at the thumb, trying to avoid heavy or repetitive gripping and squeezing, trying to avoid pain and allow rest.  He should wear his orthosis most of the day and night as tolerated and also the Band-Aid around the IP joint as tolerated to help prevent these things.  He states understanding.  Next, for his shoulder he was given 1 stretch for internal rotation and shoulder extension as listed below.  He did have some pain with this when he did it too vigorously but when he did it with less intensity, he felt a stretch but no pain.  Exercises - Standing neck/upper traps stretch  - 4-6 x daily - 3-5 reps - 15 sec hold   PATIENT EDUCATION: Education details: See tx section above for details  Person educated: Patient Education method: Verbal Instruction, Teach back, Handouts  Education comprehension: States and demonstrates understanding, Additional Education required    HOME EXERCISE PROGRAM: Access Code: [email or web address] URL: [email or web address] Date: 04/14/2023 Prepared by: Fannie Knee   GOALS: Goals reviewed with patient? Yes   SHORT TERM GOALS: (STG required if POC>30 days) Target Date: 04/29/2023  Pt will obtain protective, custom orthotic. Goal status: 04/14/2023: MET   2.  Pt will demo/state understanding of initial HEP to improve pain levels and prerequisite  motion. Goal status: INITIAL   LONG TERM GOALS: Target Date: 05/27/2023  Pt will improve functional ability by decreased impairment per Quick DASH  assessment from 26% to  15% or better, for better quality of life. Goal status: INITIAL  2.  Pt will improve grip strength in right hand from TBD baseline to at least TBD pending baseline for functional use at home and in IADLs. Goal status: INITIAL  3.  Pt will improve A/ROM in right shoulder internal rotation from 25 degrees to at least 40 degrees, to have less pain when sleeping at night.  Goal status: INITIAL  4.  Pt will improve coordination skills in right hand, as seen by within functional limit score on nine-hole peg testing, without significant pain, to have increased functional ability to carry out fine motor tasks (fasteners, etc.) and more complex, coordinated IADLs (meal prep, sports, etc.).  Goal status: INITIAL  6.  Pt will decrease pain at worst from 7-8/10 to 3/10 or better to have better sleep and occupational participation in daily roles. Goal status: INITIAL   ASSESSMENT:  CLINICAL IMPRESSION: Patient is a 78 y.o. male who was seen today for occupational therapy evaluation for pain in the right arm including right shoulder pain which is his biggest pain now, as well as right thumb pain and clicking/popping since his fall.  This presents as a strained shoulder/rotator cuff as well as likely a trigger thumb or stenosing tenosynovitis.  He will benefit from OT working on these issues with him, to decrease pain and symptoms and increase quality of life.  PERFORMANCE DEFICITS: in functional skills including ADLs, IADLs, coordination, sensation, ROM, strength, pain, fascial restrictions, muscle spasms, Fine motor control, Gross motor control, body mechanics, endurance, decreased knowledge of precautions, and UE functional use, cognitive skills including problem solving and safety awareness, and psychosocial skills including coping  strategies, environmental adaptation, and habits.   IMPAIRMENTS: are limiting patient from ADLs, IADLs, rest and sleep, leisure, and social participation.   COMORBIDITIES: may have co-morbidities  that affects occupational performance. Patient will benefit from skilled OT to address above impairments and improve overall function.  MODIFICATION OR ASSISTANCE TO COMPLETE EVALUATION: Min-Moderate modification of tasks or assist with assess necessary to complete an evaluation.  OT OCCUPATIONAL PROFILE AND HISTORY: Detailed assessment: Review of records and additional review of physical, cognitive, psychosocial history related to current functional performance.  CLINICAL DECISION MAKING: Moderate - several treatment options, min-mod task modification necessary  REHAB POTENTIAL: Excellent  EVALUATION COMPLEXITY: Moderate      PLAN:  OT FREQUENCY: 1-2x/week  OT DURATION: 6 weeks through 05/27/2023 as needed and up to 8 total visits  PLANNED INTERVENTIONS: 97168 OT Re-evaluation, 97535 self care/ADL training, 57846 therapeutic exercise, 97530 therapeutic activity, 97112 neuromuscular re-education, 97140 manual therapy, 97035 ultrasound, 97039 fluidotherapy, 97010 moist heat, 97010 cryotherapy, 97760 Orthotics management and training, 96295 Splinting (initial encounter), [id number] Subsequent splinting/medication, compression bandaging, Dry needling, coping strategies training, and patient/family education  RECOMMENDED OTHER SERVICES: None now  CONSULTED AND AGREED WITH PLAN OF CARE: Patient  PLAN FOR NEXT SESSION:   Check orthosis and recommendations to avoid triggering thumb, consider further stability testing; consider further evaluation of shoulder and possible dry needling treatment for spastic muscle groups, educate on sleeper stretch   Fannie Knee, OTR/L, CHT 04/14/2023, 12:35 PM    Referring diagnosis? M79.641 (ICD-10-CM) - Pain in right hand  Treatment diagnosis? (if different  than referring diagnosis) M79.641, M25.511 What was this (referring dx) caused by? []  Surgery [x]  Fall []  Ongoing issue []  Arthritis []  Other: ____________  Laterality: [x]  Rt []  Lt []  Both  Check all possible CPT codes:  *CHOOSE 10 OR LESS*  See Planned Interventions listed in the Plan section of the Evaluation.   65784 OT Re-evaluation, 97535 self care/ADL training, 69629 therapeutic exercise, 97530 therapeutic activity, 97112 neuromuscular re-education, 97140 manual therapy, 97035 ultrasound, 97039 fluidotherapy, 97010 moist heat, 97010 cryotherapy, 97760 Orthotics management and training, 52841 Splinting (initial encounter), [id number] Subsequent splinting/medication, compression bandaging, Dry needling, coping strategies training, and patient/family education

## 2023-04-14 ENCOUNTER — Ambulatory Visit: Payer: Medicare PPO | Admitting: Rehabilitative and Restorative Service Providers"

## 2023-04-14 ENCOUNTER — Encounter: Payer: Self-pay | Admitting: Rehabilitative and Restorative Service Providers"

## 2023-04-14 DIAGNOSIS — M79641 Pain in right hand: Secondary | ICD-10-CM

## 2023-04-14 DIAGNOSIS — M6281 Muscle weakness (generalized): Secondary | ICD-10-CM

## 2023-04-14 DIAGNOSIS — M25511 Pain in right shoulder: Secondary | ICD-10-CM

## 2023-04-14 DIAGNOSIS — R278 Other lack of coordination: Secondary | ICD-10-CM | POA: Diagnosis not present

## 2023-04-14 NOTE — Patient Instructions (Signed)

## 2023-04-19 NOTE — Therapy (Signed)
OUTPATIENT OCCUPATIONAL THERAPY TREATMENT NOTE  Patient Name: Ronald King MRN: [id number] DOB:Jan 20, 1946, 78 y.o., male Today's Date: 04/21/2023  PCP: Willow Ora, MD REFERRING PROVIDER:  Persons, [place], [place]    END OF SESSION:  OT End of Session - 04/21/23 1303     Visit Number 2    Number of Visits 8    Date for OT Re-Evaluation 05/27/23    Authorization Type [employer]    OT Start Time 1303    OT Stop Time 1341    OT Time Calculation (min) 38 min    Equipment Utilized During Treatment Orthotic materials    Activity Tolerance Patient tolerated treatment well;No increased pain;Patient limited by fatigue;Patient limited by pain    Behavior During Therapy [facility] for tasks assessed/performed              Past Medical History:  Diagnosis Date   Allergy    Anemia    Anxiety    Blood transfusion without reported diagnosis    Chronic bronchitis    Chronic cough    Diabetes (HCC) 07-2011   per Dr Sharl Ma   DJD (degenerative joint disease)    Dyslipidemia    Esophageal polyp    GERD (gastroesophageal reflux disease)    s/p [employer]   Hemorrhoids, internal    History of gastrointestinal hemorrhage    Hypertension    Hypogonadism, male    per Dr Sharl Ma   Neuromuscular disorder [facility])    neuropathy feet   Neuropathy    per neuro-Dr Charlsie Quest vicodin   OSA (obstructive sleep apnea)    on CPAP   Sleep apnea    CPAP at night   Vitamin D deficiency    Past Surgical History:  Procedure Laterality Date   ANAL FISSURE REPAIR     hand fracture surgery     left   KNEE ARTHROSCOPY  2007   Dr. August Saucer   LAPAROSCOPIC NISSEN FUNDOPLICATION  05/1999   Dr. Daphine Deutscher   NOSE SURGERY     TONSILLECTOMY     UPPER GASTROINTESTINAL ENDOSCOPY     Patient Active Problem List   Diagnosis Date Noted   Pain in right hand 04/01/2023   BPH (benign prostatic hyperplasia) 05/27/2016   PCP NOTES >>>>>>> 03/17/2015   Chronic cough 01/16/2015   Other malaise and fatigue 01/04/2013    Chronic sinusitis with recurrent bronchitis 06/15/2012   Annual physical exam 06/01/2012   Hyperglycemia 07/30/2011   Hypogonadism, male 11/26/2010   DYSPNEA 02/04/2010   VITAMIN D DEFICIENCY 12/25/2008   Hyperlipidemia 12/25/2008   POLYPS, ESOPHAGEAL 09/16/2008   HEMORRHOIDS, INTERNAL 09/16/2008   ANEMIA, HX OF 09/16/2008   GASTROINTESTINAL HEMORRHAGE, HX OF 09/16/2008   Osteoarthritis 04/17/2008   Neuropathy-- f/u neuro, on hydrocodone (by neuro) 07/22/2007   Obesity 07/05/2007   Anxiety state 07/05/2007   Obstructive sleep apnea 07/05/2007   Essential hypertension 07/05/2007   GERD 07/05/2007   IRRITABLE BOWEL SYNDROME 07/05/2007   HEADACHE 07/05/2007    ONSET DATE: About 7 weeks ago after a fall  REFERRING DIAG: M79.641 (ICD-10-CM) - Pain in right hand   THERAPY DIAG:  Muscle weakness (generalized)  Other lack of coordination  Pain in right hand  Acute pain of right shoulder  Rationale for Evaluation and Treatment: Rehabilitation  PERTINENT HISTORY: ~7 weeks s/p fall on Rt hand.  Pain over volar surface of thumb, worst pain is in right shoulder at night.  He states 7 weeks of pain at the Rt thumb  volarly.  He fell about 7 weeks ago, subsequently needing an injection in his neck for pain, also states Rt shoulder pain and problems sleeping.  His Rt thumb has also been "locking" and painful since.  He also has a history of peripheral neuropathy at baseline  PRECAUTIONS: None ;  RED FLAGS: None   WEIGHT BEARING RESTRICTIONS: No, but was recommended to lift no more than 10 pounds or anything that was painful to him    SUBJECTIVE:   SUBJECTIVE STATEMENT: 7+ weeks s/p fall on Rt hand.  He states his thumb feels much better though his orthosis was rubbing a couple places and he modified it himself.  It is now a bit short around the thumb P1.     PAIN:  Are you having pain? Yes: NPRS scale: 2/10 in thumb today Pain location: Rt shoulder, also aching in thumb Pain  description: Aching in the thumb when it pops and sharp and sore in the shoulder in the night Aggravating factors: Sleep positioning, repetitive use of the thumb Relieving factors: Resting the thumb heat on the shoulder  FALLS: Has patient fallen in last 6 months? Yes. Number of falls 3- mild fall risk , these things were accident related but happen in a short span of time and he was recommended to seek physical therapy for a balance assessment as well as lower body strengthening.  They may also be able to help him with more of his cervical issues.  PATIENT GOALS: To decrease pain in the shoulder and stop the pain and clicking in his right thumb  NEXT MD VISIT: As needed   OBJECTIVE: (All objective assessments below are from initial evaluation on: 04/14/23 unless otherwise specified.)   HAND DOMINANCE: Right   ADLs: Overall ADLs: States decreased ability to grab, hold household objects, pain and difficulty to open containers, perform FMS tasks (manipulate fasteners on clothing).  FUNCTIONAL OUTCOME MEASURES: Eval: Quick DASH 26% impairment today  (Higher % Score  =  More Impairment)    UPPER EXTREMITY ROM     Shoulder to Wrist AROM Right eval  Shoulder flexion Approx 135* scaption  Shoulder abduction   Shoulder extension   Shoulder internal rotation 25*  Shoulder external rotation 75*  Elbow flexion WFL  Elbow extension WFL  Forearm supination WFL  Forearm pronation  WFL  Wrist flexion   Wrist extension   Wrist ulnar deviation   Wrist radial deviation   (Blank rows = not tested)   Hand AROM Right eval  Full Fist Ability (or Gap to Distal Palmar Crease) WFL  Thumb Opposition  (Kapandji Scale)    Thumb MCP (0-60)   Thumb IP (0-80)   Thumb Radial Abduction Span   Thumb Palmar Abduction Span   (Blank rows = not tested)   UPPER EXTREMITY MMT:    Eval:  NT at eval due to recent and still healing injuries, but grossly at least 3+/5. Will be tested when appropriate.    MMT Right TBD  Shoulder flexion   Shoulder abduction   Shoulder adduction   Shoulder extension   Shoulder internal rotation   Shoulder external rotation   Middle trapezius   Lower trapezius   Elbow flexion   Elbow extension   Forearm supination   Forearm pronation   Wrist flexion   Wrist extension   (Blank rows = not tested)  HAND FUNCTION: 04/21/23:  Grip strength Right: 44 lbs pain, Left: 70 lbs   Eval: Observed weakness in affected Rt hand,  as seen by locking popping and pain though the details were not tested to allow rest for his hand.  Will be tested in the next 1-2 sessions as tolerated   COORDINATION: Eval: Observed coordination impairments with affected right hand.  Details will be tested as tolerated in the next 1-2 sessions 9 Hole Peg Test Right: TBD sec (TBD sec is WFL)   SENSATION: Eval:  Light touch is chronically diminished in hands and feet from peripheral neuropathy  OBSERVATIONS:   Eval: He is tender around the volar MCP joint of the thumb and OT can feel a palpable clicking there with thumb motion in extension only which is highly suspicious for stenosing tenosynovitis or "trigger thumb."  He does not have pain when flexing his thumb only when extending.  CMC joint is largely nontender or symptomatic and the IP joint is non-tender (though he does have some interesting popping there as well which may need further investigation.   He does not have much difficulty reaching his shoulder up and scaption and this is not painful but he does have some pain near the [facility] joint/rotator cuff attachment, internal rotation is severely limited compared to external rotation.  Seems to have strained his rotator cuff.   He also has left-sided neck pain and stiffness and has seen a doctor for this, OT has recommended light range of motion is not painful and referral to physical therapy if he needs anything more detailed to help him manage his neck pain.  TODAY'S TREATMENT:   04/21/23: Orthosis needed adjustment today to add some padding.  Unfortunately he trimmed it a bit short and the MP joint now moves a bit, but after padding was added it made it fit a bit tighter.  He has limited motion and no clicking.  OT also supplies a compressive strap to wear around the IP joint as he states Band-Aids keep falling off.  This seems to work better. Next we work on his shoulder a bit and he states not doing his current shoulder stretches yet, so these were reviewed and performed and also OT educates on new internal rotation stretch as well as strengthening and scapular retraction and shoulder extension.  He tolerates these well, has no pain or problems at the end of the session and states understanding to keep preventing thumb triggering.    Exercises - Standing neck/upper traps stretch  - 4 x daily - 3 reps - 15 sec hold - Sleeper Stretch  - 4 x daily - 3 reps - 15 hold - Standing Shoulder Row with Anchored Resistance  - 2 x daily - 1-2 sets - 10-15 reps    PATIENT EDUCATION: Education details: See tx section above for details  Person educated: Patient Education method: Verbal Instruction, Teach back, Handouts  Education comprehension: States and demonstrates understanding, Additional Education required    HOME EXERCISE PROGRAM: Access Code: [email or web address] URL: [email or web address] Date: 04/14/2023 Prepared by: Fannie Knee   GOALS: Goals reviewed with patient? Yes   SHORT TERM GOALS: (STG required if POC>30 days) Target Date: 04/29/2023  Pt will obtain protective, custom orthotic. Goal status: 04/14/2023: MET   2.  Pt will demo/state understanding of initial HEP to improve pain levels and prerequisite motion. Goal status: INITIAL   LONG TERM GOALS: Target Date: 05/27/2023  Pt will improve functional ability by decreased impairment per Quick DASH  assessment from 26% to 15% or better, for better quality of life. Goal status: INITIAL  2.  Pt  will improve grip  strength in right hand from TBD baseline to at least TBD pending baseline for functional use at home and in IADLs. Goal status: INITIAL  3.  Pt will improve A/ROM in right shoulder internal rotation from 25 degrees to at least 40 degrees, to have less pain when sleeping at night.  Goal status: INITIAL  4.  Pt will improve coordination skills in right hand, as seen by within functional limit score on nine-hole peg testing, without significant pain, to have increased functional ability to carry out fine motor tasks (fasteners, etc.) and more complex, coordinated IADLs (meal prep, sports, etc.).  Goal status: INITIAL  6.  Pt will decrease pain at worst from 7-8/10 to 3/10 or better to have better sleep and occupational participation in daily roles. Goal status: INITIAL   ASSESSMENT:  CLINICAL IMPRESSION: 04/21/23: Thumb seems to be doing better and pain is lower in both the shoulder and the hand now.  Continue on the it may take 4 to 6 weeks for the tenosynovitis to start to ease up  Eval: Patient is a 78 y.o. male who was seen today for occupational therapy evaluation for pain in the right arm including right shoulder pain which is his biggest pain now, as well as right thumb pain and clicking/popping since his fall.  This presents as a strained shoulder/rotator cuff as well as likely a trigger thumb or stenosing tenosynovitis.  He will benefit from OT working on these issues with him, to decrease pain and symptoms and increase quality of life.    PLAN:  OT FREQUENCY: 1-2x/week  OT DURATION: 6 weeks through 05/27/2023 as needed and up to 8 total visits  PLANNED INTERVENTIONS: 97168 OT Re-evaluation, 97535 self care/ADL training, 62952 therapeutic exercise, 97530 therapeutic activity, 97112 neuromuscular re-education, 97140 manual therapy, 97035 ultrasound, 97039 fluidotherapy, 97010 moist heat, 97010 cryotherapy, 97760 Orthotics management and training, 84132 Splinting  (initial encounter), [phone] Subsequent splinting/medication, compression bandaging, Dry needling, coping strategies training, and patient/family education  RECOMMENDED OTHER SERVICES: None now  CONSULTED AND AGREED WITH PLAN OF CARE: Patient  PLAN FOR NEXT SESSION:   Continue checking orthosis, check shoulder issues, if he feels like he can manage his symptoms on his own we can either hold off on therapy for a week or 2 or discharge to self-care completely.   Fannie Knee, OTR/L, CHT 04/21/2023, 1:45 PM

## 2023-04-20 DIAGNOSIS — J42 Unspecified chronic bronchitis: Secondary | ICD-10-CM | POA: Diagnosis not present

## 2023-04-20 IMAGING — MR MR HEAD W/O CM
10 series · 48 of 48 positions shown · non-contrast
Comparison: No pertinent prior exams available for comparison.

CLINICAL DATA: Acute non intractable headache, unspecified headache
type. Dizziness. Dizziness, nonspecific; headache, new or worsening.
Additional history provided by scanning technologist: Patient
reports headaches with dizziness and sensitivity to noise.

EXAM:
MRI HEAD WITHOUT CONTRAST
TECHNIQUE: Multiplanar, multiecho pulse sequences of the brain and surrounding
structures were obtained without intravenous contrast.

[Series 2: DWI · axial · 3.0mm · 1.30mm/px · z∈[-62,+104]mm · 9 of 113 slices shown (1 of 4)]
[im 1/113]
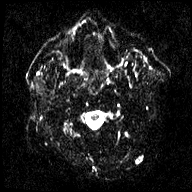
[im 15/113]
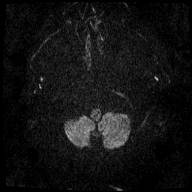
[im 29/113]
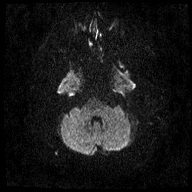
[im 43/113]
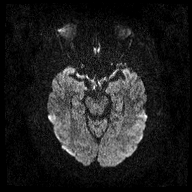
[im 57/113]
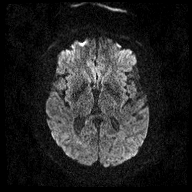
[im 71/113]
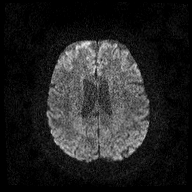
[im 85/113]
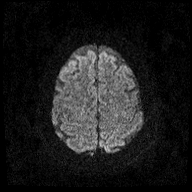
[im 99/113]
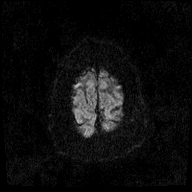
[im 113/113]
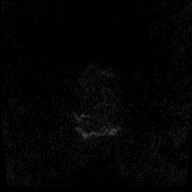

[Series 3: DWI · axial · 3.0mm · 1.30mm/px · z∈[-62,+104]mm · 4 of 57 slices shown (2 of 4)]
[im 1/57]
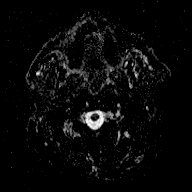
[im 19/57]
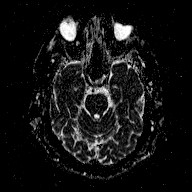
[im 38/57]
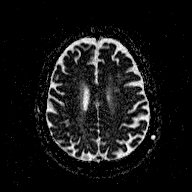
[im 57/57]
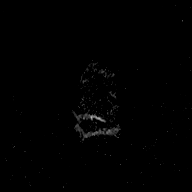

[Series 4: DWI · coronal · 5.0mm · 1.30mm/px · 5 of 70 slices shown (3 of 4)]
[im 1/70]
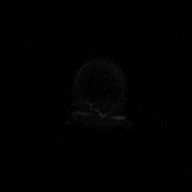
[im 18/70]
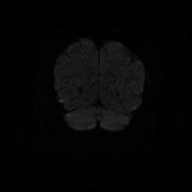
[im 35/70]
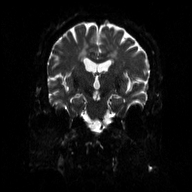
[im 52/70]
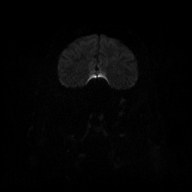
[im 70/70]
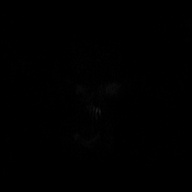

[Series 5: DWI · coronal · 5.0mm · 1.30mm/px · 3 of 36 slices shown (4 of 4)]
[im 1/36]
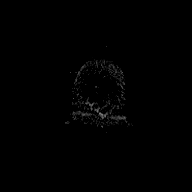
[im 18/36]
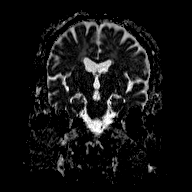
[im 36/36]
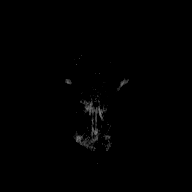

[Series 6: T1 · sagittal · 5.0mm · 0.51mm/px · 2 of 25 slices shown (1 of 2)]
[im 1/25]
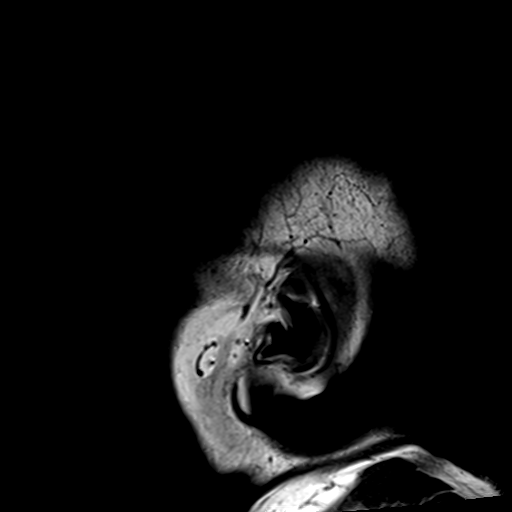
[im 25/25]
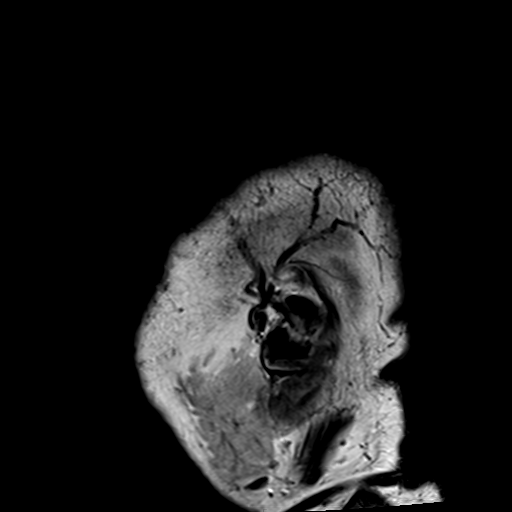

[Series 7: T2 · axial · 5.0mm · 0.72mm/px · z∈[-65,+101]mm · 2 of 25 slices shown (1 of 3)]
[im 1/25]
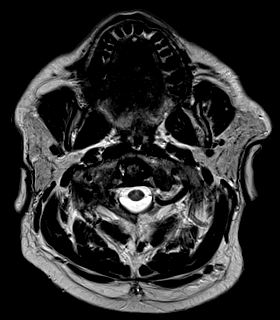
[im 25/25]
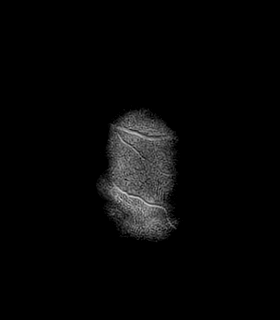

[Series 8: FLAIR · axial · 3.0mm · 0.45mm/px · z∈[-62,+98]mm · 4 of 55 slices shown]
[im 1/55]
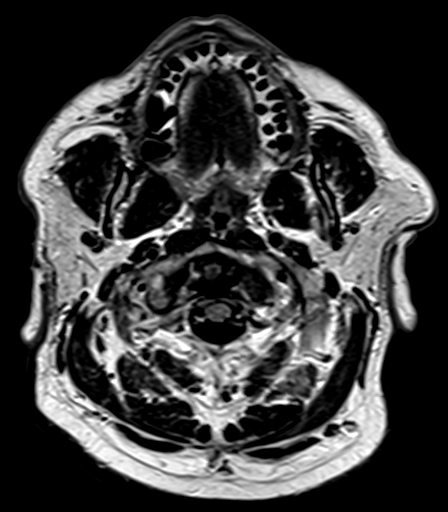
[im 19/55]
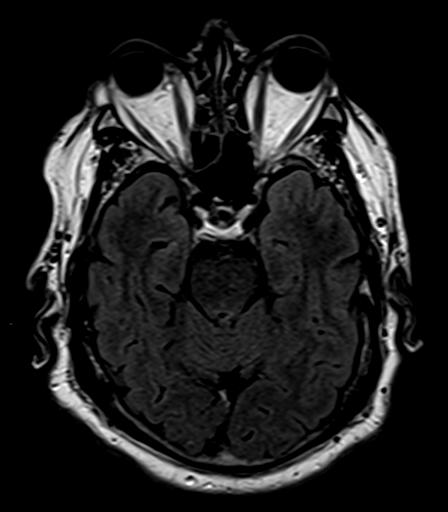
[im 37/55]
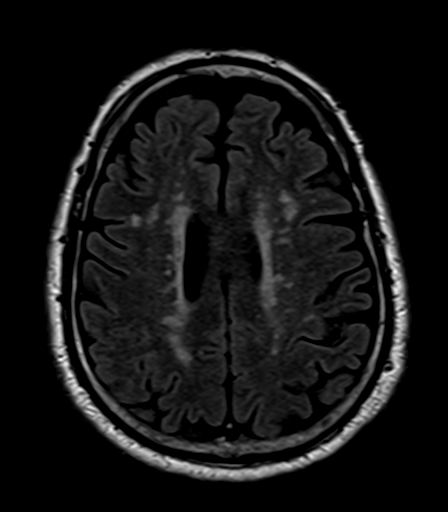
[im 55/55]
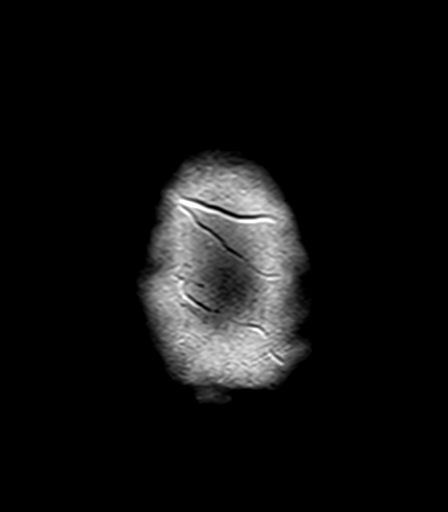

[Series 9: T2 · axial · 5.0mm · 0.72mm/px · z∈[-65,+101]mm · 2 of 25 slices shown (2 of 3)]
[im 1/25]
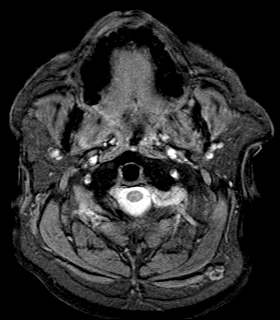
[im 25/25]
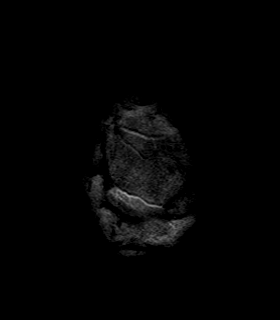

[Series 10: T1 · axial · 1.0mm · 0.94mm/px · z∈[-68,+105]mm · 14 of 176 slices shown (2 of 2)]
[im 1/176]
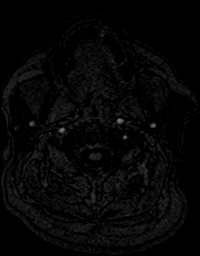
[im 14/176]
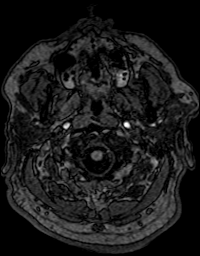
[im 27/176]
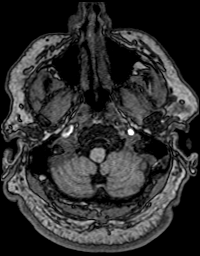
[im 41/176]
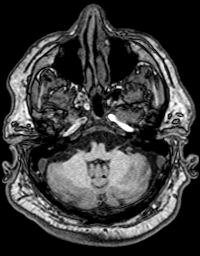
[im 54/176]
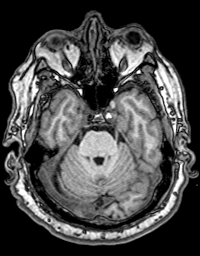
[im 68/176]
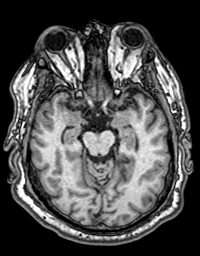
[im 81/176]
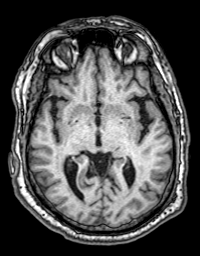
[im 95/176]
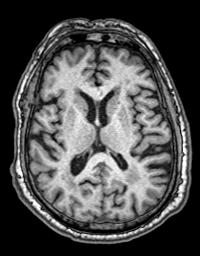
[im 108/176]
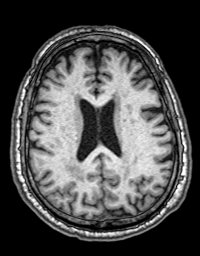
[im 122/176]
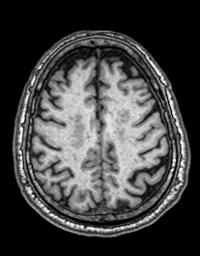
[im 135/176]
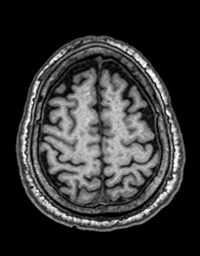
[im 149/176]
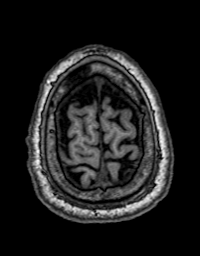
[im 162/176]
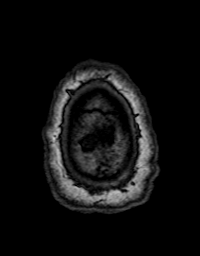
[im 176/176]
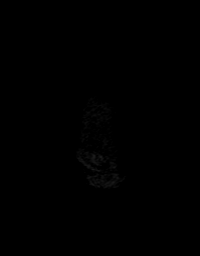

[Series 11: T2 · coronal · 5.0mm · 0.43mm/px · 3 of 33 slices shown (3 of 3)]
[im 1/33]
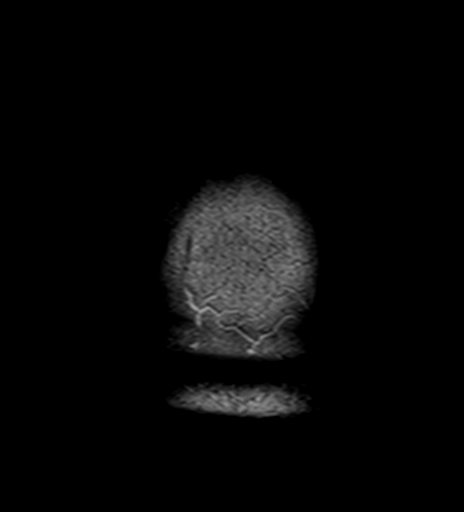
[im 17/33]
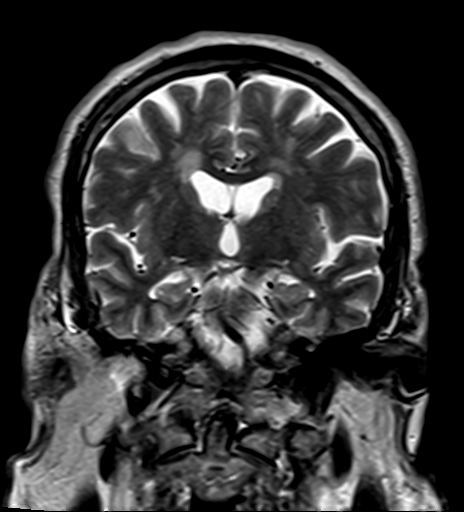
[im 33/33]
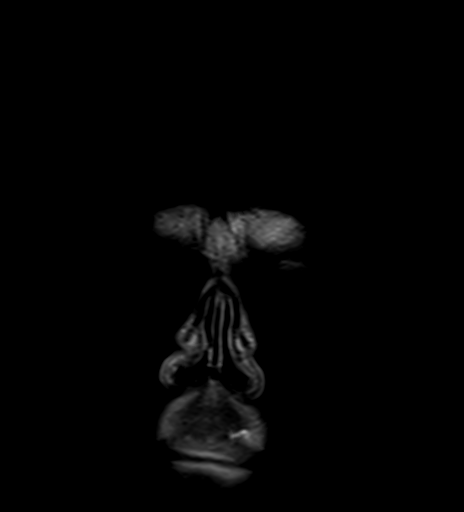

[48 of 48 positions shown; findings below may reference images not displayed]

FINDINGS: Mild intermittent motion degradation.

Brain:

No age advanced or lobar predominant parenchymal atrophy.

Moderate multifocal T2 FLAIR hyperintense signal abnormality within
the cerebral white matter, nonspecific but compatible with chronic
small vessel ischemic disease.

There are a few scattered nonspecific punctate supratentorial
chronic parenchymal microhemorrhages.

There is no acute infarct.

No evidence of an intracranial mass.

No extra-axial fluid collection.

No midline shift.

Vascular: Maintained flow voids within the proximal large arterial
vessels.

Skull and upper cervical spine: No focal suspicious marrow lesion.

Sinuses/Orbits: Bilateral proptosis. Mild mucosal thickening within
the bilateral ethmoid sinuses.
IMPRESSION: No evidence of acute intracranial abnormality.

Moderate chronic small vessel ischemic changes within the cerebral
white matter.

Mild mucosal thickening within the bilateral ethmoid sinuses.

Bilateral proptosis.

## 2023-04-21 ENCOUNTER — Encounter: Payer: Self-pay | Admitting: Rehabilitative and Restorative Service Providers"

## 2023-04-21 ENCOUNTER — Ambulatory Visit: Payer: Medicare PPO | Admitting: Rehabilitative and Restorative Service Providers"

## 2023-04-21 DIAGNOSIS — M6281 Muscle weakness (generalized): Secondary | ICD-10-CM | POA: Diagnosis not present

## 2023-04-21 DIAGNOSIS — M79641 Pain in right hand: Secondary | ICD-10-CM | POA: Diagnosis not present

## 2023-04-21 DIAGNOSIS — M25511 Pain in right shoulder: Secondary | ICD-10-CM

## 2023-04-21 DIAGNOSIS — R278 Other lack of coordination: Secondary | ICD-10-CM

## 2023-04-23 ENCOUNTER — Other Ambulatory Visit: Payer: Self-pay | Admitting: Internal Medicine

## 2023-04-25 NOTE — Therapy (Signed)
OUTPATIENT OCCUPATIONAL THERAPY TREATMENT NOTE  Patient Name: Ronald King MRN: [id number] DOB:1945/04/09, 78 y.o., male Today's Date: 04/28/2023  PCP: Willow Ora, MD REFERRING PROVIDER:  Persons, [place], [place]    END OF SESSION:  OT End of Session - 04/28/23 1302     Visit Number 3    Number of Visits 8    Date for OT Re-Evaluation 05/27/23    Authorization Type [employer]    OT Start Time 1302    OT Stop Time 1344    OT Time Calculation (min) 42 min    Equipment Utilized During Treatment Orthotic materials    Activity Tolerance Patient tolerated treatment well;No increased pain;Patient limited by fatigue;Patient limited by pain    Behavior During Therapy [facility] for tasks assessed/performed               Past Medical History:  Diagnosis Date   Allergy    Anemia    Anxiety    Blood transfusion without reported diagnosis    Chronic bronchitis    Chronic cough    Diabetes (HCC) 07-2011   per Dr Sharl Ma   DJD (degenerative joint disease)    Dyslipidemia    Esophageal polyp    GERD (gastroesophageal reflux disease)    s/p [employer] F.   Hemorrhoids, internal    History of gastrointestinal hemorrhage    Hypertension    Hypogonadism, male    per Dr Sharl Ma   Neuromuscular disorder [facility])    neuropathy feet   Neuropathy    per neuro-Dr Charlsie Quest vicodin   OSA (obstructive sleep apnea)    on CPAP   Sleep apnea    CPAP at night   Vitamin D deficiency    Past Surgical History:  Procedure Laterality Date   ANAL FISSURE REPAIR     hand fracture surgery     left   KNEE ARTHROSCOPY  2007   Dr. August Saucer   LAPAROSCOPIC NISSEN FUNDOPLICATION  05/1999   Dr. Daphine Deutscher   NOSE SURGERY     TONSILLECTOMY     UPPER GASTROINTESTINAL ENDOSCOPY     Patient Active Problem List   Diagnosis Date Noted   Pain in right hand 04/01/2023   BPH (benign prostatic hyperplasia) 05/27/2016   PCP NOTES >>>>>>> 03/17/2015   Chronic cough 01/16/2015   Other malaise and fatigue 01/04/2013    Chronic sinusitis with recurrent bronchitis 06/15/2012   Annual physical exam 06/01/2012   Hyperglycemia 07/30/2011   Hypogonadism, male 11/26/2010   DYSPNEA 02/04/2010   VITAMIN D DEFICIENCY 12/25/2008   Hyperlipidemia 12/25/2008   POLYPS, ESOPHAGEAL 09/16/2008   HEMORRHOIDS, INTERNAL 09/16/2008   ANEMIA, HX OF 09/16/2008   GASTROINTESTINAL HEMORRHAGE, HX OF 09/16/2008   Osteoarthritis 04/17/2008   Neuropathy-- f/u neuro, on hydrocodone (by neuro) 07/22/2007   Obesity 07/05/2007   Anxiety state 07/05/2007   Obstructive sleep apnea 07/05/2007   Essential hypertension 07/05/2007   GERD 07/05/2007   IRRITABLE BOWEL SYNDROME 07/05/2007   HEADACHE 07/05/2007    ONSET DATE: About 7 weeks ago after a fall  REFERRING DIAG: M79.641 (ICD-10-CM) - Pain in right hand   THERAPY DIAG:  Muscle weakness (generalized)  Pain in right hand  Other lack of coordination  Acute pain of right shoulder  Rationale for Evaluation and Treatment: Rehabilitation  PERTINENT HISTORY:  Pain over volar surface of thumb, worst pain is in right shoulder at night.  He states 7 weeks of pain at the Rt thumb volarly.  He fell about 7  weeks ago, subsequently needing an injection in his neck for pain, also states Rt shoulder pain and problems sleeping.  His Rt thumb has also been "locking" and painful since.  He also has a history of peripheral neuropathy at baseline  PRECAUTIONS: None ;  RED FLAGS: None   WEIGHT BEARING RESTRICTIONS: No, but was recommended to lift no more than 10 pounds or anything that was painful to him    SUBJECTIVE:   SUBJECTIVE STATEMENT: 8 weeks s/p fall on Rt hand.  He states that unfortunately his thumb has been clicking and locking again despite wearing his brace.  He did lose the compressive IP joint blocking component and has tried to use a Band-Aid instead.  He also admits to using his hand forcefully for many tasks around the house even when it was hurting.    PAIN:   Are you having pain? Yes: NPRS scale: 4/10 in thumb today Pain location: Rt shoulder, also aching in thumb Pain description: Aching in the thumb when it pops and sharp and sore in the shoulder in the night Aggravating factors: Sleep positioning, repetitive use of the thumb Relieving factors: Resting the thumb heat on the shoulder  FALLS: Has patient fallen in last 6 months? Yes. Number of falls 3- mild fall risk , these things were accident related but happen in a short span of time and he was recommended to seek physical therapy for a balance assessment as well as lower body strengthening.  They may also be able to help him with more of his cervical issues.  PATIENT GOALS: To decrease pain in the shoulder and stop the pain and clicking in his right thumb  NEXT MD VISIT: As needed   OBJECTIVE: (All objective assessments below are from initial evaluation on: 04/14/23 unless otherwise specified.)   HAND DOMINANCE: Right   ADLs: Overall ADLs: States decreased ability to grab, hold household objects, pain and difficulty to open containers, perform FMS tasks (manipulate fasteners on clothing).  FUNCTIONAL OUTCOME MEASURES: Eval: Quick DASH 26% impairment today  (Higher % Score  =  More Impairment)    UPPER EXTREMITY ROM     Shoulder to Wrist AROM Right eval  Shoulder flexion Approx 135* scaption  Shoulder abduction   Shoulder extension   Shoulder internal rotation 25*  Shoulder external rotation 75*  Elbow flexion WFL  Elbow extension WFL  Forearm supination WFL  Forearm pronation  WFL  Wrist flexion   Wrist extension   Wrist ulnar deviation   Wrist radial deviation   (Blank rows = not tested)   Hand AROM Right eval  Full Fist Ability (or Gap to Distal Palmar Crease) WFL  Thumb Opposition  (Kapandji Scale)    Thumb MCP (0-60)   Thumb IP (0-80)   Thumb Radial Abduction Span   Thumb Palmar Abduction Span   (Blank rows = not tested)   UPPER EXTREMITY MMT:    Eval:  NT  at eval due to recent and still healing injuries, but grossly at least 3+/5. Will be tested when appropriate.   MMT Right TBD  Shoulder flexion   Shoulder abduction   Shoulder adduction   Shoulder extension   Shoulder internal rotation   Shoulder external rotation   Middle trapezius   Lower trapezius   Elbow flexion   Elbow extension   Forearm supination   Forearm pronation   Wrist flexion   Wrist extension   (Blank rows = not tested)  HAND FUNCTION: 04/21/23:  Grip strength  Right: 44 lbs pain, Left: 70 lbs   Eval: Observed weakness in affected Rt hand, as seen by locking popping and pain though the details were not tested to allow rest for his hand.  Will be tested in the next 1-2 sessions as tolerated   COORDINATION: Eval: Observed coordination impairments with affected right hand.  Details will be tested as tolerated in the next 1-2 sessions 9 Hole Peg Test Right: TBD sec (TBD sec is WFL)   SENSATION: Eval:  Light touch is chronically diminished in hands and feet from peripheral neuropathy  OBSERVATIONS:   Eval: He is tender around the volar MCP joint of the thumb and OT can feel a palpable clicking there with thumb motion in extension only which is highly suspicious for stenosing tenosynovitis or "trigger thumb."  He does not have pain when flexing his thumb only when extending.  CMC joint is largely nontender or symptomatic and the IP joint is non-tender (though he does have some interesting popping there as well which may need further investigation.   He does not have much difficulty reaching his shoulder up and scaption and this is not painful but he does have some pain near the [facility] joint/rotator cuff attachment, internal rotation is severely limited compared to external rotation.  Seems to have strained his rotator cuff.   He also has left-sided neck pain and stiffness and has seen a doctor for this, OT has recommended light range of motion is not painful and referral to  physical therapy if he needs anything more detailed to help him manage his neck pain.  TODAY'S TREATMENT:  04/28/23: Unfortunately, he has been overusing his hand, and after he modified his orthosis and made it smaller he is able to trigger again and this has been going on over the past week.  He is now having more pain again.   We decided to make a new custom orthosis that immobilizes the entire thumb and it was explained to him that he should wear this for at least 4 weeks removing only for hygiene and small periods of rest.  He should do everything within his power to avoid heavy gripping and use of the hand or any triggering or pain during those 4 weeks.  He can return at any time if he needs for orthotic adjustments or to discuss any new pain or issues that he is having.  This orthosis was a hand-based thumb spica that immobilizes the entire thumb that was padded and made carefully so that it would not cause a problem.  He was told to not modify this orthosis.  Fortunately his right shoulder has been feeling better.  Unfortunately he states not doing any exercises for because is feeling better and OT suggest that it is good to do now and then for maintenance.   He states understanding all directions, leaves without any pain or questions.   Exercises reviewed today - Standing neck/upper traps stretch  - 4 x daily - 3 reps - 15 sec hold - Sleeper Stretch  - 4 x daily - 3 reps - 15 hold - Standing Shoulder Row with Anchored Resistance  - 2 x daily - 1-2 sets - 10-15 reps   PATIENT EDUCATION: Education details: See tx section above for details  Person educated: Patient Education method: Verbal Instruction, Teach back, Handouts  Education comprehension: States and demonstrates understanding, Additional Education required    HOME EXERCISE PROGRAM: Access Code: [email or web address] URL: [email or web address] Date: 04/14/2023 Prepared by: Fannie Knee  GOALS: Goals reviewed with  patient? Yes   SHORT TERM GOALS: (STG required if POC>30 days) Target Date: 04/29/2023  Pt will obtain protective, custom orthotic. Goal status: 04/14/2023: MET   2.  Pt will demo/state understanding of initial HEP to improve pain levels and prerequisite motion. Goal status: INITIAL   LONG TERM GOALS: Target Date: 05/27/2023  Pt will improve functional ability by decreased impairment per Quick DASH  assessment from 26% to 15% or better, for better quality of life. Goal status: INITIAL  2.  Pt will improve grip strength in right hand from TBD baseline to at least TBD pending baseline for functional use at home and in IADLs. Goal status: INITIAL  3.  Pt will improve A/ROM in right shoulder internal rotation from 25 degrees to at least 40 degrees, to have less pain when sleeping at night.  Goal status: INITIAL  4.  Pt will improve coordination skills in right hand, as seen by within functional limit score on nine-hole peg testing, without significant pain, to have increased functional ability to carry out fine motor tasks (fasteners, etc.) and more complex, coordinated IADLs (meal prep, sports, etc.).  Goal status: INITIAL  6.  Pt will decrease pain at worst from 7-8/10 to 3/10 or better to have better sleep and occupational participation in daily roles. Goal status: INITIAL   ASSESSMENT:  CLINICAL IMPRESSION: 04/28/23: Unfortunately he had an exacerbation of pain and locking in his hand and thumb from overuse of his hand and modifying his past orthosis making it not effective.  OT had to make a new orthosis today that was more aggressive and immobilizes the entire thumb.  OT also emphasizes to come back if needed with any problems but that he should wear this for 4 straight weeks allowing his thumb to rest.  If after 4 weeks of immobilization he still has pain or locking, he will be referred to the hand surgeon.    PLAN:  OT FREQUENCY: 1-2x/week  OT DURATION: 6 weeks through  05/27/2023 as needed and up to 8 total visits  PLANNED INTERVENTIONS: 97168 OT Re-evaluation, 97535 self care/ADL training, 04540 therapeutic exercise, 97530 therapeutic activity, 97112 neuromuscular re-education, 97140 manual therapy, 97035 ultrasound, 97039 fluidotherapy, 97010 moist heat, 97010 cryotherapy, 97760 Orthotics management and training, 98119 Splinting (initial encounter), [id number] Subsequent splinting/medication, compression bandaging, Dry needling, coping strategies training, and patient/family education  CONSULTED AND AGREED WITH PLAN OF CARE: Patient  PLAN FOR NEXT SESSION:   See back in 4 weeks or sooner if needed with any new problems to check on thumb tenosynovitis and perhaps start range of motion if locking is completely resolved.   Fannie Knee, OTR/L, CHT 04/28/2023, 5:04 PM

## 2023-04-27 ENCOUNTER — Other Ambulatory Visit: Payer: Self-pay | Admitting: Internal Medicine

## 2023-04-28 ENCOUNTER — Ambulatory Visit: Payer: Medicare PPO | Admitting: Rehabilitative and Restorative Service Providers"

## 2023-04-28 ENCOUNTER — Encounter: Payer: Self-pay | Admitting: Rehabilitative and Restorative Service Providers"

## 2023-04-28 DIAGNOSIS — M25511 Pain in right shoulder: Secondary | ICD-10-CM

## 2023-04-28 DIAGNOSIS — R278 Other lack of coordination: Secondary | ICD-10-CM | POA: Diagnosis not present

## 2023-04-28 DIAGNOSIS — R972 Elevated prostate specific antigen [PSA]: Secondary | ICD-10-CM | POA: Diagnosis not present

## 2023-04-28 DIAGNOSIS — M6281 Muscle weakness (generalized): Secondary | ICD-10-CM

## 2023-04-28 DIAGNOSIS — R351 Nocturia: Secondary | ICD-10-CM | POA: Diagnosis not present

## 2023-04-28 DIAGNOSIS — M79641 Pain in right hand: Secondary | ICD-10-CM

## 2023-04-28 DIAGNOSIS — N401 Enlarged prostate with lower urinary tract symptoms: Secondary | ICD-10-CM | POA: Diagnosis not present

## 2023-04-28 NOTE — Telephone Encounter (Signed)
Pdmp okay, Rx sent

## 2023-04-28 NOTE — Telephone Encounter (Signed)
Requesting: clonazepam 1mg   Contract: 01/03/23 UDS: 01/03/23 Last Visit: 01/03/23 Next Visit: 05/10/23 Last Refill: 01/03/23 #60 and 1RF   Please Advise

## 2023-05-03 ENCOUNTER — Telehealth: Payer: Self-pay | Admitting: Gastroenterology

## 2023-05-03 NOTE — Telephone Encounter (Signed)
Patient called to request a refill on Prevacid.

## 2023-05-04 MED ORDER — LANSOPRAZOLE 30 MG PO CPDR: 30.0000 mg | DELAYED_RELEASE_CAPSULE | Freq: Two times a day (BID) | ORAL | 0 refills | Status: DC

## 2023-05-04 NOTE — Telephone Encounter (Signed)
Patient has scheduled an appointment to follow up. Sent refills of Prevacid to his pharmacy and to keep his follow up appointment

## 2023-05-05 ENCOUNTER — Encounter: Payer: Medicare PPO | Admitting: Rehabilitative and Restorative Service Providers"

## 2023-05-09 ENCOUNTER — Other Ambulatory Visit: Payer: Self-pay | Admitting: Urology

## 2023-05-09 DIAGNOSIS — R972 Elevated prostate specific antigen [PSA]: Secondary | ICD-10-CM

## 2023-05-10 ENCOUNTER — Ambulatory Visit: Payer: Medicare PPO | Admitting: Internal Medicine

## 2023-05-12 DIAGNOSIS — R972 Elevated prostate specific antigen [PSA]: Secondary | ICD-10-CM | POA: Diagnosis not present

## 2023-05-12 LAB — PSA: PSA: 23.9

## 2023-05-17 ENCOUNTER — Encounter: Payer: Self-pay | Admitting: Urology

## 2023-05-20 ENCOUNTER — Encounter: Payer: Self-pay | Admitting: Internal Medicine

## 2023-05-21 DIAGNOSIS — J42 Unspecified chronic bronchitis: Secondary | ICD-10-CM | POA: Diagnosis not present

## 2023-05-24 ENCOUNTER — Encounter: Payer: Self-pay | Admitting: Internal Medicine

## 2023-05-24 ENCOUNTER — Ambulatory Visit: Payer: Medicare PPO | Admitting: Internal Medicine

## 2023-05-24 VITALS — BP 120/80 | HR 76 | Temp 98.3°F | Ht 73.0 in | Wt 259.8 lb

## 2023-05-24 DIAGNOSIS — E785 Hyperlipidemia, unspecified: Secondary | ICD-10-CM

## 2023-05-24 NOTE — Progress Notes (Unsigned)
 Subjective:    Patient ID: Ronald King, male    DOB: [birth year], 78 y.o.   MRN: [id number]  DOS:  05/24/2023 Type of visit - description: Routine visit  Today we talk about his chronic medical problems. Unfortunately his PSA is going up, to have an MRI soon, patient is appropriately concerned about it.   Review of Systems See above   Past Medical History:  Diagnosis Date   Allergy    Anemia    Anxiety    Blood transfusion without reported diagnosis    Chronic bronchitis    Chronic cough    Diabetes (HCC) 07-2011   per Dr Sharl Ma   DJD (degenerative joint disease)    Dyslipidemia    Esophageal polyp    GERD (gastroesophageal reflux disease)    s/p [employer] F.   Hemorrhoids, internal    History of gastrointestinal hemorrhage    Hypertension    Hypogonadism, male    per Dr Sharl Ma   Neuromuscular disorder [facility])    neuropathy feet   Neuropathy    per neuro-Dr Charlsie Quest vicodin   OSA (obstructive sleep apnea)    on CPAP   Sleep apnea    CPAP at night   Vitamin D deficiency     Past Surgical History:  Procedure Laterality Date   ANAL FISSURE REPAIR     hand fracture surgery     left   KNEE ARTHROSCOPY  2007   Dr. August Saucer   LAPAROSCOPIC NISSEN FUNDOPLICATION  05/1999   Dr. Daphine Deutscher   NOSE SURGERY     TONSILLECTOMY     UPPER GASTROINTESTINAL ENDOSCOPY      Current Outpatient Medications  Medication Instructions   amLODipine (NORVASC) 10 mg, Oral, Daily   amoxicillin (AMOXIL) 500 mg, Oral, 3 times daily with meals, As needed for bronchitis flares   ascorbic acid (VITAMIN C) 1,000 mg, Daily   aspirin EC 81 mg, Oral, Daily, Swallow whole.   Azelastine-Fluticasone 137-50 MCG/ACT SUSP 1 spray, Nasal, 2 times daily   budesonide (PULMICORT) 0.5 mg, Nebulization, 2 times daily PRN   Calcium Carbonate-Vitamin D 600-400 MG-UNIT tablet 1 tablet, Daily   clonazePAM (KLONOPIN) 1 MG tablet TAKE 1 TABLET(1 MG) BY MOUTH TWICE DAILY   Coenzyme Q10 (CO Q 10 PO) 1 tablet, Daily    Cyanocobalamin (B-12) 2000 MCG TABS 1 tablet, Daily   ezetimibe (ZETIA) 10 mg, Oral, Daily   fenofibrate (TRICOR) 145 mg, Oral, Daily   HYDROcodone bit-homatropine (HYCODAN) 5-1.5 MG/5ML syrup 5 mLs, Oral, Every 6 hours PRN   lansoprazole (PREVACID) 30 mg, Oral, 2 times daily before meals, Keep scheduled appointment   methocarbamol (ROBAXIN) 500 mg, Oral, Every 6 hours PRN   Multiple Vitamins-Minerals (CENTRUM SILVER PO) 1 tablet, Daily   PARoxetine (PAXIL) 20 mg, Oral, Daily   predniSONE (STERAPRED UNI-PAK 21 TAB) 5 MG (21) TBPK tablet Take dosepak as directed   pyridOXINE (VITAMIN B6) 100 mg, Daily   rosuvastatin (CRESTOR) 40 mg, Oral, Daily at bedtime   sucralfate (CARAFATE) 1 g tablet TAKE 1 TABLET(1 GRAM) BY MOUTH TWICE DAILY   Trulicity 0.75 mg, Weekly       Objective:   Physical Exam BP 120/80   Pulse 76   Temp 98.3 F (36.8 C)   Ht 6\' 1"  (1.854 m)   Wt 259 lb 12.8 oz (117.8 kg)   SpO2 95%   BMI 34.28 kg/m  General:   Well developed, NAD, BMI noted. HEENT:  Normocephalic . Face symmetric, atraumatic  Lungs:  CTA B Normal respiratory effort, no intercostal retractions, no accessory muscle use. Heart: RRR,  no murmur.  Lower extremities: no pretibial edema bilaterally  Skin: Not pale. Not jaundice Neurologic:  alert & oriented X3.  Speech normal, gait appropriate for age and unassisted Psych--  Cognition and judgment appear intact.  Cooperative with normal attention span and concentration.  Behavior appropriate. No anxious or depressed appearing.      Assessment   Assessment  ENDO: Dr Sharl Ma -DM - hypogonadism  HTN Dislipidemia NEURO: see note 08/08/2017 --+ Neuropathy  --RLS type of symptoms -- Headaches, seen neuro before, multiple meds including botox Obesity, Anxiety -- paxil- clonazepam, Dr Lelon Perla retired, PCP started to prescribe 12-2022 Vitamin D deficiency PULM:  --OSA, CPAP --Chronic cough  DJD, Dr. August Saucer GU: Dr Mariana Arn BPH, ED  , prostatitis   GI: --GERD, status post Nissen fundoplication --Esophageal polyp -- h/o GI bleed- tranfussion (90s)  PLAN: DM: Per Endo, A1c 7.0 (November 2024). Dyslipidemia: Based on last FLP, Zetia was added to Crestor and Tricor.  Reports good compliance, check FLP. HTN: BP is very good, on amlodipine, Elevated PSA: Saw urology 04/28/2023.  PSA 23 (May 11, 2022, [facility]).  To have a MRI. Preventive care: Had a flu shot and RSV at the pharmacy, encourage a COVID booster and PNM 20. RTC 4 months CPX

## 2023-05-24 NOTE — Patient Instructions (Addendum)
 Please consider a COVID booster.  Talk with your pharmacy, let me know if they give you a pneumonia shot called "PNM 20"  GO TO THE LAB : Get the blood work     Next visit with me in 4 months, physical exam Please schedule it at the front desk

## 2023-05-25 LAB — LIPID PANEL
Cholesterol: 136 mg/dL (ref 0–200)
HDL: 39.7 mg/dL (ref >39.00)
LDL Cholesterol: 71 mg/dL (ref 0–99)
NonHDL: 96.03
Total CHOL/HDL Ratio: 3
Triglycerides: 123 mg/dL (ref 0.0–149.0)
VLDL: 24.6 mg/dL (ref 0.0–40.0)

## 2023-05-25 NOTE — Assessment & Plan Note (Signed)
 DM: Per Endo, A1c 7.0 (November 2024). Dyslipidemia: Based on last FLP, Zetia was added to Crestor and Tricor.  Reports good compliance, check FLP. HTN: BP is very good, on amlodipine, Elevated PSA: Saw urology 04/28/2023.  PSA 23 (May 11, 2022, [facility]).  To have a MRI. Preventive care: Had a flu shot and RSV at the pharmacy, encourage a COVID booster and PNM 20. RTC 4 months CPX

## 2023-05-26 ENCOUNTER — Encounter: Payer: Medicare PPO | Admitting: Rehabilitative and Restorative Service Providers"

## 2023-05-26 ENCOUNTER — Encounter: Payer: Self-pay | Admitting: Internal Medicine

## 2023-05-26 ENCOUNTER — Ambulatory Visit
Admission: RE | Admit: 2023-05-26 | Discharge: 2023-05-26 | Disposition: A | Discharge status: Home / Self Care | Payer: Medicare PPO | Source: Ambulatory Visit | Attending: Urology | Admitting: Urology

## 2023-05-26 DIAGNOSIS — R972 Elevated prostate specific antigen [PSA]: Secondary | ICD-10-CM | POA: Diagnosis not present

## 2023-05-26 MED ORDER — GADOPICLENOL 0.5 MMOL/ML IV SOLN
10.0000 mL | Freq: Once | INTRAVENOUS | Status: AC | PRN
  Administered 2023-05-26: 10 mL via INTRAVENOUS

## 2023-06-01 NOTE — Therapy (Signed)
 OUTPATIENT OCCUPATIONAL THERAPY TREATMENT & DISCHARGE NOTE  Patient Name: Ronald King MRN: [id number] DOB:[birth year], 78 y.o., male Today's Date: 06/02/2023  PCP: Willow Ora, MD REFERRING PROVIDER:  Persons, [place], [place]           OCCUPATIONAL THERAPY DISCHARGE SUMMARY  Visits from Start of Care: 4  Current functional level related to goals / functional outcomes: 06/02/23: He was faithful with wearing a thumb spica orthosis for about 4 weeks to help rest his triggering right thumb.  Now he does have less pain, less triggering, and he states able to move his thumb better now-also having mild soreness and stiffness from wearing the orthosis so long.  OT expects this soreness and stiffness to resolve with light stretches that were given as well as performance of daily routines.  He was strongly cautioned to not "overuse" his hand and thumb or as triggering would likely return.  He can wear his orthosis as needed now and continue with stretches and light massage to the thumb.  If he cannot manage the symptoms and triggering returns and is painful-he was educated that he should probably get a referral to a hand surgeon for this issue.  He states understanding all directions in discharge today.  Education / Equipment: Pt has all needed materials and education. Pt understands how to continue on with self-management. See tx notes for more details.   Patient agrees to discharge due to max benefits received from outpatient occupational therapy / hand therapy at this time.     PLAN:  OT FREQUENCY: one time visit  OT DURATION: 1 additional week from 05/27/2023 - 06/02/22 to cover today's last visit     Fannie Knee, OTR/L, CHT 06/02/23          END OF SESSION:  OT End of Session - 06/02/23 1433     Visit Number 4    Number of Visits 8    Date for OT Re-Evaluation 05/27/23    Authorization Type [employer]    OT Start Time 1433    Equipment Utilized During  Treatment Orthotic materials    Activity Tolerance Patient tolerated treatment well;No increased pain;Patient limited by fatigue;Patient limited by pain    Behavior During Therapy [facility] for tasks assessed/performed             Past Medical History:  Diagnosis Date   Allergy    Anemia    Anxiety    Blood transfusion without reported diagnosis    Chronic bronchitis    Chronic cough    Diabetes (HCC) 07-2011   per Dr Sharl Ma   DJD (degenerative joint disease)    Dyslipidemia    Esophageal polyp    GERD (gastroesophageal reflux disease)    s/p [employer] F.   Hemorrhoids, internal    History of gastrointestinal hemorrhage    Hypertension    Hypogonadism, male    per Dr Sharl Ma   Neuromuscular disorder [facility])    neuropathy feet   Neuropathy    per neuro-Dr Charlsie Quest vicodin   OSA (obstructive sleep apnea)    on CPAP   Sleep apnea    CPAP at night   Vitamin D deficiency    Past Surgical History:  Procedure Laterality Date   ANAL FISSURE REPAIR     hand fracture surgery     left   KNEE ARTHROSCOPY  2007   Dr. August Saucer   LAPAROSCOPIC NISSEN FUNDOPLICATION  05/1999   Dr. Daphine Deutscher   NOSE SURGERY  TONSILLECTOMY     UPPER GASTROINTESTINAL ENDOSCOPY     Patient Active Problem List   Diagnosis Date Noted   Pain in right hand 04/01/2023   BPH (benign prostatic hyperplasia) 05/27/2016   PCP NOTES >>>>>>> 03/17/2015   Chronic cough 01/16/2015   Other malaise and fatigue 01/04/2013   Chronic sinusitis with recurrent bronchitis 06/15/2012   Annual physical exam 06/01/2012   Hyperglycemia 07/30/2011   Hypogonadism, male 11/26/2010   DYSPNEA 02/04/2010   VITAMIN D DEFICIENCY 12/25/2008   Hyperlipidemia 12/25/2008   POLYPS, ESOPHAGEAL 09/16/2008   HEMORRHOIDS, INTERNAL 09/16/2008   ANEMIA, HX OF 09/16/2008   GASTROINTESTINAL HEMORRHAGE, HX OF 09/16/2008   Osteoarthritis 04/17/2008   Neuropathy-- f/u neuro, on hydrocodone (by neuro) 07/22/2007   Obesity 07/05/2007   Anxiety state  07/05/2007   Obstructive sleep apnea 07/05/2007   Essential hypertension 07/05/2007   GERD 07/05/2007   IRRITABLE BOWEL SYNDROME 07/05/2007   HEADACHE 07/05/2007    ONSET DATE: About 7 weeks ago after a fall  REFERRING DIAG: M79.641 (ICD-10-CM) - Pain in right hand   THERAPY DIAG:  Muscle weakness (generalized)  Pain in right hand  Other lack of coordination  Acute pain of right shoulder  Rationale for Evaluation and Treatment: Rehabilitation  PERTINENT HISTORY:  Pain over volar surface of thumb, worst pain is in right shoulder at night.  He states 7 weeks of pain at the Rt thumb volarly.  He fell about 7 weeks ago, subsequently needing an injection in his neck for pain, also states Rt shoulder pain and problems sleeping.  His Rt thumb has also been "locking" and painful since.  He also has a history of peripheral neuropathy at baseline  PRECAUTIONS: None ;  RED FLAGS: None   WEIGHT BEARING RESTRICTIONS: No, but was recommended to lift no more than 10 pounds or anything that was painful to him    SUBJECTIVE:   SUBJECTIVE STATEMENT: 13 weeks s/p fall on Rt hand.  He states he wore his thumb spica hand-based orthosis faithfully for over 3 weeks, and decided to remove it this past Monday.  He states less pain, less clicking and popping, but being a little stiff and sore from wearing the orthosis so long.   PAIN:  Are you having pain? None now at rest    PATIENT GOALS: To decrease pain in the shoulder and stop the pain and clicking in his right thumb  NEXT MD VISIT: As needed   OBJECTIVE: (All objective assessments below are from initial evaluation on: 04/14/23 unless otherwise specified.)   HAND DOMINANCE: Right   ADLs: Overall ADLs: States much less functional problems at home now per QuickDASH, but he has been "taking it easy"  FUNCTIONAL OUTCOME MEASURES: 06/02/23: Quick DASH: 8% impaired today   Eval: Quick DASH 26% impairment today  (Higher % Score  =  More  Impairment)    UPPER EXTREMITY ROM     Shoulder to Wrist AROM Right eval Rt 06/02/23  Shoulder flexion Approx 135* scaption   Shoulder abduction    Shoulder extension    Shoulder internal rotation 25* 36*   Shoulder external rotation 75* 82*   Elbow flexion WFL   Elbow extension WFL   Forearm supination WFL   Forearm pronation  WFL   Wrist flexion    Wrist extension    Wrist ulnar deviation    Wrist radial deviation    (Blank rows = not tested)   Hand AROM Right eval Rt 06/02/23  Full  Fist Ability (or Gap to Distal Palmar Crease) WFL Full fist  Thumb Opposition  (Kapandji Scale)   Demos at least 6/10 today, and anecdotally states that his thumb is moving much better than it was before just with some stiffness.  Thumb MCP (0-60)    Thumb IP (0-80)    Thumb Radial Abduction Span    Thumb Palmar Abduction Span    (Blank rows = not tested)   UPPER EXTREMITY MMT:     MMT Right 06/02/23  Wrist flexion 5/5  Wrist extension 5/5  (Blank rows = not tested)  HAND FUNCTION: 06/02/23: 41# grip Rt today   04/21/23:  Grip strength Right: 44 lbs pain, Left: 70 lbs   Eval: Observed weakness in affected Rt hand, as seen by locking popping and pain though the details were not tested to allow rest for his hand.  Will be tested in the next 1-2 sessions as tolerated  OBSERVATIONS:   06/02/23: No significant tenderness to palpation in the right thumb MCP joint now, slight soreness and stiffness from being in the orthosis for 4 weeks, but he does state that this is better motion for him now.  No significant observed clicking or popping today in right thumb.  He has no complaints in the right shoulder now, his internal rotation and external rotation are improved, but he does state chronic left upper neck nerve problems for which he is following up with other doctors for.   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  CMC joint is largely nontender or symptomatic and the IP joint is non-tender (though he does have some interesting popping there as well which may need further investigation.   He does not have much difficulty reaching his shoulder up and scaption and this is not painful but he does have some pain near the [facility] joint/rotator cuff attachment, internal rotation is severely limited compared to external rotation.  Seems to have strained his rotator cuff.   He also has left-sided neck pain and stiffness and has seen a doctor for this, OT has recommended light range of motion is not painful and referral to physical therapy if he needs anything more detailed to help him manage his neck pain.  TODAY'S TREATMENT:  06/02/23: OT checks his right thumb with the findings listed above.  He performs active range of motion and gripping and discusses home and functional activities.  We also discussed self-care and safety and how to continue to manage this trigger thumb.  He was recommended to have a compression glove for light thumb support or at least a padded glove when doing work to protect the MCP joint.  He was shown how to do composite thumb flexion stretches, gentle thumb extension stretches and a self massage to help keep trigger thumb managed.  Again we discussed changing habits and routines and even possibly going back to a rigid orthosis if needed to rest a sore or painful thumb.  He was cautioned to be not repetitive when he begins doing yard work soon.  OT does discussed with him that if his triggering thumb returns and is not able to be managed, he should probably have a referral to a hand surgeon for minor surgery to release this triggering issue.  He states understanding all directions and agrees to discharge today with maximum benefit gained from  occupational therapy at this time.  PATIENT EDUCATION: Education details: See tx section above for details  Person educated: Patient Education method: Verbal Instruction, Teach back, Handouts  Education comprehension: States and demonstrates understanding   HOME EXERCISE PROGRAM: Access Code: [email or web address] URL: [email or web address] Date: 04/14/2023 Prepared by: Fannie Knee   GOALS: Goals reviewed with patient? Yes   SHORT TERM GOALS: (STG required if POC>30 days) Target Date: 04/29/2023  Pt will obtain protective, custom orthotic. Goal status: 04/14/2023: MET   2.  Pt will demo/state understanding of initial HEP to improve pain levels and prerequisite motion. Goal status: 06/02/23: MET   LONG TERM GOALS: Target Date: 05/27/2023  Pt will improve functional ability by decreased impairment per Quick DASH  assessment from 26% to 15% or better, for better quality of life. Goal status: 06/02/23: MET  2.  Pt will improve grip strength in right hand from 44# baseline to at least 60# pending baseline for functional use at home and in IADLs. Goal status: 06/02/23: NOT met- now 41#, likely due to stiffness from wearing thumb brace for 4 weeks, expected to improve with I/ADL performance   3.  Pt will improve A/ROM in right shoulder internal rotation from 25 degrees to at least 40 degrees, to have less pain when sleeping at night.  Goal status: 06/02/23: Partially Met- now 36*   4.  Pt will improve coordination skills in right hand, as seen by within functional limit score on nine-hole peg testing, without significant pain, to have increased functional ability to carry out fine motor tasks (fasteners, etc.) and more complex, coordinated IADLs (meal prep, sports, etc.).  Goal status: 06/02/23: N/A dicscharge- no coordination complaints per pt now   6.  Pt will decrease pain at worst from 7-8/10 to 3/10 or better to have better sleep and occupational participation in daily roles. Goal  status: 06/02/23: MET- now 2-3/10 at worst in the past weeks    ASSESSMENT:  CLINICAL IMPRESSION: 06/02/23: He was faithful with wearing a thumb spica orthosis for about 4 weeks to help rest his triggering right thumb.  Now he does have less pain, less triggering, and he states able to move his thumb better now-also having mild soreness and stiffness from wearing the orthosis so long.  OT expects this soreness and stiffness to resolve with light stretches that were given as well as performance of daily routines.  He was strongly cautioned to not "overuse" his hand and thumb or as triggering would likely return.  He can wear his orthosis as needed now and continue with stretches and light massage to the thumb.  If he cannot manage the symptoms and triggering returns and is painful-he was educated that he should probably get a referral to a hand surgeon for this issue.  He states understanding all directions in discharge today.   PLAN:  OT FREQUENCY: one time visit  OT DURATION: 1 additional week from 05/27/2023 - 06/02/22 to cover today's last visit  PLANNED INTERVENTIONS: 97168 OT Re-evaluation, 97535 self care/ADL training, 40981 therapeutic exercise, 97530 therapeutic activity, 97112 neuromuscular re-education, 97140 manual therapy, 97035 ultrasound, 97039 fluidotherapy, 97010 moist heat, 97010 cryotherapy, 97760 Orthotics management and training, 19147 Splinting (initial encounter), [id number] Subsequent splinting/medication, compression bandaging, Dry needling, coping strategies training, and patient/family education  CONSULTED AND AGREED WITH PLAN OF CARE: Patient  PLAN FOR NEXT SESSION:   Discharge   Fannie Knee, OTR/L, CHT  06/02/2023, 2:33 PM

## 2023-06-02 ENCOUNTER — Encounter: Payer: Self-pay | Admitting: Rehabilitative and Restorative Service Providers"

## 2023-06-02 ENCOUNTER — Ambulatory Visit: Payer: Medicare PPO | Admitting: Rehabilitative and Restorative Service Providers"

## 2023-06-02 DIAGNOSIS — M25511 Pain in right shoulder: Secondary | ICD-10-CM

## 2023-06-02 DIAGNOSIS — M6281 Muscle weakness (generalized): Secondary | ICD-10-CM | POA: Diagnosis not present

## 2023-06-02 DIAGNOSIS — R278 Other lack of coordination: Secondary | ICD-10-CM | POA: Diagnosis not present

## 2023-06-02 DIAGNOSIS — M79641 Pain in right hand: Secondary | ICD-10-CM

## 2023-06-09 DIAGNOSIS — R338 Other retention of urine: Secondary | ICD-10-CM | POA: Diagnosis not present

## 2023-06-09 DIAGNOSIS — R972 Elevated prostate specific antigen [PSA]: Secondary | ICD-10-CM | POA: Diagnosis not present

## 2023-06-13 ENCOUNTER — Encounter: Payer: Self-pay | Admitting: Internal Medicine

## 2023-06-14 ENCOUNTER — Other Ambulatory Visit (HOSPITAL_COMMUNITY): Payer: Self-pay | Admitting: Urology

## 2023-06-14 DIAGNOSIS — C778 Secondary and unspecified malignant neoplasm of lymph nodes of multiple regions: Secondary | ICD-10-CM

## 2023-06-14 DIAGNOSIS — C7951 Secondary malignant neoplasm of bone: Secondary | ICD-10-CM

## 2023-06-14 DIAGNOSIS — R972 Elevated prostate specific antigen [PSA]: Secondary | ICD-10-CM

## 2023-06-15 DIAGNOSIS — C61 Malignant neoplasm of prostate: Secondary | ICD-10-CM | POA: Diagnosis not present

## 2023-06-16 ENCOUNTER — Encounter (HOSPITAL_BASED_OUTPATIENT_CLINIC_OR_DEPARTMENT_OTHER): Payer: Self-pay | Admitting: Emergency Medicine

## 2023-06-16 ENCOUNTER — Emergency Department (HOSPITAL_BASED_OUTPATIENT_CLINIC_OR_DEPARTMENT_OTHER)
Admission: EM | Admit: 2023-06-16 | Discharge: 2023-06-16 | Disposition: A | Discharge status: Home / Self Care | Attending: Emergency Medicine | Admitting: Emergency Medicine

## 2023-06-16 DIAGNOSIS — E119 Type 2 diabetes mellitus without complications: Secondary | ICD-10-CM | POA: Insufficient documentation

## 2023-06-16 DIAGNOSIS — I10 Essential (primary) hypertension: Secondary | ICD-10-CM | POA: Diagnosis not present

## 2023-06-16 DIAGNOSIS — R339 Retention of urine, unspecified: Secondary | ICD-10-CM | POA: Diagnosis not present

## 2023-06-16 DIAGNOSIS — Z87891 Personal history of nicotine dependence: Secondary | ICD-10-CM | POA: Insufficient documentation

## 2023-06-16 LAB — URINALYSIS, ROUTINE W REFLEX MICROSCOPIC
Bilirubin Urine: NEGATIVE
Glucose, UA: NEGATIVE mg/dL
Ketones, ur: NEGATIVE mg/dL
Leukocytes,Ua: NEGATIVE
Nitrite: NEGATIVE
Protein, ur: NEGATIVE mg/dL
Specific Gravity, Urine: 1.02 (ref 1.005–1.030)
pH: 7 (ref 5.0–8.0)

## 2023-06-16 LAB — URINALYSIS, MICROSCOPIC (REFLEX)

## 2023-06-16 MED ORDER — LIDOCAINE HCL URETHRAL/MUCOSAL 2 % EX GEL
1.0000 | Freq: Once | CUTANEOUS | Status: AC | PRN
  Administered 2023-06-16: 1 via URETHRAL
  Filled 2023-06-16: qty 11

## 2023-06-16 NOTE — ED Provider Notes (Signed)
 [facility]EMERGENCY DEPT [facility] Emergency Department Provider Note MRN:  [id number]  Arrival date & time: 06/16/23     Chief Complaint   Urinary Retention   History of Present Illness   Ronald King is a 78 y.o. year-old male with a history of hypertension, diabetes presenting to the ED with chief complaint of urinary retention.  Patient had a Foley catheter in place for a week, it was successfully removed by his urologist.  Lottie Rater prostate biopsy yesterday, this early morning unable to void.  Explains that he has these spasms where he cannot get the urine out.  Dribbling minimal urine for the past few hours.  Feeling very full pressure in the lower abdomen.  Review of Systems  A thorough review of systems was obtained and all systems are negative except as noted in the HPI and PMH.   Patient's Health History    Past Medical History:  Diagnosis Date   Allergy    Anemia    Anxiety    Blood transfusion without reported diagnosis    Chronic bronchitis    Chronic cough    Diabetes (HCC) 07-2011   per Dr Sharl Ma   DJD (degenerative joint disease)    Dyslipidemia    Esophageal polyp    GERD (gastroesophageal reflux disease)    s/p [employer] F.   Hemorrhoids, internal    History of gastrointestinal hemorrhage    Hypertension    Hypogonadism, male    per Dr Sharl Ma   Neuromuscular disorder [facility])    neuropathy feet   Neuropathy    per neuro-Dr Charlsie Quest vicodin   OSA (obstructive sleep apnea)    on CPAP   Sleep apnea    CPAP at night   Vitamin D deficiency     Past Surgical History:  Procedure Laterality Date   ANAL FISSURE REPAIR     hand fracture surgery     left   KNEE ARTHROSCOPY  2007   Dr. August Saucer   LAPAROSCOPIC NISSEN FUNDOPLICATION  05/1999   Dr. Daphine Deutscher   NOSE SURGERY     TONSILLECTOMY     UPPER GASTROINTESTINAL ENDOSCOPY      Family History  Problem Relation Age of Onset   Cerebral aneurysm Father    Alcohol abuse Father    Neuropathy Mother     Heart disease Mother        died at age 57   Migraines Brother        several   Diabetes Maternal Uncle    CAD Brother        CABG in his 58s   Colon cancer Neg Hx    Prostate cancer Neg Hx    Esophageal cancer Neg Hx    Stomach cancer Neg Hx    Rectal cancer Neg Hx     Social History   Socioeconomic History   Marital status: Married    Spouse name: Not on file   Number of children: 1   Years of education: Not on file   Highest education level: Not on file  Occupational History   Occupation: Regulatory affairs officer, retired    Comment: retired   Occupation: minister    Comment: [employer]  Tobacco Use   Smoking status: Former    Current packs/day: 0.00    Average packs/day: 1 pack/day for 3.0 years (3.0 ttl pk-yrs)    Types: Cigarettes    Start date: 03/29/1962    Quit date: 03/29/1965    Years  since quitting: 58.2   Smokeless tobacco: Never  Vaping Use   Vaping status: Never Used  Substance and Sexual Activity   Alcohol use: No   Drug use: No   Sexual activity: Not on file  Other Topics Concern   Not on file  Social History Narrative   Lives w/ wife   Social Drivers of Health   Financial Resource Strain: Low Risk  (04/30/2021)   Overall Financial Resource Strain (CARDIA)    Difficulty of Paying Living Expenses: Not hard at all  Food Insecurity: No Food Insecurity (05/27/2022)   Hunger Vital Sign    Worried About Running Out of Food in the Last Year: Never true    Ran Out of Food in the Last Year: Never true  Transportation Needs: No Transportation Needs (05/27/2022)   PRAPARE - Administrator, Civil Service (Medical): No    Lack of Transportation (Non-Medical): No  Physical Activity: Sufficiently Active (04/30/2021)   Exercise Vital Sign    Days of Exercise per Week: 7 days    Minutes of Exercise per Session: 30 min  Stress: No Stress Concern Present (04/30/2021)   [employer] of Occupational Health - Occupational Stress  Questionnaire    Feeling of Stress : Not at all  Social Connections: Moderately Integrated (04/30/2021)   Social Connection and Isolation Panel [NHANES]    Frequency of Communication with Friends and Family: More than three times a week    Frequency of Social Gatherings with Friends and Family: More than three times a week    Attends Religious Services: More than 4 times per year    Active Member of [employer] or Organizations: No    Attends Banker Meetings: Never    Marital Status: Married  Catering manager Violence: Not At Risk (05/27/2022)   Humiliation, Afraid, Rape, and Kick questionnaire    Fear of Current or Ex-Partner: No    Emotionally Abused: No    Physically Abused: No    Sexually Abused: No     Physical Exam   Vitals:   06/16/23 0637  BP: (!) 159/81  Pulse: 96  Resp: (!) 22  Temp: 98.9 F (37.2 C)  SpO2: 97%    CONSTITUTIONAL: Well-appearing, appears mildly uncomfortable NEURO/PSYCH:  Alert and oriented x 3, no focal deficits EYES:  eyes equal and reactive ENT/NECK:  no LAD, no JVD CARDIO: Regular rate, well-perfused, normal S1 and S2 PULM:  CTAB no wheezing or rhonchi GI/GU:  non-distended, non-tender MSK/SPINE:  No gross deformities, no edema SKIN:  no rash, atraumatic   *Additional and/or pertinent findings included in MDM below  Diagnostic and Interventional Summary    EKG Interpretation Date/Time:    Ventricular Rate:    PR Interval:    QRS Duration:    QT Interval:    QTC Calculation:   R Axis:      Text Interpretation:         Labs Reviewed  URINALYSIS, ROUTINE W REFLEX MICROSCOPIC    No orders to display    Medications  lidocaine (XYLOCAINE) 2 % jelly 1 Application (1 Application Urethral Given by Other 06/16/23 0645)     Procedures  /  Critical Care Procedures  ED Course and Medical Decision Making  Initial Impression and Ddx Patient presenting with urinary retention based on exam he is distended in the lower abdomen,  uncomfortable, cannot get the urine out.  Will place catheter here in the emergency department, sounds like he has elevated PSA  and underwent prostate biopsy yesterday.  Suspect has prostatic hyperplasia versus malignancy.  Overall doubt complication of biopsy, will reassess after catheter.  Past medical/surgical history that increases complexity of ED encounter: Diabetes  Interpretation of Diagnostics Urinalysis pending  Patient Reassessment and Ultimate Disposition/Management     Signed out to oncoming provider at shift change.  Patient management required discussion with the following services or consulting groups:  None  Complexity of Problems Addressed Acute illness or injury that poses threat of life of bodily function  Additional Data Reviewed and Analyzed Further history obtained from: None  Additional Factors Impacting ED Encounter Risk None  Elmer Sow. Pilar Plate, MD [facility] Emergency Medicine [facility] [email or web address]  Final Clinical Impressions(s) / ED Diagnoses     ICD-10-CM   1. Urinary retention  R33.9       ED Discharge Orders     None        Discharge Instructions Discussed with and Provided to Patient:   Discharge Instructions   None      Sabas Sous, MD 06/16/23 [phone]

## 2023-06-16 NOTE — ED Provider Notes (Signed)
 Prostate bx yesterday. Getting foley. Anticipate d/c. Physical Exam  BP (!) 159/81 (BP Location: Right Arm)   Pulse 96   Temp 98.9 F (37.2 C) (Oral)   Resp (!) 22   Ht 6\' 1"  (1.854 m)   Wt 115.7 kg   SpO2 97%   BMI 33.64 kg/m   Physical Exam  Procedures  Procedures  ED Course / MDM    Medical Decision Making Amount and/or Complexity of Data Reviewed Labs: ordered.   No signs of infection urine.  Stable for discharge.       Arby Barrette, MD 06/16/23 [id number]

## 2023-06-16 NOTE — ED Notes (Signed)
 Leg bag attached before discharge. Pt and family educated on use. Also provided extra urine bag for nighttime upon request.

## 2023-06-16 NOTE — ED Triage Notes (Signed)
 Pt has prostate issue and had a cath in for 7 days, removed yesterday, and had a prostate biopsy.

## 2023-06-16 NOTE — ED Notes (Signed)
 Pt alert and oriented X 4 at the time of discharge. RR even and unlabored. No acute distress noted. Pt verbalized understanding of discharge instructions as discussed. Pt ambulatory to lobby at time of discharge.

## 2023-06-18 DIAGNOSIS — J42 Unspecified chronic bronchitis: Secondary | ICD-10-CM | POA: Diagnosis not present

## 2023-06-21 ENCOUNTER — Ambulatory Visit (HOSPITAL_COMMUNITY)
Admission: RE | Admit: 2023-06-21 | Discharge: 2023-06-21 | Disposition: A | Discharge status: Home / Self Care | Source: Ambulatory Visit | Attending: Urology | Admitting: Urology

## 2023-06-21 DIAGNOSIS — R972 Elevated prostate specific antigen [PSA]: Secondary | ICD-10-CM | POA: Insufficient documentation

## 2023-06-21 DIAGNOSIS — C801 Malignant (primary) neoplasm, unspecified: Secondary | ICD-10-CM | POA: Diagnosis not present

## 2023-06-21 DIAGNOSIS — C7952 Secondary malignant neoplasm of bone marrow: Secondary | ICD-10-CM | POA: Insufficient documentation

## 2023-06-21 DIAGNOSIS — C778 Secondary and unspecified malignant neoplasm of lymph nodes of multiple regions: Secondary | ICD-10-CM | POA: Insufficient documentation

## 2023-06-21 DIAGNOSIS — C7951 Secondary malignant neoplasm of bone: Secondary | ICD-10-CM | POA: Diagnosis not present

## 2023-06-21 MED ORDER — FLOTUFOLASTAT F 18 GALLIUM 296-5846 MBQ/ML IV SOLN
8.3000 | Freq: Once | INTRAVENOUS | Status: AC
  Administered 2023-06-21: 8.3 via INTRAVENOUS

## 2023-06-22 ENCOUNTER — Other Ambulatory Visit: Payer: Medicare PPO

## 2023-06-23 ENCOUNTER — Encounter (HOSPITAL_COMMUNITY)

## 2023-06-24 DIAGNOSIS — R972 Elevated prostate specific antigen [PSA]: Secondary | ICD-10-CM | POA: Diagnosis not present

## 2023-07-04 ENCOUNTER — Encounter (HOSPITAL_COMMUNITY): Payer: Self-pay | Admitting: Urology

## 2023-07-05 ENCOUNTER — Other Ambulatory Visit: Payer: Self-pay | Admitting: Gastroenterology

## 2023-07-05 NOTE — Telephone Encounter (Signed)
Must keep scheduled appointment

## 2023-07-06 ENCOUNTER — Encounter (HOSPITAL_BASED_OUTPATIENT_CLINIC_OR_DEPARTMENT_OTHER): Payer: Self-pay

## 2023-07-06 ENCOUNTER — Other Ambulatory Visit: Payer: Self-pay

## 2023-07-06 ENCOUNTER — Emergency Department (HOSPITAL_BASED_OUTPATIENT_CLINIC_OR_DEPARTMENT_OTHER)
Admission: EM | Admit: 2023-07-06 | Discharge: 2023-07-06 | Disposition: A | Discharge status: Home / Self Care | Attending: Emergency Medicine | Admitting: Emergency Medicine

## 2023-07-06 DIAGNOSIS — N3001 Acute cystitis with hematuria: Secondary | ICD-10-CM | POA: Diagnosis not present

## 2023-07-06 DIAGNOSIS — N401 Enlarged prostate with lower urinary tract symptoms: Secondary | ICD-10-CM | POA: Diagnosis not present

## 2023-07-06 DIAGNOSIS — C7951 Secondary malignant neoplasm of bone: Secondary | ICD-10-CM | POA: Diagnosis not present

## 2023-07-06 DIAGNOSIS — R339 Retention of urine, unspecified: Secondary | ICD-10-CM | POA: Diagnosis not present

## 2023-07-06 DIAGNOSIS — Z7982 Long term (current) use of aspirin: Secondary | ICD-10-CM | POA: Diagnosis not present

## 2023-07-06 DIAGNOSIS — C61 Malignant neoplasm of prostate: Secondary | ICD-10-CM | POA: Diagnosis not present

## 2023-07-06 DIAGNOSIS — R338 Other retention of urine: Secondary | ICD-10-CM | POA: Diagnosis not present

## 2023-07-06 DIAGNOSIS — R000 Tachycardia, unspecified: Secondary | ICD-10-CM | POA: Insufficient documentation

## 2023-07-06 DIAGNOSIS — Z8546 Personal history of malignant neoplasm of prostate: Secondary | ICD-10-CM | POA: Diagnosis not present

## 2023-07-06 LAB — URINALYSIS, W/ REFLEX TO CULTURE (INFECTION SUSPECTED)
Bilirubin Urine: NEGATIVE
Glucose, UA: NEGATIVE mg/dL
Ketones, ur: NEGATIVE mg/dL
Nitrite: POSITIVE — AB
Protein, ur: NEGATIVE mg/dL
Specific Gravity, Urine: 1.015 (ref 1.005–1.030)
pH: 6 (ref 5.0–8.0)

## 2023-07-06 MED ORDER — CEPHALEXIN 250 MG PO CAPS
500.0000 mg | ORAL_CAPSULE | Freq: Once | ORAL | Status: AC
  Administered 2023-07-06: 500 mg via ORAL
  Filled 2023-07-06: qty 2

## 2023-07-06 MED ORDER — LIDOCAINE HCL URETHRAL/MUCOSAL 2 % EX GEL
1.0000 | Freq: Once | CUTANEOUS | Status: AC
  Administered 2023-07-06: 1 via URETHRAL
  Filled 2023-07-06: qty 11

## 2023-07-06 MED ORDER — CEPHALEXIN 500 MG PO CAPS: 500.0000 mg | ORAL_CAPSULE | Freq: Four times a day (QID) | ORAL | 0 refills | Status: DC

## 2023-07-06 NOTE — ED Triage Notes (Signed)
 Pt. Reports having catheter taking out this morning, and reports hasn't been able to fully void since being removed.

## 2023-07-06 NOTE — ED Provider Notes (Signed)
 [place] [facility] Provider Note   CSN: [id number] Arrival date & time: 07/06/23  2127     History  Chief Complaint  Patient presents with   Urinary Retention    Ronald King is a 78 y.o. male.  78 year old male presents today for concern of urinary retention.  He was recently seen in the emergency department for similar complaint and had a Foley catheter placed.  He followed up with urologist today and have his Foley catheter removed this morning.  He states throughout the day he developed urinary retention.  Appears uncomfortable on exam.  Denies any other complaints.  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  The history is provided by the patient and a relative. No language interpreter was used.       Home Medications Prior to Admission medications   Medication Sig Start Date End Date Taking? Authorizing Provider  amLODipine (NORVASC) 10 MG tablet Take 1 tablet (10 mg total) by mouth daily. 04/25/23   Wanda Plump, MD  amoxicillin (AMOXIL) 500 MG tablet Take 1 tablet (500 mg total) by mouth 3 (three) times daily with meals. As needed for bronchitis flares 12/15/22   Coralyn Helling, MD  ascorbic acid (VITAMIN C) 500 MG tablet Take 1,000 mg by mouth daily.    [provider]  aspirin EC 81 MG tablet Take 1 tablet (81 mg total) by mouth daily. Swallow whole. 11/12/21   Wanda Plump, MD  Azelastine-Fluticasone [phone] MCG/ACT SUSP Place 1 spray into the nose in the morning and at bedtime. 06/16/22   Cobb, Ruby Cola, NP  budesonide (PULMICORT) 0.5 MG/2ML nebulizer solution Take 2 mLs (0.5 mg total) by nebulization 2 (two) times daily as needed (shortness of breath, wheezing, cough). 06/16/22   Cobb, Ruby Cola, NP  Calcium Carbonate-Vitamin D 600-400 MG-UNIT tablet Take 1 tablet by mouth daily.    [provider]  clonazePAM (KLONOPIN) 1 MG tablet TAKE 1 TABLET(1 MG) BY MOUTH  TWICE DAILY 04/28/23   Wanda Plump, MD  Coenzyme Q10 (CO Q 10 PO) Take 1 tablet by mouth daily.     [provider]  Cyanocobalamin (B-12) 2000 MCG TABS Take 1 tablet by mouth daily.    [provider]  Dulaglutide (TRULICITY) 0.75 MG/0.5ML SOPN Inject 0.75 mg into the skin once a week. 07/17/20   [provider]  ezetimibe (ZETIA) 10 MG tablet Take 1 tablet (10 mg total) by mouth daily. 01/04/23   Wanda Plump, MD  fenofibrate (TRICOR) 145 MG tablet Take 1 tablet (145 mg total) by mouth daily. 03/11/23   Wanda Plump, MD  HYDROcodone bit-homatropine [facility]) 5-1.5 MG/5ML syrup Take 5 mLs by mouth every 6 (six) hours as needed for cough. 12/15/22   Coralyn Helling, MD  lansoprazole (PREVACID) 30 MG capsule TAKE 1 CAPSULE(30 MG) BY MOUTH TWICE DAILY BEFORE A MEAL 07/05/23   Nandigam, Eleonore Chiquito, MD  methocarbamol (ROBAXIN) 500 MG tablet Take 1 tablet (500 mg total) by mouth every 6 (six) hours as needed for muscle spasms. 12/14/22   Persons, [place], PA  Multiple Vitamins-Minerals (CENTRUM SILVER PO) Take 1 tablet by mouth daily. Reported on 03/17/2015    [provider]  PARoxetine (PAXIL) 20 MG tablet Take 1 tablet (20 mg total) by mouth daily. 04/25/23   Wanda Plump, MD  predniSONE (STERAPRED UNI-PAK 21 TAB) 5 MG (21) TBPK tablet Take dosepak  as directed 12/27/22   Cammy Copa, MD  pyridOXINE (VITAMIN B-6) 100 MG tablet Take 100 mg by mouth daily.    [provider]  rosuvastatin (CRESTOR) 40 MG tablet Take 1 tablet (40 mg total) by mouth at bedtime. 03/18/23   Wanda Plump, MD  sucralfate (CARAFATE) 1 g tablet TAKE 1 TABLET(1 GRAM) BY MOUTH TWICE DAILY Patient taking differently: as needed. 09/25/18   Napoleon Form, MD      Allergies    Montelukast, Ceftin [cefuroxime axetil], Duloxetine, Lyrica [pregabalin], Metformin hcl, Neurontin [gabapentin], and Testosterone    Review of Systems   Review of Systems  Constitutional:  Negative for chills and  fever.  Genitourinary:  Positive for difficulty urinating.  All other systems reviewed and are negative.   Physical Exam Updated Vital Signs BP (!) 152/87   Pulse (!) 104   Temp 99.9 F (37.7 C) (Oral)   Resp (!) 22   Wt 110.2 kg   SpO2 93%   BMI 32.06 kg/m  Physical Exam Vitals and nursing note reviewed.  Constitutional:      General: He is in acute distress (Appears uncomfortable).     Appearance: Normal appearance. He is not ill-appearing.  HENT:     Head: Normocephalic and atraumatic.     Nose: Nose normal.  Eyes:     Conjunctiva/sclera: Conjunctivae normal.  Cardiovascular:     Rate and Rhythm: Tachycardia present.  Pulmonary:     Effort: Pulmonary effort is normal. No respiratory distress.  Abdominal:     General: There is distension.  Musculoskeletal:        General: No deformity. Normal range of motion.     Cervical back: Normal range of motion.  Skin:    Findings: No rash.  Neurological:     Mental Status: He is alert.     ED Results / Procedures / Treatments   Labs (all labs ordered are listed, but only abnormal results are displayed) Labs Reviewed  URINALYSIS, W/ REFLEX TO CULTURE (INFECTION SUSPECTED) - Abnormal; Notable for the following components:      Result Value   APPearance CLOUDY (*)    Hgb urine dipstick MODERATE (*)    Nitrite POSITIVE (*)    Leukocytes,Ua SMALL (*)    Bacteria, UA MANY (*)    All other components within normal limits  URINE CULTURE    EKG None  Radiology No results found.  Procedures Procedures    Medications Ordered in ED Medications  lidocaine (XYLOCAINE) 2 % jelly 1 Application (has no administration in time range)    ED Course/ Medical Decision Making/ A&P                                 Medical Decision Making Amount and/or Complexity of Data Reviewed Labs: ordered.   78 year old male presents today for concern of urinary retention.  Had his Foley catheter removed today.  Has history of  prostate cancer. Initially was able to void however throughout the day he started having difficulty until he was in full-blown retention. Uncomfortable on exam initially. Foley catheter placed.  Pain resolved. He does have some hemoglobin and some signs of infection on UA. No flank pain. Keflex started. Urine culture sent. He will follow-up with his urologist. Discharged in stable condition.  Return precaution discussed.  Patient voices understanding and is in agreement with plan.    Final Clinical Impression(s) /  ED Diagnoses Final diagnoses:  Urinary retention  Acute cystitis with hematuria    Rx / DC Orders ED Discharge Orders          Ordered    cephALEXin (KEFLEX) 500 MG capsule  4 times daily        07/06/23 2256              Marita Kansas, PA-C 07/06/23 2311    Linwood Dibbles, MD 07/07/23 [phone]

## 2023-07-06 NOTE — Discharge Instructions (Addendum)
 Symptoms resolved after Foley catheter placement.  Urine showed evidence of infection.  Culture sent for confirmation.  Antibiotic started. Follow-up with urologist.

## 2023-07-06 NOTE — ED Notes (Signed)
 Bladder scan showed 389 mls, PA in room at time of scan

## 2023-07-09 LAB — URINE CULTURE: Culture: >=100000 — AB

## 2023-07-10 ENCOUNTER — Telehealth (HOSPITAL_BASED_OUTPATIENT_CLINIC_OR_DEPARTMENT_OTHER): Payer: Self-pay

## 2023-07-10 NOTE — Telephone Encounter (Signed)
 Post ED Visit - Positive Culture Follow-up  Culture report reviewed by antimicrobial stewardship pharmacist: [facility] []  Court Distance, Pharm.D. []  Skeet Duke, Pharm.D., BCPS AQ-ID [x]   Dionicio Fray, Pharm.D., BCPS []  Garland Junk, [address].D., BCPS []  [place], [address].D., BCPS, AAHIVP []  Alcide Aly, Pharm.D., BCPS, AAHIVP []  Jerri Morale, PharmD, BCPS []  Graham Laws, PharmD, BCPS []  Cleda Curly, PharmD, BCPS []  Tamar Fairly, PharmD []  Ballard Levels, PharmD, BCPS []  Ollen Beverage, PharmD  [facility] []  Arlyne Bering, PharmD []  Sherryle Don, PharmD []  [place], PharmD []  Delila Felty, Rph []  [place]) Cleora Daft, PharmD []  Augustina Block, PharmD []  Arie Kurtz, PharmD []  Sharlyn Deaner, PharmD []  Agnes Hose, PharmD []  Kendall Pauls, PharmD []  Gladstone Lamer, PharmD []  Armanda Bern, PharmD []  Tera Fellows, PharmD   Positive urine culture Treated with Cephalexin, organism sensitive to the same and no further patient follow-up is required at this time.  Delena Feil 07/10/2023, 3:46 PM

## 2023-07-11 NOTE — Progress Notes (Signed)
 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 Christina Coyer ([facility]) elevated PSA.  06/21/2023 Dr. Christina Coyer NM PET ([facility]) Skull to Mid Thigh CLINICAL DATA:  78 year old male with elevated PSA 20.9. Malignant neoplasm of the lymph nodes. Secondary malignant neoplasm of the bone.   IMPRESSION: 1. Unusual pattern of radiotracer activity with no specific activity identified within the primary prostate carcinoma in the LEFT lobe but with intense multifocal radiotracer avid skeletal metastasis. 2. 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 Four lesions identified. 3. Large LEFT prostate lobe mass is not specifically radiotracer avid. 4. LEFT external iliac lymph node has mild nonspecific radiotracer avid. 5. No evidence of visceral metastasis or pulmonary metastasis. 6. Radiotracer avid lesion in the RIGHT lobe of the thyroid gland. Favor primary thyroid lesion. Recommend thyroid ultrasound and potential tissue sampling for further evaluation potential thyroid carcinoma.  05/26/2023 Dr. Christina Coyer MR Prostate with/without Contrast CLINICAL DATA: Elevated PSA equal 21.6. 78 year old male.   IMPRESSION: 1. Large (4 cm) lesion in the LEFT peripheral zone is consistent with high-grade prostate adenocarcinoma. PI-RADS (v2.1): 5. (Dynacad 3D post processing performed). ROI #1. 2. Extensive extracapsular extension along the LEFT lateral prostate gland. 3. Concern for involvement at base of the LEFT seminal vesicle. 4. Probable metastatic adenopathy to a LEFT external iliac node. 5. 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    Past/Anticipated  interventions by urology, if any:     Past/Anticipated chemotherapy by medical oncology, if any: NA   If Spine Met(s), symptoms, if any, include: Bowel/Bladder retention or incontinence (please describe): Catheter Numbness or weakness in extremities (please describe): No reports having neuropathy that may cause tingling in hands/feet. Current Decadron regimen, if applicable: No  Ambulatory status? Walker? Wheelchair?: No  Weight changes, if any:  10 lbs weight loss  IPSS:  24 SHIM:  1  Nausea/Vomiting, if any:  No  Pain issues, if any:  Soreness to catheter site indwelling for 6-7 weeks.  SAFETY ISSUES: Prior radiation?  No Pacemaker/ICD? No Possible current pregnancy?  Male Is the patient on methotrexate? No  Current Complaints / other details:

## 2023-07-13 NOTE — Progress Notes (Signed)
 Radiation Oncology         [phone] ________________________________  Initial Outpatient Consultation  Name: Ronald King MRN: [id number]  Date: 07/14/2023  DOB: [birth year]  CC:King, Ronald Ket, MD  Ronald Coyer, MD   REFERRING PHYSICIAN: Christina Coyer, MD  DIAGNOSIS: 78 y.o. 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    ICD-10-CM   1. Malignant neoplasm of prostate (HCC)  C61     2. Malignant neoplasm of prostate metastatic to bone [facility])  C61    C79.51       HISTORY OF PRESENT ILLNESS: Ronald King is a 78 y.o. male with a diagnosis of prostate cancer. He was initially referred to Dr. Derrick King in urology back in 02/2021 for an elevated PSA of 5. He was treated for UTI and PSA decreased to 4.24 on repeat labs. He developed gross hematuria in 06/2022 and underwent a CT A/P scan, which was negative. Additionally, a cystoscopy in 08/2022 showed BPH with a significant intravesical component but no concerning findings to suggest bladder tumor. His PSA remained stable in the 4s through 08/2022 and increased significantly to 12.5 in 02/2023 and further, to 23.9, by 04/2023. He underwent prostate MRI on 05/26/23 showing a large, 4 cm PI-RADS 5 lesion in the left peripheral zone with extensive extracapsular extension along the left lateral prostate gland and concern for involvement at the base of the left seminal vesicle. Additionally noted was probable metastatic adenopathy involving a left external iliac node and probable multifocal skeletal metastases.  The patient proceeded to transrectal ultrasound with 12 biopsies of the prostate on 06/16/23, digital rectal examination performed at that time showed a larger left prostate lobe without nodules or induration.  The prostate volume measured 167 cc.  Out of 12 core biopsies, 7 were positive.  The maximum Gleason score was 4+4,  and this was seen in all 6 of the left sided cores.  Additionally, there was Gleason 3+3 in 1 of 6 cores on the right.  He developed urinary retention on 06/16/23, following his biopsy and has failed multiple voiding trials since that time.  Dr. Derrick King is planning to schedule a water vapor ablation procedure prior to beginning radiation treatment.  He underwent staging PSMA PET scan on 06/21/23 which confirmed multifocal intense radiotracer-avid skeletal metastases in the pelvis and lumbar spine, involving the left acetabulum, 2 lesions in the left iliac bone and a lesion in the L4 vertebral body. The large mass in the left prostate lobe noted on MRI was not specifically radiotracer-avid and there was only mild, nonspecific radiotracer activity in the left external iliac lymph node.  There was no evidence of visceral or pulmonary metastasis but he was noted to have a radiotracer-avid lesion in the right lobe of the thyroid  gland that warrants further evaluation with ultrasound guided biopsy. He was started on Orgovyx on 06/24/2023, with plans to begin therapy escalation with an ARPI next week.  He has a scheduled follow-up visit in the advanced prostate clinic at [facility] urology on 07/20/2023.  The patient reviewed the biopsy and imaging results with his urologist and he has kindly been referred today for discussion of potential radiation treatment options.   PREVIOUS RADIATION THERAPY: No  PAST MEDICAL HISTORY:  Past Medical History:  Diagnosis Date   Allergy     Anemia    Anxiety    Blood transfusion without reported diagnosis  Chronic bronchitis    Chronic cough    Diabetes (HCC) 07-2011   per Dr Ronald King   DJD (degenerative joint disease)    Dyslipidemia    Esophageal polyp    GERD (gastroesophageal reflux disease)    s/p [employer]   Hemorrhoids, internal    History of gastrointestinal hemorrhage    Hypertension    Hypogonadism, male    per Dr Ronald King   Neuromuscular disorder [facility])     neuropathy feet   Neuropathy    per neuro-Dr Ronald King vicodin   OSA (obstructive sleep apnea)    on CPAP   Sleep apnea    CPAP at night   Vitamin D  deficiency       PAST SURGICAL HISTORY: Past Surgical History:  Procedure Laterality Date   ANAL FISSURE REPAIR     hand fracture surgery     left   KNEE ARTHROSCOPY  2007   Dr. Rozelle King   LAPAROSCOPIC NISSEN FUNDOPLICATION  05/1999   Dr. Gaylyn King   NOSE SURGERY     TONSILLECTOMY     UPPER GASTROINTESTINAL ENDOSCOPY      FAMILY HISTORY:  Family History  Problem Relation Age of Onset   Cerebral aneurysm Father    Alcohol abuse Father    Neuropathy Mother    Heart disease Mother        died at age 63   Migraines Brother        several   Diabetes Maternal Uncle    CAD Brother        CABG in his 81s   Colon cancer Neg Hx    Prostate cancer Neg Hx    Esophageal cancer Neg Hx    Stomach cancer Neg Hx    Rectal cancer Neg Hx     SOCIAL HISTORY:  Social History   Socioeconomic History   Marital status: Married    Spouse name: Not on file   Number of children: 1   Years of education: Not on file   Highest education level: Not on file  Occupational History   Occupation: Regulatory affairs officer, retired    Comment: retired   Occupation: minister    Comment: [employer]  Tobacco Use   Smoking status: Former    Current packs/day: 0.00    Average packs/day: 1 pack/day for 3.0 years (3.0 ttl pk-yrs)    Types: Cigarettes    Start date: 03/29/1962    Quit date: 03/29/1965    Years since quitting: 58.3   Smokeless tobacco: Never  Vaping Use   Vaping status: Never Used  Substance and Sexual Activity   Alcohol use: No   Drug use: No   Sexual activity: Not on file  Other Topics Concern   Not on file  Social History Narrative   Lives w/ wife   Social Drivers of Health   Financial Resource Strain: Low Risk  (04/30/2021)   Overall Financial Resource Strain (CARDIA)    Difficulty of Paying Living Expenses: Not  hard at all  Food Insecurity: No Food Insecurity (05/27/2022)   Hunger Vital Sign    Worried About Running Out of Food in the Last Year: Never true    Ran Out of Food in the Last Year: Never true  Transportation Needs: No Transportation Needs (05/27/2022)   PRAPARE - Administrator, Civil Service (Medical): No    Lack of Transportation (Non-Medical): No  Physical Activity: Sufficiently Active (04/30/2021)   Exercise Vital Sign  Days of Exercise per Week: 7 days    Minutes of Exercise per Session: 30 min  Stress: No Stress Concern Present (04/30/2021)   [employer] of Occupational Health - Occupational Stress Questionnaire    Feeling of Stress : Not at all  Social Connections: Moderately Integrated (04/30/2021)   Social Connection and Isolation Panel [NHANES]    Frequency of Communication with Friends and Family: More than three times a week    Frequency of Social Gatherings with Friends and Family: More than three times a week    Attends Religious Services: More than 4 times per year    Active Member of [employer] or Organizations: No    Attends Banker Meetings: Never    Marital Status: Married  Catering manager Violence: Not At Risk (05/27/2022)   Humiliation, Afraid, Rape, and Kick questionnaire    Fear of Current or Ex-Partner: No    Emotionally Abused: No    Physically Abused: No    Sexually Abused: No    ALLERGIES: Montelukast, Ceftin [cefuroxime axetil], Duloxetine, Lyrica [pregabalin], Metformin hcl, Neurontin [gabapentin], and Testosterone   MEDICATIONS:  Current Outpatient Medications  Medication Sig Dispense Refill   amLODipine  (NORVASC ) 10 MG tablet Take 1 tablet (10 mg total) by mouth daily. 90 tablet 1   amoxicillin  (AMOXIL ) 500 MG tablet Take 1 tablet (500 mg total) by mouth 3 (three) times daily with meals. As needed for bronchitis flares 30 tablet 1   ascorbic acid (VITAMIN C) 500 MG tablet Take 1,000 mg by mouth daily.     aspirin  EC 81 MG  tablet Take 1 tablet (81 mg total) by mouth daily. Swallow whole.     Azelastine -Fluticasone  137-50 MCG/ACT SUSP Place 1 spray into the nose in the morning and at bedtime. 23 g 2   budesonide  (PULMICORT ) 0.5 MG/2ML nebulizer solution Take 2 mLs (0.5 mg total) by nebulization 2 (two) times daily as needed (shortness of breath, wheezing, cough). 75 mL 5   Calcium  Carbonate-Vitamin D  600-400 MG-UNIT tablet Take 1 tablet by mouth daily.     clonazePAM  (KLONOPIN ) 1 MG tablet TAKE 1 TABLET(1 MG) BY MOUTH TWICE DAILY 60 tablet 2   Coenzyme Q10 (CO Q 10 PO) Take 1 tablet by mouth daily.      Cyanocobalamin (B-12) 2000 MCG TABS Take 1 tablet by mouth daily.     Dulaglutide (TRULICITY) 0.75 MG/0.5ML SOPN Inject 0.75 mg into the skin once a week.     ezetimibe  (ZETIA ) 10 MG tablet Take 1 tablet (10 mg total) by mouth daily. 90 tablet 3   fenofibrate  (TRICOR ) 145 MG tablet Take 1 tablet (145 mg total) by mouth daily. 90 tablet 1   finasteride (PROSCAR) 5 MG tablet Take 5 mg by mouth daily.     FLUAD 0.5 ML injection      HYDROcodone  bit-homatropine (HYCODAN) 5-1.5 MG/5ML syrup Take 5 mLs by mouth every 6 (six) hours as needed for cough. 475 mL 0   lansoprazole  (PREVACID ) 30 MG capsule TAKE 1 CAPSULE(30 MG) BY MOUTH TWICE DAILY BEFORE A MEAL 120 capsule 0   methocarbamol  (ROBAXIN ) 500 MG tablet Take 1 tablet (500 mg total) by mouth every 6 (six) hours as needed for muscle spasms. 30 tablet 0   Multiple Vitamins-Minerals (CENTRUM SILVER PO) Take 1 tablet by mouth daily. Reported on 03/17/2015     NUBEQA 300 MG tablet Take 600 mg by mouth 2 (two) times daily.     PARoxetine  (PAXIL ) 20 MG tablet Take 1 tablet (  20 mg total) by mouth daily. 90 tablet 1   predniSONE  (STERAPRED UNI-PAK 21 TAB) 5 MG (21) TBPK tablet Take dosepak as directed 21 tablet 0   pyridOXINE (VITAMIN B-6) 100 MG tablet Take 100 mg by mouth daily.     rosuvastatin  (CRESTOR ) 40 MG tablet Take 1 tablet (40 mg total) by mouth at bedtime. 90 tablet  1   sucralfate  (CARAFATE ) 1 g tablet TAKE 1 TABLET(1 GRAM) BY MOUTH TWICE DAILY (Patient taking differently: as needed.) 30 tablet 1   No current facility-administered medications for this encounter.      REVIEW OF SYSTEMS:  On review of systems, the patient reports that he is doing well overall. He denies any chest pain, shortness of breath, cough, fevers, chills, night sweats, unintended weight changes. He denies any bowel disturbances, and denies abdominal pain, nausea or vomiting. He denies any new musculoskeletal or joint aches or pains. His IPSS was Total Score: 24, indicating severe urinary symptoms (Reference 0-7 mild, 8-19 moderate, 20-35 severe).  His SHIM: 1, indicating he has severe erectile dysfunction (Reference - 22-25 None, 17-21 Mild, 8-16 Moderate, 1-7 Severe). A complete review of systems is obtained and is otherwise negative.     PHYSICAL EXAM:  Wt Readings from Last 3 Encounters:  07/06/23 243 lb (110.2 kg)  06/16/23 255 lb (115.7 kg)  05/24/23 259 lb 12.8 oz (117.8 kg)   Temp Readings from Last 3 Encounters:  07/06/23 99.9 F (37.7 C) (Oral)  06/16/23 98.9 F (37.2 C) (Oral)  05/24/23 98.3 F (36.8 C)   BP Readings from Last 3 Encounters:  07/06/23 (!) 152/87  06/16/23 (!) 159/81  05/24/23 120/80   Pulse Readings from Last 3 Encounters:  07/06/23 (!) 104  06/16/23 96  05/24/23 76    /10  In general this is a well appearing Caucasian male in no acute distress. He's alert and oriented x4 and appropriate throughout the examination. Cardiopulmonary assessment is negative for acute distress, and he exhibits normal effort.     KPS = 90  100 - Normal; no complaints; no evidence of disease. 90   - Able to carry on normal activity; minor signs or symptoms of disease. 80   - Normal activity with effort; some signs or symptoms of disease. 34   - Cares for self; unable to carry on normal activity or to do active work. 60   - Requires occasional assistance, but  is able to care for most of his personal needs. 50   - Requires considerable assistance and frequent medical care. 40   - Disabled; requires special care and assistance. 30   - Severely disabled; hospital admission is indicated although death not imminent. 20   - Very sick; hospital admission necessary; active supportive treatment necessary. 10   - Moribund; fatal processes progressing rapidly. 0     - Dead  Karnofsky DA, Abelmann WH, Craver LS and Burchenal [facility] [phone]) The use of the nitrogen mustards in the palliative treatment of carcinoma: with particular reference to bronchogenic carcinoma Cancer 1 634-56  LABORATORY DATA:  Lab Results  Component Value Date   WBC 9.2 01/03/2023   HGB 15.1 01/03/2023   HCT 45.5 01/03/2023   MCV 91.7 01/03/2023   PLT 207.0 01/03/2023   Lab Results  Component Value Date   NA 142 01/03/2023   K 4.0 01/03/2023   CL 102 01/03/2023   CO2 32 01/03/2023   Lab Results  Component Value Date   ALT 16 01/03/2023  AST 19 01/03/2023   ALKPHOS 46 01/07/2021   BILITOT 0.6 01/07/2021     RADIOGRAPHY: NM PET (PSMA) SKULL TO MID THIGH Result Date: 07/04/2023 CLINICAL DATA:  78 year old male with elevated PSA 20.9. Malignant neoplasm of the lymph nodes. Secondary malignant neoplasm of the bone. EXAM: NUCLEAR MEDICINE PET SKULL BASE TO THIGH TECHNIQUE: 8.3 mCi Flotufolastat (Posluma ) was injected intravenously. Full-ring PET imaging was performed from the skull base to thigh after the radiotracer. CT data was obtained and used for attenuation correction and anatomic localization. COMPARISON:  Prostate MRI 05/26/2023 FINDINGS: NECK Radiotracer avid lesion in the RIGHT lobe of the thyroid  gland measures 13 mm (image 52/series 4) Incidental CT finding: None. CHEST No radiotracer accumulation within mediastinal or hilar lymph nodes. No suspicious pulmonary nodules on the CT scan. Incidental CT finding: None. ABDOMEN/PELVIS Prostate: There is mild radiotracer activity  within the prostate gland without focality. Large LEFT prostate lobe mass identified on MRI is not specifically radiotracer avid. Foley catheter extends through the prostate gland into the bladder. Lymph nodes: Likewise the suspicious LEFT external iliac lymph node is only mildly radiotracer avid with SUV max equal 3.6 on image 189. This node is enlarged to 1.6 cm. No periaortic retroperitoneal radiotracer avid lymphadenopathy. Liver: No evidence of liver metastasis. Incidental CT finding: None. SKELETON Multifocal intense radiotracer avid skeletal metastasis are evident. Example lesion in the LEFT acetabulum with SUV max equal 12.4 on image 197. This is associated 2.8 cm sclerotic lesion on the CT portion exam. Two lesions in the LEFT iliac bone adjacent to the pubic symphysis are radiotracer avid. For example lesion on image 204 with SUV max equal 9.3. Intense activity associated with bone lesion in the RIGHT aspect of the L 4 vertebral body adjacent to the transverse process with SUV max equal 9.2 on image 154. IMPRESSION: 1. Unusual pattern of radiotracer activity with no specific activity identified within the primary prostate carcinoma in the LEFT lobe but with intense multifocal radiotracer avid skeletal metastasis. 2. 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 Four lesions identified. 3. Large LEFT prostate lobe mass is not specifically radiotracer avid. 4. LEFT external iliac lymph node has mild nonspecific radiotracer avid. 5. No evidence of visceral metastasis or pulmonary metastasis. 6. Radiotracer avid lesion in the RIGHT lobe of the thyroid  gland. Favor primary thyroid  lesion. Recommend thyroid  ultrasound and potential tissue sampling for further evaluation potential thyroid  carcinoma. Electronically Signed   By: Deboraha Fallow M.D.   On: 07/04/2023 08:53      IMPRESSION/PLAN: 1. 78 y.o. gentleman with Stage IV, oligometastatic prostate cancer with  Gleason score of 4+4, PSA of 23.9 and disease in a pelvic lymph node and the skeleton.  We discussed the patient's workup and outlined the nature of prostate cancer in this setting. Based on his PSMA PET staging, he is stage IV, oligometastatic disease category. Accordingly, he is eligible for LT-ADT concurrent with 8 weeks of external radiation with a boost to the metastatic disease in the pelvis. We discussed the available radiation techniques, and focused on the details and logistics of delivery.  He is not a candidate for brachytherapy with a prostate volume of 167 g and recent issues with acute urinary retention following his biopsy procedure.  Therefore, we discussed and outlined the risks, benefits, short and long-term effects associated with daily external beam radiotherapy and compared and contrasted these with prostatectomy. We discussed the role of SpaceOAR gel in reducing the rectal toxicity associated  with radiotherapy. We also detailed the role of ADT in the treatment of oligometastatic prostate cancer and outlined the associated side effects that could be expected with this therapy.  He appears to have a good understanding of his disease and our treatment recommendations which are of curative intent.  He understands that while there is a low likelihood for cure, around 25%, there is a very high likelihood for long-term, durable disease control with the proposed treatment.  He was encouraged to ask questions that were answered to his stated satisfaction.  At the conclusion of our conversation, the patient is interested in moving forward with the recommended 8 week course of external beam therapy concurrent with LT-ADT and ARPI. We will initially treat the prostate and pelvic nodes followed by a boost to the prostate and PET positive pelvic skeletal and nodal metastases.  He started ADT with Orgovyx on 06/21/23 so we will share our discussion with Dr. Derrick King and make arrangements for fiducial  markers and SpaceOAR gel placement, first available and possibly concurrent with the water vaporization procedure of the prostate, prior to simulation, to reduce rectal toxicity from radiotherapy. The patient appears to have a good understanding of his disease and our treatment recommendations which are of curative intent and is in agreement with the stated plan.  Therefore, we will move forward with treatment planning accordingly, in anticipation of beginning IMRT approximately 2 months after starting ADT.    2. Thyroid  nodule. He was incidentally found to have a tracer avid thyroid  nodule on recent PSMA PET scan for disease staging of his newly diagnosed Stage IV prostate cancer. We informed the patient of the findings and that we would share these with his PCP, Dr. Neomi Banks, so that he could make arrangements for an U/S biopsy of the thyroid  as recommended by radiology, if he sees appropriate.  We personally spent 70 minutes in this encounter including chart review, reviewing radiological studies, meeting face-to-face with the patient, entering orders and completing documentation.    Arta Bihari, PA-C    Kenith Payer, MD  [facility]  Radiation Oncology Direct Dial: [phone]  Fax: [phone] [email or web address]  Skype  [email or web address]   This document serves as a record of services personally performed by Kenith Payer, MD and Keitha Pata, PA-C. It was created on their behalf by Florance Hun, a trained medical scribe. The creation of this record is based on the scribe's personal observations and the provider's statements to them. This document has been checked and approved by the attending provider.

## 2023-07-14 ENCOUNTER — Encounter: Payer: Self-pay | Admitting: Radiation Oncology

## 2023-07-14 ENCOUNTER — Telehealth: Payer: Self-pay | Admitting: Internal Medicine

## 2023-07-14 ENCOUNTER — Ambulatory Visit
Admission: RE | Admit: 2023-07-14 | Discharge: 2023-07-14 | Disposition: A | Discharge status: Home / Self Care | Source: Ambulatory Visit | Attending: Radiation Oncology | Admitting: Radiation Oncology

## 2023-07-14 VITALS — BP 130/79 | HR 82 | Temp 97.3°F | Resp 18 | Ht 73.0 in | Wt 257.2 lb

## 2023-07-14 DIAGNOSIS — Z87891 Personal history of nicotine dependence: Secondary | ICD-10-CM | POA: Diagnosis not present

## 2023-07-14 DIAGNOSIS — K219 Gastro-esophageal reflux disease without esophagitis: Secondary | ICD-10-CM | POA: Diagnosis not present

## 2023-07-14 DIAGNOSIS — Z79899 Other long term (current) drug therapy: Secondary | ICD-10-CM | POA: Insufficient documentation

## 2023-07-14 DIAGNOSIS — I10 Essential (primary) hypertension: Secondary | ICD-10-CM | POA: Insufficient documentation

## 2023-07-14 DIAGNOSIS — E785 Hyperlipidemia, unspecified: Secondary | ICD-10-CM | POA: Diagnosis not present

## 2023-07-14 DIAGNOSIS — Z191 Hormone sensitive malignancy status: Secondary | ICD-10-CM | POA: Diagnosis not present

## 2023-07-14 DIAGNOSIS — Z8719 Personal history of other diseases of the digestive system: Secondary | ICD-10-CM | POA: Insufficient documentation

## 2023-07-14 DIAGNOSIS — C7951 Secondary malignant neoplasm of bone: Secondary | ICD-10-CM | POA: Diagnosis not present

## 2023-07-14 DIAGNOSIS — E559 Vitamin D deficiency, unspecified: Secondary | ICD-10-CM | POA: Insufficient documentation

## 2023-07-14 DIAGNOSIS — M1990 Unspecified osteoarthritis, unspecified site: Secondary | ICD-10-CM | POA: Insufficient documentation

## 2023-07-14 DIAGNOSIS — Z7951 Long term (current) use of inhaled steroids: Secondary | ICD-10-CM | POA: Diagnosis not present

## 2023-07-14 DIAGNOSIS — C61 Malignant neoplasm of prostate: Secondary | ICD-10-CM | POA: Insufficient documentation

## 2023-07-14 DIAGNOSIS — E119 Type 2 diabetes mellitus without complications: Secondary | ICD-10-CM | POA: Diagnosis not present

## 2023-07-14 DIAGNOSIS — Z7985 Long-term (current) use of injectable non-insulin antidiabetic drugs: Secondary | ICD-10-CM | POA: Insufficient documentation

## 2023-07-14 DIAGNOSIS — Z7982 Long term (current) use of aspirin: Secondary | ICD-10-CM | POA: Insufficient documentation

## 2023-07-14 DIAGNOSIS — C775 Secondary and unspecified malignant neoplasm of intrapelvic lymph nodes: Secondary | ICD-10-CM | POA: Insufficient documentation

## 2023-07-14 DIAGNOSIS — Z87440 Personal history of urinary (tract) infections: Secondary | ICD-10-CM | POA: Diagnosis not present

## 2023-07-14 DIAGNOSIS — G629 Polyneuropathy, unspecified: Secondary | ICD-10-CM | POA: Insufficient documentation

## 2023-07-14 DIAGNOSIS — G4730 Sleep apnea, unspecified: Secondary | ICD-10-CM | POA: Insufficient documentation

## 2023-07-14 DIAGNOSIS — J42 Unspecified chronic bronchitis: Secondary | ICD-10-CM | POA: Diagnosis not present

## 2023-07-14 NOTE — Telephone Encounter (Signed)
 Please call patient. Recent PET scan showed a thyroid abnormality. Arrange for the following: TSH, free T3, free T4. Ultrasound thyroid, possible biopsy. Dx thyroid nodule.

## 2023-07-14 NOTE — Telephone Encounter (Signed)
 Ok, thx

## 2023-07-14 NOTE — Progress Notes (Signed)
 Introduced myself to the patient as the prostate nurse navigator.  He is here to discuss his radiation treatment options and will proceed with 8 weeks of daily radiation.  I gave him my business card and asked him to call me with questions or concerns.  Verbalized understanding.

## 2023-07-14 NOTE — Telephone Encounter (Signed)
 Spoke w/ Pt- he saw Dr Lorri Rota today and they discussed having further testing on the thyroid, Pt is electing not to do further workup for that right now. He is getting ready to start 2 months of radiation therapy.

## 2023-07-15 DIAGNOSIS — G4733 Obstructive sleep apnea (adult) (pediatric): Secondary | ICD-10-CM | POA: Diagnosis not present

## 2023-07-18 DIAGNOSIS — Z191 Hormone sensitive malignancy status: Secondary | ICD-10-CM | POA: Diagnosis not present

## 2023-07-18 DIAGNOSIS — C61 Malignant neoplasm of prostate: Secondary | ICD-10-CM | POA: Diagnosis not present

## 2023-07-19 ENCOUNTER — Other Ambulatory Visit: Payer: Self-pay | Admitting: Urology

## 2023-07-19 DIAGNOSIS — J42 Unspecified chronic bronchitis: Secondary | ICD-10-CM | POA: Diagnosis not present

## 2023-07-20 DIAGNOSIS — R338 Other retention of urine: Secondary | ICD-10-CM | POA: Diagnosis not present

## 2023-07-20 DIAGNOSIS — N401 Enlarged prostate with lower urinary tract symptoms: Secondary | ICD-10-CM | POA: Diagnosis not present

## 2023-07-20 DIAGNOSIS — C61 Malignant neoplasm of prostate: Secondary | ICD-10-CM | POA: Diagnosis not present

## 2023-07-20 DIAGNOSIS — C7951 Secondary malignant neoplasm of bone: Secondary | ICD-10-CM | POA: Diagnosis not present

## 2023-07-20 NOTE — H&P (Signed)
 Office Visit Report     07/20/2023   --------------------------------------------------------------------------------   Ronald King  MRN: [id number]  DOB: [birth year], 78 year old Male  SSN: [id number]   PRIMARY CARE:  Devonna Foley, MD  PRIMARY CARE FAX:  [phone]  REFERRING:  Margery Sheets, MD  PROVIDER:  Christina Coyer, M.D.  LOCATION:  [facility] [phone]     --------------------------------------------------------------------------------   CC/HPI: F/u -   1) PCa -   PSA elevation-as October 2022 PSA was 5.0. PSA usually runs 2.5-3.5 range since 2017. No FH PCa. No GU surgery. No bx. His Jan 2023 PSA was 4.23 and Mar 2023 PSA is 4.24. His Dec 2023 PSA 4.85. His Jun 2024 PSA 4.9. His December 2024 PSA was up significantly to 12.5. Rare bacteria. Urine culture was negative. We went ahead and treated him with some Bactrim as he felt symptomatic ("pain and infection") and repeated the PSA. His January 2025 PSA rose to 19.2 and Feb 2025 to 25.   A March 2025 prostate MRI-most of the left side of the prostate a PI-RADS 5 lesion, possible SV invasion and extracapsular extension. A left iliac node and possible bone lesions in the ischium. Prostate 87 g.   Biopsy:  March 2025 high risk prostate cancer  PSA 24  T2b  GG 4, 7 cores, 10 to 100%, left and right (all 6 left)  GG 1, 1 core, 10%, right  8/12   Staging:  MRI-most of the left side of the prostate a PI-RADS 5 lesion, possible SV invasion and extracapsular extension. A left iliac node and possible bone lesions in the ischium. Prostate 87 g.    2) BPH - He has a h/o low T and saw Dr. Kathyanne Parkers. He's had right testicle pain and some SP discomfort. He feels like he has "an infection". He took "bactrim" for similar. He had to do this with Dr. Milon Aloe back in 2004. UA with 20-40 wbc and few bacteria. Urine cx was negative. Occasional frequency. AUASS - 8. Occasional nocturia. June 2024 cystoscopy  shows BPH with a significant intravesical component. IPSS 16. Urine culture was negative. PVR 155.   He developed urinary retention had a Foley catheter placed. He passed a voiding trial this morning. Given the catheter use we gave him a gram of Rocephin and he took Levaquin.   2) gross hematuria - Apr 2024 - He noted gross hematuria. Red urine and blood per meatus. No clots. Bleeding resolved. He felt an urge. He also has some left testicle pain and wonders if he needs "bactrim" for an infection. He's been coughing. He held his urine for a good while on the phone. He smoked x 2 yrs. His Apr 2024 CT benign. Prostate 50 g. June 2024 cystoscopy - benign.   Today, seen for the above-failed a voiding trial. He had a pansensitive E. coli on culture 07/06/2023 at the hospital. Foley catheter in place and urine draining clear. He is well today. He said he does not feel like he has an infection. No bladder pain.   he underwent PET scan Mar 2025 with no specific activity identified within the primary prostate carcinoma, multifocal intense radiotracer avid skeletal metastasis in the pelvis and lumbar spine (4 lesions), a LEFT external iliac lymph node has mild nonspecific radiotracer avid. Presented at Apr 2025 [facility] - Recommendation was to add abiraterone and prednisone  or darolutamide and then proceed with consolidating radiation given that he has 5 or fewer mets.  He was started Mar 2025 Orgovyx and [place]   PA Ashlyn, moving forward with the recommended 8 week course of external beam therapy concurrent with LT-ADT and ARPI. We will initially treat the prostate and pelvic nodes followed by a boost to the prostate and PET positive pelvic skeletal and nodal metastases. We will make arrangements for fiducial markers and SpaceOAR gel placement.    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     ALLERGIES: cefuroxime Montelukast    MEDICATIONS: Tamsulosin HCl 0.4 MG Capsule 1 capsule  PO BID  Tamsulosin HCl 0.4 MG Capsule 1 capsule PO Q HS  amLODIPine  Besylate 10 MG Tablet  Amoxicillin  500 MG Tablet  Calcium  + D  Centrum Silver  clonazePAM  1 MG Tablet  Co Q-10  Fenofibrate  145 MG Tablet  Hycodan 5-1.5 MG/5ML Solution  levoFLOXacin 750 MG Tablet 1 tablet PO MORNING OF PROCEDURE (3/19)  Nubeqa 300 MG Tablet 2 tablet PO BID  Orgovyx 120 MG Tablet 1 tablet PO Daily  PARoxetine  HCl 20 MG Tablet  Prevacid  30 MG Capsule Delayed Release  Rosuvastatin  Calcium  40 MG Tablet  Sucralfate  1 GM Tablet  Trulicity 0.75 MG/0.5ML Solution Auto-injector  Vitamin B-12 100 MCG Tablet  Vitamin B6  Vitamin B-6 25 MG Tablet tablet  Vitamin C     GU PSH: Cystoscopy - 09/15/2022 Locm 300-399Mg /Ml Iodine,1Ml - 06/30/2022 Prostate Needle Biopsy - 06/15/2023       PSH Notes: fissure, hernia with esophageal wrap per pt   NON-GU PSH: Hand/finger Surgery, Left Hernia Repair Knee Arthroscopy, Left Knee Arthroscopy/surgery, Left Revise Nose Surgical Pathology, Gross And Microscopic Examination For Prostate Needle - 06/15/2023 Visit Complexity (formerly GPC1X) - 07/06/2023, 06/09/2023, 06/17/2022     GU PMH: BPH w/LUTS - 07/06/2023, We discussed depending on his clinical course he may need a TURP or aqua ablation. We went over the nature risk and benefits of the procedure. He said he has heard about the "howitzer". In the short-term discussed starting finasteride and went over the nature risk and benefits of the medicine and he will start., - 06/15/2023, I recommended he start tamsulosin.We went over nature r/b of tamsulosin. He wanted a few more days of abx, - 04/28/2023, We discussed the nature r/b of surveillance, alpha blocker, 5ari, daily pde5i, OAB meds and procedures such as TURP, OBD, LVP, LEP, RWJ, RSLP in the OR or PUL or WVT in the office. , - 09/15/2022, - 03/25/2022 Prostate Cancer - 07/06/2023 Secondary bone metastases - 07/06/2023 Urinary Retention - 07/06/2023, - 06/15/2023, -  06/09/2023 Elevated PSA - 06/24/2023, Sharin David ask if he would need radiation or chemo. We discussed depending on his stage and grade he would need ADT plus a testosterone  receptor blocker such as Nubeqa or abiraterone and we went over the nature risk benefits and alternatives to these medications. Then we would consider whether he would need radiation for curative intent and/or chemotherapy., - 06/15/2023, - 06/09/2023, We discussed the PSA and etiology-could be malignant or inflammation. We discussed the nature risk and benefits of a prostate MRI or proceeding directly to a TRUS biopsy. All questions answered., - 04/28/2023, repeat PSA. consider mri. , - 03/17/2023, disc PSA stable and benign and malignant causes. His PSAD is normal. , - 09/15/2022, CT will also check prostate and PSAD, - 06/17/2022, disc again PSA - PCa vs BPH. UA clear. Disc mri, surv and bx. ALso age specific and overal PSAV. He would like to follow more age  specific levels. , - 03/25/2022, His PSA remains stable. Slight elevation but PSA density likely normal. His exam was benign. We discussed he may have prostate cancer but tends to be low risk when PSA less than 10. All questions answered. He would like to continue surveillance. Will see in 9 mo for PSA and DRE. , - 2023, given bacteria. symptoms and PSA - a short course of abx not unreasonable. Send ABx x 1 week - we went over the nature r/b/a. We discussed the nature, risks and benefits of PSA screening as well as the nature of elevated PSA (benign versus malignant). We discussed the possibility of prostate cancer and that as the PSA rises above the level of 2.5 and even over 1, the risk of prostate cancer on biopsy increases. We discussed the management of prostate cancer might include active surveillance or treatment depending on patient and cancer characteristics. In that context, we discussed the nature, risks and benefits of continued surveillance, other lab tests, transrectal  ultrasound/prostate biopsy, or prostate MRI. All questions answered. , - 03/19/2021, - 2018, - 2018, - 2017 Nocturia - 04/28/2023, - 09/15/2022, - 03/25/2022 Acute prostatitis, send urine for cx and tx with abx. cr 1.14, gfr 63 oct 2024 - 03/17/2023, urine sent for cx , - 03/19/2021 Gross hematuria, benign eval - 09/15/2022, - 06/30/2022, eval with CT scan and cystoscopy - discussed nature r/b of each test. , - 06/17/2022 BPH w/o LUTS - 06/17/2022, - 2023, - 2018, - 2018, - 2017 Primary hypogonadism, Disc nature r/b of T replacement - MACE - blood clots and heart attacks. Interestingly, low T is more associated with PCa and studies have not showed a change in PSA or PCa in those on T replacment. - 03/19/2021, - 2018    NON-GU PMH: Pyuria/other UA findings, send urine for cx and do bactrim x 5 days - 06/17/2022 Anxiety Arthritis Cardiac murmur, unspecified GERD Hypertension Restless legs syndrome Sleep Apnea    FAMILY HISTORY: 1 Daughter - Daughter brain aneurysm - Father Cancer - Brother father deceased at age 53 brain aneurism - Father Heart Disease - Runs in Family mother deceased at age 22 - Mother   SOCIAL HISTORY: Marital Status: Married Preferred Language: English; Ethnicity: Not Hispanic Or Latino; Race: White Current Smoking Status: Patient has never smoked.  Has never drank.  Drinks 3 caffeinated drinks per day. Patient's occupation is/was retired.     Notes: 1 daughter   REVIEW OF SYSTEMS:    GU Review Male:   Patient denies frequent urination, hard to postpone urination, burning/ pain with urination, get up at night to urinate, leakage of urine, stream starts and stops, trouble starting your stream, have to strain to urinate , erection problems, and penile pain.  Gastrointestinal (Upper):   Patient denies nausea, vomiting, and indigestion/ heartburn.  Gastrointestinal (Lower):   Patient denies diarrhea and constipation.  Constitutional:   Patient denies fever, night sweats,  weight loss, and fatigue.  Skin:   Patient denies skin rash/ lesion and itching.  Eyes:   Patient denies blurred vision and double vision.  Ears/ Nose/ Throat:   Patient denies sinus problems and sore throat.  Hematologic/Lymphatic:   Patient denies swollen glands and easy bruising.  Cardiovascular:   Patient denies leg swelling and chest pains.  Respiratory:   Patient denies cough and shortness of breath.  Endocrine:   Patient denies excessive thirst.  Musculoskeletal:   Patient denies back pain and joint pain.  Neurological:   Patient denies  headaches and dizziness.  Psychologic:   Patient denies depression and anxiety.   VITAL SIGNS: None   MULTI-SYSTEM PHYSICAL EXAMINATION:    Constitutional: Well-nourished. No physical deformities. Normally developed. Good grooming.  Neck: Neck symmetrical, not swollen. Normal tracheal position.  Respiratory: No labored breathing, no use of accessory muscles.   Cardiovascular: Normal temperature, normal extremity pulses, no swelling, no varicosities.  Skin: No paleness, no jaundice, no cyanosis. No lesion, no ulcer, no rash.  Neurologic / Psychiatric: Oriented to time, oriented to place, oriented to person. No depression, no anxiety, no agitation.  Gastrointestinal: No mass, no tenderness, no rigidity, non obese abdomen.     Complexity of Data:  Lab Test Review:   PSA, Path Report  Records Review:   Previous Hospital Records  Urine Test Review:   Urine Culture  X-Ray Review: PET- PSMA Scan: Reviewed Films. Discussed With Patient. 2025    05/12/23 04/21/23 03/10/23 09/09/22 03/11/22 06/18/21 04/21/21 02/28/17  PSA  Total PSA 23.90 ng/mL 19.20 ng/mL 12.50 ng/mL 4.94 ng/mL 4.85 ng/mL 4.24 ng/mL 4.23 ng/mL 3.17 ng/mL  Free PSA    1.23 ng/mL 1.28 ng/mL 1.22 ng/mL 1.12 ng/mL   % Free PSA    25 % PSA 26 % PSA 29 % PSA 26 % PSA     04/21/21 03/01/03  Hormones  Testosterone , Total 303.5 ng/dL 1.91     PROCEDURES:          Nurse Visit -  [phone] Mr. Yaskiewiczwas seen in office today for his first APC and to pick up first dispense of Nubeqa 300mg  tablets for the treatment of Prostate Cancer. Medication reconciliation performed with patient and screened for potential drug therapy problems. No current contraindications to begin this medication. Patient was instructed to take medication as 2 tablets by mouth AM and 2 tablets PM daily and to continue unless otherwise directed by Dr. Derrick Fling. Common side effects and adverse reactions discussed with patient as well as contraindications. I instructed patient on the action to take in the event of a missed dose and/or potential side effects. Proper storage education was given as well as prescription refill information. Patient verbalized understanding and advised to call with any questions or concerns.         Visit Complexity - G2211 Chronic management   ASSESSMENT:      ICD-10 Details  1 GU:   Prostate Cancer - C61 Chronic, Stable - I had a long discussion with the patient and his wife on his PSA results, path report, staging and prognosis. We went over the nature risks and benefits of ADT as well as AR PI. We discussed 1 benefit of adding curative radiation is that hopefully he can stop these medicines in a couple of years. We went over the role of radiation. Discussed fiducials and SpaceOAR.  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  3   Urinary Retention - R33.8 Chronic, Stable  4   BPH w/LUTS - N40.1 Chronic, Stable - We went over the anatomy and discussed the nature risk benefits and alternatives to aqua ablation. We discussed preserving ejaculation, but he would like to maximize chance of voiding. Discussed retreatment rate after prostate procedures. bleeding, infection, stricture, and incontinence discussed among others. He is not clinically infected but given upcoming procedure we will start him back on cephalexin   based on his prior culture.n   PLAN:  Medications New Meds: Cephalexin  500 MG Capsule 1 capsule PO BID   #28  0 Refill(s)  Pharmacy Name:  [id number]  Address:  [address]   [place], [place] [id number]  Phone:  [phone]  Fax:  [phone]            Schedule Labs: 3 Months - Total Testosterone     3 Months - PSA  Return Visit/Planned Activity: 3 Months - APC Clinic             Note: 30 min APC          Document Letter(s):  Created for Patient: Clinical Summary         Notes:   CC: Dr. Neomi Banks        Next Appointment:      Next Appointment: 07/26/2023 12:15 PM    Appointment Type: Surgery     Location: [facility] [phone]    Provider: Christina Coyer, M.D.    Reason for Visit: OP--[facility]--AQUABLATION      * Signed by Christina Coyer, M.D. on 07/20/23 at 4:36 PM (EDT)*      The information contained in this medical record document is considered private and confidential patient information. This information can only be used for the medical diagnosis and/or medical services that are being provided by the patient's selected caregivers. This information can only be distributed outside of the patient's care if the patient agrees and signs waivers of authorization for this information to be sent to an outside source or route.

## 2023-07-21 ENCOUNTER — Encounter: Payer: Self-pay | Admitting: General Surgery

## 2023-07-21 ENCOUNTER — Telehealth: Payer: Self-pay | Admitting: Gastroenterology

## 2023-07-21 ENCOUNTER — Encounter: Payer: Self-pay | Admitting: Internal Medicine

## 2023-07-21 ENCOUNTER — Other Ambulatory Visit: Payer: Self-pay | Admitting: General Surgery

## 2023-07-21 DIAGNOSIS — E041 Nontoxic single thyroid nodule: Secondary | ICD-10-CM

## 2023-07-21 NOTE — Telephone Encounter (Signed)
 Patient not seen in 3 years. The opening with our APPs are in June. Please advise.

## 2023-07-21 NOTE — Telephone Encounter (Signed)
 Patient called and stated that he was diagnosed with prostate cancer and it has metastasized to other part of his body and was hoping to see if it was possible to get rescheduled. Patient stated that he starts treatment in June 1 all the way into August. In the month of may he is going to have surgery to remove his prostate. Patient stated that he only has 1 bottle of prevacid  and is needing a refill. Patient stated that he is wanting to speak to some regarding this to see if his still has to come I and get a colonoscopy as well. Please advise.

## 2023-07-22 ENCOUNTER — Ambulatory Visit
Admission: RE | Admit: 2023-07-22 | Discharge: 2023-07-22 | Disposition: A | Discharge status: Home / Self Care | Source: Ambulatory Visit | Attending: General Surgery | Admitting: General Surgery

## 2023-07-22 DIAGNOSIS — E042 Nontoxic multinodular goiter: Secondary | ICD-10-CM | POA: Diagnosis not present

## 2023-07-22 DIAGNOSIS — E041 Nontoxic single thyroid nodule: Secondary | ICD-10-CM

## 2023-07-23 NOTE — Patient Instructions (Addendum)
 SURGICAL WAITING ROOM VISITATION Patients having surgery or a procedure may have no more than 2 support people in the waiting area - these visitors may rotate in the visitor waiting room.   If the patient needs to stay at the hospital during part of their recovery, the visitor guidelines for inpatient rooms apply.  PRE-OP VISITATION  Pre-op nurse will coordinate an appropriate time for 1 support person to accompany the patient in pre-op.  This support person may not rotate.  This visitor will be contacted when the time is appropriate for the visitor to come back in the pre-op area.  Please refer to the [facility] website for the visitor guidelines for Inpatients (after your surgery is over and you are in a regular room).  You are not required to quarantine at this time prior to your surgery. However, you must do this: Hand Hygiene often Do NOT share personal items Notify your provider if you are in close contact with someone who has COVID or you develop fever 100.4 or greater, new onset of sneezing, cough, sore throat, shortness of breath or body aches.  If you test positive for Covid or have been in contact with anyone that has tested positive in the last 10 days please notify you surgeon.    Your procedure is scheduled on:  TUESDAY  July 26, 2023  Report to [facility] Main Entrance: [address] entrance where the [employer] is available.   Report to admitting at:  09:45   AM  Call this number if you have any questions or problems the morning of surgery [phone]  DO NOT EAT OR DRINK ANYTHING AFTER MIDNIGHT THE NIGHT PRIOR TO YOUR SURGERY / PROCEDURE.   FOLLOW  ANY ADDITIONAL PRE OP INSTRUCTIONS YOU RECEIVED FROM YOUR SURGEON'S OFFICE!!!  FLEET ENEMA: Obtain one(1) Fleet Enema (sodium phosphate 7-19 gm / 118 ml enema) and use (according to the directions on the box) the night prior to your surgery.   If you have any questions, please contact your Surgeon's office for  additional information.    Oral Hygiene is also important to reduce your risk of infection.        Remember - BRUSH YOUR TEETH THE MORNING OF SURGERY WITH YOUR REGULAR TOOTHPASTE  Do NOT smoke after Midnight the night before surgery.  STOP TAKING all Vitamins, Herbs and supplements 1 week before your surgery.   Take ONLY these medicines the morning of surgery with A SIP OF WATER:   Lansoprazole , tamsulosin, amlodipine , Paroxetine , and you may take Clonazepam  if needed, You may use your Afrin Nasal spray and Pulmicort  nebulizer if needed.   If You have been diagnosed with Sleep Apnea - Bring CPAP mask and tubing day of surgery. We will provide you with a CPAP machine on the day of your surgery.                   You may not have any metal on your body including  jewelry, and body piercing  Do not wear lotions, powders, cologne, or deodorant  Men may shave face and neck.  Contacts, Hearing Aids, dentures or bridgework may not be worn into surgery. DENTURES WILL BE REMOVED PRIOR TO SURGERY PLEASE DO NOT APPLY "Poly grip" OR ADHESIVES!!!  Patients discharged on the day of surgery will not be allowed to drive home.  Someone NEEDS to stay with you for the first 24 hours after anesthesia.  Do not bring your home medications to the hospital. The  Pharmacy will dispense medications listed on your medication list to you during your admission in the Hospital.  Please read over the following fact sheets you were given: IF YOU HAVE QUESTIONS ABOUT YOUR PRE-OP INSTRUCTIONS, PLEASE CALL [phone].   [place] - Preparing for Surgery Before surgery, you can play an important role.  Because skin is not sterile, your skin needs to be as free of germs as possible.  You can reduce the number of germs on your skin by washing with CHG (chlorahexidine gluconate) soap before surgery.  CHG is an antiseptic cleaner which kills germs and bonds with the skin to continue killing germs even after washing. Please  DO NOT use if you have an allergy  to CHG or antibacterial soaps.  If your skin becomes reddened/irritated stop using the CHG and inform your nurse when you arrive at Short Stay. Do not shave (including legs and underarms) for at least 48 hours prior to the first CHG shower.  You may shave your face/neck.  Please follow these instructions carefully:  1.  Shower with CHG Soap the night before surgery and the  morning of surgery.  2.  If you choose to wash your hair, wash your hair first as usual with your normal  shampoo.  3.  After you shampoo, rinse your hair and body thoroughly to remove the shampoo.                             4.  Use CHG as you would any other liquid soap.  You can apply chg directly to the skin and wash.  Gently with a scrungie or clean washcloth.  5.  Apply the CHG Soap to your body ONLY FROM THE NECK DOWN.   Do not use on face/ open                           Wound or open sores. Avoid contact with eyes, ears mouth and genitals (private parts).                       Wash face,  Genitals (private parts) with your normal soap.             6.  Wash thoroughly, paying special attention to the area where your  surgery  will be performed.  7.  Thoroughly rinse your body with warm water from the neck down.  8.  DO NOT shower/wash with your normal soap after using and rinsing off the CHG Soap.            9.  Pat yourself dry with a clean towel.            10.  Wear clean pajamas.            11.  Place clean sheets on your bed the night of your first shower and do not  sleep with pets.  ON THE DAY OF SURGERY : Do not apply any lotions/deodorants the morning of surgery.  Please wear clean clothes to the hospital/surgery center.    FAILURE TO FOLLOW THESE INSTRUCTIONS MAY RESULT IN THE CANCELLATION OF YOUR SURGERY  PATIENT SIGNATURE_________________________________  NURSE  SIGNATURE__________________________________  ________________________________________________________________________

## 2023-07-23 NOTE — Progress Notes (Signed)
 COVID Vaccine received:  []  No [x]  Yes Date of any COVID positive Test in last 90 days:  PCP - Devonna Foley, MD  Cardiologist -  Pulmomary- Wilder Handy, MD  Chest x-ray - 06-16-2022  2v  Epic EKG -08-21-2020 Epic  will repeat   Stress Test -  ECHO - 01-29-2021  [facility] Cardiac Cath -   Bowel Prep - []  No  [x]   Yes __1 fleet enema_night prior  Pacemaker / ICD device [x]  No []  Yes   Spinal Cord Stimulator:[x]  No []  Yes       History of Sleep Apnea? []  No [x]  Yes   CPAP used?- []  No [x]  Yes    Does the patient monitor blood sugar?   []  N/A   []  No []  Yes  Patient has: []  NO Hx DM   [x]  Pre-DM   []  DM1  []   DM2 Last A1c was:  7.0 on   02-10-2023   Does patient have a [employer] or Dexcom? []  No []  Yes   Fasting Blood Sugar Ranges-  Checks Blood Sugar _____ times a day TRULICITY- on hold for surgery  Blood Thinner / Instructions:none Aspirin  Instructions:  ASA 81mg  already on hold  ERAS Protocol Ordered: [x]  No  []  Yes Patient is to be NPO after: midnight prior  Dental hx: []  Dentures:  []  N/A      []  Bridge or Partial:                   []  Loose or Damaged teeth:   Comments:   Activity level: Patient is able / unable to climb a flight of stairs without difficulty; []  No CP  []  No SOB, but would have ___   Patient can / can not perform ADLs without assistance.   Anesthesia review: HTN, GERD, Pre-DM, OSA-CPAP  Patient denies shortness of breath, fever, cough and chest pain at PAT appointment.  Patient verbalized understanding and agreement to the Pre-Surgical Instructions that were given to them at this PAT appointment. Patient was also educated of the need to review these PAT instructions again prior to his surgery.I reviewed the appropriate phone numbers to call if they have any and questions or concerns.

## 2023-07-25 ENCOUNTER — Encounter (HOSPITAL_COMMUNITY)
Admission: RE | Admit: 2023-07-25 | Discharge: 2023-07-25 | Disposition: A | Discharge status: Home / Self Care | Source: Ambulatory Visit | Attending: Urology | Admitting: Urology

## 2023-07-25 ENCOUNTER — Other Ambulatory Visit: Payer: Self-pay

## 2023-07-25 ENCOUNTER — Encounter (HOSPITAL_COMMUNITY): Payer: Self-pay

## 2023-07-25 VITALS — BP 120/70 | HR 78 | Temp 98.2°F | Resp 16 | Ht 73.0 in | Wt 252.0 lb

## 2023-07-25 DIAGNOSIS — Z9221 Personal history of antineoplastic chemotherapy: Secondary | ICD-10-CM | POA: Diagnosis not present

## 2023-07-25 DIAGNOSIS — Z01818 Encounter for other preprocedural examination: Secondary | ICD-10-CM | POA: Diagnosis not present

## 2023-07-25 DIAGNOSIS — I10 Essential (primary) hypertension: Secondary | ICD-10-CM | POA: Diagnosis not present

## 2023-07-25 HISTORY — DX: Hypothyroidism, unspecified: E03.9

## 2023-07-25 HISTORY — DX: Malignant (primary) neoplasm, unspecified: C80.1

## 2023-07-25 HISTORY — DX: Nontoxic single thyroid nodule: E04.1

## 2023-07-25 HISTORY — DX: Cardiac murmur, unspecified: R01.1

## 2023-07-25 LAB — COMPREHENSIVE METABOLIC PANEL WITH GFR
ALT: 14 U/L (ref 0–44)
AST: 19 U/L (ref 15–41)
Albumin: 3.7 g/dL (ref 3.5–5.0)
Alkaline Phosphatase: 58 U/L (ref 38–126)
Anion gap: 9 (ref 5–15)
BUN: 19 mg/dL (ref 8–23)
CO2: 25 mmol/L (ref 22–32)
Calcium: 9.3 mg/dL (ref 8.9–10.3)
Chloride: 106 mmol/L (ref 98–111)
Creatinine, Ser: 1.18 mg/dL (ref 0.61–1.24)
GFR, Estimated: >60 mL/min (ref >60)
Glucose, Bld: 136 mg/dL — ABNORMAL HIGH (ref 70–99)
Potassium: 4 mmol/L (ref 3.5–5.1)
Sodium: 140 mmol/L (ref 135–145)
Total Bilirubin: 0.9 mg/dL (ref 0.0–1.2)
Total Protein: 6.6 g/dL (ref 6.5–8.1)

## 2023-07-25 LAB — CBC
HCT: 42.4 % (ref 39.0–52.0)
Hemoglobin: 13.9 g/dL (ref 13.0–17.0)
MCH: 30.4 pg (ref 26.0–34.0)
MCHC: 32.8 g/dL (ref 30.0–36.0)
MCV: 92.8 fL (ref 80.0–100.0)
Platelets: 234 10*3/uL (ref 150–400)
RBC: 4.57 MIL/uL (ref 4.22–5.81)
RDW: 13 % (ref 11.5–15.5)
WBC: 6.2 10*3/uL (ref 4.0–10.5)
nRBC: 0 % (ref 0.0–0.2)

## 2023-07-26 ENCOUNTER — Ambulatory Visit (HOSPITAL_COMMUNITY): Admission: RE | Admit: 2023-07-26 | Source: Home / Self Care | Admitting: Urology

## 2023-07-26 ENCOUNTER — Encounter (HOSPITAL_COMMUNITY): Payer: Self-pay | Admitting: Physician Assistant

## 2023-07-26 ENCOUNTER — Encounter (HOSPITAL_COMMUNITY): Admission: RE | Payer: Self-pay | Source: Home / Self Care

## 2023-07-26 ENCOUNTER — Other Ambulatory Visit: Payer: Self-pay

## 2023-07-26 ENCOUNTER — Other Ambulatory Visit: Payer: Self-pay | Admitting: General Surgery

## 2023-07-26 ENCOUNTER — Emergency Department (HOSPITAL_BASED_OUTPATIENT_CLINIC_OR_DEPARTMENT_OTHER)
Admission: EM | Admit: 2023-07-26 | Discharge: 2023-07-26 | Discharge status: Absent without leave | Source: Home / Self Care

## 2023-07-26 DIAGNOSIS — E041 Nontoxic single thyroid nodule: Secondary | ICD-10-CM

## 2023-07-26 SURGERY — ABLATION, PROSTATE, TRANSURETHRAL, USING WATERJET
Anesthesia: General

## 2023-07-27 ENCOUNTER — Encounter: Payer: Self-pay | Admitting: Internal Medicine

## 2023-07-28 ENCOUNTER — Other Ambulatory Visit (HOSPITAL_COMMUNITY)
Admission: RE | Admit: 2023-07-28 | Discharge: 2023-07-28 | Disposition: A | Discharge status: Home / Self Care | Source: Ambulatory Visit | Attending: Interventional Radiology | Admitting: Interventional Radiology

## 2023-07-28 ENCOUNTER — Ambulatory Visit
Admission: RE | Admit: 2023-07-28 | Discharge: 2023-07-28 | Disposition: A | Discharge status: Home / Self Care | Source: Ambulatory Visit | Attending: General Surgery | Admitting: General Surgery

## 2023-07-28 ENCOUNTER — Other Ambulatory Visit

## 2023-07-28 DIAGNOSIS — E0789 Other specified disorders of thyroid: Secondary | ICD-10-CM | POA: Diagnosis not present

## 2023-07-28 DIAGNOSIS — E041 Nontoxic single thyroid nodule: Secondary | ICD-10-CM

## 2023-07-28 NOTE — Progress Notes (Signed)
 Patient was scheduled to undergo aquablation with Dr. Derrick Fling on 4/29, however, this had to be canceled due to episode of Afib.  Patient will be worked up by cardiology, and is scheduled on 5/12.    RN left message with patient updating that I will follow closely, and encouraged patient to reach out with any questions.

## 2023-07-29 ENCOUNTER — Ambulatory Visit: Payer: Medicare PPO | Admitting: Gastroenterology

## 2023-07-29 NOTE — Progress Notes (Deleted)
 Ronald King [id number] [birth year]   Chief Complaint: Discuss colonoscopy  Referring Provider: Ezell Hollow, MD Primary GI MD: Dr. Leonia Raman  HPI: Ronald King is a 78 y.o. male with significant past medical history of diabetes, HTN, dyslipidemia, GRED s/p Nissen fundoplication, heart murmur, OSA on CPAP.  He has recently been diagnosed with prostate cancer and had a PET scan showing metastases to the lumbar spine, hip, and lymph nodes as below. A right thyroid  nodule was also identified and he is being followed by [facility] for this and has had thyroid  US  and FNA Bx within the past week.  Per chart review, patient's prostate cancer treatment is on hold until he can see cardiology for new onset A.fib seen on EKG 07/25/2023.  He is here today to discuss colonoscopy to rule out metastasis to the colon.  PET scan 06/21/2023 IMPRESSION: 1. Unusual pattern of radiotracer activity with no specific activity identified within the primary prostate carcinoma in the LEFT lobe but with intense multifocal radiotracer avid skeletal metastasis. 2. 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 Four lesions identified. 3. Large LEFT prostate lobe mass is not specifically radiotracer avid. 4. LEFT external iliac lymph node has mild nonspecific radiotracer avid. 5. No evidence of visceral metastasis or pulmonary metastasis. 6. Radiotracer avid lesion in the RIGHT lobe of the thyroid  gland. Favor primary thyroid  lesion. Recommend thyroid  ultrasound and potential tissue sampling for further evaluation potential thyroid  carcinoma.  Echocardiogram 01/29/2021 - LVEF 55-60% - Mild aortic valve stenosis (AVA 1.29)  Pertinent medications: lansoprazole  30mg , aspirin  81mg , Trulicity  Update on thryoid? MI/stroke hx? Chest pain/short of breath? Oxygen use? Heart/lung problems? Diabetes controlled?    Previous GI Procedures    Colonoscopy 11/08/2018 - Two 1 to 2 mm polyps in the transverse colon and in the cecum, removed with a cold biopsy forceps. Resected and retrieved.  - Five 4 to 7 mm polyps in the transverse colon, in the ascending colon and in the cecum, removed with a cold snare. Resected and retrieved. - Diverticulosis in the sigmoid colon and in the descending colon.  - Non- bleeding internal hemorrhoids. - Recall 3 years  Surgical [P], colon, transverse, ascending and cecum, polyp (7) - TUBULAR ADENOMA(S) - NEGATIVE FOR HIGH-GRADE DYSPLASIA OR MALIGNANCY  EGD 02/23/2017 - Normal esophagus.  - A Nissen fundoplication was found. The wrap appears intact.  - Normal stomach.  - Normal examined duodenum.  - No specimens collected.  Past Medical History:  Diagnosis Date   Allergy     Anemia    Anxiety    Blood transfusion without reported diagnosis    Cancer (HCC)    prostate with mets to liver   Chronic bronchitis    Chronic cough    Diabetes (HCC) 07/2011   per Dr Kathyanne Parkers   DJD (degenerative joint disease)    Dyslipidemia    Esophageal polyp    GERD (gastroesophageal reflux disease)    s/p [employer] F.   Heart murmur    Valve stenosis,  ECHO 01-2021   Hemorrhoids, internal    History of gastrointestinal hemorrhage    Hypertension    Hypogonadism, male    per Dr Kathyanne Parkers   Hypothyroidism    Neuromuscular disorder (HCC)    neuropathy feet   Neuropathy    per neuro-Dr Regis Captain vicodin   OSA (obstructive sleep apnea)    on CPAP   Sleep apnea    CPAP at night  Thyroid  nodule    Vitamin D  deficiency     Past Surgical History:  Procedure Laterality Date   ANAL FISSURE REPAIR     hand fracture surgery     left   KNEE ARTHROSCOPY Left 2007   Dr. Rozelle Corning   LAPAROSCOPIC NISSEN FUNDOPLICATION  05/1999   Dr. Gaylyn Keas   NOSE SURGERY     PROSTATE BIOPSY     TONSILLECTOMY     UPPER GASTROINTESTINAL ENDOSCOPY      Current Outpatient Medications  Medication Sig Dispense Refill   amLODipine   (NORVASC ) 10 MG tablet Take 1 tablet (10 mg total) by mouth daily. 90 tablet 1   aspirin  EC 81 MG tablet Take 1 tablet (81 mg total) by mouth daily. Swallow whole.     Azelastine -Fluticasone  137-50 MCG/ACT SUSP Place 1 spray into the nose in the morning and at bedtime. (Patient not taking: Reported on 07/19/2023) 23 g 2   Bioflavonoid Products (VITAMIN C) CHEW Chew 1 tablet by mouth daily.     budesonide  (PULMICORT ) 0.5 MG/2ML nebulizer solution Take 2 mLs (0.5 mg total) by nebulization 2 (two) times daily as needed (shortness of breath, wheezing, cough). 75 mL 5   Calcium -Magnesium  (CAL-MAG PO) Take 1 capsule by mouth in the morning.     Cholecalciferol (VITAMIN D3 PO) Take 1,000 Units by mouth in the morning.     clonazePAM  (KLONOPIN ) 1 MG tablet TAKE 1 TABLET(1 MG) BY MOUTH TWICE DAILY (Patient taking differently: Take 1 mg by mouth 2 (two) times daily as needed for anxiety.) 60 tablet 2   Coenzyme Q10-Vitamin E (QUNOL ULTRA COQ10 PO) Take 1 capsule by mouth in the morning.     cyanocobalamin (VITAMIN B12) 1000 MCG tablet Take 1,000 mcg by mouth in the morning.     Dulaglutide (TRULICITY) 0.75 MG/0.5ML SOPN Inject 0.75 mg into the skin every Thursday.     fenofibrate  (TRICOR ) 145 MG tablet Take 1 tablet (145 mg total) by mouth daily. 90 tablet 1   lansoprazole  (PREVACID ) 30 MG capsule TAKE 1 CAPSULE(30 MG) BY MOUTH TWICE DAILY BEFORE A MEAL 120 capsule 0   Lysine 500 MG CAPS Take 500 mg by mouth in the morning.     Multiple Vitamin (MULTIVITAMIN WITH MINERALS) TABS tablet Take 1 tablet by mouth daily. Centrum Silver for Men 50+     NUBEQA 300 MG tablet Take 600 mg by mouth 2 (two) times daily.     oxymetazoline (AFRIN) 0.05 % nasal spray Place 1 spray into both nostrils 2 (two) times daily as needed for congestion.     PARoxetine  (PAXIL ) 20 MG tablet Take 1 tablet (20 mg total) by mouth daily. 90 tablet 1   pyridOXINE (VITAMIN B-6) 100 MG tablet Take 100 mg by mouth daily.     relugolix  (ORGOVYX) 120 MG tablet Take 120 mg by mouth daily at 12 noon. (1230p)     rosuvastatin  (CRESTOR ) 40 MG tablet Take 1 tablet (40 mg total) by mouth at bedtime. 90 tablet 1   tamsulosin (FLOMAX) 0.4 MG CAPS capsule Take 0.4 mg by mouth in the morning and at bedtime.     No current facility-administered medications for this visit.    Allergies as of 07/29/2023 - Review Complete 07/19/2023  Allergen Reaction Noted   Montelukast Shortness Of Breath 12/10/2015   Ceftin [cefuroxime axetil] Other (See Comments) 02/10/2017   Duloxetine  12/12/2015   Lyrica [pregabalin] Other (See Comments) 12/10/2015   Metformin hcl Other (See Comments) 07/23/2021  Neurontin [gabapentin] Other (See Comments) 12/10/2015   Testosterone  Other (See Comments) 07/23/2021    Family History  Problem Relation Age of Onset   Cerebral aneurysm Father    Alcohol abuse Father    Neuropathy Mother    Heart disease Mother        died at age 27   Migraines Brother        several   Diabetes Maternal Uncle    CAD Brother        CABG in his 64s   Colon cancer Neg Hx    Prostate cancer Neg Hx    Esophageal cancer Neg Hx    Stomach cancer Neg Hx    Rectal cancer Neg Hx     Social History   Tobacco Use   Smoking status: Former    Current packs/day: 0.00    Average packs/day: 1 pack/day for 3.0 years (3.0 ttl pk-yrs)    Types: Cigarettes    Start date: 03/29/1962    Quit date: 03/29/1965    Years since quitting: 58.3   Smokeless tobacco: Never  Vaping Use   Vaping status: Never Used  Substance Use Topics   Alcohol use: No   Drug use: No     Review of Systems:    Constitutional: No weight loss, fever, chills, weakness or fatigue HEENT: Eyes: No change in vision Ears, Nose, Throat:  No change in hearing or congestion Skin: No rash or itching Cardiovascular: No chest pain, chest pressure or palpitations   Respiratory: No SOB or cough Gastrointestinal: See HPI and otherwise negative Genitourinary: No dysuria  or change in urinary frequency Neurological: No headache, dizziness or syncope Musculoskeletal: No new muscle or joint pain Hematologic: No bleeding or bruising Psychiatric: No history of depression or anxiety    Physical Exam:  Vital signs: There were no vitals taken for this visit.  Constitutional: NAD, Well developed, Well nourished, alert and cooperative Head:  Normocephalic and atraumatic. Eyes:  EOMs intact. No scleral icterus. Conjunctiva pink. Respiratory: Respirations even and unlabored. Lungs clear to auscultation bilaterally.  No wheezes, crackles, or rhonchi.  Cardiovascular:  Regular rate and rhythm. No peripheral edema, cyanosis or pallor.  Gastrointestinal:  Soft, nondistended, nontender. No rebound or guarding. Normal bowel sounds. No appreciable masses or hepatomegaly. Rectal:  Not performed.  Msk:  Symmetrical without gross deformities. Without edema, no deformity or joint abnormality.  Neurologic:  Alert and oriented x4;  grossly normal neurologically.  Skin:   Dry and intact without significant lesions or rashes. Psychiatric: Oriented to person, place and time. Demonstrates good judgement and reason without abnormal affect or behaviors.   RELEVANT LABS AND IMAGING: CBC    Component Value Date/Time   WBC 6.2 07/25/2023 1100   RBC 4.57 07/25/2023 1100   HGB 13.9 07/25/2023 1100   HCT 42.4 07/25/2023 1100   PLT 234 07/25/2023 1100   MCV 92.8 07/25/2023 1100   MCH 30.4 07/25/2023 1100   MCHC 32.8 07/25/2023 1100   RDW 13.0 07/25/2023 1100   LYMPHSABS 1.2 01/03/2023 1130   MONOABS 0.7 01/03/2023 1130   EOSABS 0.1 01/03/2023 1130   BASOSABS 0.0 01/03/2023 1130    CMP     Component Value Date/Time   NA 140 07/25/2023 1100   NA 142 04/27/2018 0000   K 4.0 07/25/2023 1100   CL 106 07/25/2023 1100   CO2 25 07/25/2023 1100   GLUCOSE 136 (H) 07/25/2023 1100   GLUCOSE 98 03/07/2006 1711   BUN 19 07/25/2023  1100   BUN 15 04/27/2018 0000   CREATININE 1.18  07/25/2023 1100   CREATININE 1.23 (H) 01/03/2020 0946   CALCIUM  9.3 07/25/2023 1100   PROT 6.6 07/25/2023 1100   ALBUMIN 3.7 07/25/2023 1100   AST 19 07/25/2023 1100   ALT 14 07/25/2023 1100   ALKPHOS 58 07/25/2023 1100   BILITOT 0.9 07/25/2023 1100   GFRNONAA >60 07/25/2023 1100   GFRAA 110 04/17/2008 1131     Assessment/Plan:   Prostate cancer with metastatic disease on PET scan  - Colonoscopy to rule out mets to the colon  - Add EGD for upper GI symptoms - Will need clearance from cardiology - Will need to hold Trulicity    Valiant Gaul, PA-C [facility] 07/29/2023, 8:37 AM  Patient Care Team: Ezell Hollow, MD as PCP - General (Internal Medicine) Gordy Lauber, MD as Consulting Physician (Endocrinology) Lorraine Roses, MD as Consulting Physician (Pulmonary Disease) Tally Faes., MD as Consulting Physician (Neurology) Bart Born, MD (Inactive) as Consulting Physician (Urology) Keenan Pastor (Optometry) Katheleen Palmer, RN as Oncology Nurse Navigator

## 2023-08-01 LAB — CYTOLOGY - NON PAP

## 2023-08-02 ENCOUNTER — Inpatient Hospital Stay: Admission: RE | Admit: 2023-08-02 | Source: Ambulatory Visit

## 2023-08-02 ENCOUNTER — Other Ambulatory Visit

## 2023-08-02 ENCOUNTER — Telehealth: Payer: Self-pay

## 2023-08-02 NOTE — Progress Notes (Unsigned)
 Referring-Ronald Paz, MD Reason for referral-preoperative evaluation and atrial fibrillation  HPI: 78 year old male for evaluation of atrial fibrillation and preop at request of Glen Land, MD.  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  Echocardiogram November 2022 showed normal LV function, mild left ventricular hypertrophy, mild left atrial enlargement, mild aortic stenosis with mean gradient 10 mmHg.  Current Outpatient Medications  Medication Sig Dispense Refill   amLODipine  (NORVASC ) 10 MG tablet Take 1 tablet (10 mg total) by mouth daily. 90 tablet 1   aspirin  EC 81 MG tablet Take 1 tablet (81 mg total) by mouth daily. Swallow whole.     Azelastine -Fluticasone  137-50 MCG/ACT SUSP Place 1 spray into the nose in the morning and at bedtime. (Patient not taking: Reported on 07/19/2023) 23 g 2   Bioflavonoid Products (VITAMIN C) CHEW Chew 1 tablet by mouth daily.     budesonide  (PULMICORT ) 0.5 MG/2ML nebulizer solution Take 2 mLs (0.5 mg total) by nebulization 2 (two) times daily as needed (shortness of breath, wheezing, cough). 75 mL 5   Calcium -Magnesium  (CAL-MAG PO) Take 1 capsule by mouth in the morning.     Cholecalciferol (VITAMIN D3 PO) Take 1,000 Units by mouth in the morning.     clonazePAM  (KLONOPIN ) 1 MG tablet TAKE 1 TABLET(1 MG) BY MOUTH TWICE DAILY (Patient taking differently: Take 1 mg by mouth 2 (two) times daily as needed for anxiety.) 60 tablet 2   Coenzyme Q10-Vitamin E (QUNOL ULTRA COQ10 PO) Take 1 capsule by mouth in the morning.     cyanocobalamin (VITAMIN B12) 1000 MCG tablet Take 1,000 mcg by mouth in the morning.     Dulaglutide (TRULICITY) 0.75 MG/0.5ML SOPN Inject 0.75 mg into the skin every Thursday.     fenofibrate  (TRICOR ) 145 MG tablet Take 1 tablet (145 mg  total) by mouth daily. 90 tablet 1   lansoprazole  (PREVACID ) 30 MG capsule TAKE 1 CAPSULE(30 MG) BY MOUTH TWICE DAILY BEFORE A MEAL 120 capsule 0   Lysine 500 MG CAPS Take 500 mg by mouth in the morning.     Multiple Vitamin (MULTIVITAMIN WITH MINERALS) TABS tablet Take 1 tablet by mouth daily. Centrum Silver for Men 50+     NUBEQA 300 MG tablet Take 600 mg by mouth 2 (two) times daily.     oxymetazoline (AFRIN) 0.05 % nasal spray Place 1 spray into both nostrils 2 (two) times daily as needed for congestion.     PARoxetine  (PAXIL ) 20 MG tablet Take 1 tablet (20 mg total) by mouth daily. 90 tablet 1   pyridOXINE (VITAMIN B-6) 100 MG tablet Take 100 mg by mouth daily.     relugolix (ORGOVYX) 120 MG tablet Take 120 mg by mouth daily at 12 noon. (1230p)     rosuvastatin  (CRESTOR ) 40 MG tablet Take 1 tablet (40 mg total) by mouth at bedtime. 90 tablet 1   tamsulosin (FLOMAX) 0.4 MG CAPS capsule Take 0.4 mg by mouth in the morning and at bedtime.     No current facility-administered medications for this visit.    Allergies  Allergen Reactions   Montelukast Shortness Of Breath   Ceftin [Cefuroxime Axetil] Other (See Comments)    Unknown reaction    Duloxetine     Other reaction(s): Other (See Comments) refuses  Lyrica [Pregabalin] Other (See Comments)    Drowsiness   Metformin Hcl Other (See Comments)    Unknown reaction   Neurontin [Gabapentin] Other (See Comments)    Drowsiness /"affected memory"   Testosterone  Other (See Comments)    HTN     Past Medical History:  Diagnosis Date   Allergy     Anemia    Anxiety    Blood transfusion without reported diagnosis    Cancer (HCC)    prostate with mets to liver   Chronic bronchitis    Chronic cough    Diabetes (HCC) 07/2011   per Dr Kathyanne Parkers   DJD (degenerative joint disease)    Dyslipidemia    Esophageal polyp    GERD (gastroesophageal reflux disease)    s/p [employer] F.   Heart murmur    Valve stenosis,  ECHO 01-2021    Hemorrhoids, internal    History of gastrointestinal hemorrhage    Hypertension    Hypogonadism, male    per Dr Kathyanne Parkers   Hypothyroidism    Neuromuscular disorder (HCC)    neuropathy feet   Neuropathy    per neuro-Dr Regis Captain vicodin   OSA (obstructive sleep apnea)    on CPAP   Sleep apnea    CPAP at night   Thyroid  nodule    Vitamin D  deficiency     Past Surgical History:  Procedure Laterality Date   ANAL FISSURE REPAIR     hand fracture surgery     left   KNEE ARTHROSCOPY Left 2007   Dr. Rozelle Corning   LAPAROSCOPIC NISSEN FUNDOPLICATION  05/1999   Dr. Gaylyn Keas   NOSE SURGERY     PROSTATE BIOPSY     TONSILLECTOMY     UPPER GASTROINTESTINAL ENDOSCOPY      Social History   Socioeconomic History   Marital status: Married    Spouse name: Not on file   Number of children: 1   Years of education: Not on file   Highest education level: Not on file  Occupational History   Occupation: Regulatory affairs officer, retired    Comment: retired   Occupation: minister    Comment: [employer]  Tobacco Use   Smoking status: Former    Current packs/day: 0.00    Average packs/day: 1 pack/day for 3.0 years (3.0 ttl pk-yrs)    Types: Cigarettes    Start date: 03/29/1962    Quit date: 03/29/1965    Years since quitting: 58.3   Smokeless tobacco: Never  Vaping Use   Vaping status: Never Used  Substance and Sexual Activity   Alcohol use: No   Drug use: No   Sexual activity: Not Currently  Other Topics Concern   Not on file  Social History Narrative   Lives w/ wife   Social Drivers of Health   Financial Resource Strain: Low Risk  (04/30/2021)   Overall Financial Resource Strain (CARDIA)    Difficulty of Paying Living Expenses: Not hard at all  Food Insecurity: No Food Insecurity (07/14/2023)   Hunger Vital Sign    Worried About Running Out of Food in the Last Year: Never true    Ran Out of Food in the Last Year: Never true  Transportation Needs: No Transportation Needs  (07/14/2023)   PRAPARE - Administrator, Civil Service (Medical): No    Lack of Transportation (Non-Medical): No  Physical Activity: Sufficiently Active (04/30/2021)   Exercise Vital Sign    Days of Exercise per Week: 7 days  Minutes of Exercise per Session: 30 min  Stress: No Stress Concern Present (04/30/2021)   [employer] of Occupational Health - Occupational Stress Questionnaire    Feeling of Stress : Not at all  Social Connections: Moderately Integrated (04/30/2021)   Social Connection and Isolation Panel [NHANES]    Frequency of Communication with Friends and Family: More than three times a week    Frequency of Social Gatherings with Friends and Family: More than three times a week    Attends Religious Services: More than 4 times per year    Active Member of [employer] or Organizations: No    Attends Banker Meetings: Never    Marital Status: Married  Catering manager Violence: Not At Risk (07/14/2023)   Humiliation, Afraid, Rape, and Kick questionnaire    Fear of Current or Ex-Partner: No    Emotionally Abused: No    Physically Abused: No    Sexually Abused: No    Family History  Problem Relation Age of Onset   Cerebral aneurysm Father    Alcohol abuse Father    Neuropathy Mother    Heart disease Mother        died at age 27   Migraines Brother        several   Diabetes Maternal Uncle    CAD Brother        CABG in his 54s   Colon cancer Neg Hx    Prostate cancer Neg Hx    Esophageal cancer Neg Hx    Stomach cancer Neg Hx    Rectal cancer Neg Hx     ROS: no fevers or chills, productive cough, hemoptysis, dysphasia, odynophagia, melena, hematochezia, dysuria, hematuria, rash, seizure activity, orthopnea, PND, pedal edema, claudication. Remaining systems are negative.  Physical Exam:   There were no vitals taken for this visit.  General:  Well developed/well nourished in NAD Skin warm/dry Patient not depressed No peripheral  clubbing Back-normal HEENT-normal/normal eyelids Neck supple/normal carotid upstroke bilaterally; no bruits; no JVD; no thyromegaly chest - CTA/ normal expansion CV - RRR/normal S1 and S2; no murmurs, rubs or gallops;  PMI nondisplaced Abdomen -NT/ND, no HSM, no mass, + bowel sounds, no bruit 2+ femoral pulses, no bruits Ext-no edema, chords, 2+ DP Neuro-grossly nonfocal  ECG -July 25, 2023-atrial fibrillation.  Personally reviewed  A/P  1 newly noticed atrial fibrillation-check TSH and echocardiogram.  CHA2DS2-VASc is 4.  2 history of aortic stenosis-mild on previous echocardiogram in 2022.  Plan follow-up study.  3 preoperative evaluation-  4 hypertension-  5 History of sleep apnea-continue CPAP.  Alexandria Angel, MD

## 2023-08-03 ENCOUNTER — Ambulatory Visit: Attending: Cardiology | Admitting: Cardiology

## 2023-08-03 ENCOUNTER — Encounter: Payer: Self-pay | Admitting: Cardiology

## 2023-08-03 ENCOUNTER — Other Ambulatory Visit: Payer: Self-pay | Admitting: Urology

## 2023-08-03 VITALS — BP 120/78 | HR 82 | Ht 73.0 in | Wt 255.0 lb

## 2023-08-03 DIAGNOSIS — Z01810 Encounter for preprocedural cardiovascular examination: Secondary | ICD-10-CM | POA: Diagnosis not present

## 2023-08-03 DIAGNOSIS — C61 Malignant neoplasm of prostate: Secondary | ICD-10-CM

## 2023-08-03 DIAGNOSIS — I10 Essential (primary) hypertension: Secondary | ICD-10-CM | POA: Diagnosis not present

## 2023-08-03 DIAGNOSIS — I350 Nonrheumatic aortic (valve) stenosis: Secondary | ICD-10-CM | POA: Diagnosis not present

## 2023-08-03 DIAGNOSIS — I4819 Other persistent atrial fibrillation: Secondary | ICD-10-CM | POA: Diagnosis not present

## 2023-08-03 NOTE — Telephone Encounter (Signed)
 I am not sure what or what was not sent to preop APP yesterday. I looked in the chart and I see that the pt was going to have US  guided Thyroid  Bx, but pt cancelled this yesterday from what I can see in the chart.    I have read Dr. Annah Khat notes from today that he notes:  preoperative evaluation-patient is scheduled for thyroidectomy as well as prostate surgery.  He has excellent functional capacity and can walk 3 miles without having chest pain or dyspnea.  If his echocardiogram does not show severe aortic stenosis he may proceed without further evaluation.   I am not seeing that we received any clearance request for either procedure listed in Dr. Lourdes Roy ov note.   I can see in Epic and procedure with Dr. Doy Gene 11/11/23, though we have nit received a request yet for this. As far as the Thyroid  Bx I also do not see that we received a request for clearance. I will send this note to preop APP as FYI from the ov notes from Dr. Audery Blazing.

## 2023-08-03 NOTE — Patient Instructions (Addendum)
   Testing/Procedures:Your physician has requested that you have an echocardiogram. Echocardiography is a painless test that uses sound waves to create images of your heart. It provides your doctor with information about the size and shape of your heart and how well your heart's chambers and valves are working. This procedure takes approximately one hour. There are no restrictions for this procedure. Please do NOT wear cologne, perfume, aftershave, or lotions (deodorant is allowed). Please arrive 15 minutes prior to your appointment time.  Please note: We ask at that you not bring children with you during ultrasound (echo/ vascular) testing. Due to room size and safety concerns, children are not allowed in the ultrasound rooms during exams. Our front office staff cannot provide observation of children in our lobby area while testing is being conducted. An adult accompanying a patient to their appointment will only be allowed in the ultrasound room at the discretion of the ultrasound technician under special circumstances. We apologize for any inconvenience.    Follow-Up: At [facility], you and your health needs are our priority.  As part of our continuing mission to provide you with exceptional heart care, our providers are all part of one team.  This team includes your primary Cardiologist (physician) and Advanced Practice Providers or APPs (Physician Assistants and Nurse Practitioners) who all work together to provide you with the care you need, when you need it.  Your next appointment:   3 month(s)  Provider:   Alexandria Angel, MD

## 2023-08-04 NOTE — Telephone Encounter (Signed)
 S/w Jenette Mitchell at Dr. Davonna Estes office and inquired if the pt is supposed to be having a thyroid  Bx with Dr, Davonna Estes. Per Jenette Mitchell triage nurse Dr. Davonna Estes is actually wanting to do thyroid  surgery. I stated that I have not received a clearance request for this yet. Jenette Mitchell states she will fax to me right now at [phone] attn: preop team. Once we have the request we will get into the preop pool for review.

## 2023-08-04 NOTE — Telephone Encounter (Signed)
 It looks like the general surgeon doing the biopsy is Dr. Davonna Estes. Maybe reach out to that office to see if they are planning on sending in an official request?  Thanks!  DW

## 2023-08-04 NOTE — Telephone Encounter (Signed)
   Pre-operative Risk Assessment    Patient Name: Ronald King  DOB: 12-Mar-1946 MRN: [id number]   Date of last office visit: 08/03/2023 with Dr. Audery Blazing  Date of next office visit: 11/09/23 with Dr. Audery Blazing   Request for Surgical Clearance    Procedure:  Thyroid  Surgery  Date of Surgery:  Clearance TBD                                Surgeon:  Dr. Armond Bertin Surgeon's Group or Practice Name:  [facility] Phone number:  [phone] Fax number:  [phone] Attn: Eduardo Grade   Type of Clearance Requested:   - Medical  - Pharmacy:  Hold Aspirin  not indicated   Type of Anesthesia:  General    Additional requests/questions:    SignedSudie Ely   08/04/2023, 2:37 PM

## 2023-08-08 ENCOUNTER — Ambulatory Visit (HOSPITAL_BASED_OUTPATIENT_CLINIC_OR_DEPARTMENT_OTHER)
Admission: RE | Admit: 2023-08-08 | Discharge: 2023-08-08 | Disposition: A | Discharge status: Home / Self Care | Source: Ambulatory Visit | Attending: Internal Medicine | Admitting: Internal Medicine

## 2023-08-08 ENCOUNTER — Other Ambulatory Visit: Payer: Self-pay | Admitting: Urology

## 2023-08-08 ENCOUNTER — Ambulatory Visit: Admitting: Cardiology

## 2023-08-08 DIAGNOSIS — I4819 Other persistent atrial fibrillation: Secondary | ICD-10-CM | POA: Diagnosis not present

## 2023-08-09 ENCOUNTER — Ambulatory Visit: Payer: Self-pay | Admitting: Cardiology

## 2023-08-09 DIAGNOSIS — C61 Malignant neoplasm of prostate: Secondary | ICD-10-CM | POA: Diagnosis not present

## 2023-08-09 LAB — ECHOCARDIOGRAM COMPLETE
AR max vel: 1.53 cm2
AV Area VTI: 1.45 cm2
AV Area mean vel: 1.53 cm2
AV Mean grad: 16.5 mmHg
AV Peak grad: 27.7 mmHg
Ao pk vel: 2.63 m/s
Area-P 1/2: 4.03 cm2
Calc EF: 66.9 %
S' Lateral: 2.6 cm
Single Plane A2C EF: 67.7 %
Single Plane A4C EF: 63.5 %

## 2023-08-10 ENCOUNTER — Other Ambulatory Visit: Payer: Self-pay | Admitting: *Deleted

## 2023-08-10 DIAGNOSIS — I350 Nonrheumatic aortic (valve) stenosis: Secondary | ICD-10-CM

## 2023-08-10 NOTE — Telephone Encounter (Signed)
   Patient Name: Ronald King  DOB: 1946/03/01 MRN: [id number]  Primary Cardiologist: None  Chart reviewed as part of pre-operative protocol coverage.  He was last seen in office on 08/03/2023 by Dr. Audery Blazing.  Per Dr. Audery Blazing, "patient is scheduled for thyroidectomy as well as prostate surgery. He has excellent functional capacity and can walk 3 miles without having chest pain or dyspnea. If his echocardiogram does not show severe aortic stenosis he may proceed without further evaluation."  Recent echocardiogram showed mild aortic valve stenosis. Repeat echo was recommended in 1 year (07/2024) for monitoring.   Therefore, based on ACC/AHA guidelines, patient would be at acceptable risk for the planned procedure without further cardiovascular testing.   Regarding ASA therapy, we recommend continuation of ASA throughout the perioperative period.  However, if the surgeon feels that cessation of ASA is required in the perioperative period, it may be stopped 5-7 days prior to surgery with a plan to resume it as soon as felt to be feasible from a surgical standpoint in the post-operative period.  I will route this recommendation to the requesting party via Epic fax function and remove from pre-op pool.  Please call with questions.  Jude Norton, NP 08/10/2023, 2:55 PM

## 2023-08-11 DIAGNOSIS — E042 Nontoxic multinodular goiter: Secondary | ICD-10-CM | POA: Diagnosis not present

## 2023-08-11 NOTE — Progress Notes (Signed)
 Patient has received cardiac clearance after results of echocardiogram showing normal LV function.   RN reached out to patient to review next steps with treatment recommendations.  All questions answered, plan of care in progress.

## 2023-08-15 ENCOUNTER — Encounter (HOSPITAL_COMMUNITY): Payer: Self-pay

## 2023-08-17 ENCOUNTER — Other Ambulatory Visit

## 2023-08-18 DIAGNOSIS — J42 Unspecified chronic bronchitis: Secondary | ICD-10-CM | POA: Diagnosis not present

## 2023-08-25 DIAGNOSIS — G4739 Other sleep apnea: Secondary | ICD-10-CM | POA: Diagnosis not present

## 2023-08-25 DIAGNOSIS — R053 Chronic cough: Secondary | ICD-10-CM | POA: Diagnosis not present

## 2023-08-29 ENCOUNTER — Telehealth: Payer: Self-pay

## 2023-08-29 NOTE — Telephone Encounter (Signed)
 Spoke with patient and he expresses that he has an upcoming ablation, thyroidectomy surgery and seed implant for his prostate cancer. Patient states he had a recent MRI and PET scan to let Dr. Leonia Raman review if she wishes. Patient states he will be on 2 months of radiation and will contact our office after if needed to reschedule his colonoscopy after treatment for prostate cancer. FYI Dr. Leonia Raman.

## 2023-08-29 NOTE — Telephone Encounter (Signed)
-----   Message from [facility] Lavona Pounds sent at 07/29/2023  9:16 AM EDT ----- Dr. Leonia Raman wants patient to be called in one month to check on him and r/s appt.

## 2023-08-29 NOTE — Patient Instructions (Signed)
 SURGICAL WAITING ROOM VISITATION  Patients having surgery or a procedure may have no more than 2 support people in the waiting area - these visitors may rotate.    Children under the age of 27 must have an adult with them who is not the patient.  Due to an increase in RSV and influenza rates and associated hospitalizations, children ages 85 and under may not visit patients in [facility] hospitals.  Visitors with respiratory illnesses are discouraged from visiting and should remain at home.  If the patient needs to stay at the hospital during part of their recovery, the visitor guidelines for inpatient rooms apply. Pre-op nurse will coordinate an appropriate time for 1 support person to accompany patient in pre-op.  This support person may not rotate.    Please refer to the [facility] website for the visitor guidelines for Inpatients (after your surgery is over and you are in a regular room).       Your procedure is scheduled on:  09/13/2023    Report to [facility] Main Entrance    Report to admitting at  1100 AM   Call this number if you have problems the morning of surgery [phone]   Do not eat food or drink liquids  :After Midnight.  Fleets enema nite before surgery.                    If you have questions, please contact your surgeon's office.     Oral Hygiene is also important to reduce your risk of infection.                                    Remember - BRUSH YOUR TEETH THE MORNING OF SURGERY WITH YOUR REGULAR TOOTHPASTE  DENTURES WILL BE REMOVED PRIOR TO SURGERY PLEASE DO NOT APPLY "Poly grip" OR ADHESIVES!!!   Do NOT smoke after Midnight   Stop all vitamins and herbal supplements 7 days before surgery.   Take these medicines the morning of surgery with A SIP OF WATER:  amlodipine , nebulzier if needed, clonazepam , prevacid , paxil , flomax               Trulicity- last dose on   DO NOT TAKE ANY ORAL DIABETIC MEDICATIONS DAY OF YOUR SURGERY  Bring  CPAP mask and tubing day of surgery.                              You may not have any metal on your body including hair pins, jewelry, and body piercing             Do not wear make-up, lotions, powders, perfumes/cologne, or deodorant  Do not wear nail polish including gel and S&S, artificial/acrylic nails, or any other type of covering on natural nails including finger and toenails. If you have artificial nails, gel coating, etc. that needs to be removed by a nail salon please have this removed prior to surgery or surgery may need to be canceled/ delayed if the surgeon/ anesthesia feels like they are unable to be safely monitored.   Do not shave  48 hours prior to surgery.               Men may shave face and neck.   Do not bring valuables to the hospital. [facility] IS NOT  RESPONSIBLE   FOR VALUABLES.   Contacts, glasses, dentures or bridgework may not be worn into surgery.   Bring small overnight bag day of surgery.   DO NOT BRING YOUR HOME MEDICATIONS TO THE HOSPITAL. PHARMACY WILL DISPENSE MEDICATIONS LISTED ON YOUR MEDICATION LIST TO YOU DURING YOUR ADMISSION IN THE HOSPITAL!    Patients discharged on the day of surgery will not be allowed to drive home.  Someone NEEDS to stay with you for the first 24 hours after anesthesia.   Special Instructions: Bring a copy of your healthcare power of attorney and living will documents the day of surgery if you haven't scanned them before.              Please read over the following fact sheets you were given: IF YOU HAVE QUESTIONS ABOUT YOUR PRE-OP INSTRUCTIONS PLEASE CALL [phone]   If you received a COVID test during your pre-op visit  it is requested that you wear a mask when out in public, stay away from anyone that may not be feeling well and notify your surgeon if you develop symptoms. If you test positive for Covid or have been in contact with anyone that has tested positive in the last 10 days please notify you  surgeon.    [facility] - Preparing for Surgery Before surgery, you can play an important role.  Because skin is not sterile, your skin needs to be as free of germs as possible.  You can reduce the number of germs on your skin by washing with CHG (chlorahexidine gluconate) soap before surgery.  CHG is an antiseptic cleaner which kills germs and bonds with the skin to continue killing germs even after washing. Please DO NOT use if you have an allergy  to CHG or antibacterial soaps.  If your skin becomes reddened/irritated stop using the CHG and inform your nurse when you arrive at Short Stay. Do not shave (including legs and underarms) for at least 48 hours prior to the first CHG shower.  You may shave your face/neck. Please follow these instructions carefully:  1.  Shower with CHG Soap the night before surgery and the  morning of Surgery.  2.  If you choose to wash your hair, wash your hair first as usual with your  normal  shampoo.  3.  After you shampoo, rinse your hair and body thoroughly to remove the  shampoo.                           4.  Use CHG as you would any other liquid soap.  You can apply chg directly  to the skin and wash                       Gently with a scrungie or clean washcloth.  5.  Apply the CHG Soap to your body ONLY FROM THE NECK DOWN.   Do not use on face/ open                           Wound or open sores. Avoid contact with eyes, ears mouth and genitals (private parts).                       Wash face,  Genitals (private parts) with your normal soap.             6.  Wash thoroughly,  paying special attention to the area where your surgery  will be performed.  7.  Thoroughly rinse your body with warm water from the neck down.  8.  DO NOT shower/wash with your normal soap after using and rinsing off  the CHG Soap.                9.  Pat yourself dry with a clean towel.            10.  Wear clean pajamas.            11.  Place clean sheets on your bed the night of your  first shower and do not  sleep with pets. Day of Surgery : Do not apply any lotions/deodorants the morning of surgery.  Please wear clean clothes to the hospital/surgery center.  FAILURE TO FOLLOW THESE INSTRUCTIONS MAY RESULT IN THE CANCELLATION OF YOUR SURGERY PATIENT SIGNATURE_________________________________  NURSE SIGNATURE__________________________________  ________________________________________________________________________

## 2023-08-29 NOTE — Progress Notes (Addendum)
 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    PT callled and wanted to know what clothes to wear am of procedure- was told something comfortable  And reviewed with pt what meds to take am of procedure again and to take with enough water to get down.  PT voiced understanding.

## 2023-08-29 NOTE — Telephone Encounter (Signed)
 Error

## 2023-09-01 ENCOUNTER — Ambulatory Visit: Admitting: *Deleted

## 2023-09-01 VITALS — Ht 73.0 in | Wt 253.0 lb

## 2023-09-01 DIAGNOSIS — Z0000 Encounter for general adult medical examination without abnormal findings: Secondary | ICD-10-CM | POA: Diagnosis not present

## 2023-09-01 NOTE — Progress Notes (Signed)
 Subjective:   Ronald King is a 78 y.o. who presents for a Medicare Wellness preventive visit.  As a reminder, Annual Wellness Visits don't include a physical exam, and some assessments may be limited, especially if this visit is performed virtually. We may recommend an in-person follow-up visit with your provider if needed.  Visit Complete: Virtual I connected with  Ronald King on 09/01/23 by a audio enabled telemedicine application and verified that I am speaking with the correct person using two identifiers.  Patient Location: Home  Provider Location: Office/Clinic  I discussed the limitations of evaluation and management by telemedicine. The patient expressed understanding and agreed to proceed.  Vital Signs: Because this visit was a virtual/telehealth visit, some criteria may be missing or patient reported. Any vitals not documented were not able to be obtained and vitals that have been documented are patient reported.  VideoDeclined- This patient declined Librarian, academic. Therefore the visit was completed with audio only.  Persons Participating in Visit: Patient.  AWV Questionnaire: Yes: Patient Medicare AWV questionnaire was completed by the patient on 08/29/23; I have confirmed that all information answered by patient is correct and no changes since this date.  Cardiac Risk Factors include: advanced age (>24men, >49 women);diabetes mellitus;male gender;hypertension;dyslipidemia     Objective:     Today's Vitals   09/01/23 1107  Weight: 253 lb (114.8 kg)  Height: 6\' 1"  (1.854 m)   Body mass index is 33.38 kg/m.     09/01/2023   11:22 AM 07/25/2023   10:45 AM 07/14/2023   11:00 AM 07/06/2023    9:35 PM 06/16/2023    6:30 AM 04/14/2023   11:50 AM 05/27/2022   11:01 AM  Advanced Directives  Does Patient Have a Medical Advance Directive? Yes Yes Yes No No Yes Yes  Type of Estate agent of [place];Living  will Healthcare Power of [place];Living will Healthcare Power of [place];Living will   Healthcare Power of [place];Living will Healthcare Power of [place];Living will  Does patient want to make changes to medical advance directive? No - Patient declined No - Patient declined    No - Patient declined   Copy of Healthcare Power of Attorney in Chart? Yes - validated most recent copy scanned in chart (See row information)      Yes - validated most recent copy scanned in chart (See row information)    Current Medications (verified) Outpatient Encounter Medications as of 09/01/2023  Medication Sig   amLODipine  (NORVASC ) 10 MG tablet Take 1 tablet (10 mg total) by mouth daily.   aspirin  EC 81 MG tablet Take 1 tablet (81 mg total) by mouth daily. Swallow whole.   Bioflavonoid Products (VITAMIN C) CHEW Chew 1 each by mouth in the morning.   budesonide  (PULMICORT ) 0.5 MG/2ML nebulizer solution Take 2 mLs (0.5 mg total) by nebulization 2 (two) times daily as needed (shortness of breath, wheezing, cough).   Calcium -Magnesium  (CAL-MAG PO) Take 1 capsule by mouth in the morning.   Cholecalciferol (VITAMIN D3 PO) Take 1,000 Units by mouth in the morning.   clonazePAM  (KLONOPIN ) 1 MG tablet TAKE 1 TABLET(1 MG) BY MOUTH TWICE DAILY (Patient taking differently: Take 1 mg by mouth 2 (two) times daily.)   Coenzyme Q10-Vitamin E (QUNOL ULTRA COQ10 PO) Take 1 capsule by mouth in the morning.   cyanocobalamin (VITAMIN B12) 1000 MCG tablet Take 1,000 mcg by mouth in the morning.   Dulaglutide (TRULICITY) 0.75 MG/0.5ML SOPN Inject 0.75 mg  into the skin every Thursday.   fenofibrate  (TRICOR ) 145 MG tablet Take 1 tablet (145 mg total) by mouth daily.   lansoprazole  (PREVACID ) 30 MG capsule TAKE 1 CAPSULE(30 MG) BY MOUTH TWICE DAILY BEFORE A MEAL   leuprolide, 6 Month, (ELIGARD) 45 MG injection Inject 45 mg into the skin every 6 (six) months.   Lysine 500 MG CAPS Take 500 mg by mouth in the morning.   Multiple Vitamin  (MULTIVITAMIN WITH MINERALS) TABS tablet Take 1 tablet by mouth daily. Centrum Silver for Men 50+   NUBEQA 300 MG tablet Take 600 mg by mouth 2 (two) times daily.   oxymetazoline (AFRIN) 0.05 % nasal spray Place 1 spray into both nostrils 2 (two) times daily as needed for congestion.   PARoxetine  (PAXIL ) 20 MG tablet Take 1 tablet (20 mg total) by mouth daily.   pyridOXINE (VITAMIN B-6) 100 MG tablet Take 100 mg by mouth daily.   rosuvastatin  (CRESTOR ) 40 MG tablet Take 1 tablet (40 mg total) by mouth at bedtime. (Patient taking differently: Take 40 mg by mouth in the morning.)   [DISCONTINUED] tamsulosin (FLOMAX) 0.4 MG CAPS capsule Take 0.4 mg by mouth in the morning and at bedtime. (Patient not taking: Reported on 09/01/2023)   No facility-administered encounter medications on file as of 09/01/2023.    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   Heart murmur    Valve stenosis,  ECHO 01-2021   Hemorrhoids, internal    History of gastrointestinal hemorrhage    Hypertension    Hypogonadism, male    per Dr Kathyanne Parkers   Hypothyroidism    Neuromuscular disorder (HCC)    neuropathy feet   Neuropathy    per neuro-Dr Regis Captain vicodin   OSA (obstructive sleep apnea)    on CPAP   Sleep apnea    CPAP at night   Thyroid  nodule    Vitamin D  deficiency    Past Surgical History:  Procedure Laterality Date   ANAL FISSURE REPAIR     hand fracture surgery     left   KNEE ARTHROSCOPY  Left 2007   Dr. Rozelle Corning   LAPAROSCOPIC NISSEN FUNDOPLICATION  05/1999   Dr. Gaylyn Keas   NOSE SURGERY     PROSTATE BIOPSY     TONSILLECTOMY     UPPER GASTROINTESTINAL ENDOSCOPY     Family History  Problem Relation Age of Onset   Cerebral aneurysm Father    Alcohol abuse Father    Heart disease Father        brain aneurism   Neuropathy Mother    Heart disease Mother        died at age 24   Migraines Brother        several   Heart disease Brother        heart attack   Diabetes Maternal Uncle    CAD Brother  CABG in his 26s   Colon cancer Neg Hx    Prostate cancer Neg Hx    Esophageal cancer Neg Hx    Stomach cancer Neg Hx    Rectal cancer Neg Hx    Social History   Socioeconomic History   Marital status: Married    Spouse name: Not on file   Number of children: 1   Years of education: Not on file   Highest education level: 12th grade  Occupational History   Occupation: Regulatory affairs officer, retired    Comment: retired   Occupation: minister    Comment: [employer]  Tobacco Use   Smoking status: Former    Current packs/day: 0.00    Average packs/day: 1 pack/day for 3.0 years (3.0 ttl pk-yrs)    Types: Cigarettes    Start date: 03/29/1962    Quit date: 03/29/1965    Years since quitting: 58.4   Smokeless tobacco: Never  Vaping Use   Vaping status: Never Used  Substance and Sexual Activity   Alcohol use: No   Drug use: No   Sexual activity: Not Currently  Other Topics Concern   Not on file  Social History Narrative   Lives w/ wife   Social Drivers of Health   Financial Resource Strain: Low Risk  (09/01/2023)   Overall Financial Resource Strain (CARDIA)    Difficulty of Paying Living Expenses: Not very hard  Food Insecurity: No Food Insecurity (09/01/2023)   Hunger Vital Sign    Worried About Running Out of Food in the Last Year: Never true    Ran Out of Food in the Last Year: Never true  Transportation Needs: No Transportation Needs  (09/01/2023)   PRAPARE - Administrator, Civil Service (Medical): No    Lack of Transportation (Non-Medical): No  Physical Activity: Inactive (09/01/2023)   Exercise Vital Sign    Days of Exercise per Week: 0 days    Minutes of Exercise per Session: 0 min  Stress: Stress Concern Present (09/01/2023)   [employer] of Occupational Health - Occupational Stress Questionnaire    Feeling of Stress : To some extent  Social Connections: Moderately Integrated (09/01/2023)   Social Connection and Isolation Panel [NHANES]    Frequency of Communication with Friends and Family: More than three times a week    Frequency of Social Gatherings with Friends and Family: More than three times a week    Attends Religious Services: More than 4 times per year    Active Member of [employer] or Organizations: No    Attends Banker Meetings: Never    Marital Status: Married    Tobacco Counseling Counseling given: Not Answered    Clinical Intake:  Pre-visit preparation completed: Yes  Pain : No/denies pain     BMI - recorded: 33.38 Nutritional Risks: None Diabetes: Yes CBG done?: No Did pt. bring in CBG monitor from home?: No  Lab Results  Component Value Date   HGBA1C 7.0 02/10/2023   HGBA1C 6.7 07/29/2022   HGBA1C 6.3 01/21/2022            Activities of Daily Living     09/01/2023   11:44 AM 08/29/2023   10:17 AM  In your present state of health, do you have any difficulty performing the following activities:  Hearing? 0 0  Vision? 0 0  Comment Dr Johnetta Nab, 10/2022 Dr Johnetta Nab, 10/2022  Difficulty concentrating or making decisions? 0 0  Walking or climbing stairs?  0 0  Dressing or bathing? 0 0  Doing errands, shopping? 0 0  Preparing Food and eating ? N N  Using the Toilet? N N  In the past six months, have you accidently leaked urine? Colie Dawes  Comment has catheter, in contact with specialist. will have aqua ablation on 09/13/23. has catheter, in contact with specialist.  will have aqua ablation on 09/13/23.  Do you have problems with loss of bowel control? N N  Managing your Medications? N N  Managing your Finances? N N  Housekeeping or managing your Housekeeping? N N    Patient Care Team: Ezell Hollow, MD as PCP - General (Internal Medicine) Gordy Lauber, MD as Consulting Physician (Endocrinology) Lorraine Roses, MD as Consulting Physician (Pulmonary Disease) Tally Faes., MD as Consulting Physician (Neurology) Bart Born, MD (Inactive) as Consulting Physician (Urology) Keenan Pastor (Optometry) Katheleen Palmer, RN as Oncology Nurse Navigator  I have updated your Care Teams any recent Medical Services you may have received from other providers in the past year.     Assessment:    This is a routine wellness examination for Ronald King.  Hearing/Vision screen Hearing Screening - Comments:: Denies difficulty Vision Screening - Comments:: Dr Johnetta Nab 10/2022   Goals Addressed             This Visit's Progress    Stay active   On track      Depression Screen     09/01/2023   11:20 AM 07/14/2023   11:27 AM 01/03/2023   10:32 AM 06/30/2022   10:02 AM 05/27/2022   11:05 AM 11/12/2021   10:53 AM 07/09/2021   11:15 AM  PHQ 2/9 Scores  PHQ - 2 Score 1 0 0 0 0 0 0    Fall Risk     08/29/2023   10:17 AM 01/03/2023   10:32 AM 06/30/2022   10:01 AM 05/27/2022   11:01 AM 11/12/2021   10:53 AM  Fall Risk   Falls in the past year? 0 1 1 1  0  Number falls in past yr: 0 0 1 1 0  Injury with Fall? 0 0 1 1 0  Risk for fall due to : No Fall Risks   History of fall(s)   Follow up Education provided Falls evaluation completed Falls evaluation completed Falls evaluation completed Falls evaluation completed  No change since 08/29/23.  MEDICARE RISK AT HOME:  Medicare Risk at Home Any stairs in or around the home?: (Patient-Rptd) Yes If so, are there any without handrails?: (Patient-Rptd) No Home free of loose throw rugs in walkways, pet beds, electrical  cords, etc?: (Patient-Rptd) No Adequate lighting in your home to reduce risk of falls?: (Patient-Rptd) Yes Life alert?: (Patient-Rptd) No Use of a cane, walker or w/c?: (Patient-Rptd) No Grab bars in the bathroom?: (Patient-Rptd) Yes Shower chair or bench in shower?: (Patient-Rptd) Yes Elevated toilet seat or a handicapped toilet?: (Patient-Rptd) No  TIMED UP AND GO:  Was the test performed?  No, audio   Cognitive Function: 6CIT completed    09/24/2015   10:10 AM  MMSE - Mini Mental State Exam  Orientation to time 5  Orientation to Place 5  Registration 3  Attention/ Calculation 5  Recall 3  Language- name 2 objects 2  Language- repeat 1  Language- follow 3 step command 3  Language- read & follow direction 1  Write a sentence 1  Copy design 1  Total score 30  09/01/2023   11:28 AM 05/27/2022   11:14 AM  6CIT Screen  What Year? 0 points 0 points  What month? 0 points 0 points  What time? 0 points 0 points  Count back from 20 0 points 0 points  Months in reverse 0 points 0 points  Repeat phrase 0 points 0 points  Total Score 0 points 0 points    Immunizations Immunization History  Administered Date(s) Administered   Fluad Quad(high Dose 65+) 12/26/2018, 01/07/2021   Fluad Trivalent(High Dose 65+) 02/16/2023   Hepatitis A 11/12/2005, 12/16/2005, 03/06/2015   Hepatitis B 11/12/2005, 12/16/2005, 03/06/2015   IPV 11/12/2005   Influenza Split 01/28/2011, 12/16/2011   Influenza Whole 12/25/2008   Influenza, High Dose Seasonal PF 01/09/2015, 12/13/2016   Influenza,inj,Quad PF,6+ Mos 01/10/2014   Influenza-Unspecified 11/27/2012, 12/29/2017, 12/28/2019   PFIZER(Purple Top)SARS-COV-2 Vaccination 04/15/2019, 05/05/2019, 01/16/2020, 09/25/2020   Pneumococcal Conjugate-13 09/06/2013   Pneumococcal Polysaccharide-23 12/16/2011   Respiratory Syncytial Virus Vaccine,Recomb Aduvanted(Arexvy) 02/16/2023   Td 09/24/2015   Tdap 12/16/2005   Typhoid Live 11/12/2005    Yellow Fever 11/12/2005   Zoster Recombinant(Shingrix) 12/29/2017, 02/24/2018   Zoster, Live 12/15/2012    Screening Tests Health Maintenance  Topic Date Due   Medicare Annual Wellness (AWV)  05/27/2023   Diabetic kidney evaluation - Urine ACR  06/29/2023   FOOT EXAM  07/29/2023   HEMOGLOBIN A1C  08/10/2023   Colonoscopy  03/02/2024 (Originally 11/07/2021)   INFLUENZA VACCINE  10/28/2023   OPHTHALMOLOGY EXAM  03/27/2024   Diabetic kidney evaluation - eGFR measurement  07/24/2024   DTaP/Tdap/Td (3 - Td or Tdap) 09/23/2025   Pneumonia Vaccine 47+ Years old  Completed   Hepatitis C Screening  Completed   Zoster Vaccines- Shingrix  Completed   HPV VACCINES  Aged Out   Meningococcal B Vaccine  Aged Out   COVID-19 Vaccine  Discontinued    Health Maintenance  Health Maintenance Due  Topic Date Due   Medicare Annual Wellness (AWV)  05/27/2023   Diabetic kidney evaluation - Urine ACR  06/29/2023   FOOT EXAM  07/29/2023   HEMOGLOBIN A1C  08/10/2023   Health Maintenance Items Addressed: Will address A1c, urine microalb, foot exam at OV with PCP on 09/07/23. Will postpone colonoscopy for 6 months due to current cancer workup / treatment.  Additional Screening:  Vision Screening: Recommended annual ophthalmology exams for early detection of glaucoma and other disorders of the eye. Would you like a referral to an eye doctor? No    Dental Screening: Recommended annual dental exams for proper oral hygiene  Community Resource Referral / Chronic Care Management: CRR required this visit?  No   CCM required this visit?  No   Plan:    I have personally reviewed and noted the following in the patient's chart:   Medical and social history Use of alcohol, tobacco or illicit drugs  Current medications and supplements including opioid prescriptions. Patient is not currently taking opioid prescriptions. Functional ability and status Nutritional status Physical activity Advanced  directives List of other physicians Hospitalizations, surgeries, and ER visits in previous 12 months Vitals Screenings to include cognitive, depression, and falls Referrals and appointments  In addition, I have reviewed and discussed with patient certain preventive protocols, quality metrics, and best practice recommendations. A written personalized care plan for preventive services as well as general preventive health recommendations were provided to patient.   Susa Engman, CMA   09/01/2023   After Visit Summary: (MyChart) Due to this being a telephonic  visit, the after visit summary with patients personalized plan was offered to patient via MyChart   Notes: Please refer to Routing Comments.

## 2023-09-01 NOTE — Patient Instructions (Signed)
 Mr. Azucena , Thank you for taking time out of your busy schedule to complete your Annual Wellness Visit with me. I enjoyed our conversation and look forward to speaking with you again next year. I, as well as your care team,  appreciate your ongoing commitment to your health goals. Prayers for your treatment and recovery. Keep up your positive attitude!  Please review the following plan we discussed and let me know if I can assist you in the future. Your Game plan/ To Do List     Follow up Visits: Next Medicare AWV with our clinical staff: please call us  at [phone] to schedule your next wellness   visit once you have completed your cancer treatment.   Next Office Visit with your provider: 09/07/23 2pm  Clinician Recommendations:  Aim for 30 minutes of exercise or brisk walking, 6-8 glasses of water, and 5 servings of fruits and vegetables each day.       This is a list of the screening recommended for you and due dates:  Health Maintenance  Topic Date Due   Yearly kidney health urinalysis for diabetes  06/29/2023   Complete foot exam   07/29/2023   Hemoglobin A1C  08/10/2023   Colon Cancer Screening  03/02/2024*   Flu Shot  10/28/2023   Eye exam for diabetics  03/27/2024   Yearly kidney function blood test for diabetes  07/24/2024   Medicare Annual Wellness Visit  08/31/2024   DTaP/Tdap/Td vaccine (3 - Td or Tdap) 09/23/2025   Pneumonia Vaccine  Completed   Hepatitis C Screening  Completed   Zoster (Shingles) Vaccine  Completed   HPV Vaccine  Aged Out   Meningitis B Vaccine  Aged Out   COVID-19 Vaccine  Discontinued  *Topic was postponed. The date shown is not the original due date.

## 2023-09-02 ENCOUNTER — Other Ambulatory Visit: Payer: Self-pay

## 2023-09-02 ENCOUNTER — Other Ambulatory Visit: Payer: Self-pay | Admitting: Gastroenterology

## 2023-09-02 ENCOUNTER — Encounter (HOSPITAL_COMMUNITY)
Admission: RE | Admit: 2023-09-02 | Discharge: 2023-09-02 | Disposition: A | Discharge status: Home / Self Care | Source: Ambulatory Visit | Attending: Urology | Admitting: Urology

## 2023-09-02 ENCOUNTER — Encounter (HOSPITAL_COMMUNITY): Payer: Self-pay

## 2023-09-02 VITALS — BP 134/73 | HR 85 | Temp 98.5°F | Resp 16 | Ht 71.0 in | Wt 251.3 lb

## 2023-09-02 DIAGNOSIS — I350 Nonrheumatic aortic (valve) stenosis: Secondary | ICD-10-CM | POA: Diagnosis not present

## 2023-09-02 DIAGNOSIS — N401 Enlarged prostate with lower urinary tract symptoms: Secondary | ICD-10-CM | POA: Diagnosis not present

## 2023-09-02 DIAGNOSIS — Z01818 Encounter for other preprocedural examination: Secondary | ICD-10-CM

## 2023-09-02 DIAGNOSIS — C787 Secondary malignant neoplasm of liver and intrahepatic bile duct: Secondary | ICD-10-CM | POA: Insufficient documentation

## 2023-09-02 DIAGNOSIS — E039 Hypothyroidism, unspecified: Secondary | ICD-10-CM | POA: Insufficient documentation

## 2023-09-02 DIAGNOSIS — Z87891 Personal history of nicotine dependence: Secondary | ICD-10-CM | POA: Diagnosis not present

## 2023-09-02 DIAGNOSIS — I10 Essential (primary) hypertension: Secondary | ICD-10-CM | POA: Insufficient documentation

## 2023-09-02 DIAGNOSIS — I4891 Unspecified atrial fibrillation: Secondary | ICD-10-CM | POA: Diagnosis not present

## 2023-09-02 DIAGNOSIS — R338 Other retention of urine: Secondary | ICD-10-CM | POA: Diagnosis not present

## 2023-09-02 DIAGNOSIS — Z01812 Encounter for preprocedural laboratory examination: Secondary | ICD-10-CM | POA: Diagnosis not present

## 2023-09-02 DIAGNOSIS — E119 Type 2 diabetes mellitus without complications: Secondary | ICD-10-CM | POA: Diagnosis not present

## 2023-09-02 DIAGNOSIS — G4733 Obstructive sleep apnea (adult) (pediatric): Secondary | ICD-10-CM | POA: Insufficient documentation

## 2023-09-02 LAB — GLUCOSE, CAPILLARY: Glucose-Capillary: 206 mg/dL — ABNORMAL HIGH (ref 70–99)

## 2023-09-02 LAB — BASIC METABOLIC PANEL WITH GFR
Anion gap: 9 (ref 5–15)
BUN: 23 mg/dL (ref 8–23)
CO2: 24 mmol/L (ref 22–32)
Calcium: 9.4 mg/dL (ref 8.9–10.3)
Chloride: 106 mmol/L (ref 98–111)
Creatinine, Ser: 1.27 mg/dL — ABNORMAL HIGH (ref 0.61–1.24)
GFR, Estimated: 59 mL/min — ABNORMAL LOW (ref >60)
Glucose, Bld: 190 mg/dL — ABNORMAL HIGH (ref 70–99)
Potassium: 4 mmol/L (ref 3.5–5.1)
Sodium: 139 mmol/L (ref 135–145)

## 2023-09-02 LAB — CBC
HCT: 40.7 % (ref 39.0–52.0)
Hemoglobin: 13.5 g/dL (ref 13.0–17.0)
MCH: 30.6 pg (ref 26.0–34.0)
MCHC: 33.2 g/dL (ref 30.0–36.0)
MCV: 92.3 fL (ref 80.0–100.0)
Platelets: 191 10*3/uL (ref 150–400)
RBC: 4.41 MIL/uL (ref 4.22–5.81)
RDW: 13.6 % (ref 11.5–15.5)
WBC: 6.1 10*3/uL (ref 4.0–10.5)
nRBC: 0 % (ref 0.0–0.2)

## 2023-09-02 LAB — HEMOGLOBIN A1C
Hgb A1c MFr Bld: 6.5 % — ABNORMAL HIGH (ref 4.8–5.6)
Mean Plasma Glucose: 139.85 mg/dL

## 2023-09-02 NOTE — Progress Notes (Signed)
 RN spoke with patient and provided reassurance regarding treatment plan/recommendations.  Educated patient on ADT mechanism of action, to provide reassurance with anxiety related to cancer growing/spreading.  Patient verbalized understanding.  All questions answered.

## 2023-09-05 NOTE — Progress Notes (Signed)
 Anesthesia Chart Review   Case: [id number] Date/Time: 09/13/23 0715   Procedure: ABLATION, PROSTATE, TRANSURETHRAL, USING WATERJET   Anesthesia type: General   Diagnosis: Enlarged prostate with urinary retention [N40.1, R33.8]   Pre-op diagnosis: BENIGN PROSTATIC HYPERPLASIA WITH URINARY RETENTION   Location: [facility] ROOM 06 / [facility]   Surgeons: Christina Coyer, MD       DISCUSSION:78 y.o. 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 Christina Coyer.   Pt seen by cardiology 08/03/2023. Per OV note will initiate anticoagulants after prostate and thyroid  surgeries. Per OV note, "preoperative evaluation-patient is scheduled for thyroidectomy as well as prostate surgery.  He has excellent functional capacity and can walk 3 miles without having chest pain or dyspnea.  If his echocardiogram does not show severe aortic stenosis he may proceed without further evaluation."  Echo 08/08/2023 with EF 60-65%, mild AS, unchanged from previous echo.   Pt with chronic cough.  Follows with pulmonology.  Last seen 08/25/2023, stable at this visit.  VS: BP 134/73   Pulse 85   Temp 36.9 C (Oral)   Resp 16   Ht 5\' 11"  (1.803 m)   Wt 114 kg   BMI 35.05 kg/m   PROVIDERS: Ezell Hollow, MD is PCP    LABS: Labs reviewed: Acceptable for surgery. (all labs ordered are listed, but only abnormal results are displayed)  Labs Reviewed  HEMOGLOBIN A1C - Abnormal; Notable for the following components:      Result Value   Hgb A1c MFr Bld 6.5 (*)    All other components within normal limits  BASIC METABOLIC PANEL WITH GFR - Abnormal; Notable for the following components:   Glucose, Bld 190 (*)    Creatinine, Ser 1.27 (*)    GFR, Estimated 59 (*)    All other components within normal limits  GLUCOSE, CAPILLARY - Abnormal; Notable for the following  components:   Glucose-Capillary 206 (*)    All other components within normal limits  CBC     IMAGES:   EKG:   CV: Echo 08/08/2023 1. Left ventricular ejection fraction, by estimation, is 60 to 65%. The  left ventricle has normal function. The left ventricle has no regional  wall motion abnormalities. There is mild left ventricular hypertrophy.  Left ventricular diastolic parameters  are indeterminate.   2. Right ventricular systolic function is normal. The right ventricular  size is normal.   3. The mitral valve is normal in structure. No evidence of mitral valve  regurgitation. No evidence of mitral stenosis.   4. The aortic valve is calcified. There is mild calcification of the  aortic valve. There is mild thickening of the aortic valve. Aortic valve  regurgitation is not visualized. Mild aortic valve stenosis. Aortic valve  area, by VTI measures 1.45 cm.  Aortic valve mean gradient measures 16.5 mmHg.   5. The inferior vena cava is normal in size with greater than 50%  respiratory variability, suggesting right atrial pressure of 3 mmHg.  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   Heart murmur    Valve stenosis,  ECHO 01-2021   Hemorrhoids, internal    History of gastrointestinal hemorrhage    Hypertension    Hypogonadism, male    per Dr Kathyanne Parkers   Hypothyroidism    Neuromuscular disorder (HCC)    neuropathy feet   Neuropathy    per neuro-Dr Regis Captain vicodin   OSA (obstructive sleep apnea)    on CPAP   Sleep apnea    CPAP at night   Thyroid  nodule    Vitamin D  deficiency     Past Surgical History:   Procedure Laterality Date   ANAL FISSURE REPAIR     BIOPSY THYROID      hand fracture surgery     left   KNEE ARTHROSCOPY Left 2007   Dr. Rozelle Corning   LAPAROSCOPIC NISSEN FUNDOPLICATION  05/1999   Dr. Gaylyn Keas   NOSE SURGERY     PROSTATE BIOPSY     TONSILLECTOMY     UPPER GASTROINTESTINAL ENDOSCOPY      MEDICATIONS:  amLODipine  (NORVASC ) 10 MG tablet   aspirin  EC 81 MG tablet   Bioflavonoid Products (VITAMIN C) CHEW   budesonide  (PULMICORT ) 0.5 MG/2ML nebulizer solution   Calcium -Magnesium  (CAL-MAG PO)   Cholecalciferol (VITAMIN D3 PO)   clonazePAM  (KLONOPIN ) 1 MG tablet   Coenzyme Q10-Vitamin E (QUNOL ULTRA COQ10 PO)   cyanocobalamin (VITAMIN B12) 1000 MCG tablet   Dulaglutide (TRULICITY) 0.75 MG/0.5ML SOPN   fenofibrate  (TRICOR ) 145 MG tablet   lansoprazole  (PREVACID ) 30 MG capsule   leuprolide, 6 Month, (ELIGARD) 45 MG injection   Lysine 500 MG CAPS   Multiple Vitamin (MULTIVITAMIN WITH MINERALS) TABS tablet   NUBEQA 300 MG tablet   oxymetazoline (AFRIN) 0.05 % nasal spray   PARoxetine  (PAXIL ) 20 MG tablet   pyridOXINE (VITAMIN B-6) 100 MG tablet   rosuvastatin  (CRESTOR ) 40 MG tablet   No current facility-administered medications for this encounter.     Chick Cotton Ward, PA-C [facility] [phone]

## 2023-09-05 NOTE — Anesthesia Preprocedure Evaluation (Addendum)
 Anesthesia Evaluation  Patient identified by MRN, date of birth, ID band Patient awake    Reviewed: Allergy  & Precautions, NPO status , Patient's Chart, lab work & pertinent test results  History of Anesthesia Complications Negative for: history of anesthetic complications  Airway Mallampati: III  TM Distance: >3 FB Neck ROM: Full    Dental no notable dental hx.    Pulmonary sleep apnea and Continuous Positive Airway Pressure Ventilation , former smoker   Pulmonary exam normal        Cardiovascular hypertension, Pt. on medications Normal cardiovascular exam  Echo 08/08/2023 with EF 60-65%, mild AS, unchanged from previous echo   Neuro/Psych  Headaches  Anxiety        GI/Hepatic Neg liver ROS,GERD  ,,  Endo/Other  diabetes (on Trulicity), Type 2Hypothyroidism    Renal/GU Cr 1.27     Musculoskeletal  (+) Arthritis ,    Abdominal   Peds  Hematology negative hematology ROS (+)   Anesthesia Other Findings Day of surgery medications reviewed with patient.  Reproductive/Obstetrics                              Anesthesia Physical Anesthesia Plan  ASA: 2  Anesthesia Plan: General   Post-op Pain Management: Tylenol  PO (pre-op)*   Induction: Intravenous  PONV Risk Score and Plan: 2 and Treatment may vary due to age or medical condition, Ondansetron  and Dexamethasone   Airway Management Planned: LMA  Additional Equipment: None  Intra-op Plan:   Post-operative Plan: Extubation in OR  Informed Consent: I have reviewed the patients History and Physical, chart, labs and discussed the procedure including the risks, benefits and alternatives for the proposed anesthesia with the patient or authorized representative who has indicated his/her understanding and acceptance.     Dental advisory given  Plan Discussed with: CRNA  Anesthesia Plan Comments: (See PAT note 09/02/2023)         Anesthesia Quick Evaluation

## 2023-09-07 ENCOUNTER — Ambulatory Visit (INDEPENDENT_AMBULATORY_CARE_PROVIDER_SITE_OTHER): Payer: Medicare PPO | Admitting: Internal Medicine

## 2023-09-07 ENCOUNTER — Encounter: Payer: Self-pay | Admitting: Internal Medicine

## 2023-09-07 VITALS — BP 120/82 | HR 81 | Temp 98.0°F | Resp 18 | Ht 73.0 in | Wt 257.4 lb

## 2023-09-07 DIAGNOSIS — I482 Chronic atrial fibrillation, unspecified: Secondary | ICD-10-CM | POA: Diagnosis not present

## 2023-09-07 DIAGNOSIS — Z0001 Encounter for general adult medical examination with abnormal findings: Secondary | ICD-10-CM

## 2023-09-07 DIAGNOSIS — C61 Malignant neoplasm of prostate: Secondary | ICD-10-CM

## 2023-09-07 DIAGNOSIS — Z0000 Encounter for general adult medical examination without abnormal findings: Secondary | ICD-10-CM

## 2023-09-07 DIAGNOSIS — F411 Generalized anxiety disorder: Secondary | ICD-10-CM | POA: Diagnosis not present

## 2023-09-07 DIAGNOSIS — C7951 Secondary malignant neoplasm of bone: Secondary | ICD-10-CM | POA: Diagnosis not present

## 2023-09-07 DIAGNOSIS — Z79899 Other long term (current) drug therapy: Secondary | ICD-10-CM | POA: Diagnosis not present

## 2023-09-07 DIAGNOSIS — R053 Chronic cough: Secondary | ICD-10-CM

## 2023-09-07 NOTE — Patient Instructions (Addendum)
 Vaccines to consider: Pneumonia shot Flu shot every fall COVID booster     Next office visit for a checkup in 4 months. Please make an appointment before you leave today

## 2023-09-07 NOTE — Progress Notes (Signed)
 Subjective:    Patient ID: Ronald King, male    DOB: 1945-05-31, 78 y.o.   MRN: [id number]  DOS:  09/07/2023 Type of visit - description: Here for CPX  Here for CPX Since the last visit he was diagnosed with atrial fibrillation and prostate cancer. He has a indwelling Foley catheter.  Also, scheduled for a thyroidectomy.  At this point denies chest pain or difficulty breathing. 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   Heart murmur    Valve stenosis,  ECHO 01-2021   Hemorrhoids, internal    History of gastrointestinal hemorrhage    Hypertension    Hypogonadism, male    per Dr Kathyanne Parkers   Hypothyroidism    Neuromuscular disorder (HCC)    neuropathy feet   Neuropathy    per neuro-Dr Regis Captain vicodin   OSA (obstructive sleep apnea)    on CPAP   Sleep apnea    CPAP at night   Thyroid  nodule    Vitamin D  deficiency     Past Surgical History:  Procedure Laterality Date   ANAL FISSURE REPAIR     BIOPSY THYROID      hand fracture surgery     left   KNEE ARTHROSCOPY Left 2007   Dr. Rozelle Corning   LAPAROSCOPIC NISSEN FUNDOPLICATION  05/1999   Dr. Gaylyn Keas   NOSE SURGERY     PROSTATE BIOPSY     TONSILLECTOMY     UPPER GASTROINTESTINAL ENDOSCOPY     Social History   Social History Narrative   Lives w/ wife     Current Outpatient Medications  Medication Instructions   amLODipine  (NORVASC ) 10 mg, Oral, Daily   aspirin  EC 81 mg, Oral, Daily, Swallow whole.   Bioflavonoid Products (VITAMIN C)  CHEW 1 each, Every morning   budesonide  (PULMICORT ) 0.5 mg, Nebulization, 2 times daily PRN   Calcium -Magnesium  (CAL-MAG PO) 1 capsule, Every morning   cephALEXin  (KEFLEX ) 500 mg, 2 times daily   Cholecalciferol (VITAMIN D3 PO) 1,000 Units, Every morning   clonazePAM  (KLONOPIN ) 1 MG tablet TAKE 1 TABLET(1 MG) BY MOUTH TWICE DAILY   Coenzyme Q10-Vitamin E (QUNOL ULTRA COQ10 PO) 1 capsule, Every morning   cyanocobalamin (VITAMIN B12) 1,000 mcg, Every morning   fenofibrate  (TRICOR ) 145 mg, Oral, Daily   lansoprazole  (PREVACID ) 30 MG capsule TAKE 1 CAPSULE(30 MG) BY MOUTH TWICE DAILY BEFORE A MEAL   leuprolide (6 Month) (ELIGARD) 45 mg, Every 6 months   Lysine 500 mg, Every morning   Multiple Vitamin (MULTIVITAMIN WITH MINERALS) TABS tablet 1 tablet, Daily   Nubeqa 600 mg, 2 times daily   oxymetazoline (AFRIN) 0.05 % nasal spray 1 spray, 2 times daily PRN   PARoxetine  (PAXIL ) 20 mg, Oral, Daily   pyridOXINE (VITAMIN B6) 100 mg, Daily   rosuvastatin  (CRESTOR ) 40 mg, Oral, Daily at bedtime   tamsulosin (FLOMAX)  0.8 mg, Daily after supper   Trulicity 0.75 mg, Every Thu       Objective:   Physical Exam BP 120/82   Pulse 81   Temp 98 F (36.7 C) (Oral)   Resp 18   Ht 6' 1 (1.854 m)   Wt 257 lb 6 oz (116.7 kg)   SpO2 95%   BMI 33.96 kg/m  General: Well developed, NAD, BMI noted Neck: No  thyromegaly  HEENT:  Normocephalic . Face symmetric, atraumatic Lungs:  CTA B Normal respiratory effort, no intercostal retractions, no accessory muscle use. Heart: Irregular, very soft mild systolic murmur. Abdomen:  Not distended, soft, non-tender. No rebound or rigidity.   Lower extremities: no pretibial edema bilaterally  Skin: Exposed areas without rash. Not pale. Not jaundice Neurologic:  alert & oriented X3.  Speech normal, gait appropriate for age and unassisted Strength symmetric and appropriate for age.  Psych: Cognition and judgment appear intact.  Cooperative with normal  attention span and concentration.  Behavior appropriate. No anxious or depressed appearing.     Assessment   Assessment  ENDO: Dr Kathyanne Parkers -DM - hypogonadism  HTN Dislipidemia NEURO: see note 08/08/2017 --+ Neuropathy  --RLS type of symptoms -- Headaches, seen neuro before, multiple meds including botox Obesity, Anxiety -- paxil - clonazepam , Dr Marlane Silver retired, PCP started to prescribe 12-2022 Vitamin D  deficiency PULM:  --OSA, CPAP --Chronic cough  DJD, Dr. Rozelle Corning GU:   Prostate cancer Dx 05/2023, high risk. GI: --GERD, status post Nissen fundoplication --Esophageal polyp -- h/o GI bleed- tranfussion (90s)  PLAN: Here for CPX -Td 2017 - PNM 23:  2013;  prevnar 2015 - s/p zostavax and s/p  shingrex - Report RSV fall 2024 -Recommend: PNM 20, flu shot in the fall, COVID booster if so desired. --CCS: Cscope 08-2008, neg, Cscope 10/2018, next  cscope overdue (10/2021), he is very aware but  at this point has other health priorities. --Labs reviewed.  Recheck labs on RTC. -- POA on file    Other issues addressed today DM: A1c 6.6 (09/02/2023, K PN), has not seen Endo in a while.  Continue Trulicity.  See Endo when possible. Chronic cough: Saw pulmonary 08/25/2023, Atrial fibrillation:  New finding on EKG April 28. Saw cardiology 08/03/2023, echo ordered-- Normal LV function, mild AoS.  Next echo 1 year. Day recommended to postpone anticoagulation due to upcoming surgeries. Aortic stenosis: Mild per recent echo. Prostate cancer: dx 05/2023, scheduled for a prostate ablation and subsequently gold seeds insertion. Has a indwelling catheter  Thyroidectomy: Incidental finding of thyroid  nodule on a PET scan performed due to history of prostate cancer.  Subsequently had a thyroid  ultrasound and biopsy, no results available.  He is now scheduled for thyroidectomy. RTC 4 months (patient prefers 6 months)   Extensive chart review today, since LOV was  Dx with A-fib, prostate cancer, and has a  thyroidectomy scheduled. He is understandably anxious, had multiple questions that I tried to answered to the best of my ability.

## 2023-09-08 ENCOUNTER — Encounter: Payer: Self-pay | Admitting: Internal Medicine

## 2023-09-08 DIAGNOSIS — I4891 Unspecified atrial fibrillation: Secondary | ICD-10-CM | POA: Insufficient documentation

## 2023-09-08 NOTE — Assessment & Plan Note (Signed)
 Here for CPX Other issues addressed today DM: A1c 6.6 (09/02/2023, K PN), has not seen Endo in a while.  Continue Trulicity.  See Endo when possible. Chronic cough: Saw pulmonary 08/25/2023, Atrial fibrillation:  New finding on EKG April 28. Saw cardiology 08/03/2023, echo ordered-- Normal LV function, mild AoS.  Next echo 1 year. Day recommended to postpone anticoagulation due to upcoming surgeries. Aortic stenosis: Mild per recent echo. Prostate cancer: dx 05/2023, scheduled for a prostate ablation and subsequently gold seeds insertion. Has a indwelling catheter  Thyroidectomy: Incidental finding of thyroid  nodule on a PET scan performed due to history of prostate cancer.  Subsequently had a thyroid  ultrasound and biopsy, no results available.  He is now scheduled for thyroidectomy. RTC 4 months (patient prefers 6 months)

## 2023-09-08 NOTE — Assessment & Plan Note (Signed)
 Here for CPX -Td 2017 - PNM 23:  2013;  prevnar 2015 - s/p zostavax and s/p  shingrex - Report RSV fall 2024 -Recommend: PNM 20, flu shot in the fall, COVID booster if so desired. --CCS: Cscope 08-2008, neg, Cscope 10/2018, next  cscope overdue (10/2021), he is very aware but  at this point has other health priorities. --Labs reviewed.  Recheck labs on RTC. -- POA on file

## 2023-09-10 ENCOUNTER — Other Ambulatory Visit: Payer: Self-pay | Admitting: Internal Medicine

## 2023-09-13 ENCOUNTER — Ambulatory Visit (HOSPITAL_COMMUNITY)
Admission: RE | Admit: 2023-09-13 | Discharge: 2023-09-13 | Disposition: A | Discharge status: Home / Self Care | Attending: Urology | Admitting: Urology

## 2023-09-13 ENCOUNTER — Encounter (HOSPITAL_COMMUNITY)
Admission: RE | Disposition: A | Discharge status: Home / Self Care | Payer: Self-pay | Source: Home / Self Care | Attending: Urology

## 2023-09-13 ENCOUNTER — Ambulatory Visit (HOSPITAL_COMMUNITY): Payer: Self-pay | Admitting: Physician Assistant

## 2023-09-13 ENCOUNTER — Ambulatory Visit (HOSPITAL_BASED_OUTPATIENT_CLINIC_OR_DEPARTMENT_OTHER): Payer: Self-pay | Admitting: Anesthesiology

## 2023-09-13 ENCOUNTER — Encounter (HOSPITAL_COMMUNITY): Payer: Self-pay | Admitting: Urology

## 2023-09-13 DIAGNOSIS — E119 Type 2 diabetes mellitus without complications: Secondary | ICD-10-CM | POA: Insufficient documentation

## 2023-09-13 DIAGNOSIS — C61 Malignant neoplasm of prostate: Secondary | ICD-10-CM | POA: Diagnosis not present

## 2023-09-13 DIAGNOSIS — F419 Anxiety disorder, unspecified: Secondary | ICD-10-CM | POA: Diagnosis not present

## 2023-09-13 DIAGNOSIS — R338 Other retention of urine: Secondary | ICD-10-CM

## 2023-09-13 DIAGNOSIS — N401 Enlarged prostate with lower urinary tract symptoms: Secondary | ICD-10-CM | POA: Insufficient documentation

## 2023-09-13 DIAGNOSIS — R350 Frequency of micturition: Secondary | ICD-10-CM | POA: Diagnosis not present

## 2023-09-13 DIAGNOSIS — Z7985 Long-term (current) use of injectable non-insulin antidiabetic drugs: Secondary | ICD-10-CM | POA: Insufficient documentation

## 2023-09-13 DIAGNOSIS — I10 Essential (primary) hypertension: Secondary | ICD-10-CM

## 2023-09-13 DIAGNOSIS — Z87891 Personal history of nicotine dependence: Secondary | ICD-10-CM | POA: Diagnosis not present

## 2023-09-13 DIAGNOSIS — K219 Gastro-esophageal reflux disease without esophagitis: Secondary | ICD-10-CM | POA: Insufficient documentation

## 2023-09-13 DIAGNOSIS — E039 Hypothyroidism, unspecified: Secondary | ICD-10-CM | POA: Insufficient documentation

## 2023-09-13 DIAGNOSIS — R3912 Poor urinary stream: Secondary | ICD-10-CM | POA: Insufficient documentation

## 2023-09-13 DIAGNOSIS — N138 Other obstructive and reflux uropathy: Secondary | ICD-10-CM

## 2023-09-13 DIAGNOSIS — Z01818 Encounter for other preprocedural examination: Secondary | ICD-10-CM

## 2023-09-13 LAB — GLUCOSE, CAPILLARY
Glucose-Capillary: 128 mg/dL — ABNORMAL HIGH (ref 70–99)
Glucose-Capillary: 138 mg/dL — ABNORMAL HIGH (ref 70–99)

## 2023-09-13 SURGERY — ABLATION, PROSTATE, TRANSURETHRAL, USING WATERJET
Anesthesia: General

## 2023-09-13 MED ORDER — MIDAZOLAM HCL 2 MG/2ML IJ SOLN
INTRAMUSCULAR | Status: AC
Start: 2023-09-13 — End: 2023-09-13
  Filled 2023-09-13: qty 2

## 2023-09-13 MED ORDER — FENTANYL CITRATE (PF) 100 MCG/2ML IJ SOLN
INTRAMUSCULAR | Status: DC | PRN
  Administered 2023-09-13: 100 ug via INTRAVENOUS

## 2023-09-13 MED ORDER — ACETAMINOPHEN 500 MG PO TABS
1000.0000 mg | ORAL_TABLET | Freq: Once | ORAL | Status: AC
  Administered 2023-09-13: 1000 mg via ORAL
  Filled 2023-09-13: qty 2

## 2023-09-13 MED ORDER — ORAL CARE MOUTH RINSE: 15.0000 mL | Freq: Once | OROMUCOSAL | Status: AC

## 2023-09-13 MED ORDER — DEXAMETHASONE SODIUM PHOSPHATE 10 MG/ML IJ SOLN
INTRAMUSCULAR | Status: DC | PRN
Start: 2023-09-13 — End: 2023-09-13
  Administered 2023-09-13: 10 mg via INTRAVENOUS

## 2023-09-13 MED ORDER — OXYCODONE HCL 5 MG PO TABS
5.0000 mg | ORAL_TABLET | Freq: Once | ORAL | Status: AC | PRN
  Administered 2023-09-13: 5 mg via ORAL

## 2023-09-13 MED ORDER — INSULIN ASPART 100 UNIT/ML IJ SOLN: 0.0000 [IU] | INTRAMUSCULAR | Status: DC | PRN

## 2023-09-13 MED ORDER — PHENYLEPHRINE 80 MCG/ML (10ML) SYRINGE FOR IV PUSH (FOR BLOOD PRESSURE SUPPORT)
PREFILLED_SYRINGE | INTRAVENOUS | Status: AC
  Filled 2023-09-13: qty 10

## 2023-09-13 MED ORDER — CHLORHEXIDINE GLUCONATE 0.12 % MT SOLN
15.0000 mL | Freq: Once | OROMUCOSAL | Status: AC
  Administered 2023-09-13: 15 mL via OROMUCOSAL

## 2023-09-13 MED ORDER — ONDANSETRON HCL 4 MG/2ML IJ SOLN
INTRAMUSCULAR | Status: AC
  Filled 2023-09-13: qty 2

## 2023-09-13 MED ORDER — MIDAZOLAM HCL 2 MG/2ML IJ SOLN
INTRAMUSCULAR | Status: DC | PRN
Start: 2023-09-13 — End: 2023-09-13
  Administered 2023-09-13: 2 mg via INTRAVENOUS

## 2023-09-13 MED ORDER — ROCURONIUM BROMIDE 10 MG/ML (PF) SYRINGE
PREFILLED_SYRINGE | INTRAVENOUS | Status: AC
  Filled 2023-09-13: qty 10

## 2023-09-13 MED ORDER — ROCURONIUM BROMIDE 100 MG/10ML IV SOLN
INTRAVENOUS | Status: DC | PRN
Start: 2023-09-13 — End: 2023-09-13
  Administered 2023-09-13: 10 mg via INTRAVENOUS
  Administered 2023-09-13: 60 mg via INTRAVENOUS

## 2023-09-13 MED ORDER — DEXAMETHASONE SODIUM PHOSPHATE 10 MG/ML IJ SOLN
INTRAMUSCULAR | Status: AC
  Filled 2023-09-13: qty 1

## 2023-09-13 MED ORDER — TRANEXAMIC ACID-NACL 1000-0.7 MG/100ML-% IV SOLN
1000.0000 mg | INTRAVENOUS | Status: AC
  Administered 2023-09-13: 1000 mg via INTRAVENOUS
  Filled 2023-09-13: qty 100

## 2023-09-13 MED ORDER — PROPOFOL 10 MG/ML IV BOLUS
INTRAVENOUS | Status: DC | PRN
Start: 2023-09-13 — End: 2023-09-13
  Administered 2023-09-13: 170 mg via INTRAVENOUS

## 2023-09-13 MED ORDER — 0.9 % SODIUM CHLORIDE (POUR BTL) OPTIME
TOPICAL | Status: DC | PRN
  Administered 2023-09-13: 1000 mL

## 2023-09-13 MED ORDER — STERILE WATER FOR IRRIGATION IR SOLN
Status: DC | PRN
  Administered 2023-09-13: 1000 mL

## 2023-09-13 MED ORDER — FENTANYL CITRATE PF 50 MCG/ML IJ SOSY
PREFILLED_SYRINGE | INTRAMUSCULAR | Status: AC
  Filled 2023-09-13: qty 3

## 2023-09-13 MED ORDER — NITROFURANTOIN MONOHYD MACRO 100 MG PO CAPS: 100.0000 mg | ORAL_CAPSULE | Freq: Every day | ORAL | 0 refills | Status: DC

## 2023-09-13 MED ORDER — SODIUM CHLORIDE 0.9 % IV SOLN
2.0000 g | INTRAVENOUS | Status: AC
  Administered 2023-09-13: 2 g via INTRAVENOUS
  Filled 2023-09-13: qty 20

## 2023-09-13 MED ORDER — SODIUM CHLORIDE 0.9 % IR SOLN
Status: DC | PRN
  Administered 2023-09-13 (×4): 3000 mL via INTRAVESICAL

## 2023-09-13 MED ORDER — SUGAMMADEX SODIUM 500 MG/5ML IV SOLN
INTRAVENOUS | Status: DC | PRN
Start: 2023-09-13 — End: 2023-09-13
  Administered 2023-09-13: 400 mg via INTRAVENOUS

## 2023-09-13 MED ORDER — ONDANSETRON HCL 4 MG/2ML IJ SOLN
INTRAMUSCULAR | Status: DC | PRN
Start: 2023-09-13 — End: 2023-09-13
  Administered 2023-09-13: 4 mg via INTRAVENOUS

## 2023-09-13 MED ORDER — LACTATED RINGERS IV SOLN: INTRAVENOUS | Status: DC

## 2023-09-13 MED ORDER — LIDOCAINE HCL (CARDIAC) PF 100 MG/5ML IV SOSY
PREFILLED_SYRINGE | INTRAVENOUS | Status: DC | PRN
  Administered 2023-09-13: 100 mg via INTRATRACHEAL

## 2023-09-13 MED ORDER — OXYCODONE HCL 5 MG PO TABS
ORAL_TABLET | ORAL | Status: AC
  Filled 2023-09-13: qty 1

## 2023-09-13 MED ORDER — PHENYLEPHRINE 80 MCG/ML (10ML) SYRINGE FOR IV PUSH (FOR BLOOD PRESSURE SUPPORT)
PREFILLED_SYRINGE | INTRAVENOUS | Status: DC | PRN
Start: 2023-09-13 — End: 2023-09-13
  Administered 2023-09-13: 80 ug via INTRAVENOUS

## 2023-09-13 MED ORDER — DROPERIDOL 2.5 MG/ML IJ SOLN
0.6250 mg | Freq: Once | INTRAMUSCULAR | Status: AC | PRN
  Administered 2023-09-13: 0.625 mg via INTRAVENOUS

## 2023-09-13 MED ORDER — DROPERIDOL 2.5 MG/ML IJ SOLN
INTRAMUSCULAR | Status: AC
Start: 2023-09-13 — End: 2023-09-13
  Filled 2023-09-13: qty 2

## 2023-09-13 MED ORDER — FENTANYL CITRATE (PF) 100 MCG/2ML IJ SOLN
INTRAMUSCULAR | Status: AC
Start: 2023-09-13 — End: 2023-09-13
  Filled 2023-09-13: qty 2

## 2023-09-13 MED ORDER — OXYCODONE HCL 5 MG/5ML PO SOLN: 5.0000 mg | Freq: Once | ORAL | Status: AC | PRN

## 2023-09-13 MED ORDER — FENTANYL CITRATE PF 50 MCG/ML IJ SOSY
25.0000 ug | PREFILLED_SYRINGE | INTRAMUSCULAR | Status: DC | PRN
  Administered 2023-09-13 (×3): 50 ug via INTRAVENOUS

## 2023-09-13 SURGICAL SUPPLY — 26 items
BAG URINE DRAIN 2000ML AR STRL (UROLOGICAL SUPPLIES) ×2 IMPLANT
BAND RUBBER #18 3X1/16 STRL (MISCELLANEOUS) IMPLANT
CATH HEMA 3WAY 30CC 22FR COUDE (CATHETERS) IMPLANT
CATH HEMA 3WAY 30CC 24FR COUDE (CATHETERS) IMPLANT
COVER MAYO STAND STRL (DRAPES) ×2 IMPLANT
DRAPE FOOT SWITCH (DRAPES) ×2 IMPLANT
DRAPE SURG IRRIG POUCH 19X23 (DRAPES) IMPLANT
GEL ULTRASOUND 8.5O AQUASONIC (MISCELLANEOUS) ×2 IMPLANT
GLOVE SURG LX STRL 7.5 STRW (GLOVE) ×2 IMPLANT
GOWN STRL REUS W/ TWL XL LVL3 (GOWN DISPOSABLE) ×2 IMPLANT
HANDPIECE AQUABEAM (MISCELLANEOUS) ×2 IMPLANT
HOLDER FOLEY CATH W/STRAP (MISCELLANEOUS) IMPLANT
KIT TURNOVER KIT A (KITS) ×4 IMPLANT
LOOP CUT BIPOLAR 24F LRG (ELECTROSURGICAL) IMPLANT
MANIFOLD NEPTUNE II (INSTRUMENTS) ×2 IMPLANT
MAT ABSORB FLUID 56X50 GRAY (MISCELLANEOUS) ×2 IMPLANT
PACK CYSTO (CUSTOM PROCEDURE TRAY) ×2 IMPLANT
PACK DRAPE AQUABEAM (MISCELLANEOUS) ×2 IMPLANT
PAD PREP 24X48 CUFFED NSTRL (MISCELLANEOUS) ×2 IMPLANT
PIN SAFETY STERILE (MISCELLANEOUS) IMPLANT
SYR 30ML LL (SYRINGE) ×2 IMPLANT
SYRINGE TOOMEY IRRIG 70ML (MISCELLANEOUS) ×4 IMPLANT
TOWEL OR 17X26 10 PK STRL BLUE (TOWEL DISPOSABLE) ×2 IMPLANT
TUBING CONNECTING 10 (TUBING) ×4 IMPLANT
TUBING UROLOGY SET (TUBING) ×2 IMPLANT
UNDERPAD 30X36 HEAVY ABSORB (UNDERPADS AND DIAPERS) ×2 IMPLANT

## 2023-09-13 NOTE — Transfer of Care (Signed)
 Immediate Anesthesia Transfer of Care Note  Patient: Ronald King  Procedure(s) Performed: Raydell Cahill OF THE PROSTATE  Patient Location: PACU  Anesthesia Type:General  Level of Consciousness: awake, drowsy, and patient cooperative  Airway & Oxygen Therapy: Patient Spontanous Breathing and Patient connected to face mask oxygen  Post-op Assessment: Report given to RN and Post -op Vital signs reviewed and stable  Post vital signs: stable  Last Vitals:  Vitals Value Taken Time  BP 138/80 09/13/23 09:11  Temp    Pulse 90 09/13/23 09:13  Resp 19 09/13/23 09:13  SpO2 95 % 09/13/23 09:13  Vitals shown include unfiled device data.  Last Pain:  Vitals:   09/13/23 0551  TempSrc: Oral         Complications:  Encounter Notable Events  Notable Event Outcome Phase Comment  Difficult to intubate - expected  Intraprocedure Filed from anesthesia note documentation.

## 2023-09-13 NOTE — Discharge Instructions (Signed)
Robotic water jet ablation of the Prostate, Care After The following information offers guidance on how to care for yourself after your procedure. Your health care provider may also give you more specific instructions. If you have problems or questions, contact your health care provider. What can I expect after the procedure? After the procedure, it is common to have: Mild pain in your lower abdomen. Soreness or mild discomfort in your penis or when you urinate. This is from having the catheter inserted during the procedure. A sudden urge to urinate (urgency). A need to urinate often. A small amount of blood in your urine. You may notice some small blood clots in your urine. These are normal. Follow these instructions at home: Medicines Take over-the-counter and prescription medicines only as told by your health care provider. If you were prescribed an antibiotic medicine, take it as told by your health care provider. Do not stop taking the antibiotic even if you start to feel better. Activity  Rest as told by your health care provider. Avoid sitting for a long time without moving. Get up to take short walks every 1-2 hours. This is important to improve blood flow and breathing. Ask for help if you feel weak or unsteady. You may increase your physical activity gradually as you start to feel better. Do not drive or operate machinery until your health care provider says that it is safe. Do not ride in a car for long periods of time, or as told by your health care provider. Avoid intense physical activity for as long as told by your health care provider. Do not lift anything that is heavier than 10 lb (4.5 kg), or the limit that you are told, until your health care provider says that it is safe. Do not have sex until your health care provider approves. Return to your normal activities as told by your health care provider. Ask your health care provider what activities are safe for you. Preventing  constipation  You may need to take these actions to prevent or treat constipation: Drink enough fluid to keep your urine pale yellow. Take over-the-counter or prescription medicines. Eat foods that are high in fiber, such as beans, whole grains, and fresh fruits and vegetables. Limit foods that are high in fat and processed sugars, such as fried or sweet foods.   General instructions Do not strain when you have a bowel movement. Straining may lead to bleeding from the prostate. This may cause blood clots and trouble urinating. Do not use any products that contain nicotine or tobacco. These products include cigarettes, chewing tobacco, and vaping devices, such as e-cigarettes. If you need help quitting, ask your health care provider. If you go home with a tube draining your urine (urinary catheter), care for the catheter as told by your health care provider. Wear compression stockings as told by your health care provider. These stockings help to prevent blood clots and reduce swelling in your legs. Keep all follow-up visits. This is important. Contact a health care provider if: You have signs of infection, such as: Fever or chills. Urine that smells very bad. Swelling around your urethra that is getting worse. Swelling in your penis or testicles. You have difficulty urinating. You have pain that gets worse or does not improve with medicine. You have blood in your urine that does not go away after 1 week of resting and drinking more fluids. You have trouble having a bowel movement. You have trouble having or keeping an erection. No  semen comes out during orgasm (dry ejaculation). You have a urinary catheter in place, and you have: Spasms or pain. Problems with your catheter or your catheter is blocked. Get help right away if: You are unable to urinate. You are having more blood clots in your urine instead of fewer. You have: Large blood clots. A lot of blood in your urine. Pain in  your back or lower abdomen. You have difficulty breathing or shortness of breath. You develop swelling or pain in your leg. These symptoms may be an emergency. Get help right away. Call 911. Do not wait to see if the symptoms will go away. Do not drive yourself to the hospital. Summary After the procedure, it is common to have a small amount of blood in your urine. Follow restrictions about lifting and sexual activity as told by your health care provider. Ask what activities are safe for you. Keep all follow-up visits. This is important. This information is not intended to replace advice given to you by your health care provider. Make sure you discuss any questions you have with your health care provider. Document Revised: 12/09/2020 Document Reviewed: 12/09/2020 Elsevier Patient Education  2024 [employer].

## 2023-09-13 NOTE — Anesthesia Postprocedure Evaluation (Signed)
 Anesthesia Post Note  Patient: Ronald King  Procedure(s) Performed: AQUABLATION OF THE PROSTATE     Patient location during evaluation: PACU Anesthesia Type: General Level of consciousness: awake and alert Pain management: pain level controlled Vital Signs Assessment: post-procedure vital signs reviewed and stable Respiratory status: spontaneous breathing, nonlabored ventilation and respiratory function stable Cardiovascular status: blood pressure returned to baseline Postop Assessment: no apparent nausea or vomiting Anesthetic complications: yes   Encounter Notable Events  Notable Event Outcome Phase Comment  Difficult to intubate - expected  Intraprocedure Filed from anesthesia note documentation.    Last Vitals:  Vitals:   09/13/23 0945 09/13/23 1000  BP: 130/76 123/67  Pulse: 84 88  Resp: 19 13  Temp:    SpO2: 95% 92%    Last Pain:  Vitals:   09/13/23 1000  TempSrc:   PainSc: Asleep                 [name]

## 2023-09-13 NOTE — H&P (Signed)
 H&P  Chief Complaint: BPH, urinary retention, prostate cancer   History of Present Illness: Ronald King is a 78 year old male with a history of BPH.  He has been on and off medicines over the years with an 87 g prostate on imaging.  He developed a rapid rise in his PSA and developed prostate cancer.  He started LT ADT and planning external beam and SBRT to oligometastatic disease.  He also developed urinary retention and failed multiple voiding trials.  He presents today for Aquablation of the prostate to help him get rid of the catheter and void prior to starting external beam radiation.  He has been well.  No cough cold or congestion.  I started him on prophylactic cephalexin  but he was not having and is not having any bladder pain or fever. No CP. No SOB.  He has no signs of A-fib.  He is not on anticoagulation.  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   Heart murmur    Valve stenosis,  ECHO 01-2021   Hemorrhoids, internal    History of gastrointestinal hemorrhage    Hypertension    Hypogonadism, male    per Dr Kathyanne Parkers   Hypothyroidism    Neuromuscular disorder (HCC)    neuropathy feet   Neuropathy    per neuro-Dr Regis Captain vicodin   OSA (obstructive sleep apnea)    on CPAP   Sleep apnea    CPAP at night   Thyroid  nodule    Vitamin D  deficiency    Past Surgical History:  Procedure Laterality Date   ANAL FISSURE REPAIR     BIOPSY THYROID      hand fracture surgery     left   KNEE ARTHROSCOPY Left 2007   Dr. Rozelle Corning   LAPAROSCOPIC NISSEN FUNDOPLICATION  05/1999   Dr. Gaylyn Keas   NOSE SURGERY     PROSTATE BIOPSY     TONSILLECTOMY      UPPER GASTROINTESTINAL ENDOSCOPY      Home Medications:  Medications Prior to Admission  Medication Sig Dispense Refill Last Dose/Taking   amLODipine  (NORVASC ) 10 MG tablet Take 1 tablet (10 mg total) by mouth daily. 90 tablet 1 09/13/2023 at  4:00 AM   aspirin  EC 81 MG tablet Take 1 tablet (81 mg total) by mouth daily. Swallow whole.   Taking   Bioflavonoid Products (VITAMIN C) CHEW Chew 1 each by mouth in the morning.   Taking   budesonide  (PULMICORT ) 0.5 MG/2ML nebulizer solution Take 2 mLs (0.5 mg total) by nebulization 2 (two) times daily as needed (shortness of breath, wheezing, cough). 75 mL 5 Taking As Needed   Calcium -Magnesium  (CAL-MAG PO) Take 1 capsule by mouth in the morning.   Taking   Cholecalciferol (VITAMIN D3 PO) Take 1,000 Units by mouth in the morning.   Taking   clonazePAM  (KLONOPIN ) 1 MG tablet TAKE 1 TABLET(1 MG) BY MOUTH TWICE DAILY (Patient taking differently: Take 1 mg by mouth 2 (two) times daily.) 60 tablet 2 09/13/2023 at  4:00 AM  cyanocobalamin (VITAMIN B12) 1000 MCG tablet Take 1,000 mcg by mouth in the morning.   Taking   Dulaglutide (TRULICITY) 0.75 MG/0.5ML SOPN Inject 0.75 mg into the skin every Thursday.   09/01/2023   fenofibrate  (TRICOR ) 145 MG tablet Take 1 tablet (145 mg total) by mouth daily. 90 tablet 1 09/12/2023   lansoprazole  (PREVACID ) 30 MG capsule TAKE 1 CAPSULE(30 MG) BY MOUTH TWICE DAILY BEFORE A MEAL 120 capsule 0 09/13/2023 at  4:00 AM   Lysine 500 MG CAPS Take 500 mg by mouth in the morning.   09/12/2023   Multiple Vitamin (MULTIVITAMIN WITH MINERALS) TABS tablet Take 1 tablet by mouth daily. Centrum Silver for Men 50+   09/12/2023   NUBEQA 300 MG tablet Take 600 mg by mouth 2 (two) times daily.   09/12/2023   oxymetazoline (AFRIN) 0.05 % nasal spray Place 1 spray into both nostrils 2 (two) times daily as needed for congestion.   09/13/2023 at  4:00 AM   PARoxetine  (PAXIL ) 20 MG tablet Take 1 tablet (20 mg total) by mouth daily. 90 tablet 1  09/13/2023 at  4:00 AM   rosuvastatin  (CRESTOR ) 40 MG tablet Take 1 tablet (40 mg total) by mouth at bedtime. (Patient taking differently: Take 40 mg by mouth in the morning.) 90 tablet 1 09/12/2023   tamsulosin (FLOMAX) 0.4 MG CAPS capsule Take 0.8 mg by mouth daily after supper.   09/13/2023 at  4:00 AM   cephALEXin  (KEFLEX ) 500 MG capsule Take 500 mg by mouth 2 (two) times daily.      Coenzyme Q10-Vitamin E (QUNOL ULTRA COQ10 PO) Take 1 capsule by mouth in the morning. (Patient not taking: Reported on 09/07/2023)      leuprolide, 6 Month, (ELIGARD) 45 MG injection Inject 45 mg into the skin every 6 (six) months.   More than a month   pyridOXINE (VITAMIN B-6) 100 MG tablet Take 100 mg by mouth daily. (Patient not taking: Reported on 09/07/2023)      Allergies:  Allergies  Allergen Reactions   Montelukast Shortness Of Breath   Ceftin [Cefuroxime Axetil] Other (See Comments)    Unknown reaction    Duloxetine     Other reaction(s): Other (See Comments) refuses   Lyrica [Pregabalin] Other (See Comments)    Drowsiness   Metformin Hcl Other (See Comments)    Unknown reaction   Neurontin [Gabapentin] Other (See Comments)    Drowsiness /affected memory   Testosterone  Other (See Comments)    HTN    Family History  Problem Relation Age of Onset   Cerebral aneurysm Father    Alcohol abuse Father    Heart disease Father        brain aneurism   Neuropathy Mother    Heart disease Mother        died at age 77   Migraines Brother        several   Heart disease Brother        heart attack   Diabetes Maternal Uncle    CAD Brother        CABG in his 78s   Colon cancer Neg Hx    Prostate cancer Neg Hx    Esophageal cancer Neg Hx    Stomach cancer Neg Hx    Rectal cancer Neg Hx    Social History:  reports that he quit smoking about 58 years ago. His smoking use included cigarettes. He started smoking about 61 years ago. He has a 3 pack-year smoking  history. He has never used smokeless  tobacco. He reports that he does not drink alcohol and does not use drugs.  ROS: A complete review of systems was performed.  All systems are negative except for pertinent findings as noted. Review of Systems  All other systems reviewed and are negative.    Physical Exam:  Vital signs in last 24 hours: Temp:  [98 F (36.7 C)] 98 F (36.7 C) (06/17 0551) Pulse Rate:  [88] 88 (06/17 0551) Resp:  [18] 18 (06/17 0551) BP: (156)/(76) 156/76 (06/17 0551) SpO2:  [94 %] 94 % (06/17 0551) Weight:  [116.7 kg] 116.7 kg (06/17 0558) General:  Alert and oriented, No acute distress HEENT: Normocephalic, atraumatic Cardiovascular: Regular rate and rhythm Lungs: Regular rate and effort Abdomen: Soft, nontender, nondistended, no abdominal masses Back: No CVA tenderness Extremities: No edema Neurologic: Grossly intact GU: Foley in place, urine clear  Laboratory Data:  Results for orders placed or performed during the hospital encounter of 09/13/23 (from the past 24 hours)  Glucose, capillary     Status: Abnormal   Collection Time: 09/13/23  5:53 AM  Result Value Ref Range   Glucose-Capillary 128 (H) 70 - 99 mg/dL   Comment 1 Notify RN    Comment 2 Document in Chart    No results found for this or any previous visit (from the past 240 hours). Creatinine: No results for input(s): CREATININE in the last 168 hours.  Impression/Assessment/plan: I discussed with the patient the nature, potential benefits, risks and alternatives to robotic water jet ablation of the prostate, including side effects of the proposed treatment, the likelihood of the patient achieving the goals of the procedure, and any potential problems that might occur during the procedure or recuperation.  We discussed expectations for success and possible reasons for failure to void and need the catheter replaced or to learn CIC.  Discussed risk of bleeding, infection, stricture and incontinence among others.  All questions  answered. Patient elects to proceed.    Christina Coyer 09/13/2023, 7:23 AM

## 2023-09-13 NOTE — Anesthesia Procedure Notes (Addendum)
 Procedure Name: Intubation Date/Time: 09/13/2023 7:42 AM  Performed by: Maryanna Smart, CRNAPre-anesthesia Checklist: Patient identified, Emergency Drugs available, Suction available, Patient being monitored and Timeout performed Patient Re-evaluated:Patient Re-evaluated prior to induction Oxygen Delivery Method: Circle system utilized Preoxygenation: Pre-oxygenation with 100% oxygen Induction Type: IV induction Ventilation: Mask ventilation without difficulty Laryngoscope Size: Miller and 2 Grade View: Grade II Tube type: Oral Tube size: 7.0 mm Number of attempts: 2 Airway Equipment and Method: Stylet Placement Confirmation: positive ETCO2, ETT inserted through vocal cords under direct vision, CO2 detector and breath sounds checked- equal and bilateral Secured at: 23 cm Tube secured with: Tape Dental Injury: Teeth and Oropharynx as per pre-operative assessment  Difficulty Due To: Difficult Airway- due to large tongue, Difficult Airway- due to anterior larynx and Difficulty was anticipated Future Recommendations: Recommend- induction with short-acting agent, and alternative techniques readily available Comments: First attempt by CRNA with Mac 3 blade, unable to view glottis. Second attempt by myself with Annabell Key 2 blade, grade 2B view. Atraumatic intubation. Jarrell Merritts, MD

## 2023-09-13 NOTE — Op Note (Signed)
 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 No ECE noted.  Cystoscopy revealed tall lateral lobes and a high bladder neck.   Description of procedure:  He was brought to the operating room and placed supine on the operating table.  After adequate anesthesia he was placed lithotomy position. Timeout was performed to confirm the patient and procedure. The TRUS Stepper was mounted to the Articulating Arm and secured to OR bed. The ultrasound probe was attached to the stepper. Exam under anesthesia was performed and the TRUS was inserted per rectum.  There was no resistance. The ultrasound probe was aligned, and confirmation made that the prostate is centered and aligned using both transverse and sagittal views. The bladder neck, verumontanum and the central/transition zones were identified.  Genitalia were prepped and draped in the usual sterile fashion. The 25F AQUABEAM Handpiece is inserted into the prostatic urethra and a complete cystoscopic evaluation was performed by inspecting the prostate, bladder, and identifying the location of the verumontanum/external sphincter. The AQUABEAM Handpiece was secured to the Handpiece Articulating Arm. Confirmed alignment of AQUABEAM Handpiece and TRUS Probe to be parallel and colinear. Confirmation that AQUABEAM nozzle is centered and anterior of the bladder neck or the median lobe. The cystoscope was then retracted to visualize the verumontanum and external sphincter and the cystoscope tip was positioned just proximal to the external sphincter. Reconfirmed alignment of the TRUS probe with the AQUABEAM Handpiece and compression applied with TRUS probe. Horizontal alignment of the Handpiece waterjet nozzle was  performed. The Aquablation treatment zones were planned utilizing real-time TRUS to visualize the contour of the prostate and the depth and radial angles of resection were defined in the transverse view. In the sagittal view, the AQUABEAM nozzle is identified and position registered with software. The treatment contours were then adjusted to conform to the intended resection margins. The median lobe, bladder neck and verumontanum were marked and confirmed in the treatment contour. The Aquablation Treatment was then started following the resection contour confirmed under ultrasound guidance.  TOTAL AQUABLATION RESECTION TIME: First pass 2:59, Second pass 2:52   Once Aquablation resection was complete the 24 French aqua beam handpiece was carefully removed.  The continuous-flow sheath with the visual obturator was passed and then the loop and handle.  The trigone and the ureteral orifices were identified.  Resection of some of the residual median lobe and bladder neck tissue was done.  The bladder neck was identified at 6:00 and this was taken up to 12:00 with fulguration of the bladder neck and prostate for hemostasis.  Slight amount of anterior tissue was resected.  Similarly from 6:00 up to 12:00 on the left side of the bladder neck was identified by resecting some of the ablated tissue to identify the bladder neck and cauterize any bleeding.  Some anterior tissue on the left was resected.  This created excellent hemostasis.  More anterior tissue on the right side. All the chips were evacuated.  Ureteral orifices again identified and noted to be normal without injury.  The scope was backed out and a 22 [place] hematuria catheter was placed with 30 cc in the balloon.  The balloon was seated at the bladder neck and it was irrigated on light traction and noted to be clear  to pink.  He was hooked up to CBI.  He was cleaned up and placed supine.  Catheter was placed on traction.  He was awakened and taken to the  cover room in stable condition.  Complications: None  Blood loss: 100 mL  Specimens: None  Drains: 22 French three-way hematuria catheter with 30 cc in the balloon  Disposition: Patient stable to PACU

## 2023-09-15 NOTE — Progress Notes (Signed)
 RN spoke with patient to provide additional support after his recent procedure. Education provided on how ADT works to provide reassurance that his cancer is not growing or spreading while we await for his radiation.  Patient very appreciative of call.  Will continue to follow up.

## 2023-09-19 ENCOUNTER — Other Ambulatory Visit: Payer: Self-pay | Admitting: Internal Medicine

## 2023-09-19 DIAGNOSIS — R310 Gross hematuria: Secondary | ICD-10-CM | POA: Diagnosis not present

## 2023-09-19 DIAGNOSIS — R338 Other retention of urine: Secondary | ICD-10-CM | POA: Diagnosis not present

## 2023-09-19 DIAGNOSIS — N401 Enlarged prostate with lower urinary tract symptoms: Secondary | ICD-10-CM | POA: Diagnosis not present

## 2023-09-20 ENCOUNTER — Encounter: Payer: Self-pay | Admitting: Pharmacist

## 2023-09-20 DIAGNOSIS — R338 Other retention of urine: Secondary | ICD-10-CM | POA: Diagnosis not present

## 2023-09-20 DIAGNOSIS — C61 Malignant neoplasm of prostate: Secondary | ICD-10-CM | POA: Diagnosis not present

## 2023-09-20 NOTE — Progress Notes (Signed)
 Pharmacy Quality Measure Review  This patient is appearing on a report for being at risk of failing the adherence measure for cholesterol (statin) medications this calendar year.   Medication: Trulicity Last fill date: 05/05/2023 for 84 day supply per adherence report  Reviewed recent refill history - patient filled 76 DS on 08/08/2023  Insurance report was not up to date. No action needed at this time.   Madelin Ray, PharmD Clinical Pharmacist [facility]  Population Health [phone]

## 2023-09-26 ENCOUNTER — Other Ambulatory Visit (HOSPITAL_COMMUNITY): Payer: Self-pay

## 2023-10-03 ENCOUNTER — Ambulatory Visit: Payer: Self-pay | Admitting: General Surgery

## 2023-10-03 NOTE — Progress Notes (Addendum)
 COVID Vaccine Completed: yes  Date of COVID positive in last 90 days:  PCP - Aloysius Mech, MD Cardiologist - Redell Shallow, MD LOV 08/03/23 Pulmonologist- Carolynne Ba, MD LOV 08/25/23  Cardiac clearance by Dr. Shallow 08/03/23 in Epic   PET- 07/04/23 Epic Chest x-ray -  EKG - 08/03/23 Epic Stress Test -  ECHO - 08/08/23 Epic Cardiac Cath -  Pacemaker/ICD device last checked: Spinal Cord Stimulator:  Bowel Prep -   Sleep Study -  CPAP -   Fasting Blood Sugar -  Checks Blood Sugar _____ times a day  Last dose of GLP1 agonist-  N/A GLP1 instructions:  Do not take after     Last dose of SGLT-2 inhibitors-  N/A SGLT-2 instructions:  Do not take after     Blood Thinner Instructions:  Last dose:   Time: Aspirin  Instructions: ASA 81 Last Dose:  Activity level:  Can go up a flight of stairs and perform activities of daily living without stopping and without symptoms of chest pain or shortness of breath.  Able to exercise without symptoms  Unable to go up a flight of stairs without symptoms of     Anesthesia review: a fib, OSA, HTN, DM2  Patient denies shortness of breath, fever, cough and chest pain at PAT appointment  Patient verbalized understanding of instructions that were given to them at the PAT appointment. Patient was also instructed that they will need to review over the PAT instructions again at home before surgery.

## 2023-10-03 NOTE — Progress Notes (Signed)
 Please placer orders for PAT appointment scheduled 10/14/23.

## 2023-10-03 NOTE — Patient Instructions (Signed)
 SURGICAL WAITING ROOM VISITATION  Patients having surgery or a procedure may have no more than 2 support people in the waiting area - these visitors may rotate.    Children under the age of 5 must have an adult with them who is not the patient.  Visitors with respiratory illnesses are discouraged from visiting and should remain at home.  If the patient needs to stay at the hospital during part of their recovery, the visitor guidelines for inpatient rooms apply. Pre-op nurse will coordinate an appropriate time for 1 support person to accompany patient in pre-op.  This support person may not rotate.    Please refer to the [facility] website for the visitor guidelines for Inpatients (after your surgery is over and you are in a regular room).    Your procedure is scheduled on: 10/14/23   Report to [facility] Main Entrance    Report to admitting at 5:15 AM   Call this number if you have problems the morning of surgery [phone]   Do not eat food :After Midnight.   After Midnight you may have the following liquids until ______ AM/ PM DAY OF SURGERY  Water  Non-Citrus Juices (without pulp, NO RED-Apple, White grape, White cranberry) Black Coffee (NO MILK/CREAM OR CREAMERS, sugar ok)  Clear Tea (NO MILK/CREAM OR CREAMERS, sugar ok) regular and decaf                             Plain Jell-O (NO RED)                                           Fruit ices (not with fruit pulp, NO RED)                                     Popsicles (NO RED)                                                               Sports drinks like Gatorade (NO RED)              Drink 2 Ensure/G2 drinks AT 10:00 PM the night before surgery.        The day of surgery:  Drink ONE (1) Pre-Surgery Clear Ensure or G2 at AM the morning of surgery. Drink in one sitting. Do not sip.  This drink was given to you during your hospital  pre-op appointment visit. Nothing else to drink after completing the  Pre-Surgery  Clear Ensure or G2.          If you have questions, please contact your surgeon's office.   FOLLOW BOWEL PREP AND ANY ADDITIONAL PRE OP INSTRUCTIONS YOU RECEIVED FROM YOUR SURGEON'S OFFICE!!!     Oral Hygiene is also important to reduce your risk of infection.                                    Remember - BRUSH YOUR TEETH THE MORNING OF SURGERY  WITH YOUR REGULAR TOOTHPASTE  DENTURES WILL BE REMOVED PRIOR TO SURGERY PLEASE DO NOT APPLY Poly grip OR ADHESIVES!!!   Do NOT smoke after Midnight   Stop all vitamins and herbal supplements 7 days before surgery.   Take these medicines the morning of surgery with A SIP OF WATER : Amlodipine , Inhalers, Clonazepam , Fenofibrate , Prevacid , Paroxetine   DO NOT TAKE ANY ORAL DIABETIC MEDICATIONS DAY OF YOUR SURGERY  How to Manage Your Diabetes Before and After Surgery  Why is it important to control my blood sugar before and after surgery? Improving blood sugar levels before and after surgery helps healing and can limit problems. A way of improving blood sugar control is eating a healthy diet by:  Eating less sugar and carbohydrates  Increasing activity/exercise  Talking with your doctor about reaching your blood sugar goals High blood sugars (greater than 180 mg/dL) can raise your risk of infections and slow your recovery, so you will need to focus on controlling your diabetes during the weeks before surgery. Make sure that the doctor who takes care of your diabetes knows about your planned surgery including the date and location.  How do I manage my blood sugar before surgery? Check your blood sugar at least 4 times a day, starting 2 days before surgery, to make sure that the level is not too high or low. Check your blood sugar the morning of your surgery when you wake up and every 2 hours until you get to the Short Stay unit. If your blood sugar is less than 70 mg/dL, you will need to treat for low blood sugar: Do not take insulin . Treat a  low blood sugar (less than 70 mg/dL) with  cup of clear juice (cranberry or apple), 4 glucose tablets, OR glucose gel. Recheck blood sugar in 15 minutes after treatment (to make sure it is greater than 70 mg/dL). If your blood sugar is not greater than 70 mg/dL on recheck, call [phone] for further instructions. Report your blood sugar to the short stay nurse when you get to Short Stay.  If you are admitted to the hospital after surgery: Your blood sugar will be checked by the staff and you will probably be given insulin  after surgery (instead of oral diabetes medicines) to make sure you have good blood sugar levels. The goal for blood sugar control after surgery is 80-180 mg/dL.   WHAT DO I DO ABOUT MY DIABETES MEDICATION?  Do not take oral diabetes medicines (pills) the morning of surgery.  Do not take Trulicity after 10/06/23.    DO NOT TAKE THE FOLLOWING 7 DAYS PRIOR TO SURGERY: Ozempic, Wegovy, Rybelsus (Semaglutide), Byetta (exenatide), Bydureon (exenatide ER), Victoza, Saxenda (liraglutide), or Trulicity (dulaglutide) Mounjaro (Tirzepatide) Adlyxin (Lixisenatide), Polyethylene Glycol Loxenatide.  Reviewed and Endorsed by [facility] Patient Education Committee, August 2015  Bring CPAP mask and tubing day of surgery.                              You may not have any metal on your body including jewelry, and body piercing             Do not wear lotions, powders, cologne, or deodorant              Men may shave face and neck.   Do not bring valuables to the hospital. [facility] IS NOT             RESPONSIBLE  FOR VALUABLES.   Contacts, glasses, dentures or bridgework may not be worn into surgery.   Bring small overnight bag day of surgery.   DO NOT BRING YOUR HOME MEDICATIONS TO THE HOSPITAL. PHARMACY WILL DISPENSE MEDICATIONS LISTED ON YOUR MEDICATION LIST TO YOU DURING YOUR ADMISSION IN THE HOSPITAL!   Special Instructions: Bring a copy of your healthcare power of  attorney and living will documents the day of surgery if you haven't scanned them before.              Please read over the following fact sheets you were given: IF YOU HAVE QUESTIONS ABOUT YOUR PRE-OP INSTRUCTIONS PLEASE CALL [phone]   If you received a COVID test during your pre-op visit  it is requested that you wear a mask when out in public, stay away from anyone that may not be feeling well and notify your surgeon if you develop symptoms. If you test positive for Covid or have been in contact with anyone that has tested positive in the last 10 days please notify you surgeon.    [facility] - Preparing for Surgery Before surgery, you can play an important role.  Because skin is not sterile, your skin needs to be as free of germs as possible.  You can reduce the number of germs on your skin by washing with CHG (chlorahexidine gluconate) soap before surgery.  CHG is an antiseptic cleaner which kills germs and bonds with the skin to continue killing germs even after washing. Please DO NOT use if you have an allergy  to CHG or antibacterial soaps.  If your skin becomes reddened/irritated stop using the CHG and inform your nurse when you arrive at Short Stay. Do not shave (including legs and underarms) for at least 48 hours prior to the first CHG shower.  You may shave your face/neck.  Please follow these instructions carefully:  1.  Shower with CHG Soap the night before surgery and the  morning of surgery.  2.  If you choose to wash your hair, wash your hair first as usual with your normal  shampoo.  3.  After you shampoo, rinse your hair and body thoroughly to remove the shampoo.                             4.  Use CHG as you would any other liquid soap.  You can apply chg directly to the skin and wash.  Gently with a scrungie or clean washcloth.  5.  Apply the CHG Soap to your body ONLY FROM THE NECK DOWN.   Do   not use on face/ open                           Wound or open sores.  Avoid contact with eyes, ears mouth and   genitals (private parts).                       Wash face,  Genitals (private parts) with your normal soap.             6.  Wash thoroughly, paying special attention to the area where your    surgery  will be performed.  7.  Thoroughly rinse your body with warm water  from the neck down.  8.  DO NOT shower/wash with your normal soap after using and rinsing off the CHG Soap.  9.  Pat yourself dry with a clean towel.            10.  Wear clean pajamas.            11.  Place clean sheets on your bed the night of your first shower and do not  sleep with pets. Day of Surgery : Do not apply any lotions/deodorants the morning of surgery.  Please wear clean clothes to the hospital/surgery center.  FAILURE TO FOLLOW THESE INSTRUCTIONS MAY RESULT IN THE CANCELLATION OF YOUR SURGERY  PATIENT SIGNATURE_________________________________  NURSE SIGNATURE__________________________________  ________________________________________________________________________

## 2023-10-04 ENCOUNTER — Encounter (HOSPITAL_COMMUNITY)
Admission: RE | Admit: 2023-10-04 | Discharge: 2023-10-04 | Disposition: A | Discharge status: Home / Self Care | Source: Ambulatory Visit | Attending: General Surgery | Admitting: General Surgery

## 2023-10-04 ENCOUNTER — Other Ambulatory Visit: Payer: Self-pay

## 2023-10-04 ENCOUNTER — Encounter (HOSPITAL_COMMUNITY): Payer: Self-pay

## 2023-10-04 DIAGNOSIS — C73 Malignant neoplasm of thyroid gland: Secondary | ICD-10-CM | POA: Insufficient documentation

## 2023-10-04 DIAGNOSIS — E119 Type 2 diabetes mellitus without complications: Secondary | ICD-10-CM

## 2023-10-04 DIAGNOSIS — Z87891 Personal history of nicotine dependence: Secondary | ICD-10-CM | POA: Diagnosis not present

## 2023-10-04 DIAGNOSIS — I10 Essential (primary) hypertension: Secondary | ICD-10-CM | POA: Insufficient documentation

## 2023-10-04 DIAGNOSIS — I350 Nonrheumatic aortic (valve) stenosis: Secondary | ICD-10-CM | POA: Diagnosis not present

## 2023-10-04 DIAGNOSIS — Z01812 Encounter for preprocedural laboratory examination: Secondary | ICD-10-CM | POA: Diagnosis not present

## 2023-10-04 DIAGNOSIS — G4733 Obstructive sleep apnea (adult) (pediatric): Secondary | ICD-10-CM | POA: Diagnosis not present

## 2023-10-04 DIAGNOSIS — Z7985 Long-term (current) use of injectable non-insulin antidiabetic drugs: Secondary | ICD-10-CM | POA: Diagnosis not present

## 2023-10-04 DIAGNOSIS — C61 Malignant neoplasm of prostate: Secondary | ICD-10-CM | POA: Diagnosis not present

## 2023-10-04 DIAGNOSIS — I4891 Unspecified atrial fibrillation: Secondary | ICD-10-CM | POA: Insufficient documentation

## 2023-10-04 DIAGNOSIS — K219 Gastro-esophageal reflux disease without esophagitis: Secondary | ICD-10-CM | POA: Diagnosis not present

## 2023-10-04 DIAGNOSIS — E1140 Type 2 diabetes mellitus with diabetic neuropathy, unspecified: Secondary | ICD-10-CM | POA: Diagnosis not present

## 2023-10-04 LAB — CBC
HCT: 37.8 % — ABNORMAL LOW (ref 39.0–52.0)
Hemoglobin: 12.9 g/dL — ABNORMAL LOW (ref 13.0–17.0)
MCH: 30.9 pg (ref 26.0–34.0)
MCHC: 34.1 g/dL (ref 30.0–36.0)
MCV: 90.6 fL (ref 80.0–100.0)
Platelets: 233 K/uL (ref 150–400)
RBC: 4.17 MIL/uL — ABNORMAL LOW (ref 4.22–5.81)
RDW: 14.1 % (ref 11.5–15.5)
WBC: 6.3 K/uL (ref 4.0–10.5)
nRBC: 0 % (ref 0.0–0.2)

## 2023-10-04 LAB — GLUCOSE, CAPILLARY: Glucose-Capillary: 205 mg/dL — ABNORMAL HIGH (ref 70–99)

## 2023-10-04 LAB — BASIC METABOLIC PANEL WITH GFR
Anion gap: 10 (ref 5–15)
BUN: 20 mg/dL (ref 8–23)
CO2: 26 mmol/L (ref 22–32)
Calcium: 9.5 mg/dL (ref 8.9–10.3)
Chloride: 105 mmol/L (ref 98–111)
Creatinine, Ser: 1.18 mg/dL (ref 0.61–1.24)
GFR, Estimated: >60 mL/min (ref >60)
Glucose, Bld: 154 mg/dL — ABNORMAL HIGH (ref 70–99)
Potassium: 4 mmol/L (ref 3.5–5.1)
Sodium: 141 mmol/L (ref 135–145)

## 2023-10-04 NOTE — Progress Notes (Signed)
 Date of COVID positive in last 90 days:   PCP - Aloysius Mech, MD Cardiologist - Redell Shallow, MD LOV 08/03/23 Pulmonologist- Carolynne Ba, MD LOV 08/25/23   Cardiac clearance by Dr. Shallow 08/03/23 in Epic    PET- 07/04/23 Epic Chest x-ray - n/a EKG - 08/03/23 Epic Stress Test - 5 years ago per pt ECHO - 08/08/23 Epic Cardiac Cath - n/a Pacemaker/ICD device last checked: n/a Spinal Cord Stimulator: n/a   Bowel Prep - no   Sleep Study - sleep disorder CPAP - yes every night   Fasting Blood Sugar -  Checks Blood Sugar  not checking at home   Last dose of GLP1 agonist-  Trulicity, takes Thursdays GLP1 instructions:  Do not take after 10/06/23   Last dose of SGLT-2 inhibitors-  N/A SGLT-2 instructions:  Do not take after       Blood Thinner Instructions:  Last dose:                      Time: Aspirin  Instructions: ASA 81, hold 7 days Last Dose:   Activity level:  Can go up a flight of stairs and perform activities of daily living without stopping and without symptoms of chest pain or shortness of breath.   Anesthesia review: a fib, OSA, HTN, DM2, chronic bronchitis    Patient denies shortness of breath, fever, cough and chest pain at PAT appointment   Patient verbalized understanding of instructions that were given to them at the PAT appointment. Patient was also instructed that they will need to review over the PAT instructions again at home before surger

## 2023-10-04 NOTE — Patient Instructions (Signed)
 SURGICAL WAITING ROOM VISITATION  Patients having surgery or a procedure may have no more than 2 support people in the waiting area - these visitors may rotate.    Children under the age of 65 must have an adult with them who is not the patient.  Visitors with respiratory illnesses are discouraged from visiting and should remain at home.  If the patient needs to stay at the hospital during part of their recovery, the visitor guidelines for inpatient rooms apply. Pre-op nurse will coordinate an appropriate time for 1 support person to accompany patient in pre-op.  This support person may not rotate.    Please refer to the [facility] website for the visitor guidelines for Inpatients (after your surgery is over and you are in a regular room).    Your procedure is scheduled on: 10/14/23   Report to [facility] Main Entrance    Report to admitting at 5:15 AM   Call this number if you have problems the morning of surgery [phone]   Do not eat food or drink liquids :After Midnight.          If you have questions, please contact your surgeon's office.   FOLLOW BOWEL PREP AND ANY ADDITIONAL PRE OP INSTRUCTIONS YOU RECEIVED FROM YOUR SURGEON'S OFFICE!!!     Oral Hygiene is also important to reduce your risk of infection.                                    Remember - BRUSH YOUR TEETH THE MORNING OF SURGERY WITH YOUR REGULAR TOOTHPASTE  DENTURES WILL BE REMOVED PRIOR TO SURGERY PLEASE DO NOT APPLY Poly grip OR ADHESIVES!!!   Stop all vitamins and herbal supplements 7 days before surgery.   Take these medicines the morning of surgery with A SIP OF WATER : Amlodipine , Inhalers, Clonazepam , Fenofibrate , Prevacid , Paroxetine    DO NOT TAKE ANY ORAL DIABETIC MEDICATIONS DAY OF YOUR SURGERY  How to Manage Your Diabetes Before and After Surgery  Why is it important to control my blood sugar before and after surgery? Improving blood sugar levels before and after surgery helps  healing and can limit problems. A way of improving blood sugar control is eating a healthy diet by:  Eating less sugar and carbohydrates  Increasing activity/exercise  Talking with your doctor about reaching your blood sugar goals High blood sugars (greater than 180 mg/dL) can raise your risk of infections and slow your recovery, so you will need to focus on controlling your diabetes during the weeks before surgery. Make sure that the doctor who takes care of your diabetes knows about your planned surgery including the date and location.  How do I manage my blood sugar before surgery? Check your blood sugar at least 4 times a day, starting 2 days before surgery, to make sure that the level is not too high or low. Check your blood sugar the morning of your surgery when you wake up and every 2 hours until you get to the Short Stay unit. If your blood sugar is less than 70 mg/dL, you will need to treat for low blood sugar: Do not take insulin . Treat a low blood sugar (less than 70 mg/dL) with  cup of clear juice (cranberry or apple), 4 glucose tablets, OR glucose gel. Recheck blood sugar in 15 minutes after treatment (to make sure it is greater than 70 mg/dL). If your blood sugar is  not greater than 70 mg/dL on recheck, call [phone] for further instructions. Report your blood sugar to the short stay nurse when you get to Short Stay.  If you are admitted to the hospital after surgery: Your blood sugar will be checked by the staff and you will probably be given insulin  after surgery (instead of oral diabetes medicines) to make sure you have good blood sugar levels. The goal for blood sugar control after surgery is 80-180 mg/dL.   WHAT DO I DO ABOUT MY DIABETES MEDICATION?  Do not take oral diabetes medicines (pills) the morning of surgery.  Do not take Trulicity after 10/06/23.  DO NOT TAKE THE FOLLOWING 7 DAYS PRIOR TO SURGERY: Ozempic, Wegovy, Rybelsus (Semaglutide), Byetta (exenatide),  Bydureon (exenatide ER), Victoza, Saxenda (liraglutide), or Trulicity (dulaglutide) Mounjaro (Tirzepatide) Adlyxin (Lixisenatide), Polyethylene Glycol Loxenatide.  Reviewed and Endorsed by [facility] Patient Education Committee, August 2015  Bring CPAP mask and tubing day of surgery.                              You may not have any metal on your body including jewelry, and body piercing             Do not wear lotions, powders, cologne, or deodorant              Men may shave face and neck.   Do not bring valuables to the hospital. [facility] IS NOT             RESPONSIBLE   FOR VALUABLES.   Contacts, glasses, dentures or bridgework may not be worn into surgery.   Bring small overnight bag day of surgery.   DO NOT BRING YOUR HOME MEDICATIONS TO THE HOSPITAL. PHARMACY WILL DISPENSE MEDICATIONS LISTED ON YOUR MEDICATION LIST TO YOU DURING YOUR ADMISSION IN THE HOSPITAL!   Special Instructions: Bring a copy of your healthcare power of attorney and living will documents the day of surgery if you haven't scanned them before.              Please read over the following fact sheets you were given: IF YOU HAVE QUESTIONS ABOUT YOUR PRE-OP INSTRUCTIONS PLEASE CALL [phone]   If you received a COVID test during your pre-op visit  it is requested that you wear a mask when out in public, stay away from anyone that may not be feeling well and notify your surgeon if you develop symptoms. If you test positive for Covid or have been in contact with anyone that has tested positive in the last 10 days please notify you surgeon.    [place] - Preparing for Surgery Before surgery, you can play an important role.  Because skin is not sterile, your skin needs to be as free of germs as possible.  You can reduce the number of germs on your skin by washing with CHG (chlorahexidine gluconate) soap before surgery.  CHG is an antiseptic cleaner which kills germs and bonds with the skin to continue  killing germs even after washing. Please DO NOT use if you have an allergy  to CHG or antibacterial soaps.  If your skin becomes reddened/irritated stop using the CHG and inform your nurse when you arrive at Short Stay. Do not shave (including legs and underarms) for at least 48 hours prior to the first CHG shower.  You may shave your face/neck.  Please follow these instructions carefully:  1.  Shower with CHG Soap the night before surgery and the  morning of surgery.  2.  If you choose to wash your hair, wash your hair first as usual with your normal  shampoo.  3.  After you shampoo, rinse your hair and body thoroughly to remove the shampoo.                             4.  Use CHG as you would any other liquid soap.  You can apply chg directly to the skin and wash.  Gently with a scrungie or clean washcloth.  5.  Apply the CHG Soap to your body ONLY FROM THE NECK DOWN.   Do   not use on face/ open                           Wound or open sores. Avoid contact with eyes, ears mouth and   genitals (private parts).                       Wash face,  Genitals (private parts) with your normal soap.             6.  Wash thoroughly, paying special attention to the area where your    surgery  will be performed.  7.  Thoroughly rinse your body with warm water  from the neck down.  8.  DO NOT shower/wash with your normal soap after using and rinsing off the CHG Soap.                9.  Pat yourself dry with a clean towel.            10.  Wear clean pajamas.            11.  Place clean sheets on your bed the night of your first shower and do not  sleep with pets. Day of Surgery : Do not apply any lotions/deodorants the morning of surgery.  Please wear clean clothes to the hospital/surgery center.  FAILURE TO FOLLOW THESE INSTRUCTIONS MAY RESULT IN THE CANCELLATION OF YOUR SURGERY  PATIENT SIGNATURE_________________________________  NURSE  SIGNATURE__________________________________  ________________________________________________________________________

## 2023-10-05 NOTE — Anesthesia Preprocedure Evaluation (Addendum)
 Anesthesia Evaluation  Patient identified by MRN, date of birth, ID band Patient awake    Reviewed: Allergy  & Precautions, NPO status , Patient's Chart, lab work & pertinent test results  History of Anesthesia Complications (+) DIFFICULT AIRWAY and history of anesthetic complications  Airway Mallampati: III  TM Distance: >3 FB Neck ROM: Full    Dental no notable dental hx.    Pulmonary shortness of breath, sleep apnea and Continuous Positive Airway Pressure Ventilation , former smoker   Pulmonary exam normal        Cardiovascular hypertension, Pt. on medications Normal cardiovascular exam+ Valvular Problems/Murmurs   Echo 08/08/2023 with EF 60-65%, mild AS, unchanged from previous echo   Neuro/Psych  Headaches  Anxiety      Neuromuscular disease    GI/Hepatic Neg liver ROS,GERD  ,,  Endo/Other  diabetes, Type 2Hypothyroidism    Renal/GU Cr 1.27     Musculoskeletal  (+) Arthritis ,    Abdominal   Peds  Hematology  (+) Blood dyscrasia, anemia   Anesthesia Other Findings Day of surgery medications reviewed with patient.  Reproductive/Obstetrics                              Anesthesia Physical Anesthesia Plan  ASA: 3  Anesthesia Plan: General   Post-op Pain Management: Tylenol  PO (pre-op)*   Induction: Intravenous  PONV Risk Score and Plan: 2 and Treatment may vary due to age or medical condition, Ondansetron  and Dexamethasone   Airway Management Planned: Oral ETT and Video Laryngoscope Planned  Additional Equipment: None  Intra-op Plan:   Post-operative Plan: Extubation in OR  Informed Consent: I have reviewed the patients History and Physical, chart, labs and discussed the procedure including the risks, benefits and alternatives for the proposed anesthesia with the patient or authorized representative who has indicated his/her understanding and acceptance.     Dental advisory  given  Plan Discussed with: CRNA and Anesthesiologist  Anesthesia Plan Comments: (See PAT note from 7/8 ISCUSSION: Junior Huezo is a 78 yo male who presents to PAT prior to surgery above. PMH of former smoking, HTN, A.fib, aortic stenosis, OSA (uses CPAP), chronic cough, GERD s/p Nissen fundiplication (2001), DM, neuropathy, metastatic prostate cancer, thyroid  cancer, anemia.   Prior complications from anesthesia include difficult airway due to large tongue and anterior larynx. Per last airway note on 09/13/23: First attempt by CRNA with Mac 3 blade, unable to view glottis. Second attempt by myself with Cleotilde 2 blade, grade 2B view. Atraumatic intubation. [name], MD   Patient follows with Cardiology for aortic stenosis and newly diagnosis A.fib. Seen on 08/03/23 for pre op clearance due to multiple upcoming surgeries including thyroid  and prostate surgery. Echo updated on 08/08/23 and showed mild AS with mean gradient of 16.5. Cleared for surgery by Dr. Pietro:   preoperative evaluation-patient is scheduled for thyroidectomy as well as prostate surgery.  He has excellent functional capacity and can walk 3 miles without having chest pain or dyspnea.  If his echocardiogram does not show severe aortic stenosis he may proceed without further evaluation.   Of note anticoagulation not started due to upcoming procedures. Plan is to initiate afterwards.   Pt with chronic cough.  Follows with pulmonology.  Last seen 08/25/2023, stable at this visit. Has OSA, uses CPAP. EKG 08/03/23:   Atrial fibrillation, rate 82   CV:   Echo 08/08/23:   IMPRESSIONS      1. Left ventricular  ejection fraction, by estimation, is 60 to 65%. The left ventricle has normal function. The left ventricle has no regional wall motion abnormalities. There is mild left ventricular hypertrophy. Left ventricular diastolic parameters are indeterminate.  2. Right ventricular systolic function is normal. The right  ventricular size is normal.  3. The mitral valve is normal in structure. No evidence of mitral valve regurgitation. No evidence of mitral stenosis.  4. The aortic valve is calcified. There is mild calcification of the aortic valve. There is mild thickening of the aortic valve. Aortic valve regurgitation is not visualized. Mild aortic valve stenosis. Aortic valve area, by VTI measures 1.45 cm. Aortic valve mean gradient measures 16.5 mmHg.  5. The inferior vena cava is normal in size with greater than 50% respiratory variability, suggesting right atrial pressure of 3 mmHg.   Comparison(s): Echocardiogram done 01/29/21 showed an EF of 55-60% with mild AS and an AV Mean Grad of 10 mmHg.    )        Anesthesia Quick Evaluation

## 2023-10-05 NOTE — Progress Notes (Signed)
 Case: [id number] Date/Time: 10/14/23 0715   Procedure: THYROIDECTOMY - TOTAL THYROIDECTOMY   Anesthesia type: General   Diagnosis: Thyroid  nodule [E04.1]   Pre-op diagnosis: THYROID  NODULE   Location: [facility] ROOM 06 / [facility]   Surgeons: Ronald Cordella LABOR, MD       DISCUSSION: Ronald King is a 78 yo male who presents to PAT prior to surgery above. PMH of former smoking, HTN, A.fib, aortic stenosis, OSA (uses CPAP), chronic cough, GERD s/p Nissen fundiplication (2001), DM, neuropathy, metastatic prostate cancer, thyroid  cancer, anemia.  Prior complications from anesthesia include difficult airway due to large tongue and anterior larynx. Per last airway note on 09/13/23: First attempt by CRNA with Mac 3 blade, unable to view glottis. Second attempt by myself with Ronald King 2 blade, grade 2B view. Atraumatic intubation. [name], MD  Patient follows with Cardiology for aortic stenosis and newly diagnosis A.fib. Seen on 08/03/23 for pre op clearance due to multiple upcoming surgeries including thyroid  and prostate surgery. Echo updated on 08/08/23 and showed mild AS with mean gradient of 16.5. Cleared for surgery by Dr. Pietro:  preoperative evaluation-patient is scheduled for thyroidectomy as well as prostate surgery.  He has excellent functional capacity and can walk 3 miles without having chest pain or dyspnea.  If his echocardiogram does not show severe aortic stenosis he may proceed without further evaluation.  Of note anticoagulation not started due to upcoming procedures. Plan is to initiate afterwards.  Pt with chronic cough.  Follows with pulmonology.  Last seen 08/25/2023, stable at this visit. Has OSA, uses CPAP.  LD Trulicity: 7/10  VS: BP (!) 142/79   Pulse 81   Temp 37.1 C (Oral)   Resp 18   Ht 6' 3 (1.905 m)   Wt 113.9 kg   SpO2 95%   BMI 31.37 kg/m   PROVIDERS: Ronald Aloysius BRAVO, MD   LABS: Labs reviewed: Acceptable for surgery. (all labs ordered are listed, but only  abnormal results are displayed)  Labs Reviewed  BASIC METABOLIC PANEL WITH GFR - Abnormal; Notable for the following components:      Result Value   Glucose, Bld 154 (*)    All other components within normal limits  CBC - Abnormal; Notable for the following components:   RBC 4.17 (*)    Hemoglobin 12.9 (*)    HCT 37.8 (*)    All other components within normal limits  GLUCOSE, CAPILLARY - Abnormal; Notable for the following components:   Glucose-Capillary 205 (*)    All other components within normal limits     IMAGES: PET scan 06/21/23:  IMPRESSION: 1. Unusual pattern of radiotracer activity with no specific activity identified within the primary prostate carcinoma in the LEFT lobe but with intense multifocal radiotracer avid skeletal metastasis. 2. 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 Four lesions identified. 3. Large LEFT prostate lobe mass is not specifically radiotracer avid. 4. LEFT external iliac lymph node has mild nonspecific radiotracer avid. 5. No evidence of visceral metastasis or pulmonary metastasis. 6. Radiotracer avid lesion in the RIGHT lobe of the thyroid  gland. Favor primary thyroid  lesion. Recommend thyroid  ultrasound and potential tissue sampling for further evaluation potential thyroid  carcinoma.  EKG 08/03/23:  Atrial fibrillation, rate 82  CV:  Echo 08/08/23:  IMPRESSIONS    1. Left ventricular ejection fraction, by estimation, is 60 to 65%. The left ventricle has normal function. The left ventricle has no regional wall motion abnormalities. There is mild left  ventricular hypertrophy. Left ventricular diastolic parameters are indeterminate.  2. Right ventricular systolic function is normal. The right ventricular size is normal.  3. The mitral valve is normal in structure. No evidence of mitral valve regurgitation. No evidence of mitral stenosis.  4. The aortic valve is calcified. There is  mild calcification of the aortic valve. There is mild thickening of the aortic valve. Aortic valve regurgitation is not visualized. Mild aortic valve stenosis. Aortic valve area, by VTI measures 1.45 cm. Aortic valve mean gradient measures 16.5 mmHg.  5. The inferior vena cava is normal in size with greater than 50% respiratory variability, suggesting right atrial pressure of 3 mmHg.  Comparison(s): Echocardiogram done 01/29/21 showed an EF of 55-60% with mild AS and an AV Mean Grad of 10 mmHg. Past Medical History:  Diagnosis Date   Allergy     Anemia    Anxiety    Aortic stenosis    Blood transfusion without reported diagnosis    Cancer [facility])    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   Heart murmur    Valve stenosis,  ECHO 01-2021   Hemorrhoids, internal    History of gastrointestinal hemorrhage    Hypertension    Hypogonadism, male    per Dr Faythe   Hypothyroidism    Neuromuscular disorder (HCC)    neuropathy feet   Neuropathy    per neuro-Dr Lester vicodin   OSA (obstructive sleep apnea)    on CPAP   Sleep apnea    CPAP at night   Thyroid  nodule    Vitamin D  deficiency     Past Surgical History:  Procedure Laterality Date   ANAL FISSURE REPAIR     BIOPSY THYROID      hand fracture surgery     left   KNEE ARTHROSCOPY Left 2007   Dr. Addie   LAPAROSCOPIC NISSEN FUNDOPLICATION  05/1999   Dr. Gladis   NOSE SURGERY     PROSTATE BIOPSY     TONSILLECTOMY     UPPER GASTROINTESTINAL ENDOSCOPY      MEDICATIONS:  amLODipine  (NORVASC ) 10 MG tablet   aspirin  EC 81 MG tablet   Bioflavonoid Products (VITAMIN C) CHEW   budesonide  (PULMICORT ) 0.5 MG/2ML nebulizer solution   Calcium -Magnesium  (CAL-MAG PO)   Cholecalciferol (VITAMIN D3 PO)   clonazePAM  (KLONOPIN ) 1 MG  tablet   Coenzyme Q10-Vitamin E (QUNOL ULTRA COQ10 PO)   cyanocobalamin (VITAMIN B12) 1000 MCG tablet   Dulaglutide (TRULICITY) 0.75 MG/0.5ML SOPN   fenofibrate  (TRICOR ) 145 MG tablet   lansoprazole  (PREVACID ) 30 MG capsule   leuprolide , 6 Month, (ELIGARD ) 45 MG injection   Lysine 500 MG CAPS   Multiple Vitamin (MULTIVITAMIN WITH MINERALS) TABS tablet   NUBEQA 300 MG tablet   oxymetazoline (AFRIN) 0.05 % nasal spray   PARoxetine  (PAXIL ) 20 MG tablet   phenazopyridine (PYRIDIUM) 97 MG tablet   pyridOXINE (VITAMIN B-6) 100 MG tablet   rosuvastatin  (CRESTOR ) 40 MG tablet   No current facility-administered medications for this encounter.   Burnard CHRISTELLA Odis DEVONNA [facility] Short Stay/Anesthesiology [facility] Phone [phone] 10/05/2023 3:27 PM

## 2023-10-12 DIAGNOSIS — R338 Other retention of urine: Secondary | ICD-10-CM | POA: Diagnosis not present

## 2023-10-12 DIAGNOSIS — N401 Enlarged prostate with lower urinary tract symptoms: Secondary | ICD-10-CM | POA: Diagnosis not present

## 2023-10-14 ENCOUNTER — Encounter (HOSPITAL_COMMUNITY)
Admission: RE | Disposition: A | Discharge status: Home / Self Care | Payer: Self-pay | Source: Home / Self Care | Attending: General Surgery

## 2023-10-14 ENCOUNTER — Other Ambulatory Visit: Payer: Self-pay

## 2023-10-14 ENCOUNTER — Ambulatory Visit (HOSPITAL_COMMUNITY): Payer: Self-pay | Admitting: Medical

## 2023-10-14 ENCOUNTER — Ambulatory Visit (HOSPITAL_COMMUNITY): Payer: Self-pay | Admitting: Anesthesiology

## 2023-10-14 ENCOUNTER — Encounter (HOSPITAL_COMMUNITY): Payer: Self-pay | Admitting: General Surgery

## 2023-10-14 ENCOUNTER — Observation Stay (HOSPITAL_COMMUNITY)
Admission: RE | Admit: 2023-10-14 | Discharge: 2023-10-15 | Disposition: A | Discharge status: Home / Self Care | Attending: General Surgery | Admitting: General Surgery

## 2023-10-14 DIAGNOSIS — D34 Benign neoplasm of thyroid gland: Secondary | ICD-10-CM | POA: Diagnosis not present

## 2023-10-14 DIAGNOSIS — G4733 Obstructive sleep apnea (adult) (pediatric): Secondary | ICD-10-CM

## 2023-10-14 DIAGNOSIS — E039 Hypothyroidism, unspecified: Secondary | ICD-10-CM | POA: Insufficient documentation

## 2023-10-14 DIAGNOSIS — I10 Essential (primary) hypertension: Secondary | ICD-10-CM | POA: Insufficient documentation

## 2023-10-14 DIAGNOSIS — Z87891 Personal history of nicotine dependence: Secondary | ICD-10-CM | POA: Insufficient documentation

## 2023-10-14 DIAGNOSIS — C73 Malignant neoplasm of thyroid gland: Secondary | ICD-10-CM

## 2023-10-14 DIAGNOSIS — I4891 Unspecified atrial fibrillation: Secondary | ICD-10-CM

## 2023-10-14 DIAGNOSIS — E119 Type 2 diabetes mellitus without complications: Secondary | ICD-10-CM | POA: Diagnosis not present

## 2023-10-14 DIAGNOSIS — Z7982 Long term (current) use of aspirin: Secondary | ICD-10-CM | POA: Insufficient documentation

## 2023-10-14 HISTORY — PX: THYROIDECTOMY: SHX17

## 2023-10-14 LAB — CBC
HCT: 39.7 % (ref 39.0–52.0)
Hemoglobin: 13.7 g/dL (ref 13.0–17.0)
MCH: 31.6 pg (ref 26.0–34.0)
MCHC: 34.5 g/dL (ref 30.0–36.0)
MCV: 91.5 fL (ref 80.0–100.0)
Platelets: 179 K/uL (ref 150–400)
RBC: 4.34 MIL/uL (ref 4.22–5.81)
RDW: 14 % (ref 11.5–15.5)
WBC: 6.5 K/uL (ref 4.0–10.5)
nRBC: 0 % (ref 0.0–0.2)

## 2023-10-14 LAB — GLUCOSE, CAPILLARY
Glucose-Capillary: 123 mg/dL — ABNORMAL HIGH (ref 70–99)
Glucose-Capillary: 170 mg/dL — ABNORMAL HIGH (ref 70–99)
Glucose-Capillary: 166 mg/dL — ABNORMAL HIGH (ref 70–99)
Glucose-Capillary: 202 mg/dL — ABNORMAL HIGH (ref 70–99)
Glucose-Capillary: 197 mg/dL — ABNORMAL HIGH (ref 70–99)

## 2023-10-14 LAB — CREATININE, SERUM
Creatinine, Ser: 1.06 mg/dL (ref 0.61–1.24)
GFR, Estimated: >60 mL/min (ref >60)

## 2023-10-14 SURGERY — THYROIDECTOMY
Anesthesia: General | Site: Neck

## 2023-10-14 MED ORDER — SUGAMMADEX SODIUM 200 MG/2ML IV SOLN
INTRAVENOUS | Status: DC | PRN
  Administered 2023-10-14: 200 mg via INTRAVENOUS

## 2023-10-14 MED ORDER — ONDANSETRON 4 MG PO TBDP: 4.0000 mg | ORAL_TABLET | Freq: Four times a day (QID) | ORAL | Status: DC | PRN

## 2023-10-14 MED ORDER — ROCURONIUM BROMIDE 10 MG/ML (PF) SYRINGE
PREFILLED_SYRINGE | INTRAVENOUS | Status: AC
  Filled 2023-10-14: qty 10

## 2023-10-14 MED ORDER — PHENYLEPHRINE HCL-NACL 20-0.9 MG/250ML-% IV SOLN
INTRAVENOUS | Status: AC
  Filled 2023-10-14: qty 250

## 2023-10-14 MED ORDER — BUPIVACAINE-EPINEPHRINE (PF) 0.25% -1:200000 IJ SOLN
INTRAMUSCULAR | Status: AC
Start: 2023-10-14 — End: 2023-10-14
  Filled 2023-10-14: qty 30

## 2023-10-14 MED ORDER — GABAPENTIN 300 MG PO CAPS
300.0000 mg | ORAL_CAPSULE | ORAL | Status: AC
  Administered 2023-10-14: 300 mg via ORAL
  Filled 2023-10-14: qty 1

## 2023-10-14 MED ORDER — IBUPROFEN 200 MG PO TABS: 600.0000 mg | ORAL_TABLET | Freq: Four times a day (QID) | ORAL | 0 refills | Status: DC

## 2023-10-14 MED ORDER — LEVOTHYROXINE SODIUM 25 MCG PO TABS
162.5000 ug | ORAL_TABLET | Freq: Every day | ORAL | Status: DC
  Administered 2023-10-15: 162.5 ug via ORAL
  Filled 2023-10-14: qty 2
  Filled 2023-10-14: qty 1
  Filled 2023-10-14: qty 0.5

## 2023-10-14 MED ORDER — HYDROMORPHONE HCL 1 MG/ML IJ SOLN
0.5000 mg | INTRAMUSCULAR | Status: DC | PRN
  Administered 2023-10-14: 0.5 mg via INTRAVENOUS
  Filled 2023-10-14: qty 0.5

## 2023-10-14 MED ORDER — INSULIN ASPART 100 UNIT/ML IJ SOLN
0.0000 [IU] | Freq: Three times a day (TID) | INTRAMUSCULAR | Status: DC
  Administered 2023-10-14: 5 [IU] via SUBCUTANEOUS
  Administered 2023-10-14: 3 [IU] via SUBCUTANEOUS
  Administered 2023-10-15: 2 [IU] via SUBCUTANEOUS

## 2023-10-14 MED ORDER — CALCIUM CARBONATE ANTACID 500 MG PO CHEW: 400.0000 mg | CHEWABLE_TABLET | Freq: Three times a day (TID) | ORAL | 1 refills | Status: DC

## 2023-10-14 MED ORDER — DEXAMETHASONE SODIUM PHOSPHATE 10 MG/ML IJ SOLN
INTRAMUSCULAR | Status: DC | PRN
  Administered 2023-10-14: 4 mg via INTRAVENOUS
  Administered 2023-10-14: 6 mg via INTRAVENOUS

## 2023-10-14 MED ORDER — EPHEDRINE SULFATE (PRESSORS) 50 MG/ML IJ SOLN
INTRAMUSCULAR | Status: DC | PRN
Start: 2023-10-14 — End: 2023-10-14
  Administered 2023-10-14: 5 mg via INTRAVENOUS
  Administered 2023-10-14: 2.5 mg via INTRAVENOUS

## 2023-10-14 MED ORDER — PROPOFOL 10 MG/ML IV BOLUS
INTRAVENOUS | Status: AC
  Filled 2023-10-14: qty 20

## 2023-10-14 MED ORDER — FENTANYL CITRATE PF 50 MCG/ML IJ SOSY
25.0000 ug | PREFILLED_SYRINGE | INTRAMUSCULAR | Status: DC | PRN
  Administered 2023-10-14: 50 ug via INTRAVENOUS

## 2023-10-14 MED ORDER — LEVOTHYROXINE SODIUM 112 MCG PO TABS: 168.0000 ug | ORAL_TABLET | Freq: Every day | ORAL | 3 refills | Status: DC

## 2023-10-14 MED ORDER — BUPIVACAINE-EPINEPHRINE (PF) 0.25% -1:200000 IJ SOLN
INTRAMUSCULAR | Status: DC | PRN
  Administered 2023-10-14: 20 mL

## 2023-10-14 MED ORDER — ORAL CARE MOUTH RINSE: 15.0000 mL | Freq: Once | OROMUCOSAL | Status: DC

## 2023-10-14 MED ORDER — OXYCODONE HCL 5 MG PO TABS: 5.0000 mg | ORAL_TABLET | Freq: Three times a day (TID) | ORAL | 0 refills | Status: DC | PRN

## 2023-10-14 MED ORDER — ASPIRIN 81 MG PO TBEC
81.0000 mg | DELAYED_RELEASE_TABLET | Freq: Every day | ORAL | Status: DC
  Administered 2023-10-15: 81 mg via ORAL
  Filled 2023-10-14: qty 1

## 2023-10-14 MED ORDER — ROCURONIUM BROMIDE 100 MG/10ML IV SOLN
INTRAVENOUS | Status: DC | PRN
  Administered 2023-10-14: 10 mg via INTRAVENOUS
  Administered 2023-10-14: 60 mg via INTRAVENOUS
  Administered 2023-10-14 (×2): 20 mg via INTRAVENOUS

## 2023-10-14 MED ORDER — MEPERIDINE HCL 25 MG/ML IJ SOLN: 6.2500 mg | INTRAMUSCULAR | Status: DC | PRN

## 2023-10-14 MED ORDER — OXYCODONE HCL 5 MG PO TABS
5.0000 mg | ORAL_TABLET | ORAL | Status: DC | PRN
  Administered 2023-10-14: 10 mg via ORAL
  Administered 2023-10-14 – 2023-10-15 (×2): 5 mg via ORAL
  Filled 2023-10-14: qty 2
  Filled 2023-10-14 (×2): qty 1

## 2023-10-14 MED ORDER — CALCIUM CARBONATE ANTACID 500 MG PO CHEW
400.0000 mg | CHEWABLE_TABLET | Freq: Three times a day (TID) | ORAL | Status: DC
  Administered 2023-10-14 – 2023-10-15 (×3): 400 mg via ORAL
  Filled 2023-10-14 (×3): qty 2

## 2023-10-14 MED ORDER — OXYCODONE HCL 5 MG/5ML PO SOLN: 5.0000 mg | Freq: Once | ORAL | Status: DC | PRN

## 2023-10-14 MED ORDER — LIDOCAINE HCL (PF) 2 % IJ SOLN
INTRAMUSCULAR | Status: AC
  Filled 2023-10-14: qty 5

## 2023-10-14 MED ORDER — DEXAMETHASONE SODIUM PHOSPHATE 10 MG/ML IJ SOLN
INTRAMUSCULAR | Status: AC
  Filled 2023-10-14: qty 1

## 2023-10-14 MED ORDER — PANTOPRAZOLE SODIUM 40 MG PO TBEC
40.0000 mg | DELAYED_RELEASE_TABLET | Freq: Every day | ORAL | Status: DC
  Administered 2023-10-15: 40 mg via ORAL
  Filled 2023-10-14: qty 1

## 2023-10-14 MED ORDER — METOPROLOL TARTRATE 5 MG/5ML IV SOLN: 5.0000 mg | Freq: Four times a day (QID) | INTRAVENOUS | Status: DC | PRN

## 2023-10-14 MED ORDER — FENTANYL CITRATE PF 50 MCG/ML IJ SOSY
PREFILLED_SYRINGE | INTRAMUSCULAR | Status: AC
  Filled 2023-10-14: qty 2

## 2023-10-14 MED ORDER — LACTATED RINGERS IV SOLN: INTRAVENOUS | Status: DC

## 2023-10-14 MED ORDER — DEXMEDETOMIDINE HCL IN NACL 80 MCG/20ML IV SOLN
INTRAVENOUS | Status: DC | PRN
  Administered 2023-10-14 (×2): 4 ug via INTRAVENOUS

## 2023-10-14 MED ORDER — CHLORHEXIDINE GLUCONATE CLOTH 2 % EX PADS: 6.0000 | MEDICATED_PAD | Freq: Once | CUTANEOUS | Status: DC

## 2023-10-14 MED ORDER — PHENYLEPHRINE HCL (PRESSORS) 10 MG/ML IV SOLN
INTRAVENOUS | Status: DC | PRN
Start: 2023-10-14 — End: 2023-10-14
  Administered 2023-10-14: 160 ug via INTRAVENOUS

## 2023-10-14 MED ORDER — CELECOXIB 200 MG PO CAPS
200.0000 mg | ORAL_CAPSULE | ORAL | Status: AC
  Administered 2023-10-14: 200 mg via ORAL
  Filled 2023-10-14: qty 1

## 2023-10-14 MED ORDER — INSULIN ASPART 100 UNIT/ML IJ SOLN: 0.0000 [IU] | Freq: Every day | INTRAMUSCULAR | Status: DC

## 2023-10-14 MED ORDER — PROPOFOL 10 MG/ML IV BOLUS
INTRAVENOUS | Status: DC | PRN
  Administered 2023-10-14: 200 mg via INTRAVENOUS

## 2023-10-14 MED ORDER — PHENYLEPHRINE 80 MCG/ML (10ML) SYRINGE FOR IV PUSH (FOR BLOOD PRESSURE SUPPORT)
PREFILLED_SYRINGE | INTRAVENOUS | Status: AC
  Filled 2023-10-14: qty 10

## 2023-10-14 MED ORDER — OXYCODONE HCL 5 MG PO TABS: 5.0000 mg | ORAL_TABLET | Freq: Once | ORAL | Status: DC | PRN

## 2023-10-14 MED ORDER — GUAIFENESIN-DM 100-10 MG/5ML PO SYRP
5.0000 mL | ORAL_SOLUTION | ORAL | Status: DC | PRN
  Administered 2023-10-14: 5 mL via ORAL
  Filled 2023-10-14: qty 10

## 2023-10-14 MED ORDER — FENTANYL CITRATE (PF) 250 MCG/5ML IJ SOLN
INTRAMUSCULAR | Status: AC
  Filled 2023-10-14: qty 5

## 2023-10-14 MED ORDER — DEXMEDETOMIDINE HCL IN NACL 80 MCG/20ML IV SOLN
INTRAVENOUS | Status: AC
  Filled 2023-10-14: qty 20

## 2023-10-14 MED ORDER — BUDESONIDE 0.5 MG/2ML IN SUSP: 0.5000 mg | Freq: Two times a day (BID) | RESPIRATORY_TRACT | Status: DC | PRN

## 2023-10-14 MED ORDER — ONDANSETRON HCL 4 MG/2ML IJ SOLN
INTRAMUSCULAR | Status: AC
Start: 2023-10-14 — End: 2023-10-14
  Filled 2023-10-14: qty 2

## 2023-10-14 MED ORDER — ACETAMINOPHEN 500 MG PO TABS
1000.0000 mg | ORAL_TABLET | ORAL | Status: AC
  Administered 2023-10-14: 1000 mg via ORAL
  Filled 2023-10-14: qty 2

## 2023-10-14 MED ORDER — ROSUVASTATIN CALCIUM 20 MG PO TABS
40.0000 mg | ORAL_TABLET | Freq: Every day | ORAL | Status: DC
  Administered 2023-10-14: 40 mg via ORAL
  Filled 2023-10-14: qty 2

## 2023-10-14 MED ORDER — CLONAZEPAM 1 MG PO TABS
1.0000 mg | ORAL_TABLET | Freq: Two times a day (BID) | ORAL | Status: DC
  Administered 2023-10-15: 1 mg via ORAL
  Filled 2023-10-14: qty 1

## 2023-10-14 MED ORDER — ONDANSETRON HCL 4 MG/2ML IJ SOLN: 4.0000 mg | Freq: Once | INTRAMUSCULAR | Status: DC | PRN

## 2023-10-14 MED ORDER — PHENYLEPHRINE HCL-NACL 20-0.9 MG/250ML-% IV SOLN
INTRAVENOUS | Status: DC | PRN
  Administered 2023-10-14: 30 ug/min via INTRAVENOUS

## 2023-10-14 MED ORDER — INSULIN ASPART 100 UNIT/ML IJ SOLN: 0.0000 [IU] | INTRAMUSCULAR | Status: DC | PRN

## 2023-10-14 MED ORDER — FENTANYL CITRATE (PF) 100 MCG/2ML IJ SOLN
INTRAMUSCULAR | Status: DC | PRN
  Administered 2023-10-14: 50 ug via INTRAVENOUS
  Administered 2023-10-14: 25 ug via INTRAVENOUS
  Administered 2023-10-14: 100 ug via INTRAVENOUS
  Administered 2023-10-14: 25 ug via INTRAVENOUS

## 2023-10-14 MED ORDER — ONDANSETRON HCL 4 MG/2ML IJ SOLN
4.0000 mg | Freq: Four times a day (QID) | INTRAMUSCULAR | Status: DC | PRN
  Administered 2023-10-14: 4 mg via INTRAVENOUS

## 2023-10-14 MED ORDER — 0.9 % SODIUM CHLORIDE (POUR BTL) OPTIME
TOPICAL | Status: DC | PRN
  Administered 2023-10-14: 1000 mL

## 2023-10-14 MED ORDER — LIDOCAINE HCL (CARDIAC) PF 100 MG/5ML IV SOSY
PREFILLED_SYRINGE | INTRAVENOUS | Status: DC | PRN
  Administered 2023-10-14: 20 mg via INTRAVENOUS
  Administered 2023-10-14: 100 mg via INTRAVENOUS

## 2023-10-14 MED ORDER — CHLORHEXIDINE GLUCONATE 0.12 % MT SOLN: 15.0000 mL | Freq: Once | OROMUCOSAL | Status: DC

## 2023-10-14 MED ORDER — ACETAMINOPHEN 325 MG PO TABS: 650.0000 mg | ORAL_TABLET | Freq: Four times a day (QID) | ORAL | 0 refills | Status: DC

## 2023-10-14 MED ORDER — ONDANSETRON HCL 4 MG/2ML IJ SOLN
INTRAMUSCULAR | Status: DC | PRN
  Administered 2023-10-14: 4 mg via INTRAVENOUS

## 2023-10-14 MED ORDER — ONDANSETRON HCL 4 MG/2ML IJ SOLN
INTRAMUSCULAR | Status: AC
  Filled 2023-10-14: qty 2

## 2023-10-14 MED ORDER — ACETAMINOPHEN 500 MG PO TABS
1000.0000 mg | ORAL_TABLET | Freq: Four times a day (QID) | ORAL | Status: DC
  Administered 2023-10-14 – 2023-10-15 (×3): 1000 mg via ORAL
  Filled 2023-10-14 (×3): qty 2

## 2023-10-14 MED ORDER — ENOXAPARIN SODIUM 40 MG/0.4ML IJ SOSY
40.0000 mg | PREFILLED_SYRINGE | INTRAMUSCULAR | Status: DC
  Administered 2023-10-15: 40 mg via SUBCUTANEOUS
  Filled 2023-10-14: qty 0.4

## 2023-10-14 MED ORDER — CEFAZOLIN SODIUM-DEXTROSE 2-4 GM/100ML-% IV SOLN
2.0000 g | INTRAVENOUS | Status: AC
  Administered 2023-10-14: 2 g via INTRAVENOUS
  Filled 2023-10-14: qty 100

## 2023-10-14 SURGICAL SUPPLY — 28 items
BAG COUNTER SPONGE SURGICOUNT (BAG) ×2 IMPLANT
BLADE SURG 15 STRL LF DISP TIS (BLADE) ×2 IMPLANT
CHLORAPREP W/TINT 26 (MISCELLANEOUS) ×2 IMPLANT
CLIP TI MEDIUM 6 (CLIP) ×4 IMPLANT
CLIP TI WIDE RED SMALL 6 (CLIP) ×4 IMPLANT
COVER SURGICAL LIGHT HANDLE (MISCELLANEOUS) ×2 IMPLANT
DERMABOND ADVANCED .7 DNX12 (GAUZE/BANDAGES/DRESSINGS) ×2 IMPLANT
DRAPE LAPAROTOMY T 98X78 PEDS (DRAPES) ×2 IMPLANT
DRAPE UTILITY XL STRL (DRAPES) ×2 IMPLANT
ELECT PENCIL ROCKER SW 15FT (MISCELLANEOUS) ×2 IMPLANT
ELECT REM PT RETURN 15FT ADLT (MISCELLANEOUS) ×2 IMPLANT
GAUZE 4X4 16PLY ~~LOC~~+RFID DBL (SPONGE) ×2 IMPLANT
GLOVE BIO SURGEON STRL SZ7 (GLOVE) ×2 IMPLANT
GLOVE BIOGEL PI IND STRL 7.5 (GLOVE) ×2 IMPLANT
GOWN STRL REUS W/ TWL XL LVL3 (GOWN DISPOSABLE) ×2 IMPLANT
HEMOSTAT SURGICEL 2X4 FIBR (HEMOSTASIS) ×2 IMPLANT
ILLUMINATOR WAVEGUIDE N/F (MISCELLANEOUS) ×2 IMPLANT
KIT BASIN OR (CUSTOM PROCEDURE TRAY) ×2 IMPLANT
KIT TURNOVER KIT A (KITS) ×2 IMPLANT
PACK BASIC VI WITH GOWN DISP (CUSTOM PROCEDURE TRAY) ×2 IMPLANT
PAD MAGNETIC INSTR ST 16X20 (MISCELLANEOUS) ×2 IMPLANT
SHEARS HARMONIC 9CM CVD (BLADE) ×2 IMPLANT
SUT MNCRL AB 4-0 PS2 18 (SUTURE) ×2 IMPLANT
SUT SILK 3 0 SH 30 (SUTURE) ×2 IMPLANT
SUT VIC AB 3-0 SH 18 (SUTURE) ×4 IMPLANT
SYR BULB IRRIG 60ML STRL (SYRINGE) ×2 IMPLANT
TOWEL OR 17X26 10 PK STRL BLUE (TOWEL DISPOSABLE) ×2 IMPLANT
TUBING CONNECTING 10 (TUBING) ×2 IMPLANT

## 2023-10-14 NOTE — Op Note (Signed)
 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 Imaging revealed additional nodules on the left but no evidence of lymphadenopathy. I recommended total thyroidectomy. All of his questions were addressed and written consent was obtained  Procedure Details: The patient was brought to the operating room and placed in a supine position on the operating room table. Following administration of general anesthesia, the patient was positioned and then prepped and draped in the usual aseptic fashion. After ascertaining that an adequate level of anesthesia had been achieved, a small Kocher incision was made with #15 blade. Dissection was carried through subcutaneous tissues and platysma.Hemostasis was achieved with the electrocautery. Skin flaps were elevated cephalad and caudad from the thyroid  notch to the sternal notch. A Self-retaining retractors was placed for exposure. Strap muscles were incised in the midline and dissection was begun on the right side.  Strap muscles were reflected laterally. The right thyroid  lobe was not particularly enlarged but there were distinct nodules and it was deep within the neck which made exposure difficult.The right lobe was gently mobilized with blunt dissection. Superior pole vessels were dissected out and divided individually between small and medium ligaclips with the harmonic scalpel. The thyroid  lobe was rolled anteriorly. Branches of the inferior thyroid  artery were divided between small ligaclips with the harmonic scalpel. Inferior venous tributaries were divided between ligaclips. Both the superior and inferior  parathyroid glands were identified and preserved on their vascular pedicles. The recurrent laryngeal nerve was not identified on this side but we did stay right on the lobe to avoid risk of injury. The ligament of Court was released with the electrocautery and the gland was mobilized onto the anterior trachea. Isthmus was mobilized across the midline. Dry pack was placed in the right neck.  The left thyroid  lobe was gently mobilized with blunt dissection. Left thyroid  lobe had nodules but was not particularly enlarged.  Superior pole vessels were dissected out and divided between small and medium ligaclips with the Harmonic scalpel. Superior parathyroid was identified and preserved. Inferior venous tributaries were divided between medium ligaclips with the harmonic scalpel. The left thyroid  lobe was rolled anteriorly and the branches of the inferior thyroid  artery divided between small ligaclips. The left recurrent laryngeal nerve was identified and preserved along its course. The ligament of Court was released with the electrocautery. The left thyroid  lobe was mobilized onto the anterior trachea and the remainder of the thyroid  was dissected off the anterior trachea and the thyroid  was completely excised. A suture was used to mark the right lobe. The entire thyroid  gland was submitted to pathology for review.  Palpation of the operative field demonstrated no evidence of residual disease and no abnormal lymph nodes.  The neck was irrigated with warm saline. Fibrillar was placed throughout the operative field. Strap muscles were approximated in the midline with interrupted 3-0 Vicryl sutures. Platysma was closed with interrupted 3-0 Vicryl sutures. Skin was closed with a running 4-0 Monocryl subcuticular suture. Wound was washed and Dermabond was applied. The patient was awakened from anesthesia and brought to the recovery room. The patient tolerated the procedure well.  Cordella Idler, MD [facility] Office: [phone]

## 2023-10-14 NOTE — Anesthesia Postprocedure Evaluation (Signed)
 Anesthesia Post Note  Patient: Ronald King  Procedure(s) Performed: THYROIDECTOMY (Neck)     Patient location during evaluation: PACU Anesthesia Type: General Level of consciousness: awake and alert Pain management: pain level controlled Vital Signs Assessment: post-procedure vital signs reviewed and stable Respiratory status: spontaneous breathing, nonlabored ventilation, respiratory function stable and patient connected to nasal cannula oxygen Cardiovascular status: blood pressure returned to baseline and stable Postop Assessment: no apparent nausea or vomiting Anesthetic complications: no   No notable events documented.  Last Vitals:  Vitals:   10/14/23 1100 10/14/23 1115  BP: (!) 150/88 (!) 147/84  Pulse: 96 95  Resp: 16 16  Temp:    SpO2: 99% 94%    Last Pain:  Vitals:   10/14/23 1115  TempSrc:   PainSc: 4                  Manolito Jurewicz

## 2023-10-14 NOTE — H&P (Signed)
 Ronald King 1945/10/21  [id number].    HPI:  78 y/o M with a right-sided papillary thyroid  cancer who presents for total thyroidectomy. He reports that he is in his usual state of health and denies any recent changes in medication.   ROS: ROS  Family History  Problem Relation Age of Onset   Cerebral aneurysm Father    Alcohol abuse Father    Heart disease Father        brain aneurism   Neuropathy Mother    Heart disease Mother        died at age 77   Migraines Brother        several   Heart disease Brother        heart attack   Diabetes Maternal Uncle    CAD Brother        CABG in his 30s   Colon cancer Neg Hx    Prostate cancer Neg Hx    Esophageal cancer Neg Hx    Stomach cancer Neg Hx    Rectal cancer Neg Hx     Past Medical History:  Diagnosis Date   Allergy     Anemia    Anxiety    Aortic stenosis    Blood transfusion without reported diagnosis    Cancer (HCC)    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   Heart murmur    Valve stenosis,  ECHO 01-2021   Hemorrhoids, internal    History of gastrointestinal hemorrhage    Hypertension    Hypogonadism, male    per Dr Faythe   Hypothyroidism    Neuromuscular disorder (HCC)    neuropathy feet   Neuropathy    per neuro-Dr Lester vicodin   OSA (obstructive sleep apnea)    on CPAP   Sleep apnea    CPAP at night   Thyroid  nodule    Vitamin D  deficiency     Past Surgical History:  Procedure Laterality Date   ANAL FISSURE REPAIR     BIOPSY THYROID      hand fracture surgery     left   KNEE ARTHROSCOPY Left 2007   Dr. Addie   LAPAROSCOPIC NISSEN FUNDOPLICATION  05/1999   Dr. Gladis   NOSE SURGERY     PROSTATE BIOPSY     TONSILLECTOMY     UPPER GASTROINTESTINAL ENDOSCOPY      Social  History:  reports that he quit smoking about 58 years ago. His smoking use included cigarettes. He started smoking about 61 years ago. He has a 3 pack-year smoking history. He has never used smokeless tobacco. He reports that he does not drink alcohol and does not use drugs.  Allergies:  Allergies  Allergen Reactions   Montelukast Shortness Of Breath   Ceftin [Cefuroxime Axetil] Other (See Comments)    Unknown reaction    Duloxetine     Other reaction(s): Other (See Comments) refuses   Lyrica [Pregabalin] Other (See Comments)    Drowsiness   Metformin Hcl Other (See Comments)    Unknown reaction   Neurontin [Gabapentin] Other (See Comments)    Drowsiness /affected memory   Testosterone  Other (See Comments)    HTN    Medications Prior to  Admission  Medication Sig Dispense Refill   amLODipine  (NORVASC ) 10 MG tablet Take 1 tablet (10 mg total) by mouth daily. 90 tablet 1   aspirin  EC 81 MG tablet Take 1 tablet (81 mg total) by mouth daily. Swallow whole.     Bioflavonoid Products (VITAMIN C) CHEW Chew 1 each by mouth in the morning.     budesonide  (PULMICORT ) 0.5 MG/2ML nebulizer solution Take 2 mLs (0.5 mg total) by nebulization 2 (two) times daily as needed (shortness of breath, wheezing, cough). 75 mL 5   Calcium -Magnesium  (CAL-MAG PO) Take 1 capsule by mouth in the morning.     Cholecalciferol (VITAMIN D3 PO) Take 1,000 Units by mouth in the morning.     clonazePAM  (KLONOPIN ) 1 MG tablet TAKE 1 TABLET(1 MG) BY MOUTH TWICE DAILY (Patient taking differently: Take 1 mg by mouth 2 (two) times daily.) 60 tablet 2   Coenzyme Q10-Vitamin E (QUNOL ULTRA COQ10 PO) Take 1 capsule by mouth in the morning.     cyanocobalamin (VITAMIN B12) 1000 MCG tablet Take 1,000 mcg by mouth in the morning.     Dulaglutide (TRULICITY) 0.75 MG/0.5ML SOPN Inject 0.75 mg into the skin every Thursday.     fenofibrate  (TRICOR ) 145 MG tablet Take 1 tablet (145 mg total) by mouth daily. 90 tablet 1    lansoprazole  (PREVACID ) 30 MG capsule TAKE 1 CAPSULE(30 MG) BY MOUTH TWICE DAILY BEFORE A MEAL (Patient taking differently: Take 30 mg by mouth See admin instructions. Take 1 capsule (30 mg) by mouth scheduled every morning, may repeat dose (30 mg) in the afternoon/evening--if needed for acid reflux/indigestion.) 120 capsule 0   Lysine 500 MG CAPS Take 500 mg by mouth in the morning.     Multiple Vitamin (MULTIVITAMIN WITH MINERALS) TABS tablet Take 1 tablet by mouth daily. Centrum Silver for Men 50+     NUBEQA 300 MG tablet Take 600 mg by mouth 2 (two) times daily.     oxymetazoline (AFRIN) 0.05 % nasal spray Place 1 spray into both nostrils 2 (two) times daily as needed for congestion.     PARoxetine  (PAXIL ) 20 MG tablet Take 1 tablet (20 mg total) by mouth daily. 90 tablet 1   phenazopyridine (PYRIDIUM) 97 MG tablet Take 97 mg by mouth 2 (two) times daily as needed for pain.     pyridOXINE (VITAMIN B-6) 100 MG tablet Take 100 mg by mouth daily.     rosuvastatin  (CRESTOR ) 40 MG tablet Take 1 tablet (40 mg total) by mouth at bedtime. 90 tablet 1   leuprolide , 6 Month, (ELIGARD ) 45 MG injection Inject 45 mg into the skin every 6 (six) months.      Physical Exam: Blood pressure (!) 140/94, pulse 78, temperature 98.8 F (37.1 C), temperature source Oral, resp. rate 18, height 6' 3 (1.905 m), weight 113.9 kg, SpO2 95%. Gen: male, NAD HEENT: trachea midline, no scars/lesion  Results for orders placed or performed during the hospital encounter of 10/14/23 (from the past 48 hours)  Glucose, capillary     Status: Abnormal   Collection Time: 10/14/23  6:16 AM  Result Value Ref Range   Glucose-Capillary 123 (H) 70 - 99 mg/dL    Comment: Glucose reference range applies only to samples taken after fasting for at least 8 hours.   Comment 1 Notify RN    Comment 2 Document in Chart    No results found.  Assessment/Plan 78 y/o M w/ papillary thyroid  cancer   - Will proceed  to the OR. We discussed  the alternatives and potential risks of surgery, including but not limited to: bleeding, infection, damage to the recurrent laryngeal nerve, need for tracheostomy, damage to the parathyroid glands/hypocalcemia, and need for additional procedures. All questions were addressed and consent was obtained.    Ronald King Surgery 10/14/2023, 6:57 AM Please see Amion for pager number during day hours 7:00am-4:30pm or 7:00am -11:30am on weekends

## 2023-10-14 NOTE — Discharge Instructions (Addendum)
 After Thyroid  Surgery Home Care Instruction  Activity  The effects of anesthesia are still present and drowsiness may result.  Limit activity for the first 24 hours, then you may return to normal daily activities. Returning to normal daily activities as soon as you can following surgery will enhance recovery time.  Do not drive or operate heavy machinery within 24 hours of taking narcotic pain medications.   Do not mow the lawn, use a vacuum cleaner, or do any other strenuous activities without first consulting your surgical team.   Diet Drink plenty of fluids and eat light meals today, then resume regular diet. Some patients may find their appetite is poor for a week or two after surgery. This is a normal result of the stress of surgery-your appetite will return in time.   There are no specific diet restrictions after surgery.   Dressing and Wound Care  Keep your wound or incision site clean and dry.  You can apply ice to the area for the first 1-2 days to help with swelling. You may have different types of dressings covering your incisions depending on your operation and your surgeon: Dermabond/Durabond (skin glue): This will usually remain in place for 10-14 days, then naturally fall off your skin. You may take a shower 24 hrs after surgery, carefully wash, not scrub the incision site with a mild non-scented soap. Pat dry with a soft towel.  Do not pick or peel skin glue off.  You can shower and let the water  fall on the dressings above. Do not soak or submerge your incision(s) in a bath tub, hot tub, or swimming pool, until your doctor says it is ok to do so or the incision(s) have completely healed, usually about 2-4 weeks.  Do not use creams, powder, salves or balms on your incision(s).  What to Expect After Surgery   Moderate discomfort controlled with medications  Minimal drainage from incision  Feeling fatigue and weak  Constipation after surgery is common. Drink plenty fluids and  eat a high fiber diet.   Pain Control: Prescribed Non-Narcotic Pain Medication  You will be given three prescriptions.  Two of them will be for prescription strength ibuprofen (i.e. Advil) and prescription strength acetaminophen  (i.e. Tylenol ).  The vast majority of patients will just need these two medications.  One prescription will be for a 'rescue' prescription of an oral narcotic (oxycodone ).  You may fill this if needed.  You will alternate taking the ibuprofen (600mg ) every 6 hours and also the acetaminophen  (650mg ) every 6 hours so that you are taking one of those medications every 3 hours.  For example: o 0800 - take ibuprofen 600mg  o 1100 - take acetaminophen  650mg  o 1400 - take ibuprofen 600mg  o 1700 - take acetaminophen  650mg  o Etc.  Continue taking this alternating pattern of ibuprofen and acetaminophen  for 3 days  If you cannot take one or the other of these medications, just take the one you can every 6 hours.  If you are comfortable at night, you don't have to wake up and take a medication.  If you are still uncomfortable after taking either ibuprofen or acetaminophen , try gentle stretching exercise and ice packs (a bag of frozen vegetables works great).  If you are still uncomfortable, you may fill the narcotic prescription of Oxycodone  and take as directed.  Once you have completed these prescriptions, your pain level should be low enough to stop taking medications altogether or just use an over the counter medication (ibuprofen  or acetaminophen ) as needed.    Pain Control: Over the Counter Medications to take as needed  Colace/Docusate: May be prescribed by your surgeon to prevent constipation caused by the combination of narcotics, effects of anesthesia, and decreased ambulation.  Hold for loose stools or diarrhea. Take 100 mg 1-2 times a day starting tonight.   Fiber: High fiber foods, extra liquids (water  9-13 cups/day) can also assist with constipation. Examples of high  fiber foods are fruit, bran. Prune juice and water  are also good liquids to drink.  Milk of Magnesia/Miralax:  If constipated despite takeing the over the counter stool softeners, you may take Milk of Magnesia or Miralax as directed on bottle to assist with constipation.     Pepcid/Famotidine: May be prescribed while taking naproxen (Aleve) or other NSAIDs such as ibuprofen (Motrin/Advil) to prevent stomach upset or Acid-reflux symptoms. Take 1 tablet 1-2 times a day.   **Constipation: The first bowel movement may occur anywhere between 1-5 days after surgery.  As long as you are not nauseated or not having significant abdominal pain this variation is acceptable. Narcotic pain medications can cause constipation increasing discomfort; early discontinuation will assist with bowel management. If constipated despite taking stool softeners, you may take Milk of Magnesia or Miralax as directed on the bottle.     **Home medications: You may restart your home medications as directed by your respective Primary Care Physician or Surgeon.   When to notify your Doctor or Healthcare Team   Sign of Wound Infection   Fever over 100 degrees.  Wound becomes extremely swollen, shows red streaks, warm to the touch, and/or drainage from the incision site or foul-smelling drainage.  Wound edges separate or opens up  Bleeding or bruising   If you have bleeding, apply pressure to the site and hold the pressure firmly for 5 minutes. If the bleeding continues, apply pressure again and call 911. If the bleeding stopped, call your doctor to report it.   Call your doctor or nurse if you have increased bleeding from your site and increased bruising or a lump forms or gets larger under your skin at the site. Unrelieved Pain   Call your doctor or nurse if your pain gets worse or is not eased 1 hour after taking your pain medicine, or if it is severe and uncontrolled. Nausea and Vomiting   Call your doctor or nurse if you have  nausea and vomiting that continues more than 24 hours, will not let you keep medicine down and will not let you keep fluids down  Fever, Flu-like symptoms   Fever over 100 degrees and/or chills  Gastrointestinal Bleeding Symptoms    Black tarry bowel movements.  This can be normal after surgery on the stomach, but should resolve in a day or two.    Call 911 if you suddenly have signs of blood loss such as:  Vomiting blood  Fast heart rate  Feeling faint, sweaty, or blacking out  Passing bright red blood from your rectum  Blood Clot Symptoms   Tender, swollen or reddened areas in your calf muscle or thighs.  Numbness or tingling in your lower leg or calf, or at the top of your leg or groin  Skin on your leg looks pale or blue or feels cold to touch  Chest pain or have trouble breathing, lightheadedness, fast heart rate  Sudden Onset of Symptoms    Call 911 if you suddenly have:  Leg weakness and spasm  Loss of bladder  or bowel function  Seizure  Confusion, severe headache, dizziness or feeling unsteady, problems talking, difficulty swallowing, and/or numbness or muscle weakness as these could be signs of a stroke.  Follow up Appointment Your follow up appointment should be scheduled 2-3 weeks after your surgery date.  If you have not previously scheduled for a follow-up visit you can be scheduled by contacting [phone].

## 2023-10-14 NOTE — Anesthesia Procedure Notes (Signed)
 Procedure Name: Intubation Date/Time: 10/14/2023 7:48 AM  Performed by: Kathern Rollene LABOR, CRNAPre-anesthesia Checklist: Patient identified, Emergency Drugs available, Suction available and Patient being monitored Patient Re-evaluated:Patient Re-evaluated prior to induction Oxygen Delivery Method: Circle system utilized Preoxygenation: Pre-oxygenation with 100% oxygen Induction Type: IV induction Ventilation: Mask ventilation without difficulty Laryngoscope Size: Glidescope and 4 Grade View: Grade I Tube type: Oral Tube size: 7.5 mm Number of attempts: 1 Airway Equipment and Method: Stylet Placement Confirmation: ETT inserted through vocal cords under direct vision, positive ETCO2 and breath sounds checked- equal and bilateral Secured at: 24 cm Tube secured with: Tape Dental Injury: Teeth and Oropharynx as per pre-operative assessment  Difficulty Due To: Difficulty was anticipated

## 2023-10-14 NOTE — Transfer of Care (Signed)
 Immediate Anesthesia Transfer of Care Note  Patient: Ronald King  Procedure(s) Performed: THYROIDECTOMY (Neck)  Patient Location: PACU  Anesthesia Type:General  Level of Consciousness: oriented, drowsy, and patient cooperative  Airway & Oxygen Therapy: Patient Spontanous Breathing and Patient connected to face mask oxygen  Post-op Assessment: Report given to RN and Post -op Vital signs reviewed and stable  Post vital signs: Reviewed and stable  Last Vitals:  Vitals Value Taken Time  BP 154/89 10/14/23 10:50  Temp    Pulse 94 10/14/23 10:53  Resp    SpO2 99 % 10/14/23 10:53  Vitals shown include unfiled device data.  Last Pain:  Vitals:   10/14/23 0610  TempSrc: Oral         Complications: No notable events documented.

## 2023-10-15 ENCOUNTER — Telehealth (HOSPITAL_COMMUNITY): Payer: Self-pay | Admitting: Emergency Medicine

## 2023-10-15 ENCOUNTER — Encounter (HOSPITAL_COMMUNITY): Payer: Self-pay | Admitting: General Surgery

## 2023-10-15 DIAGNOSIS — Z7982 Long term (current) use of aspirin: Secondary | ICD-10-CM | POA: Diagnosis not present

## 2023-10-15 DIAGNOSIS — I10 Essential (primary) hypertension: Secondary | ICD-10-CM | POA: Diagnosis not present

## 2023-10-15 DIAGNOSIS — E119 Type 2 diabetes mellitus without complications: Secondary | ICD-10-CM | POA: Diagnosis not present

## 2023-10-15 DIAGNOSIS — C73 Malignant neoplasm of thyroid gland: Secondary | ICD-10-CM | POA: Diagnosis not present

## 2023-10-15 DIAGNOSIS — E039 Hypothyroidism, unspecified: Secondary | ICD-10-CM | POA: Diagnosis not present

## 2023-10-15 DIAGNOSIS — Z87891 Personal history of nicotine dependence: Secondary | ICD-10-CM | POA: Diagnosis not present

## 2023-10-15 LAB — BASIC METABOLIC PANEL WITH GFR
Anion gap: 9 (ref 5–15)
BUN: 20 mg/dL (ref 8–23)
CO2: 26 mmol/L (ref 22–32)
Calcium: 8.8 mg/dL — ABNORMAL LOW (ref 8.9–10.3)
Chloride: 101 mmol/L (ref 98–111)
Creatinine, Ser: 1.21 mg/dL (ref 0.61–1.24)
GFR, Estimated: >60 mL/min (ref >60)
Glucose, Bld: 146 mg/dL — ABNORMAL HIGH (ref 70–99)
Potassium: 3.8 mmol/L (ref 3.5–5.1)
Sodium: 136 mmol/L (ref 135–145)

## 2023-10-15 LAB — GLUCOSE, CAPILLARY: Glucose-Capillary: 148 mg/dL — ABNORMAL HIGH (ref 70–99)

## 2023-10-15 MED ORDER — OXYCODONE HCL 5 MG PO TABS: 5.0000 mg | ORAL_TABLET | Freq: Three times a day (TID) | ORAL | 0 refills | Status: AC | PRN

## 2023-10-15 MED ORDER — LEVOTHYROXINE SODIUM 112 MCG PO TABS: 168.0000 ug | ORAL_TABLET | Freq: Every day | ORAL | 3 refills | Status: AC

## 2023-10-15 MED ORDER — CALCIUM CARBONATE ANTACID 500 MG PO CHEW: 400.0000 mg | CHEWABLE_TABLET | Freq: Three times a day (TID) | ORAL | 1 refills | Status: AC

## 2023-10-15 MED ORDER — ACETAMINOPHEN 325 MG PO TABS: 650.0000 mg | ORAL_TABLET | Freq: Four times a day (QID) | ORAL | 0 refills | Status: AC

## 2023-10-15 MED ORDER — IBUPROFEN 200 MG PO TABS: 600.0000 mg | ORAL_TABLET | Freq: Four times a day (QID) | ORAL | 0 refills | Status: AC

## 2023-10-15 NOTE — Progress Notes (Signed)
 Discharge meds sent to [facility] in [place] by on call surgeon, by Dr Lyndel. This RN updated patient

## 2023-10-15 NOTE — TOC Initial Note (Signed)
 Transition of Care [facility]) - Initial/Assessment Note    Patient Details  Name: Ronald King MRN: [id number] Date of Birth: [birth year]  Transition of [facility] CM/SW Contact:    Ronald King, Ronald King Phone Number: 10/15/2023, 9:37 AM  Clinical Narrative:                 Spoke w/ pt and wife Ronald King [phone]) in room; pt says he lives at home; he plans to return at d/c; she will provide transportation; pt verified PCP/insurance; he denied SDOH risks; he has cpap; pt says he does not have HH services, or home oxygen; no TOC needs.  Expected Discharge Plan: Home/Self Care Barriers to Discharge: No Barriers Identified   Patient Goals and CMS Choice Patient states their goals for this hospitalization and ongoing recovery are:: home CMS Medicare.gov Compare Post Acute Care list provided to:: Patient        Expected Discharge Plan and Services   Discharge Planning Services: CM Consult   Living arrangements for the past 2 months: Single Family Home Expected Discharge Date: 10/15/23               DME Arranged: N/A DME Agency: NA       HH Arranged: NA HH Agency: NA        Prior Living Arrangements/Services Living arrangements for the past 2 months: Single Family Home Lives with:: Spouse Patient language and need for interpreter reviewed:: Yes Do you feel safe going back to the place where you live?: Yes      Need for Family Participation in Patient Care: Yes (Comment) Care giver support system in place?: Yes (comment) Current home services: DME (cpap) Criminal Activity/Legal Involvement Pertinent to Current Situation/Hospitalization: No - Comment as needed  Activities of Daily Living   ADL Screening (condition at time of admission) Independently performs ADLs?: Yes (appropriate for developmental age) Is the patient deaf or have difficulty hearing?: No Does the patient have difficulty seeing, even when wearing glasses/contacts?: No Does  the patient have difficulty concentrating, remembering, or making decisions?: No  Permission Sought/Granted Permission sought to share information with : Case Manager Permission granted to share information with : Yes, Verbal Permission Granted  Share Information with NAME: Case Manager     Permission granted to share info w Relationship: Ronald King (spouse) [phone]     Emotional Assessment Appearance:: Appears stated age Attitude/Demeanor/Rapport: Gracious Affect (typically observed): Accepting Orientation: : Oriented to Self, Oriented to Place, Oriented to  Time, Oriented to Situation Alcohol / Substance Use: Not Applicable Psych Involvement: No (comment)  Admission diagnosis:  Thyroid  nodule [E04.1] Thyroid  cancer [facility]) [C73] Patient Active Problem List   Diagnosis Date Noted   Thyroid  cancer (HCC) 10/14/2023   Atrial fibrillation (HCC) 09/08/2023   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   Pain in right hand 04/01/2023   PCP NOTES >>>>>>> 03/17/2015   Chronic cough 01/16/2015   Other malaise and fatigue 01/04/2013   Chronic sinusitis with recurrent bronchitis 06/15/2012   Annual physical exam 06/01/2012   Hyperglycemia 07/30/2011   Hypogonadism, male 11/26/2010   DYSPNEA 02/04/2010   VITAMIN D  DEFICIENCY 12/25/2008   Hyperlipidemia 12/25/2008   POLYPS, ESOPHAGEAL 09/16/2008   HEMORRHOIDS, INTERNAL 09/16/2008   ANEMIA, HX OF 09/16/2008   GASTROINTESTINAL HEMORRHAGE, HX OF 09/16/2008   Osteoarthritis 04/17/2008   Neuropathy-- f/u neuro, on hydrocodone  (by neuro) 07/22/2007   Obesity 07/05/2007   Anxiety state 07/05/2007   Obstructive sleep apnea  07/05/2007   Essential hypertension 07/05/2007   GERD 07/05/2007   IRRITABLE BOWEL SYNDROME 07/05/2007   HEADACHE 07/05/2007   PCP:  Ronald Aloysius BRAVO, MD Pharmacy:   [facility] [phone] - [address], [place] - [address] AT [facility] [address] [place]  [address] Phone: [phone] Fax: [phone]  [facility] [address] [place] - [address] AT [facility] [address] [place] [address] Phone: [phone] Fax: [phone]     Social Drivers of Health (SDOH) Social History: SDOH Screenings   Food Insecurity: No Food Insecurity (10/15/2023)  Housing: Low Risk  (10/15/2023)  Transportation Needs: No Transportation Needs (10/15/2023)  Utilities: Not At Risk (10/15/2023)  Alcohol Screen: Low Risk  (09/01/2023)  Depression (PHQ2-9): Low Risk  (09/07/2023)  Financial Resource Strain: Low Risk  (09/01/2023)  Physical Activity: Inactive (09/01/2023)  Social Connections: Moderately Integrated (10/14/2023)  Stress: Stress Concern Present (09/01/2023)  Tobacco Use: Medium Risk (10/14/2023)  Health Literacy: Adequate Health Literacy (09/01/2023)   SDOH Interventions: Food Insecurity Interventions: Intervention Not Indicated, Inpatient TOC Housing Interventions: Intervention Not Indicated, Inpatient TOC Transportation Interventions: Intervention Not Indicated, Inpatient TOC Utilities Interventions: Intervention Not Indicated, Inpatient TOC   Readmission Risk Interventions     No data to display

## 2023-10-15 NOTE — Progress Notes (Signed)
 Assessment unchanged. Pt and wife verbalized understanding of dc instructions including meds to resume, follow up care and when to call to doctor. Discharged via wc to front entrance accompanied by NT and wife.

## 2023-10-15 NOTE — Care Management Obs Status (Signed)
 MEDICARE OBSERVATION STATUS NOTIFICATION   Patient Details  Name: Ronald King MRN: [id number] Date of Birth: 1945/07/03   Medicare Observation Status Notification Given:  Yes    Sonda Manuella Quill, RN 10/15/2023, 9:34 AM

## 2023-10-15 NOTE — Plan of Care (Signed)
 Problem: Education: Goal: Knowledge of General Education information will improve Description: Including pain rating scale, medication(s)/side effects and non-pharmacologic comfort measures Outcome: Adequate for Discharge   Problem: Health Behavior/Discharge Planning: Goal: Ability to manage health-related needs will improve Outcome: Adequate for Discharge   Problem: Clinical Measurements: Goal: Ability to maintain clinical measurements within normal limits will improve Outcome: Adequate for Discharge Goal: Will remain free from infection Outcome: Adequate for Discharge Goal: Diagnostic test results will improve Outcome: Adequate for Discharge Goal: Respiratory complications will improve Outcome: Adequate for Discharge Goal: Cardiovascular complication will be avoided Outcome: Adequate for Discharge   Problem: Activity: Goal: Risk for activity intolerance will decrease Outcome: Adequate for Discharge   Problem: Nutrition: Goal: Adequate nutrition will be maintained Outcome: Adequate for Discharge   Problem: Coping: Goal: Level of anxiety will decrease Outcome: Adequate for Discharge   Problem: Elimination: Goal: Will not experience complications related to bowel motility Outcome: Adequate for Discharge Goal: Will not experience complications related to urinary retention Outcome: Adequate for Discharge   Problem: Pain Managment: Goal: General experience of comfort will improve and/or be controlled Outcome: Adequate for Discharge   Problem: Safety: Goal: Ability to remain free from injury will improve Outcome: Adequate for Discharge   Problem: Skin Integrity: Goal: Risk for impaired skin integrity will decrease Outcome: Adequate for Discharge   Problem: Education: Goal: Ability to describe self-care measures that may prevent or decrease complications (Diabetes Survival Skills Education) will improve Outcome: Adequate for Discharge Goal: Individualized Educational  Video(s) Outcome: Adequate for Discharge   Problem: Coping: Goal: Ability to adjust to condition or change in health will improve Outcome: Adequate for Discharge   Problem: Fluid Volume: Goal: Ability to maintain a balanced intake and output will improve Outcome: Adequate for Discharge   Problem: Health Behavior/Discharge Planning: Goal: Ability to identify and utilize available resources and services will improve Outcome: Adequate for Discharge Goal: Ability to manage health-related needs will improve Outcome: Adequate for Discharge   Problem: Metabolic: Goal: Ability to maintain appropriate glucose levels will improve Outcome: Adequate for Discharge   Problem: Nutritional: Goal: Maintenance of adequate nutrition will improve Outcome: Adequate for Discharge Goal: Progress toward achieving an optimal weight will improve Outcome: Adequate for Discharge   Problem: Skin Integrity: Goal: Risk for impaired skin integrity will decrease Outcome: Adequate for Discharge   Problem: Tissue Perfusion: Goal: Adequacy of tissue perfusion will improve Outcome: Adequate for Discharge   Problem: Education: Goal: Knowledge of General Education information will improve Description: Including pain rating scale, medication(s)/side effects and non-pharmacologic comfort measures Outcome: Adequate for Discharge   Problem: Health Behavior/Discharge Planning: Goal: Ability to manage health-related needs will improve Outcome: Adequate for Discharge   Problem: Clinical Measurements: Goal: Ability to maintain clinical measurements within normal limits will improve Outcome: Adequate for Discharge Goal: Will remain free from infection Outcome: Adequate for Discharge Goal: Diagnostic test results will improve Outcome: Adequate for Discharge Goal: Respiratory complications will improve Outcome: Adequate for Discharge Goal: Cardiovascular complication will be avoided Outcome: Adequate for  Discharge   Problem: Activity: Goal: Risk for activity intolerance will decrease Outcome: Adequate for Discharge   Problem: Nutrition: Goal: Adequate nutrition will be maintained Outcome: Adequate for Discharge   Problem: Coping: Goal: Level of anxiety will decrease Outcome: Adequate for Discharge   Problem: Elimination: Goal: Will not experience complications related to bowel motility Outcome: Adequate for Discharge Goal: Will not experience complications related to urinary retention Outcome: Adequate for Discharge   Problem: Pain Managment: Goal: General  experience of comfort will improve and/or be controlled Outcome: Adequate for Discharge   Problem: Safety: Goal: Ability to remain free from injury will improve Outcome: Adequate for Discharge   Problem: Skin Integrity: Goal: Risk for impaired skin integrity will decrease Outcome: Adequate for Discharge

## 2023-10-15 NOTE — Telephone Encounter (Signed)
 Patient was unable to [facility] due to discharge meds being filled at another pharmacy yesterday and that pharmacy is closed today. This RN spoke with Haematologist in [place]. The pharmacist who said she was able to pull over all of the meds from the [facility] on [address] to fill. OTC meds (tylenol , ibuprofen , TUMS) will be made available and levothyroxine  will be filled. Pharamcist will contact [place] on Monday to return meds pulled. [facility] see if [facility]codone  can be filled with [facility] or cash price- only cash price available per pharmacist. Unable to use insurance to fill prescription until original [facility] returns med on Monday. [facility]. Call back to [facility] by this RN to update him on where to pick up medications and explain why medications were not filled. Rick verbalixzed an understanding that he would [facility] ($13.99) & would pick up other meds at [facility]. No other questions at this time

## 2023-10-15 NOTE — Progress Notes (Signed)
 1 Day Post-Op   Subjective/Chief Complaint: Pain ok No n/v Neck a little sore Eating ok Ambulating No perioral numbness/tingling   Objective: Vital signs in last 24 hours: Temp:  [97.8 F (36.6 C)-98.5 F (36.9 C)] 98.5 F (36.9 C) (07/19 0536) Pulse Rate:  [69-99] 83 (07/19 0536) Resp:  [15-18] 16 (07/19 0536) BP: (128-154)/(77-93) 128/80 (07/19 0536) SpO2:  [93 %-99 %] 94 % (07/19 0536) Last BM Date : 10/13/23  Intake/Output from previous day: 07/18 0701 - 07/19 0700 In: 1750 [P.O.:450; I.V.:1200; IV Piggyback:100] Out: 850 [Urine:750; Blood:100] Intake/Output this shift: No intake/output data recorded.  Alert, nontoxic Voice ok Incision c/d/I; no hematoma but does have some bruising on his right neck -lateral part of incision  Lab Results:  Recent Labs    10/14/23 1109  WBC 6.5  HGB 13.7  HCT 39.7  PLT 179   BMET Recent Labs    10/14/23 1109 10/15/23 0740  NA  --  136  K  --  3.8  CL  --  101  CO2  --  26  GLUCOSE  --  146*  BUN  --  20  CREATININE 1.06 1.21  CALCIUM   --  8.8*   PT/INR No results for input(s): LABPROT, INR in the last 72 hours. ABG No results for input(s): PHART, HCO3 in the last 72 hours.  Invalid input(s): PCO2, PO2  Studies/Results: No results found.  Anti-infectives: Anti-infectives (From admission, onward)    Start     Dose/Rate Route Frequency Ordered Stop   10/14/23 0600  ceFAZolin  (ANCEF ) IVPB 2g/100 mL premix        2 g 200 mL/hr over 30 Minutes Intravenous On call to O.R. 10/14/23 0534 10/14/23 0752       Assessment/Plan: s/p Procedure(s) with comments: THYROIDECTOMY (N/A) - TOTAL THYROIDECTOMY  Doing well Vitals ok Ca 8.8 Ok for dc  Reviewed dc instructions.  LOS: 0 days    Camellia Blush 10/15/2023

## 2023-10-15 NOTE — Discharge Summary (Signed)
 Physician Discharge Summary  Patient ID: Ronald King MRN: [id number] DOB/AGE: 07-12-1945 78 y.o.  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 He underwent an uneventful total thyroidectomy and was kept overnight for observation. On POD 1 he discharged to home. At the time of discharge he was ambulating at baseline, his pain was well controlled, and his neck incision was without signs of hematoma.   Discharge Exam: Blood pressure 134/77, pulse 78, temperature (!) 97.4 F (36.3 C), temperature source Oral, resp. rate 16, height 6' 3 (1.905 m), weight 113.9 kg, SpO2 95%. See progress note  Disposition: Discharge disposition: 01-Home or Self Care       Discharge Instructions     Call MD for:   Complete by: As directed    Temperature >101   Call MD for:  hives   Complete by: As directed    Call MD for:  persistant dizziness or light-headedness   Complete by: As directed    Call MD for:  persistant nausea and vomiting   Complete by: As directed    Call MD for:  redness, tenderness, or signs of infection (pain, swelling, redness, odor or green/yellow discharge around incision site)   Complete by: As directed    Call MD for:  severe uncontrolled pain   Complete by: As directed    Diet Carb Modified   Complete by: As directed    Discharge instructions   Complete by: As directed    See [facility] discharge instructions   Increase activity slowly   Complete by: As directed       Allergies as of 10/15/2023       Reactions   Montelukast Shortness Of Breath   Ceftin [cefuroxime Axetil] Other (See Comments)   Unknown reaction   Duloxetine    Other reaction(s): Other (See Comments) refuses   Lyrica [pregabalin] Other (See Comments)   Drowsiness   Metformin Hcl Other (See  Comments)   Unknown reaction   Neurontin  [gabapentin ] Other (See Comments)   Drowsiness /affected memory   Testosterone  Other (See Comments)   HTN        Medication List     TAKE these medications    acetaminophen  325 MG tablet Commonly known as: Tylenol  Take 2 tablets (650 mg total) by mouth every 6 (six) hours for 6 days.   amLODipine  10 MG tablet Commonly known as: NORVASC  Take 1 tablet (10 mg total) by mouth daily.   aspirin  EC 81 MG tablet Take 1 tablet (81 mg total) by mouth daily. Swallow whole.   budesonide  0.5 MG/2ML nebulizer solution Commonly known as: Pulmicort  Take 2 mLs (0.5 mg total) by nebulization 2 (two) times daily as needed (shortness of breath, wheezing, cough).   CAL-MAG PO Take 1 capsule by mouth in the morning.   calcium  carbonate 500 MG chewable tablet Commonly known as: TUMS - dosed in mg elemental calcium  Chew 2 tablets (400 mg of elemental calcium  total) by mouth 3 (three) times daily.   clonazePAM  1 MG tablet Commonly known as: KLONOPIN  TAKE 1 TABLET(1 MG) BY MOUTH TWICE DAILY What changed: See the new instructions.   cyanocobalamin 1000 MCG tablet Commonly known as: VITAMIN B12 Take 1,000 mcg by mouth in the morning.   fenofibrate  145 MG tablet Commonly known as: TRICOR  Take 1 tablet (145 mg  total) by mouth daily.   ibuprofen  200 MG tablet Commonly known as: Motrin  IB Take 3 tablets (600 mg total) by mouth every 6 (six) hours for 6 days.   lansoprazole  30 MG capsule Commonly known as: PREVACID  TAKE 1 CAPSULE(30 MG) BY MOUTH TWICE DAILY BEFORE A MEAL What changed: See the new instructions.   leuprolide  (6 Month) 45 MG injection Commonly known as: ELIGARD  Inject 45 mg into the skin every 6 (six) months.   levothyroxine  112 MCG tablet Commonly known as: SYNTHROID  Take 1.5 tablets (168 mcg total) by mouth daily at 6 (six) AM.   Lysine 500 MG Caps Take 500 mg by mouth in the morning.   multivitamin with minerals Tabs  tablet Take 1 tablet by mouth daily. Centrum Silver for Men 50+   Nubeqa 300 MG tablet Generic drug: darolutamide Take 600 mg by mouth 2 (two) times daily.   oxyCODONE  5 MG immediate release tablet Commonly known as: Roxicodone  Take 1 tablet (5 mg total) by mouth every 8 (eight) hours as needed for up to 4 days.   oxymetazoline 0.05 % nasal spray Commonly known as: AFRIN Place 1 spray into both nostrils 2 (two) times daily as needed for congestion.   PARoxetine  20 MG tablet Commonly known as: PAXIL  Take 1 tablet (20 mg total) by mouth daily.   phenazopyridine 97 MG tablet Commonly known as: PYRIDIUM Take 97 mg by mouth 2 (two) times daily as needed for pain.   pyridOXINE 100 MG tablet Commonly known as: VITAMIN B6 Take 100 mg by mouth daily.   QUNOL ULTRA COQ10 PO Take 1 capsule by mouth in the morning.   rosuvastatin  40 MG tablet Commonly known as: CRESTOR  Take 1 tablet (40 mg total) by mouth at bedtime.   Trulicity 0.75 MG/0.5ML Soaj Generic drug: Dulaglutide Inject 0.75 mg into the skin every Thursday.   Vitamin C Chew Chew 1 each by mouth in the morning.   VITAMIN D3 PO Take 1,000 Units by mouth in the morning.         Signed: Cordella DELENA Idler 10/15/2023, 8:04 PM

## 2023-10-15 NOTE — Progress Notes (Signed)
 AVS reviewed w/ patient and wife who verbalized an understanding. PIV removed as noted. [facility] [place] There is no pharmacist at his pharmacy today or Sunday. Secure chat sent to Dr Polly for change. Pt dressed for d/c to home.

## 2023-10-15 NOTE — Plan of Care (Signed)
   Problem: Education: Goal: Knowledge of General Education information will improve Description Including pain rating scale, medication(s)/side effects and non-pharmacologic comfort measures Outcome: Progressing   Problem: Health Behavior/Discharge Planning: Goal: Ability to manage health-related needs will improve Outcome: Progressing

## 2023-10-17 LAB — SURGICAL PATHOLOGY

## 2023-10-23 ENCOUNTER — Other Ambulatory Visit: Payer: Self-pay | Admitting: Internal Medicine

## 2023-10-28 NOTE — Progress Notes (Signed)
 HPI: Follow-up atrial fibrillation and AS.  Found to be in atrial fibrillation on April 28 ECG which was a new diagnosis.  Echocardiogram May 2025 showed normal LV function, mild left ventricular hypertrophy, mild aortic stenosis with mean gradient 16.5 mmHg.  Since last seen patient underwent successful thyroidectomy 3 weeks ago.  He is scheduled to have radiation seed implants for his prostate cancer on August 15.  He denies dyspnea, chest pain, palpitations or syncope.  Current Outpatient Medications  Medication Sig Dispense Refill   amLODipine  (NORVASC ) 10 MG tablet TAKE 1 TABLET(10 MG) BY MOUTH DAILY 90 tablet 1   aspirin  EC 81 MG tablet Take 1 tablet (81 mg total) by mouth daily. Swallow whole.     Bioflavonoid Products (VITAMIN C) CHEW Chew 1 each by mouth in the morning.     GEMTESA 75 MG TABS Take 1 tablet by mouth daily.     lansoprazole  (PREVACID ) 30 MG capsule TAKE 1 CAPSULE(30 MG) BY MOUTH TWICE DAILY BEFORE A MEAL (Patient taking differently: Take 30 mg by mouth See admin instructions. Take 1 capsule (30 mg) by mouth scheduled every morning, may repeat dose (30 mg) in the afternoon/evening--if needed for acid reflux/indigestion.) 120 capsule 0   leuprolide , 6 Month, (ELIGARD ) 45 MG injection Inject 45 mg into the skin every 6 (six) months.     Lysine 500 MG CAPS Take 500 mg by mouth in the morning.     Multiple Vitamin (MULTIVITAMIN WITH MINERALS) TABS tablet Take 1 tablet by mouth daily. Centrum Silver for Men 50+     NUBEQA 300 MG tablet Take 600 mg by mouth 2 (two) times daily.     oxymetazoline (AFRIN) 0.05 % nasal spray Place 1 spray into both nostrils 2 (two) times daily as needed for congestion.     PARoxetine  (PAXIL ) 20 MG tablet TAKE 1 TABLET(20 MG) BY MOUTH DAILY 90 tablet 1   pyridOXINE (VITAMIN B-6) 100 MG tablet Take 100 mg by mouth daily.     rosuvastatin  (CRESTOR ) 40 MG tablet Take 1 tablet (40 mg total) by mouth at bedtime. 90 tablet 1   tamsulosin (FLOMAX) 0.4  MG CAPS capsule Take 0.4 mg by mouth at bedtime.     budesonide  (PULMICORT ) 0.5 MG/2ML nebulizer solution Take 2 mLs (0.5 mg total) by nebulization 2 (two) times daily as needed (shortness of breath, wheezing, cough). (Patient not taking: Reported on 11/09/2023) 75 mL 5   calcium  carbonate (TUMS - DOSED IN MG ELEMENTAL CALCIUM ) 500 MG chewable tablet Chew 2 tablets (400 mg of elemental calcium  total) by mouth 3 (three) times daily. 180 tablet 1   Calcium -Magnesium  (CAL-MAG PO) Take 1 capsule by mouth in the morning.     Cholecalciferol (VITAMIN D3 PO) Take 1,000 Units by mouth in the morning.     clonazePAM  (KLONOPIN ) 1 MG tablet TAKE 1 TABLET(1 MG) BY MOUTH TWICE DAILY (Patient taking differently: Take 1 mg by mouth 2 (two) times daily.) 60 tablet 2   Coenzyme Q10-Vitamin E (QUNOL ULTRA COQ10 PO) Take 1 capsule by mouth in the morning.     cyanocobalamin (VITAMIN B12) 1000 MCG tablet Take 1,000 mcg by mouth in the morning.     Dulaglutide (TRULICITY) 0.75 MG/0.5ML SOPN Inject 0.75 mg into the skin every Thursday.     fenofibrate  (TRICOR ) 145 MG tablet Take 1 tablet (145 mg total) by mouth daily. 90 tablet 1   levothyroxine  (SYNTHROID ) 112 MCG tablet Take 1.5 tablets (168 mcg total) by  mouth daily at 6 (six) AM. 45 tablet 3   phenazopyridine (PYRIDIUM) 97 MG tablet Take 97 mg by mouth 2 (two) times daily as needed for pain. (Patient not taking: Reported on 11/09/2023)     No current facility-administered medications for this visit.     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   Heart murmur    Valve stenosis,  ECHO 01-2021   Hemorrhoids,  internal    History of gastrointestinal hemorrhage    Hypertension    Hypogonadism, male    per Dr Faythe   Hypothyroidism    Neuromuscular disorder (HCC)    neuropathy feet   Neuropathy    per neuro-Dr Lester vicodin   OSA (obstructive sleep apnea)    on CPAP   Sleep apnea    CPAP at night   Thyroid  nodule    Vitamin D  deficiency     Past Surgical History:  Procedure Laterality Date   ANAL FISSURE REPAIR     BIOPSY THYROID      hand fracture surgery     left   KNEE ARTHROSCOPY Left 2007   Dr. Addie   LAPAROSCOPIC NISSEN FUNDOPLICATION  05/1999   Dr. Gladis   NOSE SURGERY     PROSTATE BIOPSY     THYROIDECTOMY N/A 10/14/2023   Procedure: THYROIDECTOMY;  Surgeon: Polly Cordella LABOR, MD;  Location: [facility];  Service: General;  Laterality: N/A;  TOTAL THYROIDECTOMY   TONSILLECTOMY     UPPER GASTROINTESTINAL ENDOSCOPY      Social History   Socioeconomic History   Marital status: Married    Spouse name: Not on file   Number of children: 1   Years of education: Not on file   Highest education level: 12th grade  Occupational History   Occupation: Regulatory affairs officer, retired    Comment: retired   Occupation: minister    Comment: [employer]  Tobacco Use   Smoking status: Former    Current packs/day: 0.00    Average packs/day: 1 pack/day for 3.0 years (3.0 ttl pk-yrs)    Types: Cigarettes    Start date: 03/29/1962    Quit date: 03/29/1965    Years since quitting: 58.6   Smokeless tobacco: Never  Vaping Use   Vaping status: Never Used  Substance and Sexual Activity   Alcohol use: No   Drug use: No   Sexual activity: Not Currently  Other Topics Concern   Not on file  Social History Narrative   Lives w/ wife   Social Drivers of Health   Financial Resource Strain: Low Risk  (09/01/2023)   Overall Financial Resource Strain (CARDIA)    Difficulty of Paying Living Expenses: Not very hard  Food Insecurity: No Food Insecurity (10/15/2023)   Hunger Vital  Sign    Worried About Running Out of Food in the Last Year: Never true    Ran Out of Food in the Last Year: Never true  Transportation Needs: No Transportation Needs (10/15/2023)   PRAPARE -  Administrator, Civil Service (Medical): No    Lack of Transportation (Non-Medical): No  Physical Activity: Inactive (09/01/2023)   Exercise Vital Sign    Days of Exercise per Week: 0 days    Minutes of Exercise per Session: 0 min  Stress: Stress Concern Present (09/01/2023)   [employer] of Occupational Health - Occupational Stress Questionnaire    Feeling of Stress : To some extent  Social Connections: Moderately Integrated (10/14/2023)   Social Connection and Isolation Panel    Frequency of Communication with Friends and Family: More than three times a week    Frequency of Social Gatherings with Friends and Family: More than three times a week    Attends Religious Services: More than 4 times per year    Active Member of [employer] or Organizations: No    Attends Banker Meetings: Never    Marital Status: Married  Catering manager Violence: Not At Risk (10/15/2023)   Humiliation, Afraid, Rape, and Kick questionnaire    Fear of Current or Ex-Partner: No    Emotionally Abused: No    Physically Abused: No    Sexually Abused: No    Family History  Problem Relation Age of Onset   Cerebral aneurysm Father    Alcohol abuse Father    Heart disease Father        brain aneurism   Neuropathy Mother    Heart disease Mother        died at age 1   Migraines Brother        several   Heart disease Brother        heart attack   Diabetes Maternal Uncle    CAD Brother        CABG in his 63s   Colon cancer Neg Hx    Prostate cancer Neg Hx    Esophageal cancer Neg Hx    Stomach cancer Neg Hx    Rectal cancer Neg Hx     ROS: no fevers or chills, productive cough, hemoptysis, dysphasia, odynophagia, melena, hematochezia, dysuria, hematuria, rash, seizure activity, orthopnea,  PND, pedal edema, claudication. Remaining systems are negative.  Physical Exam: Well-developed well-nourished in no acute distress.  Skin is warm and dry.  HEENT is normal.  Neck is supple.  Chest is clear to auscultation with normal expansion.  Cardiovascular exam is irregular Abdominal exam nontender or distended. No masses palpated. Extremities show no edema. neuro grossly intact  EKG Interpretation Date/Time:  Wednesday November 09 2023 12:28:45 EDT Ventricular Rate:  83 PR Interval:    QRS Duration:  82 QT Interval:  380 QTC Calculation: 446 R Axis:   14  Text Interpretation: Atrial fibrillation Confirmed by Pietro Rogue ([id number]) on 11/09/2023 12:29:42 PM    A/P  1 persistent atrial fibrillation-patient's heart rate is controlled on no medications.  Patient underwent thyroidectomy 3 weeks ago successfully.  He is scheduled to have radiation seed implants for prostate cancer on August 15.  I have asked him to discuss with radiation oncology and urology about timing of initiating Eliquis .  As soon as feasible I would like for him to begin 5 mg twice daily.  I will see him back in 8 weeks and we will likely arrange cardioversion at that time.  Note he is somewhat hesitant about taking anticoagulation as he had relative he had significant bleeding problems.  I explained that if he has any issues with bleeding we could refer for watchman in the future.  I will see him back in 8 weeks to make sure that the above is going well and this cardioversion is arranged.  2 aortic stenosis-mild on most recent echocardiogram.  Will repeat study May 2026.  3 hypertension-patient's blood pressure is controlled.  Continue present medical regimen.  4 Sleep apnea-continue CPAP.  5 hyperlipidemia-continue Crestor .  Redell Shallow, MD

## 2023-11-01 NOTE — Progress Notes (Signed)
 RN spoke with patient to assess any questions or barriers prior to start of radiation.  All questions answered, no additional needs at this time.

## 2023-11-02 ENCOUNTER — Encounter (HOSPITAL_COMMUNITY): Payer: Self-pay | Admitting: Urology

## 2023-11-02 DIAGNOSIS — C61 Malignant neoplasm of prostate: Secondary | ICD-10-CM | POA: Diagnosis not present

## 2023-11-02 NOTE — Progress Notes (Signed)
 Cardiac clearance by Dr Pietro dated 08/03/23 in Epic.   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 This was noted 09/13/23. On 10/14/23 Glidescope used with no issues noted.  Spoke w/ via phone for pre-op interview--- Ronald King needs dos----BMP, CBG and A1C per anesthesia.         King results------Current EKG in Epic dated 08/03/23,Echo 60-65%. COVID test -----patient states asymptomatic no test needed Arrive at -------0530 NPO after MN NO Solid Food.  Pre-Surgery Ensure or G2:  Med rec completed Medications to take morning of surgery ----- Norvasc , Clonazepam , prevacid , levothyroxine  and paxil . Diabetic medication ----- NONE Am of surgery.  GLP1 agonist last dose: Trulicity last dose will be 11/03/23. GLP1 instructions: No doses after 11/03/23 wait until after surgery. Patient verbalized understanding.  Patient instructed no nail polish to be worn day of surgery Patient instructed to bring photo id and insurance card day of surgery Patient aware to have Driver (ride ) / caregiver    for 24 hours after surgery - Ronald King Patient Special Instructions ----- Fleet enema per surgeons instructions. Hold ASA 5 days prior per surgeon. Pre-Op special Instructions -----  Patient verbalized understanding of instructions that were given at this phone interview. Patient denies chest pain, sob, fever, cough at the interview.

## 2023-11-03 ENCOUNTER — Telehealth: Payer: Self-pay | Admitting: *Deleted

## 2023-11-03 NOTE — Telephone Encounter (Signed)
 CALLED PATIENT TO REMIND  OF SIM APPT. FOR 11-14-23- ARRIVAL TIME- 10:45 AM @ [facility], INFORMED PATIENT TO ARRIVE WITH A FULL BLADDER, PATIENT TO ALSO HAVE MRI- ARRIVAL TIME- 12:45 PM @ [facility], SPOKE WITH PATIENT AND HE IS AWARE OF THESE APPTS. AND THE INSTRUCTIONS

## 2023-11-08 DIAGNOSIS — E039 Hypothyroidism, unspecified: Secondary | ICD-10-CM | POA: Diagnosis not present

## 2023-11-09 ENCOUNTER — Ambulatory Visit: Attending: Cardiology | Admitting: Cardiology

## 2023-11-09 ENCOUNTER — Encounter: Payer: Self-pay | Admitting: Cardiology

## 2023-11-09 VITALS — BP 138/76 | HR 83 | Ht 73.0 in | Wt 257.1 lb

## 2023-11-09 DIAGNOSIS — C61 Malignant neoplasm of prostate: Secondary | ICD-10-CM | POA: Diagnosis not present

## 2023-11-09 DIAGNOSIS — I350 Nonrheumatic aortic (valve) stenosis: Secondary | ICD-10-CM

## 2023-11-09 DIAGNOSIS — R351 Nocturia: Secondary | ICD-10-CM | POA: Diagnosis not present

## 2023-11-09 DIAGNOSIS — N401 Enlarged prostate with lower urinary tract symptoms: Secondary | ICD-10-CM | POA: Diagnosis not present

## 2023-11-09 DIAGNOSIS — I10 Essential (primary) hypertension: Secondary | ICD-10-CM | POA: Diagnosis not present

## 2023-11-09 DIAGNOSIS — C7951 Secondary malignant neoplasm of bone: Secondary | ICD-10-CM | POA: Diagnosis not present

## 2023-11-09 DIAGNOSIS — I4819 Other persistent atrial fibrillation: Secondary | ICD-10-CM

## 2023-11-09 MED ORDER — APIXABAN 5 MG PO TABS: 5.0000 mg | ORAL_TABLET | Freq: Two times a day (BID) | ORAL | 6 refills | Status: DC

## 2023-11-09 MED ORDER — APIXABAN 5 MG PO TABS: 5.0000 mg | ORAL_TABLET | Freq: Two times a day (BID) | ORAL | Status: AC

## 2023-11-09 NOTE — Addendum Note (Signed)
 Addended by: Nashaun Hillmer W on: 11/09/2023 01:04 PM   Modules accepted: Orders

## 2023-11-09 NOTE — Patient Instructions (Signed)
 Medication Instructions:   START ELIQUIS  5 MG ONE TABLET TWICE DAILY AFTER SURGERY  *If you need a refill on your cardiac medications before your next appointment, please call your pharmacy*   Follow-Up: At [facility], you and your health needs are our priority.  As part of our continuing mission to provide you with exceptional heart care, our providers are all part of one team.  This team includes your primary Cardiologist (physician) and Advanced Practice Providers or APPs (Physician Assistants and Nurse Practitioners) who all work together to provide you with the care you need, when you need it.  Your next appointment:   8 week(s)  Provider:   REDELL SHALLOW MD

## 2023-11-10 NOTE — Progress Notes (Signed)
  Radiation Oncology         [phone] ________________________________  Name: Ronald King MRN: [id number]  Date: 11/14/2023  DOB: [birth year]  SIMULATION AND TREATMENT PLANNING NOTE    ICD-10-CM   1. Malignant neoplasm of prostate metastatic to bone (HCC)  C61    C79.51       DIAGNOSIS:  79 y.o. 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   NARRATIVE:  The patient was brought to the CT Simulation planning suite.  Identity was confirmed.  All relevant records and images related to the planned course of therapy were reviewed.  The patient freely provided informed written consent to proceed with treatment after reviewing the details related to the planned course of therapy. The consent form was witnessed and verified by the simulation staff.  Then, the patient was set-up in a stable reproducible supine position for radiation therapy.  A vacuum lock pillow device was custom fabricated to position his legs in a reproducible immobilized position.  Then, I performed a urethrogram under sterile conditions to identify the prostatic apex.  CT images were obtained.  Surface markings were placed.  The CT images were loaded into the planning software.  Then the prostate target and avoidance structures including the rectum, bladder, bowel and hips were contoured.  Treatment planning then occurred.  The radiation prescription was entered and confirmed.  A total of one complex treatment devices was fabricated. I have requested : Intensity Modulated Radiotherapy (IMRT) is medically necessary for this case for the following reason:  Rectal sparing.SABRA  PLAN:   The prostate, seminal vesicles, and pelvic lymph nodes will initially be treated to 45 Gy in 25 fractions of 1.8 Gy followed by a boost to the prostate and PET positive pelvic skeletal and nodal metastases to 75 Gy with 15 additional fractions of  2.0 Gy; concurrent with ADT- started ADT with Orgovyx on 06/21/23.   ________________________________  Donnice LABOR. Patrcia, M.D.

## 2023-11-10 NOTE — Progress Notes (Signed)
 RN spoke with patient and provided education on next steps for fiducial's marker's and spaceOAR gel placement followed by CT Simulation and MR.   All questions answered, no additional needs at this time.

## 2023-11-11 ENCOUNTER — Ambulatory Visit (HOSPITAL_COMMUNITY): Admitting: Anesthesiology

## 2023-11-11 ENCOUNTER — Ambulatory Visit (HOSPITAL_BASED_OUTPATIENT_CLINIC_OR_DEPARTMENT_OTHER): Admitting: Anesthesiology

## 2023-11-11 ENCOUNTER — Ambulatory Visit (HOSPITAL_COMMUNITY)
Admission: RE | Admit: 2023-11-11 | Discharge: 2023-11-11 | Disposition: A | Discharge status: Home / Self Care | Attending: Urology | Admitting: Urology

## 2023-11-11 ENCOUNTER — Encounter (HOSPITAL_COMMUNITY): Payer: Self-pay | Admitting: Urology

## 2023-11-11 ENCOUNTER — Encounter (HOSPITAL_COMMUNITY)
Admission: RE | Disposition: A | Discharge status: Home / Self Care | Payer: Self-pay | Source: Home / Self Care | Attending: Urology

## 2023-11-11 DIAGNOSIS — E039 Hypothyroidism, unspecified: Secondary | ICD-10-CM

## 2023-11-11 DIAGNOSIS — R739 Hyperglycemia, unspecified: Secondary | ICD-10-CM

## 2023-11-11 DIAGNOSIS — I10 Essential (primary) hypertension: Secondary | ICD-10-CM

## 2023-11-11 DIAGNOSIS — N401 Enlarged prostate with lower urinary tract symptoms: Secondary | ICD-10-CM | POA: Diagnosis not present

## 2023-11-11 DIAGNOSIS — E119 Type 2 diabetes mellitus without complications: Secondary | ICD-10-CM | POA: Insufficient documentation

## 2023-11-11 DIAGNOSIS — G4733 Obstructive sleep apnea (adult) (pediatric): Secondary | ICD-10-CM | POA: Diagnosis not present

## 2023-11-11 DIAGNOSIS — C7951 Secondary malignant neoplasm of bone: Secondary | ICD-10-CM | POA: Diagnosis not present

## 2023-11-11 DIAGNOSIS — Z7985 Long-term (current) use of injectable non-insulin antidiabetic drugs: Secondary | ICD-10-CM | POA: Diagnosis not present

## 2023-11-11 DIAGNOSIS — Z87891 Personal history of nicotine dependence: Secondary | ICD-10-CM | POA: Insufficient documentation

## 2023-11-11 DIAGNOSIS — K219 Gastro-esophageal reflux disease without esophagitis: Secondary | ICD-10-CM | POA: Diagnosis not present

## 2023-11-11 DIAGNOSIS — Z7901 Long term (current) use of anticoagulants: Secondary | ICD-10-CM | POA: Insufficient documentation

## 2023-11-11 DIAGNOSIS — C61 Malignant neoplasm of prostate: Secondary | ICD-10-CM | POA: Diagnosis not present

## 2023-11-11 DIAGNOSIS — I4891 Unspecified atrial fibrillation: Secondary | ICD-10-CM | POA: Insufficient documentation

## 2023-11-11 HISTORY — PX: GOLD SEED IMPLANT: SHX6343

## 2023-11-11 HISTORY — PX: SPACE OAR INSTILLATION: SHX6769

## 2023-11-11 LAB — HEMOGLOBIN A1C
Hgb A1c MFr Bld: 6.2 % — ABNORMAL HIGH (ref 4.8–5.6)
Mean Plasma Glucose: 131.24 mg/dL

## 2023-11-11 LAB — GLUCOSE, CAPILLARY
Glucose-Capillary: 98 mg/dL (ref 70–99)
Glucose-Capillary: 105 mg/dL — ABNORMAL HIGH (ref 70–99)

## 2023-11-11 LAB — BASIC METABOLIC PANEL WITH GFR
Anion gap: 8 (ref 5–15)
BUN: 16 mg/dL (ref 8–23)
CO2: 25 mmol/L (ref 22–32)
Calcium: 9.1 mg/dL (ref 8.9–10.3)
Chloride: 109 mmol/L (ref 98–111)
Creatinine, Ser: 1.11 mg/dL (ref 0.61–1.24)
GFR, Estimated: >60 mL/min (ref >60)
Glucose, Bld: 106 mg/dL — ABNORMAL HIGH (ref 70–99)
Potassium: 3.8 mmol/L (ref 3.5–5.1)
Sodium: 142 mmol/L (ref 135–145)

## 2023-11-11 SURGERY — INSERTION, GOLD SEEDS
Anesthesia: General

## 2023-11-11 MED ORDER — MIDAZOLAM HCL 2 MG/2ML IJ SOLN
INTRAMUSCULAR | Status: AC
  Filled 2023-11-11: qty 2

## 2023-11-11 MED ORDER — PROPOFOL 10 MG/ML IV BOLUS
INTRAVENOUS | Status: AC
  Filled 2023-11-11: qty 20

## 2023-11-11 MED ORDER — MIDAZOLAM HCL 2 MG/2ML IJ SOLN
INTRAMUSCULAR | Status: DC | PRN
  Administered 2023-11-11: 2 mg via INTRAVENOUS

## 2023-11-11 MED ORDER — BUPIVACAINE HCL (PF) 0.25 % IJ SOLN
INTRAMUSCULAR | Status: DC | PRN
Start: 2023-11-11 — End: 2023-11-11
  Administered 2023-11-11: 10 mL

## 2023-11-11 MED ORDER — INSULIN ASPART 100 UNIT/ML IJ SOLN: 0.0000 [IU] | INTRAMUSCULAR | Status: DC | PRN

## 2023-11-11 MED ORDER — CHLORHEXIDINE GLUCONATE 0.12 % MT SOLN
15.0000 mL | Freq: Once | OROMUCOSAL | Status: AC
  Administered 2023-11-11: 15 mL via OROMUCOSAL

## 2023-11-11 MED ORDER — CELECOXIB 200 MG PO CAPS
ORAL_CAPSULE | ORAL | Status: AC
  Filled 2023-11-11: qty 1

## 2023-11-11 MED ORDER — ACETAMINOPHEN 500 MG PO TABS
1000.0000 mg | ORAL_TABLET | Freq: Once | ORAL | Status: AC
  Administered 2023-11-11: 1000 mg via ORAL

## 2023-11-11 MED ORDER — OXYCODONE HCL 5 MG PO TABS
5.0000 mg | ORAL_TABLET | ORAL | Status: DC | PRN
  Administered 2023-11-11: 5 mg via ORAL

## 2023-11-11 MED ORDER — FLEET ENEMA RE ENEM
1.0000 | ENEMA | Freq: Once | RECTAL | Status: DC
  Filled 2023-11-11: qty 1

## 2023-11-11 MED ORDER — DEXAMETHASONE SODIUM PHOSPHATE 10 MG/ML IJ SOLN
INTRAMUSCULAR | Status: AC
  Filled 2023-11-11: qty 1

## 2023-11-11 MED ORDER — ONDANSETRON HCL 4 MG/2ML IJ SOLN
INTRAMUSCULAR | Status: AC
Start: 2023-11-11 — End: 2023-11-11
  Filled 2023-11-11: qty 2

## 2023-11-11 MED ORDER — SODIUM CHLORIDE (PF) 0.9 % IJ SOLN
INTRAMUSCULAR | Status: DC | PRN
Start: 2023-11-11 — End: 2023-11-11
  Administered 2023-11-11: 6 mL via INTRAVENOUS

## 2023-11-11 MED ORDER — ORAL CARE MOUTH RINSE: 15.0000 mL | Freq: Once | OROMUCOSAL | Status: AC

## 2023-11-11 MED ORDER — FENTANYL CITRATE (PF) 100 MCG/2ML IJ SOLN: 25.0000 ug | INTRAMUSCULAR | Status: DC | PRN

## 2023-11-11 MED ORDER — OXYCODONE HCL 5 MG PO TABS
ORAL_TABLET | ORAL | Status: AC
  Filled 2023-11-11: qty 1

## 2023-11-11 MED ORDER — ACETAMINOPHEN 500 MG PO TABS
ORAL_TABLET | ORAL | Status: AC
  Filled 2023-11-11: qty 2

## 2023-11-11 MED ORDER — ONDANSETRON HCL 4 MG/2ML IJ SOLN
INTRAMUSCULAR | Status: DC | PRN
  Administered 2023-11-11: 4 mg via INTRAVENOUS

## 2023-11-11 MED ORDER — PHENYLEPHRINE 80 MCG/ML (10ML) SYRINGE FOR IV PUSH (FOR BLOOD PRESSURE SUPPORT)
PREFILLED_SYRINGE | INTRAVENOUS | Status: AC
  Filled 2023-11-11: qty 10

## 2023-11-11 MED ORDER — CELECOXIB 200 MG PO CAPS
200.0000 mg | ORAL_CAPSULE | Freq: Once | ORAL | Status: AC
  Administered 2023-11-11: 200 mg via ORAL

## 2023-11-11 MED ORDER — SODIUM CHLORIDE (PF) 0.9 % IJ SOLN
INTRAMUSCULAR | Status: AC
  Filled 2023-11-11: qty 10

## 2023-11-11 MED ORDER — PROPOFOL 10 MG/ML IV BOLUS
INTRAVENOUS | Status: DC | PRN
Start: 2023-11-11 — End: 2023-11-11
  Administered 2023-11-11 (×2): 100 mg via INTRAVENOUS

## 2023-11-11 MED ORDER — LIDOCAINE 2% (20 MG/ML) 5 ML SYRINGE
INTRAMUSCULAR | Status: AC
Start: 2023-11-11 — End: 2023-11-11
  Filled 2023-11-11: qty 5

## 2023-11-11 MED ORDER — LACTATED RINGERS IV SOLN: INTRAVENOUS | Status: DC

## 2023-11-11 MED ORDER — CHLORHEXIDINE GLUCONATE 0.12 % MT SOLN
OROMUCOSAL | Status: DC
Start: 2023-11-11 — End: 2023-11-11
  Filled 2023-11-11: qty 15

## 2023-11-11 MED ORDER — AMISULPRIDE (ANTIEMETIC) 5 MG/2ML IV SOLN: 10.0000 mg | Freq: Once | INTRAVENOUS | Status: DC | PRN

## 2023-11-11 MED ORDER — CEFAZOLIN SODIUM-DEXTROSE 2-4 GM/100ML-% IV SOLN
2.0000 g | INTRAVENOUS | Status: AC
  Administered 2023-11-11: 2 g via INTRAVENOUS

## 2023-11-11 MED ORDER — BUPIVACAINE HCL (PF) 0.25 % IJ SOLN
INTRAMUSCULAR | Status: AC
  Filled 2023-11-11: qty 30

## 2023-11-11 MED ORDER — CEFAZOLIN SODIUM-DEXTROSE 2-4 GM/100ML-% IV SOLN
INTRAVENOUS | Status: AC
  Filled 2023-11-11: qty 100

## 2023-11-11 MED ORDER — DEXAMETHASONE SODIUM PHOSPHATE 10 MG/ML IJ SOLN
INTRAMUSCULAR | Status: DC | PRN
  Administered 2023-11-11: 6 mg via INTRAVENOUS

## 2023-11-11 MED ORDER — ONDANSETRON HCL 4 MG/2ML IJ SOLN: 4.0000 mg | Freq: Once | INTRAMUSCULAR | Status: DC | PRN

## 2023-11-11 MED ORDER — LIDOCAINE 2% (20 MG/ML) 5 ML SYRINGE
INTRAMUSCULAR | Status: DC | PRN
  Administered 2023-11-11: 100 mg via INTRAVENOUS

## 2023-11-11 SURGICAL SUPPLY — 22 items
BLADE CLIPPER SENSICLIP SURGIC (BLADE) ×2 IMPLANT
CNTNR URN SCR LID CUP LEK RST (MISCELLANEOUS) ×2 IMPLANT
COVER BACK TABLE 60X90IN (DRAPES) ×2 IMPLANT
DRSG TEGADERM 4X4.75 (GAUZE/BANDAGES/DRESSINGS) ×2 IMPLANT
DRSG TEGADERM 8X12 (GAUZE/BANDAGES/DRESSINGS) ×2 IMPLANT
GAUZE SPONGE 4X4 12PLY STRL (GAUZE/BANDAGES/DRESSINGS) ×2 IMPLANT
GLOVE BIO SURGEON STRL SZ7.5 (GLOVE) ×2 IMPLANT
GLOVE SURG ORTHO 8.5 STRL (GLOVE) ×2 IMPLANT
IMPL SPACEOAR SYSTEM 10ML (Spacer) ×2 IMPLANT
KIT TURNOVER KIT B (KITS) ×2 IMPLANT
MARKER GOLD PRELOAD 1.2X3 (Urological Implant) ×2 IMPLANT
MARKER SKIN DUAL TIP RULER LAB (MISCELLANEOUS) ×2 IMPLANT
NDL SPNL 22GX3.5 QUINCKE BK (NEEDLE) ×2 IMPLANT
NEEDLE SPNL 22GX3.5 QUINCKE BK (NEEDLE) ×1 IMPLANT
SHEATH ULTRASOUND LF (SHEATH) IMPLANT
SHEATH ULTRASOUND LTX NONSTRL (SHEATH) IMPLANT
SLEEVE SCD COMPRESS KNEE MED (STOCKING) ×2 IMPLANT
SOL PREP POV-IOD 4OZ 10% (MISCELLANEOUS) IMPLANT
SURGILUBE 2OZ TUBE FLIPTOP (MISCELLANEOUS) ×2 IMPLANT
SYR 10ML LL (SYRINGE) ×2 IMPLANT
SYR CONTROL 10ML LL (SYRINGE) ×2 IMPLANT
UNDERPAD 30X36 HEAVY ABSORB (UNDERPADS AND DIAPERS) ×2 IMPLANT

## 2023-11-11 NOTE — Transfer of Care (Signed)
 Immediate Anesthesia Transfer of Care Note  Patient: Ronald King  Procedure(s) Performed: INSERTION, GOLD SEEDS INJECTION, HYDROGEL SPACER  Patient Location: PACU  Anesthesia Type:General  Level of Consciousness: awake, alert , oriented, and patient cooperative  Airway & Oxygen Therapy: Patient Spontanous Breathing  Post-op Assessment: Report given to RN, Post -op Vital signs reviewed and stable, Patient moving all extremities X 4, and Patient able to stick tongue midline  Post vital signs: Reviewed and stable  Last Vitals:  Vitals Value Taken Time  BP 123/84 11/11/23 09:05  Temp 97.6   Pulse 77 11/11/23 09:07  Resp 17 11/11/23 09:07  SpO2 94 % 11/11/23 09:07  Vitals shown include unfiled device data.  Last Pain:  Vitals:   11/11/23 0613  TempSrc: Oral  PainSc: 0-No pain      Patients Stated Pain Goal: 3 (11/11/23 9386)  Complications: No notable events documented.

## 2023-11-11 NOTE — Discharge Instructions (Signed)

## 2023-11-11 NOTE — H&P (Signed)
 Office Visit Report     11/09/2023   --------------------------------------------------------------------------------   Charlie CHARM Cohen  MRN: [id number]  DOB: 09/19/1945, 78 year old Male  SSN: [id number]   PRIMARY CARE:  Aloysius Mech, MD  PRIMARY CARE FAX:  [phone]  REFERRING:  Ubaldo FABIENE Eagles, NP  PROVIDER:  Donnice Brooks, M.D.  LOCATION:  [facility] [phone]     --------------------------------------------------------------------------------   CC/HPI: F/u -   1) PCa - HR PCa dx Mar 2025 being treat with LT ADT (Mar 2025 - Mar 2027) and PA Ashlyn, moving forward with the recommended 8 week course of external beam therapy concurrent with LT-ADT and ARPI. We will initially treat the prostate and pelvic nodes followed by a boost to the prostate and PET positive pelvic skeletal and nodal metastases. His fiducial markers and SpaceOAR gel placement is scheduled for Aug 2025.   H/o PSA elevation with October 2022 PSA was 5.0. PSA usually runs 2.5-3.5 range since 2017. No FH PCa. No GU surgery. No bx. His Jan 2023 PSA was 4.23 and Mar 2023 PSA is 4.24. His Dec 2023 PSA 4.85. His Jun 2024 PSA 4.9. His December 2024 PSA was up significantly to 12.5. Rare bacteria. Urine culture was negative. We went ahead and treated him with some Bactrim as he felt symptomatic (pain and infection) and repeated the PSA. His January 2025 PSA rose to 19.2 and Feb 2025 to 25.   A March 2025 prostate MRI-most of the left side of the prostate a PI-RADS 5 lesion, possible SV invasion and extracapsular extension. A left iliac node and possible bone lesions in the ischium. Prostate 87 g. BIOPSY Mar 2025 with GG4 disease in 7.12 cores. He underwent PET scan Mar 2025 with no specific activity identified within the primary prostate carcinoma, multifocal intense radiotracer avid skeletal metastasis in the pelvis and lumbar spine (4 lesions), a LEFT external iliac lymph node has mild nonspecific  radiotracer avid. Presented at Apr 2025 [facility] - Recommendation was to add abiraterone and prednisone  or darolutamide and then proceed with consolidating radiation given that he has 5 or fewer mets. He was started Mar 2025 Orgovyx and [place] PA Ashlyn, moving forward with the recommended 8 week course of external beam therapy concurrent with LT-ADT and ARPI. We will initially treat the prostate and pelvic nodes followed by a boost to the prostate and PET positive pelvic skeletal and nodal metastases. Fiducial markers and SpaceOAR gel placement sch Aug 2025.   Biopsy:  March 2025 high risk prostate cancer  PSA 24  T2b  GG 4, 7 cores, 10 to 100%, left and right (all 6 left)  GG 1, 1 core, 10%, right  8/12   Primary tx:  Mar 2025 - Mar 2026 - Orgovyx --> eligard  + Nubeqa   Staging:  MRI-most of the left side of the prostate a PI-RADS 5 lesion, possible SV invasion and extracapsular extension. A left iliac node and possible bone lesions in the ischium. Prostate 87 g.  Mar 2025 PET scan - no specific activity identified within the primary prostate carcinoma, multifocal intense radiotracer avid skeletal metastasis in the pelvis and lumbar spine (4 lesions), a LEFT external iliac lymph node has mild nonspecific radiotracer avid.   2) BPH - He has a h/o low T and saw Dr. Faythe. He's had right testicle pain and some SP discomfort. He feels like he has an infection. He took bactrim for similar. He had to do this with Dr. Andra back  in 2004. UA with 20-40 wbc and few bacteria. Urine cx was negative. Occasional frequency. AUASS - 8. Occasional nocturia. June 2024 cystoscopy shows BPH with a significant intravesical component. IPSS 16. Urine culture was negative. PVR 155.   He developed urinary retention had a Foley catheter placed. He passed a voiding trial this morning. Given the catheter use we gave him a gram of Rocephin  and he took Levaquin. Failed VT.   2) gross hematuria - Apr 2024 - He noted  gross hematuria. Red urine and blood per meatus. No clots. Bleeding resolved. He felt an urge. He also has some left testicle pain and wonders if he needs bactrim for an infection. He's been coughing. He held his urine for a good while on the phone. He smoked x 2 yrs. His Apr 2024 CT benign. Prostate 50 g. June 2024 cystoscopy - benign.   Today, seen for the above. He underwent AQB June 2025. He is doing well. His stream is intermittent and he remains on tamsulosin but feels like he is emptying his bladder. He also has some urgency and rare urge incontinence and continues Gemtesa. His Aug 2025 PSA < 0.1, T < 3. Scheduled for gold seed/fudicial later this week - Aug 2025.   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     ALLERGIES: cefuroxime Montelukast    MEDICATIONS: Gemtesa 75 MG Tablet 1 tablet PO Daily  amLODIPine  Besylate 10 MG Tablet  Amoxicillin  500 MG Tablet  Calcium  + D  Centrum Silver  clonazePAM  1 MG Tablet  Co Q-10  Fenofibrate  145 MG Tablet  Hycodan 5-1.5 MG/5ML Solution  levoFLOXacin 750 MG Tablet 1 tablet PO MORNING OF PROCEDURE (3/19)  Orgovyx 120 MG Tablet 1 tablet PO Daily  PARoxetine  HCl 20 MG Tablet  Prevacid  30 MG Capsule Delayed Release  Rosuvastatin  Calcium  40 MG Tablet  Sucralfate  1 GM Tablet  Tamsulosin HCl 0.4 MG Capsule 1 capsule PO Daily  Trulicity 0.75 MG/0.5ML Solution Auto-injector  Vitamin B-12 100 MCG Tablet  Vitamin B6  Vitamin B-6 25 MG Tablet tablet  Vitamin C     GU PSH: Cystoscopy - 09/15/2022 Locm 300-399Mg /Ml Iodine,1Ml - 06/30/2022 Prostate Needle Biopsy - 06/15/2023       PSH Notes: fissure, hernia with esophageal wrap per pt   NON-GU PSH: Hand/finger Surgery, Left Hernia Repair Knee Arthroscopy, Left Knee Arthroscopy/surgery, Left Revise Nose Surgical Pathology, Gross And Microscopic Examination For Prostate Needle - 06/15/2023 Visit Complexity (formerly GPC1X) - 10/12/2023, 09/19/2023, 07/20/2023,  07/06/2023, 06/09/2023, 06/17/2022     GU PMH: BPH w/LUTS - 10/12/2023, - 09/19/2023, We went over the anatomy and discussed the nature risk benefits and alternatives to aqua ablation. We discussed preserving ejaculation, but he would like to maximize chance of voiding. Discussed retreatment rate after prostate procedures. bleeding, infection, stricture, and incontinence discussed among others. He is not clinically infected but given upcoming procedure we will start him back on cephalexin  based on his prior culture.n, - 07/20/2023, - 07/06/2023, We discussed depending on his clinical course he may need a TURP or aqua ablation. We went over the nature risk and benefits of the procedure. He said he has heard about the howitzer. In the short-term discussed starting finasteride and went over the nature risk and benefits of the medicine and he will start., - 06/15/2023, I recommended he start tamsulosin.We went over nature r/b of tamsulosin. He wanted a few more days of abx, - 04/28/2023, We discussed the nature  r/b of surveillance, alpha blocker, 5ari, daily pde5i, OAB meds and procedures such as TURP, OBD, LVP, LEP, RWJ, RSLP in the OR or PUL or WVT in the office. , - 09/15/2022, - 03/25/2022 Urinary Retention - 10/12/2023, - 09/20/2023, - 09/19/2023, - 07/20/2023, - 07/06/2023, - 06/15/2023, - 06/09/2023 Prostate Cancer - 09/20/2023, - 08/09/2023, I had a long discussion with the patient and his wife on his PSA results, path report, staging and prognosis. We went over the nature risks and benefits of ADT as well as AR PI. We discussed 1 benefit of adding curative radiation is that hopefully he can stop these medicines in a couple of years. We went over the role of radiation. Discussed fiducials and SpaceOAR., - 07/20/2023, - 07/06/2023 Gross hematuria - 09/19/2023, benign eval , - 09/15/2022, - 06/30/2022, eval with CT scan and cystoscopy - discussed nature r/b of each test. , - 06/17/2022 Secondary bone metastases, [place] went over bone  health and lifestyle modifications. - 07/20/2023, - 07/06/2023 Elevated PSA - 06/24/2023, Dick ask if he would need radiation or chemo. We discussed depending on his stage and grade he would need ADT plus a testosterone  receptor blocker such as Nubeqa or abiraterone and we went over the nature risk benefits and alternatives to these medications. Then we would consider whether he would need radiation for curative intent and/or chemotherapy., - 06/15/2023, - 06/09/2023, We discussed the PSA and etiology-could be malignant or inflammation. We discussed the nature risk and benefits of a prostate MRI or proceeding directly to a TRUS biopsy. All questions answered., - 04/28/2023, repeat PSA. consider mri. , - 03/17/2023, disc PSA stable and benign and malignant causes. His PSAD is normal. , - 09/15/2022, CT will also check prostate and PSAD, - 06/17/2022, disc again PSA - PCa vs BPH. UA clear. Disc mri, surv and bx. ALso age specific and overal PSAV. He would like to follow more age specific levels. , - 03/25/2022, His PSA remains stable. Slight elevation but PSA density likely normal. His exam was benign. We discussed he may have prostate cancer but tends to be low risk when PSA less than 10. All questions answered. He would like to continue surveillance. Will see in 9 mo for PSA and DRE. , - 2023, given bacteria. symptoms and PSA - a short course of abx not unreasonable. Send ABx x 1 week - we went over the nature r/b/a. We discussed the nature, risks and benefits of PSA screening as well as the nature of elevated PSA (benign versus malignant). We discussed the possibility of prostate cancer and that as the PSA rises above the level of 2.5 and even over 1, the risk of prostate cancer on biopsy increases. We discussed the management of prostate cancer might include active surveillance or treatment depending on patient and cancer characteristics. In that context, we discussed the nature, risks and benefits of continued  surveillance, other lab tests, transrectal ultrasound/prostate biopsy, or prostate MRI. All questions answered. , - 2022, - 2018, - 2018, - 2017 Nocturia - 04/28/2023, - 09/15/2022, - 03/25/2022 Acute prostatitis, send urine for cx and tx with abx. cr 1.14, gfr 63 oct 2024 - 03/17/2023, urine sent for cx , - 2022 BPH w/o LUTS - 06/17/2022, - 2023, - 2018, - 2018, - 2017 Primary hypogonadism, Disc nature r/b of T replacement - MACE - blood clots and heart attacks. Interestingly, low T is more associated with PCa and studies have not showed a change in PSA or PCa in those on  T replacment. - 2022, - 2018    NON-GU PMH: Pyuria/other UA findings, send urine for cx and do bactrim x 5 days - 06/17/2022 Anxiety Arthritis Cardiac murmur, unspecified GERD Hypertension Restless legs syndrome Sleep Apnea    FAMILY HISTORY: 1 Daughter - Daughter brain aneurysm - Father Cancer - Brother father deceased at age 81 brain aneurism - Father Heart Disease - Runs in Family mother deceased at age 21 - Mother   SOCIAL HISTORY: Marital Status: Married Preferred Language: English; Ethnicity: Not Hispanic Or Latino; Race: White Current Smoking Status: Patient has never smoked.  Has never drank.  Drinks 3 caffeinated drinks per day. Patient's occupation is/was retired.     Notes: 1 daughter   REVIEW OF SYSTEMS:    GU Review Male:   Patient denies frequent urination, hard to postpone urination, burning/ pain with urination, get up at night to urinate, leakage of urine, stream starts and stops, trouble starting your stream, have to strain to urinate , erection problems, and penile pain.  Gastrointestinal (Upper):   Patient denies nausea, vomiting, and indigestion/ heartburn.  Gastrointestinal (Lower):   Patient denies diarrhea and constipation.  Constitutional:   Patient denies fever, night sweats, weight loss, and fatigue.  Skin:   Patient denies skin rash/ lesion and itching.  Eyes:   Patient denies blurred  vision and double vision.  Ears/ Nose/ Throat:   Patient denies sore throat and sinus problems.  Hematologic/Lymphatic:   Patient denies swollen glands and easy bruising.  Cardiovascular:   Patient denies leg swelling and chest pains.  Respiratory:   Patient denies shortness of breath and cough.  Endocrine:   Patient denies excessive thirst.  Musculoskeletal:   Patient denies back pain and joint pain.  Neurological:   Patient denies headaches and dizziness.  Psychologic:   Patient denies depression and anxiety.   VITAL SIGNS: None   MULTI-SYSTEM PHYSICAL EXAMINATION:    Constitutional: Well-nourished. No physical deformities. Normally developed. Good grooming.  Neck: Neck symmetrical, not swollen. Normal tracheal position.  Respiratory: No labored breathing, no use of accessory muscles.   Cardiovascular: Normal temperature, normal extremity pulses, no swelling, no varicosities.  Skin: No paleness, no jaundice, no cyanosis. No lesion, no ulcer, no rash.  Neurologic / Psychiatric: Oriented to time, oriented to place, oriented to person. No depression, no anxiety, no agitation.  Gastrointestinal: No mass, no tenderness, no rigidity, non obese abdomen.     Complexity of Data:  Lab Test Review:   PSA, Total Testosterone   Records Review:   Previous Doctor Records   11/02/23 05/12/23 04/21/23 03/10/23 09/09/22 03/11/22 06/18/21 04/21/21  PSA  Total PSA < 0.1 ng/ml 23.90 ng/mL 19.20 ng/mL 12.50 ng/mL 4.94 ng/mL 4.85 ng/mL 4.24 ng/mL 4.23 ng/mL  Free PSA     1.23 ng/mL 1.28 ng/mL 1.22 ng/mL 1.12 ng/mL  % Free PSA     25 % PSA 26 % PSA 29 % PSA 26 % PSA    11/02/23 04/21/21 03/01/03  Hormones  Testosterone , Total < 3 pg/dL 696.4 ng/dL 7.99     PROCEDURES:          Visit Complexity - G2211 Chronic management   ASSESSMENT:      ICD-10 Details  1 GU:   Prostate Cancer - C61 Chronic, Stable - PSA looks great we discussed long-term prognosis. Also discussed the nature risk benefits and  alternatives to SpaceOAR gold seed with risk of rectal and urethral injury among others.  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 Stop finasteride.  4   BPH w/LUTS - N40.1 Chronic, Stable   PLAN:           Schedule Labs: 3 Months - Total Testosterone     3 Months - PSA  Lab Notes: 1 week prior to APC  Return Visit/Planned Activity: 3 Months - [place] , [facility]             Note: 30 min APC          Document Letter(s):  Created for Patient: Clinical Summary         Next Appointment:      Next Appointment: 11/11/2023 07:30 AM    Appointment Type: Surgery     Location: [facility] [phone]    Provider: Donnice Siad, M.D.    Reason for Visit: OP--CONE--FIDUCIAL MARKERS/OAR    Urology Preoperative H&P   Chief Complaint: Metastatic prostate cancer  History of Present Illness: OVIE CORNELIO is a 78 y.o. male with metastatic prostate cancer here for fiducial marker and Space OAR. Denies fevers, chills, dysuria.    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   Heart murmur    Valve stenosis,  ECHO 01-2021   Hemorrhoids, internal    History of gastrointestinal hemorrhage    Hypertension    Hypogonadism, male    per Dr Faythe   Hypothyroidism    Neuromuscular disorder (HCC)    neuropathy feet   Neuropathy    per neuro-Dr Lester vicodin   OSA (obstructive sleep apnea)    on CPAP   Sleep apnea    CPAP at night   Thyroid  nodule    Vitamin D  deficiency     Past Surgical History:  Procedure Laterality Date    ANAL FISSURE REPAIR     BIOPSY THYROID      hand fracture surgery     left   KNEE ARTHROSCOPY Left 2007   Dr. Addie   LAPAROSCOPIC NISSEN FUNDOPLICATION  05/1999   Dr. Gladis   NOSE SURGERY     PROSTATE BIOPSY     THYROIDECTOMY N/A 10/14/2023   Procedure: THYROIDECTOMY;  Surgeon: Polly Cordella LABOR, MD;  Location: [facility];  Service: General;  Laterality: N/A;  TOTAL THYROIDECTOMY   TONSILLECTOMY     UPPER GASTROINTESTINAL ENDOSCOPY      Allergies:  Allergies  Allergen Reactions   Montelukast Shortness Of Breath   Ceftin [Cefuroxime Axetil] Other (See Comments)    Unknown reaction    Duloxetine     Other reaction(s): Other (See Comments) refuses   Lyrica [Pregabalin] Other (See Comments)    Drowsiness   Metformin Hcl Other (See Comments)    Unknown reaction   Neurontin  [Gabapentin ] Other (See Comments)    Drowsiness /affected memory   Testosterone  Other (See Comments)    HTN    Family History  Problem Relation Age of Onset   Cerebral aneurysm Father  Alcohol abuse Father    Heart disease Father        brain aneurism   Neuropathy Mother    Heart disease Mother        died at age 3   Migraines Brother        several   Heart disease Brother        heart attack   Diabetes Maternal Uncle    CAD Brother        CABG in his 76s   Colon cancer Neg Hx    Prostate cancer Neg Hx    Esophageal cancer Neg Hx    Stomach cancer Neg Hx    Rectal cancer Neg Hx     Social History:  reports that he quit smoking about 58 years ago. His smoking use included cigarettes. He started smoking about 61 years ago. He has a 3 pack-year smoking history. He has never used smokeless tobacco. He reports that he does not drink alcohol and does not use drugs.  ROS: A complete review of systems was performed.  All systems are negative except for pertinent findings as noted.  Physical Exam:  Vital signs in last 24 hours: Temp:  [98.3 F (36.8 C)] 98.3 F (36.8 C) (08/15 0613) Pulse  Rate:  [77] 77 (08/15 0613) Resp:  [17] 17 (08/15 0613) BP: (131)/(76) 131/76 (08/15 0613) SpO2:  [95 %] 95 % (08/15 9386) Weight:  [115.2 kg] 115.2 kg (08/15 9386) Constitutional:  Alert and oriented, No acute distress Cardiovascular: Regular rate and rhythm Respiratory: Normal respiratory effort, Lungs clear bilaterally GI: Abdomen is soft, nontender, nondistended, no abdominal masses GU: No CVA tenderness Lymphatic: No lymphadenopathy Neurologic: Grossly intact, no focal deficits Psychiatric: Normal mood and affect  Laboratory Data:  No results for input(s): WBC, HGB, HCT, PLT in the last 72 hours.  Recent Labs    11/11/23 0628  NA 142  K 3.8  CL 109  GLUCOSE 106*  BUN 16  CALCIUM  9.1  CREATININE 1.11     Results for orders placed or performed during the hospital encounter of 11/11/23 (from the past 24 hours)  Hemoglobin A1c per protocol     Status: Abnormal   Collection Time: 11/11/23  6:28 AM  Result Value Ref Range   Hgb A1c MFr Bld 6.2 (H) 4.8 - 5.6 %   Mean Plasma Glucose 131.24 mg/dL  Basic metabolic panel per protocol     Status: Abnormal   Collection Time: 11/11/23  6:28 AM  Result Value Ref Range   Sodium 142 135 - 145 mmol/L   Potassium 3.8 3.5 - 5.1 mmol/L   Chloride 109 98 - 111 mmol/L   CO2 25 22 - 32 mmol/L   Glucose, Bld 106 (H) 70 - 99 mg/dL   BUN 16 8 - 23 mg/dL   Creatinine, Ser 8.88 0.61 - 1.24 mg/dL   Calcium  9.1 8.9 - 10.3 mg/dL   GFR, Estimated >39 >39 mL/min   Anion gap 8 5 - 15  Glucose, capillary     Status: None   Collection Time: 11/11/23  6:35 AM  Result Value Ref Range   Glucose-Capillary 98 70 - 99 mg/dL   No results found for this or any previous visit (from the past 240 hours).  Renal Function: Recent Labs    11/11/23 9371  CREATININE 1.11   Estimated Creatinine Clearance: 74.1 mL/min (by C-G formula based on SCr of 1.11 mg/dL).  Radiologic Imaging: No results found.  I independently reviewed  the above  imaging studies.  Assessment and Plan KEYAAN LEDERMAN is a 78 y.o. male with metastatic prostate cancer here for fiducial marker and SpaceOAR.  Matt R. Saintclair Schroader MD 11/11/2023, 7:33 AM  [facility] Pager: [phone]

## 2023-11-11 NOTE — Anesthesia Preprocedure Evaluation (Addendum)
 Anesthesia Evaluation  Patient identified by MRN, date of birth, ID band Patient awake    Reviewed: Allergy  & Precautions, NPO status , Patient's Chart, lab work & pertinent test results  Airway Mallampati: III  TM Distance: >3 FB Neck ROM: Full    Dental  (+) Chipped,    Pulmonary sleep apnea and Continuous Positive Airway Pressure Ventilation , former smoker   Pulmonary exam normal        Cardiovascular hypertension, Pt. on medications Normal cardiovascular exam+ dysrhythmias Atrial Fibrillation + Valvular Problems/Murmurs AS   ECHO: 1. Left ventricular ejection fraction, by estimation, is 60 to 65%. The  left ventricle has normal function. The left ventricle has no regional  wall motion abnormalities. There is mild left ventricular hypertrophy.  Left ventricular diastolic parameters  are indeterminate.   2. Right ventricular systolic function is normal. The right ventricular  size is normal.   3. The mitral valve is normal in structure. No evidence of mitral valve  regurgitation. No evidence of mitral stenosis.   4. The aortic valve is calcified. There is mild calcification of the  aortic valve. There is mild thickening of the aortic valve. Aortic valve  regurgitation is not visualized. Mild aortic valve stenosis. Aortic valve  area, by VTI measures 1.45 cm.  Aortic valve mean gradient measures 16.5 mmHg.   5. The inferior vena cava is normal in size with greater than 50%  respiratory variability, suggesting right atrial pressure of 3 mmHg.   Comparison(s): Echocardiogram done 01/29/21 showed an EF of 55-60% with  mild AS and an AV Mean Grad of 10 mmHg.     Neuro/Psych  Headaches  Anxiety        GI/Hepatic Neg liver ROS,GERD  Medicated and Controlled,,  Endo/Other  diabetesHypothyroidism  Patient on GLP-1 Agonist  Renal/GU negative Renal ROS     Musculoskeletal  (+) Arthritis ,    Abdominal  (+) + obese   Peds  Hematology  (+) Blood dyscrasia (Eliquis )   Anesthesia Other Findings PROSTATE CANCER  Reproductive/Obstetrics                              Anesthesia Physical Anesthesia Plan  ASA: 3  Anesthesia Plan: General   Post-op Pain Management:    Induction: Intravenous  PONV Risk Score and Plan: 2 and Ondansetron , Dexamethasone , Midazolam  and Treatment may vary due to age or medical condition  Airway Management Planned: Simple Face Mask  Additional Equipment:   Intra-op Plan:   Post-operative Plan:   Informed Consent: I have reviewed the patients History and Physical, chart, labs and discussed the procedure including the risks, benefits and alternatives for the proposed anesthesia with the patient or authorized representative who has indicated his/her understanding and acceptance.     Dental advisory given  Plan Discussed with: CRNA  Anesthesia Plan Comments:         Anesthesia Quick Evaluation

## 2023-11-11 NOTE — Op Note (Signed)
 Operative Note  Preoperative diagnosis:  1.  Metastatic adenocarcinoma of the prostate   Postoperative diagnosis: 1.  Clinically localized adenocarcinoma of the prostate  Procedure(s): 1. Placement of fiducial markers into prostate 2. Insertion of SpaceOAR hydrogel   Surgeon: Donnice Siad, MD  Assistants:  None  Anesthesia:  General  Complications:  None  EBL:  Minimal  Specimens: 1. None  Drains/Catheters: 1.  None  Indication:  Ronald King is a 78 y.o. male with metastatic prostate cancer. After discussing management options for treatment, he elected to proceed with radiotherapy in combination with ADT and ARPI. He presents today for the above procedures. The potential risks, complications, alternative options, and expected recovery course have been discussed in detail with the patient and he has provided informed consent to proceed.  Description of procedure: The patient was administered preoperative antibiotics, placed in the dorsal lithotomy position, and prepped and draped in the usual sterile fashion. Next, transrectal ultrasonography was utilized to visualize the prostate. Three gold fiducial markers were then placed into the prostate via transperineal needles under ultrasound guidance at the right apex, right base, and left mid gland under direct ultrasound guidance. A site in the midline was then selected on the perineum for placement of an 18 g needle with saline. The needle was advanced above the rectum and below Denonvillier's fascia to the mid gland and confirmed to be in the midline on transverse imaging. One cc of saline was injected confirming appropriate expansion of this space. A total of 5 cc of saline was then injected to open the space further bilaterally. The saline syringe was then removed and the SpaceOAR hydrogel was injected with good distribution bilaterally. He tolerated the procedure well and without complications. He was given a voiding trial  prior to discharge from the PACU.  [name] MD [facility]  Pager: [phone]

## 2023-11-11 NOTE — Discharge Instr - Supplementary Instructions (Signed)
 May take Tylenol  after 12:20pm if needed for discomfort.  May take Ibuprofen ,Advil ,Aleve after 12:18 if needed for discomfort.

## 2023-11-11 NOTE — Anesthesia Procedure Notes (Signed)
 Procedure Name: MAC Date/Time: 11/11/2023 8:34 AM  Performed by: Viviana Almarie DASEN, CRNAPre-anesthesia Checklist: Patient identified, Emergency Drugs available, Suction available, Patient being monitored and Timeout performed Patient Re-evaluated:Patient Re-evaluated prior to induction Oxygen Delivery Method: Simple face mask Induction Type: IV induction

## 2023-11-14 ENCOUNTER — Ambulatory Visit
Admission: RE | Admit: 2023-11-14 | Discharge: 2023-11-14 | Disposition: A | Discharge status: Home / Self Care | Source: Ambulatory Visit | Attending: Urology | Admitting: Urology

## 2023-11-14 ENCOUNTER — Ambulatory Visit (HOSPITAL_COMMUNITY)
Admission: RE | Admit: 2023-11-14 | Discharge: 2023-11-14 | Disposition: A | Discharge status: Home / Self Care | Source: Ambulatory Visit | Attending: Urology | Admitting: Urology

## 2023-11-14 ENCOUNTER — Encounter (HOSPITAL_COMMUNITY): Payer: Self-pay | Admitting: Urology

## 2023-11-14 DIAGNOSIS — C61 Malignant neoplasm of prostate: Secondary | ICD-10-CM | POA: Insufficient documentation

## 2023-11-14 DIAGNOSIS — Z191 Hormone sensitive malignancy status: Secondary | ICD-10-CM | POA: Diagnosis not present

## 2023-11-14 DIAGNOSIS — C7951 Secondary malignant neoplasm of bone: Secondary | ICD-10-CM

## 2023-11-14 NOTE — Anesthesia Postprocedure Evaluation (Signed)
 Anesthesia Post Note  Patient: Ronald King  Procedure(s) Performed: INSERTION, GOLD SEEDS INJECTION, HYDROGEL SPACER     Patient location during evaluation: PACU Anesthesia Type: MAC Level of consciousness: awake Pain management: pain level controlled Vital Signs Assessment: post-procedure vital signs reviewed and stable Respiratory status: spontaneous breathing, nonlabored ventilation and respiratory function stable Cardiovascular status: blood pressure returned to baseline and stable Postop Assessment: no apparent nausea or vomiting Anesthetic complications: no   No notable events documented.  Last Vitals:  Vitals:   11/11/23 0930 11/11/23 1010  BP: 136/85   Pulse: 77   Resp: 16   Temp:  36.6 C  SpO2: 98%     Last Pain:  Vitals:   11/11/23 0945  TempSrc:   PainSc: 4                  Maurita Havener P Jennessy Sandridge

## 2023-11-23 DIAGNOSIS — C73 Malignant neoplasm of thyroid gland: Secondary | ICD-10-CM | POA: Diagnosis not present

## 2023-11-23 DIAGNOSIS — E230 Hypopituitarism: Secondary | ICD-10-CM | POA: Diagnosis not present

## 2023-11-23 DIAGNOSIS — I4891 Unspecified atrial fibrillation: Secondary | ICD-10-CM | POA: Diagnosis not present

## 2023-11-23 DIAGNOSIS — E890 Postprocedural hypothyroidism: Secondary | ICD-10-CM | POA: Diagnosis not present

## 2023-11-23 DIAGNOSIS — E119 Type 2 diabetes mellitus without complications: Secondary | ICD-10-CM | POA: Diagnosis not present

## 2023-11-23 DIAGNOSIS — R490 Dysphonia: Secondary | ICD-10-CM | POA: Diagnosis not present

## 2023-11-24 ENCOUNTER — Ambulatory Visit

## 2023-11-24 DIAGNOSIS — C61 Malignant neoplasm of prostate: Secondary | ICD-10-CM | POA: Diagnosis not present

## 2023-11-25 ENCOUNTER — Ambulatory Visit

## 2023-11-29 ENCOUNTER — Other Ambulatory Visit: Payer: Self-pay

## 2023-11-29 ENCOUNTER — Ambulatory Visit
Admission: RE | Admit: 2023-11-29 | Discharge: 2023-11-29 | Disposition: A | Discharge status: Home / Self Care | Source: Ambulatory Visit | Attending: Radiation Oncology | Admitting: Radiation Oncology

## 2023-11-29 DIAGNOSIS — C7951 Secondary malignant neoplasm of bone: Secondary | ICD-10-CM | POA: Diagnosis present

## 2023-11-29 DIAGNOSIS — C61 Malignant neoplasm of prostate: Secondary | ICD-10-CM | POA: Insufficient documentation

## 2023-11-29 DIAGNOSIS — Z510 Encounter for antineoplastic radiation therapy: Secondary | ICD-10-CM | POA: Diagnosis not present

## 2023-11-29 DIAGNOSIS — Z191 Hormone sensitive malignancy status: Secondary | ICD-10-CM | POA: Diagnosis not present

## 2023-11-29 LAB — RAD ONC ARIA SESSION SUMMARY
Course Elapsed Days: 0
Plan Fractions Treated to Date: 1
Plan Prescribed Dose Per Fraction: 1.8 Gy
Plan Total Fractions Prescribed: 25
Plan Total Prescribed Dose: 45 Gy
Reference Point Dosage Given to Date: 1.8 Gy
Reference Point Session Dosage Given: 1.8 Gy
Session Number: 1

## 2023-11-30 ENCOUNTER — Other Ambulatory Visit: Payer: Self-pay

## 2023-11-30 ENCOUNTER — Ambulatory Visit
Admission: RE | Admit: 2023-11-30 | Discharge: 2023-11-30 | Disposition: A | Discharge status: Home / Self Care | Source: Ambulatory Visit | Attending: Radiation Oncology | Admitting: Radiation Oncology

## 2023-11-30 ENCOUNTER — Telehealth: Payer: Self-pay | Admitting: Internal Medicine

## 2023-11-30 DIAGNOSIS — Z510 Encounter for antineoplastic radiation therapy: Secondary | ICD-10-CM | POA: Diagnosis not present

## 2023-11-30 DIAGNOSIS — C61 Malignant neoplasm of prostate: Secondary | ICD-10-CM | POA: Diagnosis not present

## 2023-11-30 DIAGNOSIS — C7951 Secondary malignant neoplasm of bone: Secondary | ICD-10-CM | POA: Diagnosis not present

## 2023-11-30 DIAGNOSIS — Z191 Hormone sensitive malignancy status: Secondary | ICD-10-CM | POA: Diagnosis not present

## 2023-11-30 LAB — RAD ONC ARIA SESSION SUMMARY
Course Elapsed Days: 1
Plan Fractions Treated to Date: 2
Plan Prescribed Dose Per Fraction: 1.8 Gy
Plan Total Fractions Prescribed: 25
Plan Total Prescribed Dose: 45 Gy
Reference Point Dosage Given to Date: 3.6 Gy
Reference Point Session Dosage Given: 1.8 Gy
Session Number: 2

## 2023-11-30 NOTE — Telephone Encounter (Signed)
 PDMP okay, Rx sent

## 2023-11-30 NOTE — Telephone Encounter (Signed)
 Requesting: clonazepam  1mg   Contract: 01/03/23 UDS:01/03/23 Last Visit: 09/07/23 Next Visit: 01/16/24 Last Refill: 04/28/23 #60 and RF  Please Advise

## 2023-12-01 ENCOUNTER — Ambulatory Visit
Admission: RE | Admit: 2023-12-01 | Discharge: 2023-12-01 | Disposition: A | Discharge status: Home / Self Care | Source: Ambulatory Visit | Attending: Radiation Oncology | Admitting: Radiation Oncology

## 2023-12-01 ENCOUNTER — Other Ambulatory Visit: Payer: Self-pay

## 2023-12-01 DIAGNOSIS — Z191 Hormone sensitive malignancy status: Secondary | ICD-10-CM | POA: Diagnosis not present

## 2023-12-01 DIAGNOSIS — C61 Malignant neoplasm of prostate: Secondary | ICD-10-CM | POA: Diagnosis not present

## 2023-12-01 DIAGNOSIS — C7951 Secondary malignant neoplasm of bone: Secondary | ICD-10-CM | POA: Diagnosis not present

## 2023-12-01 DIAGNOSIS — Z510 Encounter for antineoplastic radiation therapy: Secondary | ICD-10-CM | POA: Diagnosis not present

## 2023-12-01 LAB — RAD ONC ARIA SESSION SUMMARY
Course Elapsed Days: 2
Plan Fractions Treated to Date: 3
Plan Prescribed Dose Per Fraction: 1.8 Gy
Plan Total Fractions Prescribed: 25
Plan Total Prescribed Dose: 45 Gy
Reference Point Dosage Given to Date: 5.4 Gy
Reference Point Session Dosage Given: 1.8 Gy
Session Number: 3

## 2023-12-02 ENCOUNTER — Ambulatory Visit
Admission: RE | Admit: 2023-12-02 | Discharge: 2023-12-02 | Disposition: A | Discharge status: Home / Self Care | Source: Ambulatory Visit | Attending: Radiation Oncology | Admitting: Radiation Oncology

## 2023-12-02 ENCOUNTER — Other Ambulatory Visit: Payer: Self-pay

## 2023-12-02 DIAGNOSIS — C61 Malignant neoplasm of prostate: Secondary | ICD-10-CM | POA: Diagnosis not present

## 2023-12-02 DIAGNOSIS — Z510 Encounter for antineoplastic radiation therapy: Secondary | ICD-10-CM | POA: Diagnosis not present

## 2023-12-02 DIAGNOSIS — Z191 Hormone sensitive malignancy status: Secondary | ICD-10-CM | POA: Diagnosis not present

## 2023-12-02 DIAGNOSIS — C7951 Secondary malignant neoplasm of bone: Secondary | ICD-10-CM | POA: Diagnosis not present

## 2023-12-02 LAB — RAD ONC ARIA SESSION SUMMARY
Course Elapsed Days: 3
Plan Fractions Treated to Date: 4
Plan Prescribed Dose Per Fraction: 1.8 Gy
Plan Total Fractions Prescribed: 25
Plan Total Prescribed Dose: 45 Gy
Reference Point Dosage Given to Date: 7.2 Gy
Reference Point Session Dosage Given: 1.8 Gy
Session Number: 4

## 2023-12-05 ENCOUNTER — Ambulatory Visit

## 2023-12-06 ENCOUNTER — Ambulatory Visit
Admission: RE | Admit: 2023-12-06 | Discharge: 2023-12-06 | Disposition: A | Discharge status: Home / Self Care | Source: Ambulatory Visit | Attending: Radiation Oncology | Admitting: Radiation Oncology

## 2023-12-06 ENCOUNTER — Other Ambulatory Visit: Payer: Self-pay

## 2023-12-06 DIAGNOSIS — Z191 Hormone sensitive malignancy status: Secondary | ICD-10-CM | POA: Diagnosis not present

## 2023-12-06 DIAGNOSIS — Z510 Encounter for antineoplastic radiation therapy: Secondary | ICD-10-CM | POA: Diagnosis not present

## 2023-12-06 DIAGNOSIS — C7951 Secondary malignant neoplasm of bone: Secondary | ICD-10-CM | POA: Diagnosis not present

## 2023-12-06 DIAGNOSIS — C61 Malignant neoplasm of prostate: Secondary | ICD-10-CM | POA: Diagnosis not present

## 2023-12-06 LAB — RAD ONC ARIA SESSION SUMMARY
Course Elapsed Days: 7
Plan Fractions Treated to Date: 5
Plan Prescribed Dose Per Fraction: 1.8 Gy
Plan Total Fractions Prescribed: 25
Plan Total Prescribed Dose: 45 Gy
Reference Point Dosage Given to Date: 9 Gy
Reference Point Session Dosage Given: 1.8 Gy
Session Number: 5

## 2023-12-07 ENCOUNTER — Ambulatory Visit
Admission: RE | Admit: 2023-12-07 | Discharge: 2023-12-07 | Disposition: A | Discharge status: Home / Self Care | Source: Ambulatory Visit | Attending: Radiation Oncology

## 2023-12-07 ENCOUNTER — Other Ambulatory Visit: Payer: Self-pay

## 2023-12-07 DIAGNOSIS — Z510 Encounter for antineoplastic radiation therapy: Secondary | ICD-10-CM | POA: Diagnosis not present

## 2023-12-07 DIAGNOSIS — C61 Malignant neoplasm of prostate: Secondary | ICD-10-CM | POA: Diagnosis not present

## 2023-12-07 DIAGNOSIS — C7951 Secondary malignant neoplasm of bone: Secondary | ICD-10-CM | POA: Diagnosis not present

## 2023-12-07 LAB — RAD ONC ARIA SESSION SUMMARY
Course Elapsed Days: 8
Plan Fractions Treated to Date: 6
Plan Prescribed Dose Per Fraction: 1.8 Gy
Plan Total Fractions Prescribed: 25
Plan Total Prescribed Dose: 45 Gy
Reference Point Dosage Given to Date: 10.8 Gy
Reference Point Session Dosage Given: 1.8 Gy
Session Number: 6

## 2023-12-08 ENCOUNTER — Ambulatory Visit
Admission: RE | Admit: 2023-12-08 | Discharge: 2023-12-08 | Disposition: A | Discharge status: Home / Self Care | Source: Ambulatory Visit | Attending: Radiation Oncology

## 2023-12-08 ENCOUNTER — Ambulatory Visit
Admission: RE | Admit: 2023-12-08 | Discharge: 2023-12-08 | Discharge status: Home / Self Care | Source: Ambulatory Visit | Attending: Radiation Oncology

## 2023-12-08 ENCOUNTER — Other Ambulatory Visit: Payer: Self-pay

## 2023-12-08 ENCOUNTER — Other Ambulatory Visit: Payer: Self-pay | Admitting: Radiation Oncology

## 2023-12-08 DIAGNOSIS — C7951 Secondary malignant neoplasm of bone: Secondary | ICD-10-CM | POA: Diagnosis not present

## 2023-12-08 DIAGNOSIS — Z191 Hormone sensitive malignancy status: Secondary | ICD-10-CM | POA: Diagnosis not present

## 2023-12-08 DIAGNOSIS — Z510 Encounter for antineoplastic radiation therapy: Secondary | ICD-10-CM | POA: Diagnosis not present

## 2023-12-08 DIAGNOSIS — C61 Malignant neoplasm of prostate: Secondary | ICD-10-CM | POA: Diagnosis not present

## 2023-12-08 LAB — RAD ONC ARIA SESSION SUMMARY
Course Elapsed Days: 9
Plan Fractions Treated to Date: 7
Plan Prescribed Dose Per Fraction: 1.8 Gy
Plan Total Fractions Prescribed: 25
Plan Total Prescribed Dose: 45 Gy
Reference Point Dosage Given to Date: 12.6 Gy
Reference Point Session Dosage Given: 1.8 Gy
Session Number: 7

## 2023-12-08 MED ORDER — ONDANSETRON HCL 4 MG PO TABS: 4.0000 mg | ORAL_TABLET | Freq: Three times a day (TID) | ORAL | 3 refills | Status: AC | PRN

## 2023-12-09 ENCOUNTER — Other Ambulatory Visit: Payer: Self-pay

## 2023-12-09 ENCOUNTER — Ambulatory Visit
Admission: RE | Admit: 2023-12-09 | Discharge: 2023-12-09 | Discharge status: Home / Self Care | Source: Ambulatory Visit | Attending: Radiation Oncology

## 2023-12-09 ENCOUNTER — Ambulatory Visit

## 2023-12-09 DIAGNOSIS — C61 Malignant neoplasm of prostate: Secondary | ICD-10-CM | POA: Diagnosis not present

## 2023-12-09 DIAGNOSIS — Z510 Encounter for antineoplastic radiation therapy: Secondary | ICD-10-CM | POA: Diagnosis not present

## 2023-12-09 DIAGNOSIS — Z191 Hormone sensitive malignancy status: Secondary | ICD-10-CM | POA: Diagnosis not present

## 2023-12-09 DIAGNOSIS — C7951 Secondary malignant neoplasm of bone: Secondary | ICD-10-CM | POA: Diagnosis not present

## 2023-12-09 LAB — RAD ONC ARIA SESSION SUMMARY
Course Elapsed Days: 10
Plan Fractions Treated to Date: 8
Plan Prescribed Dose Per Fraction: 1.8 Gy
Plan Total Fractions Prescribed: 25
Plan Total Prescribed Dose: 45 Gy
Reference Point Dosage Given to Date: 14.4 Gy
Reference Point Session Dosage Given: 1.8 Gy
Session Number: 8

## 2023-12-12 ENCOUNTER — Ambulatory Visit
Admission: RE | Admit: 2023-12-12 | Discharge: 2023-12-12 | Disposition: A | Discharge status: Home / Self Care | Source: Ambulatory Visit | Attending: Radiation Oncology | Admitting: Radiation Oncology

## 2023-12-12 ENCOUNTER — Other Ambulatory Visit: Payer: Self-pay

## 2023-12-12 DIAGNOSIS — C61 Malignant neoplasm of prostate: Secondary | ICD-10-CM | POA: Diagnosis not present

## 2023-12-12 DIAGNOSIS — C7951 Secondary malignant neoplasm of bone: Secondary | ICD-10-CM | POA: Diagnosis not present

## 2023-12-12 DIAGNOSIS — Z191 Hormone sensitive malignancy status: Secondary | ICD-10-CM | POA: Diagnosis not present

## 2023-12-12 DIAGNOSIS — Z510 Encounter for antineoplastic radiation therapy: Secondary | ICD-10-CM | POA: Diagnosis not present

## 2023-12-12 LAB — RAD ONC ARIA SESSION SUMMARY
Course Elapsed Days: 13
Plan Fractions Treated to Date: 9
Plan Prescribed Dose Per Fraction: 1.8 Gy
Plan Total Fractions Prescribed: 25
Plan Total Prescribed Dose: 45 Gy
Reference Point Dosage Given to Date: 16.2 Gy
Reference Point Session Dosage Given: 1.8 Gy
Session Number: 9

## 2023-12-13 ENCOUNTER — Ambulatory Visit

## 2023-12-14 ENCOUNTER — Other Ambulatory Visit: Payer: Self-pay

## 2023-12-14 ENCOUNTER — Ambulatory Visit
Admission: RE | Admit: 2023-12-14 | Discharge: 2023-12-14 | Disposition: A | Discharge status: Home / Self Care | Source: Ambulatory Visit | Attending: Radiation Oncology

## 2023-12-14 DIAGNOSIS — C61 Malignant neoplasm of prostate: Secondary | ICD-10-CM | POA: Diagnosis not present

## 2023-12-14 DIAGNOSIS — R490 Dysphonia: Secondary | ICD-10-CM | POA: Diagnosis not present

## 2023-12-14 DIAGNOSIS — C7951 Secondary malignant neoplasm of bone: Secondary | ICD-10-CM | POA: Diagnosis not present

## 2023-12-14 DIAGNOSIS — Z191 Hormone sensitive malignancy status: Secondary | ICD-10-CM | POA: Diagnosis not present

## 2023-12-14 DIAGNOSIS — J3801 Paralysis of vocal cords and larynx, unilateral: Secondary | ICD-10-CM | POA: Diagnosis not present

## 2023-12-14 DIAGNOSIS — Z9889 Other specified postprocedural states: Secondary | ICD-10-CM | POA: Diagnosis not present

## 2023-12-14 DIAGNOSIS — Z9089 Acquired absence of other organs: Secondary | ICD-10-CM | POA: Diagnosis not present

## 2023-12-14 DIAGNOSIS — Z510 Encounter for antineoplastic radiation therapy: Secondary | ICD-10-CM | POA: Diagnosis not present

## 2023-12-14 LAB — RAD ONC ARIA SESSION SUMMARY
Course Elapsed Days: 15
Plan Fractions Treated to Date: 10
Plan Prescribed Dose Per Fraction: 1.8 Gy
Plan Total Fractions Prescribed: 25
Plan Total Prescribed Dose: 45 Gy
Reference Point Dosage Given to Date: 18 Gy
Reference Point Session Dosage Given: 1.8 Gy
Session Number: 10

## 2023-12-15 ENCOUNTER — Ambulatory Visit

## 2023-12-15 ENCOUNTER — Ambulatory Visit
Admission: RE | Admit: 2023-12-15 | Discharge: 2023-12-15 | Disposition: A | Discharge status: Home / Self Care | Source: Ambulatory Visit | Attending: Radiation Oncology

## 2023-12-15 ENCOUNTER — Other Ambulatory Visit: Payer: Self-pay

## 2023-12-15 DIAGNOSIS — Z510 Encounter for antineoplastic radiation therapy: Secondary | ICD-10-CM | POA: Diagnosis not present

## 2023-12-15 DIAGNOSIS — C61 Malignant neoplasm of prostate: Secondary | ICD-10-CM | POA: Diagnosis not present

## 2023-12-15 DIAGNOSIS — C7951 Secondary malignant neoplasm of bone: Secondary | ICD-10-CM | POA: Diagnosis not present

## 2023-12-15 DIAGNOSIS — Z191 Hormone sensitive malignancy status: Secondary | ICD-10-CM | POA: Diagnosis not present

## 2023-12-15 LAB — RAD ONC ARIA SESSION SUMMARY
Course Elapsed Days: 16
Plan Fractions Treated to Date: 11
Plan Prescribed Dose Per Fraction: 1.8 Gy
Plan Total Fractions Prescribed: 25
Plan Total Prescribed Dose: 45 Gy
Reference Point Dosage Given to Date: 19.8 Gy
Reference Point Session Dosage Given: 1.8 Gy
Session Number: 11

## 2023-12-16 ENCOUNTER — Ambulatory Visit

## 2023-12-16 ENCOUNTER — Other Ambulatory Visit: Payer: Self-pay

## 2023-12-16 ENCOUNTER — Ambulatory Visit
Admission: RE | Admit: 2023-12-16 | Discharge: 2023-12-16 | Disposition: A | Discharge status: Home / Self Care | Source: Ambulatory Visit | Attending: Radiation Oncology | Admitting: Radiation Oncology

## 2023-12-16 DIAGNOSIS — C7951 Secondary malignant neoplasm of bone: Secondary | ICD-10-CM | POA: Diagnosis not present

## 2023-12-16 DIAGNOSIS — Z510 Encounter for antineoplastic radiation therapy: Secondary | ICD-10-CM | POA: Diagnosis not present

## 2023-12-16 DIAGNOSIS — Z191 Hormone sensitive malignancy status: Secondary | ICD-10-CM | POA: Diagnosis not present

## 2023-12-16 DIAGNOSIS — C61 Malignant neoplasm of prostate: Secondary | ICD-10-CM | POA: Diagnosis not present

## 2023-12-16 LAB — RAD ONC ARIA SESSION SUMMARY
Course Elapsed Days: 17
Plan Fractions Treated to Date: 12
Plan Prescribed Dose Per Fraction: 1.8 Gy
Plan Total Fractions Prescribed: 25
Plan Total Prescribed Dose: 45 Gy
Reference Point Dosage Given to Date: 21.6 Gy
Reference Point Session Dosage Given: 1.8 Gy
Session Number: 12

## 2023-12-19 ENCOUNTER — Other Ambulatory Visit: Payer: Self-pay

## 2023-12-19 ENCOUNTER — Ambulatory Visit
Admission: RE | Admit: 2023-12-19 | Discharge: 2023-12-19 | Disposition: A | Discharge status: Home / Self Care | Source: Ambulatory Visit | Attending: Radiation Oncology | Admitting: Radiation Oncology

## 2023-12-19 DIAGNOSIS — C7951 Secondary malignant neoplasm of bone: Secondary | ICD-10-CM | POA: Diagnosis not present

## 2023-12-19 DIAGNOSIS — Z510 Encounter for antineoplastic radiation therapy: Secondary | ICD-10-CM | POA: Diagnosis not present

## 2023-12-19 DIAGNOSIS — C61 Malignant neoplasm of prostate: Secondary | ICD-10-CM | POA: Diagnosis not present

## 2023-12-19 DIAGNOSIS — Z191 Hormone sensitive malignancy status: Secondary | ICD-10-CM | POA: Diagnosis not present

## 2023-12-19 LAB — RAD ONC ARIA SESSION SUMMARY
Course Elapsed Days: 20
Plan Fractions Treated to Date: 13
Plan Prescribed Dose Per Fraction: 1.8 Gy
Plan Total Fractions Prescribed: 25
Plan Total Prescribed Dose: 45 Gy
Reference Point Dosage Given to Date: 23.4 Gy
Reference Point Session Dosage Given: 1.8 Gy
Session Number: 13

## 2023-12-20 ENCOUNTER — Other Ambulatory Visit: Payer: Self-pay

## 2023-12-20 ENCOUNTER — Ambulatory Visit
Admission: RE | Admit: 2023-12-20 | Discharge: 2023-12-20 | Disposition: A | Discharge status: Home / Self Care | Source: Ambulatory Visit | Attending: Radiation Oncology | Admitting: Radiation Oncology

## 2023-12-20 DIAGNOSIS — C61 Malignant neoplasm of prostate: Secondary | ICD-10-CM | POA: Diagnosis not present

## 2023-12-20 DIAGNOSIS — Z510 Encounter for antineoplastic radiation therapy: Secondary | ICD-10-CM | POA: Diagnosis not present

## 2023-12-20 DIAGNOSIS — Z191 Hormone sensitive malignancy status: Secondary | ICD-10-CM | POA: Diagnosis not present

## 2023-12-20 DIAGNOSIS — C7951 Secondary malignant neoplasm of bone: Secondary | ICD-10-CM | POA: Diagnosis not present

## 2023-12-20 LAB — RAD ONC ARIA SESSION SUMMARY
Course Elapsed Days: 21
Plan Fractions Treated to Date: 14
Plan Prescribed Dose Per Fraction: 1.8 Gy
Plan Total Fractions Prescribed: 25
Plan Total Prescribed Dose: 45 Gy
Reference Point Dosage Given to Date: 25.2 Gy
Reference Point Session Dosage Given: 1.8 Gy
Session Number: 14

## 2023-12-21 ENCOUNTER — Ambulatory Visit

## 2023-12-22 ENCOUNTER — Ambulatory Visit
Admission: RE | Admit: 2023-12-22 | Discharge: 2023-12-22 | Disposition: A | Discharge status: Home / Self Care | Source: Ambulatory Visit | Attending: Radiation Oncology | Admitting: Radiation Oncology

## 2023-12-22 ENCOUNTER — Other Ambulatory Visit: Payer: Self-pay

## 2023-12-22 DIAGNOSIS — C61 Malignant neoplasm of prostate: Secondary | ICD-10-CM | POA: Diagnosis not present

## 2023-12-22 DIAGNOSIS — Z191 Hormone sensitive malignancy status: Secondary | ICD-10-CM | POA: Diagnosis not present

## 2023-12-22 DIAGNOSIS — Z510 Encounter for antineoplastic radiation therapy: Secondary | ICD-10-CM | POA: Diagnosis not present

## 2023-12-22 DIAGNOSIS — C7951 Secondary malignant neoplasm of bone: Secondary | ICD-10-CM | POA: Diagnosis not present

## 2023-12-22 LAB — RAD ONC ARIA SESSION SUMMARY
Course Elapsed Days: 23
Plan Fractions Treated to Date: 15
Plan Prescribed Dose Per Fraction: 1.8 Gy
Plan Total Fractions Prescribed: 25
Plan Total Prescribed Dose: 45 Gy
Reference Point Dosage Given to Date: 27 Gy
Reference Point Session Dosage Given: 1.8 Gy
Session Number: 15

## 2023-12-22 MED ORDER — SONAFINE EX EMUL
1.0000 | Freq: Two times a day (BID) | CUTANEOUS | Status: DC
  Administered 2023-12-22: 1 via TOPICAL

## 2023-12-23 ENCOUNTER — Other Ambulatory Visit: Payer: Self-pay

## 2023-12-23 ENCOUNTER — Ambulatory Visit
Admission: RE | Admit: 2023-12-23 | Discharge: 2023-12-23 | Disposition: A | Discharge status: Home / Self Care | Source: Ambulatory Visit | Attending: Radiation Oncology | Admitting: Radiation Oncology

## 2023-12-23 DIAGNOSIS — Z510 Encounter for antineoplastic radiation therapy: Secondary | ICD-10-CM | POA: Diagnosis not present

## 2023-12-23 DIAGNOSIS — C61 Malignant neoplasm of prostate: Secondary | ICD-10-CM | POA: Diagnosis not present

## 2023-12-23 DIAGNOSIS — C73 Malignant neoplasm of thyroid gland: Secondary | ICD-10-CM | POA: Diagnosis not present

## 2023-12-23 DIAGNOSIS — C7951 Secondary malignant neoplasm of bone: Secondary | ICD-10-CM | POA: Diagnosis not present

## 2023-12-23 DIAGNOSIS — Z191 Hormone sensitive malignancy status: Secondary | ICD-10-CM | POA: Diagnosis not present

## 2023-12-23 LAB — RAD ONC ARIA SESSION SUMMARY
Course Elapsed Days: 24
Plan Fractions Treated to Date: 16
Plan Prescribed Dose Per Fraction: 1.8 Gy
Plan Total Fractions Prescribed: 25
Plan Total Prescribed Dose: 45 Gy
Reference Point Dosage Given to Date: 28.8 Gy
Reference Point Session Dosage Given: 1.8 Gy
Session Number: 16

## 2023-12-26 ENCOUNTER — Other Ambulatory Visit: Payer: Self-pay

## 2023-12-26 ENCOUNTER — Ambulatory Visit
Admission: RE | Admit: 2023-12-26 | Discharge: 2023-12-26 | Disposition: A | Discharge status: Home / Self Care | Source: Ambulatory Visit | Attending: Radiation Oncology | Admitting: Radiation Oncology

## 2023-12-26 DIAGNOSIS — C61 Malignant neoplasm of prostate: Secondary | ICD-10-CM | POA: Diagnosis not present

## 2023-12-26 DIAGNOSIS — C7951 Secondary malignant neoplasm of bone: Secondary | ICD-10-CM | POA: Diagnosis not present

## 2023-12-26 DIAGNOSIS — Z191 Hormone sensitive malignancy status: Secondary | ICD-10-CM | POA: Diagnosis not present

## 2023-12-26 DIAGNOSIS — Z510 Encounter for antineoplastic radiation therapy: Secondary | ICD-10-CM | POA: Diagnosis not present

## 2023-12-26 LAB — RAD ONC ARIA SESSION SUMMARY
Course Elapsed Days: 27
Plan Fractions Treated to Date: 17
Plan Prescribed Dose Per Fraction: 1.8 Gy
Plan Total Fractions Prescribed: 25
Plan Total Prescribed Dose: 45 Gy
Reference Point Dosage Given to Date: 30.6 Gy
Reference Point Session Dosage Given: 1.8 Gy
Session Number: 17

## 2023-12-27 ENCOUNTER — Other Ambulatory Visit: Payer: Self-pay

## 2023-12-27 ENCOUNTER — Ambulatory Visit
Admission: RE | Admit: 2023-12-27 | Discharge: 2023-12-27 | Disposition: A | Discharge status: Home / Self Care | Source: Ambulatory Visit | Attending: Radiation Oncology | Admitting: Radiation Oncology

## 2023-12-27 DIAGNOSIS — Z510 Encounter for antineoplastic radiation therapy: Secondary | ICD-10-CM | POA: Diagnosis not present

## 2023-12-27 DIAGNOSIS — Z191 Hormone sensitive malignancy status: Secondary | ICD-10-CM | POA: Diagnosis not present

## 2023-12-27 DIAGNOSIS — C61 Malignant neoplasm of prostate: Secondary | ICD-10-CM | POA: Diagnosis not present

## 2023-12-27 DIAGNOSIS — I4891 Unspecified atrial fibrillation: Secondary | ICD-10-CM | POA: Diagnosis not present

## 2023-12-27 DIAGNOSIS — Z85850 Personal history of malignant neoplasm of thyroid: Secondary | ICD-10-CM | POA: Diagnosis not present

## 2023-12-27 DIAGNOSIS — R490 Dysphonia: Secondary | ICD-10-CM | POA: Diagnosis not present

## 2023-12-27 DIAGNOSIS — E119 Type 2 diabetes mellitus without complications: Secondary | ICD-10-CM | POA: Diagnosis not present

## 2023-12-27 DIAGNOSIS — E890 Postprocedural hypothyroidism: Secondary | ICD-10-CM | POA: Diagnosis not present

## 2023-12-27 LAB — RAD ONC ARIA SESSION SUMMARY
Course Elapsed Days: 28
Plan Fractions Treated to Date: 18
Plan Prescribed Dose Per Fraction: 1.8 Gy
Plan Total Fractions Prescribed: 25
Plan Total Prescribed Dose: 45 Gy
Reference Point Dosage Given to Date: 32.4 Gy
Reference Point Session Dosage Given: 1.8 Gy
Session Number: 18

## 2023-12-27 LAB — HM DIABETES FOOT EXAM: HM Diabetic Foot Exam: NORMAL

## 2023-12-28 ENCOUNTER — Other Ambulatory Visit: Payer: Self-pay

## 2023-12-28 ENCOUNTER — Ambulatory Visit
Admission: RE | Admit: 2023-12-28 | Discharge: 2023-12-28 | Disposition: A | Discharge status: Home / Self Care | Source: Ambulatory Visit | Attending: Radiation Oncology | Admitting: Radiation Oncology

## 2023-12-28 DIAGNOSIS — C7951 Secondary malignant neoplasm of bone: Secondary | ICD-10-CM | POA: Diagnosis not present

## 2023-12-28 DIAGNOSIS — C61 Malignant neoplasm of prostate: Secondary | ICD-10-CM | POA: Diagnosis not present

## 2023-12-28 DIAGNOSIS — Z510 Encounter for antineoplastic radiation therapy: Secondary | ICD-10-CM | POA: Diagnosis not present

## 2023-12-28 DIAGNOSIS — Z191 Hormone sensitive malignancy status: Secondary | ICD-10-CM | POA: Diagnosis not present

## 2023-12-28 LAB — RAD ONC ARIA SESSION SUMMARY
Course Elapsed Days: 29
Plan Fractions Treated to Date: 19
Plan Prescribed Dose Per Fraction: 1.8 Gy
Plan Total Fractions Prescribed: 25
Plan Total Prescribed Dose: 45 Gy
Reference Point Dosage Given to Date: 34.2 Gy
Reference Point Session Dosage Given: 1.8 Gy
Session Number: 19

## 2023-12-29 ENCOUNTER — Ambulatory Visit
Admission: RE | Admit: 2023-12-29 | Discharge: 2023-12-29 | Disposition: A | Discharge status: Home / Self Care | Source: Ambulatory Visit | Attending: Radiation Oncology | Admitting: Radiation Oncology

## 2023-12-29 ENCOUNTER — Other Ambulatory Visit: Payer: Self-pay

## 2023-12-29 DIAGNOSIS — Z510 Encounter for antineoplastic radiation therapy: Secondary | ICD-10-CM | POA: Diagnosis not present

## 2023-12-29 DIAGNOSIS — C7951 Secondary malignant neoplasm of bone: Secondary | ICD-10-CM | POA: Diagnosis not present

## 2023-12-29 DIAGNOSIS — C61 Malignant neoplasm of prostate: Secondary | ICD-10-CM | POA: Diagnosis not present

## 2023-12-29 DIAGNOSIS — Z191 Hormone sensitive malignancy status: Secondary | ICD-10-CM | POA: Diagnosis not present

## 2023-12-29 LAB — RAD ONC ARIA SESSION SUMMARY
Course Elapsed Days: 30
Plan Fractions Treated to Date: 20
Plan Prescribed Dose Per Fraction: 1.8 Gy
Plan Total Fractions Prescribed: 25
Plan Total Prescribed Dose: 45 Gy
Reference Point Dosage Given to Date: 36 Gy
Reference Point Session Dosage Given: 1.8 Gy
Session Number: 20

## 2023-12-30 ENCOUNTER — Ambulatory Visit
Admission: RE | Admit: 2023-12-30 | Discharge: 2023-12-30 | Disposition: A | Discharge status: Home / Self Care | Source: Ambulatory Visit | Attending: Radiation Oncology | Admitting: Radiation Oncology

## 2023-12-30 ENCOUNTER — Other Ambulatory Visit: Payer: Self-pay

## 2023-12-30 ENCOUNTER — Other Ambulatory Visit: Payer: Self-pay | Admitting: Radiation Oncology

## 2023-12-30 DIAGNOSIS — Z191 Hormone sensitive malignancy status: Secondary | ICD-10-CM | POA: Diagnosis not present

## 2023-12-30 DIAGNOSIS — C61 Malignant neoplasm of prostate: Secondary | ICD-10-CM | POA: Diagnosis not present

## 2023-12-30 DIAGNOSIS — Z510 Encounter for antineoplastic radiation therapy: Secondary | ICD-10-CM | POA: Diagnosis not present

## 2023-12-30 LAB — RAD ONC ARIA SESSION SUMMARY
Course Elapsed Days: 31
Plan Fractions Treated to Date: 21
Plan Prescribed Dose Per Fraction: 1.8 Gy
Plan Total Fractions Prescribed: 25
Plan Total Prescribed Dose: 45 Gy
Reference Point Dosage Given to Date: 37.8 Gy
Reference Point Session Dosage Given: 1.8 Gy
Session Number: 21

## 2023-12-30 MED ORDER — FLUCONAZOLE 100 MG PO TABS: 100.0000 mg | ORAL_TABLET | Freq: Every day | ORAL | 0 refills | Status: AC

## 2024-01-02 ENCOUNTER — Other Ambulatory Visit: Payer: Self-pay

## 2024-01-02 ENCOUNTER — Ambulatory Visit
Admission: RE | Admit: 2024-01-02 | Discharge: 2024-01-02 | Disposition: A | Discharge status: Home / Self Care | Source: Ambulatory Visit | Attending: Radiation Oncology | Admitting: Radiation Oncology

## 2024-01-02 DIAGNOSIS — C61 Malignant neoplasm of prostate: Secondary | ICD-10-CM | POA: Diagnosis not present

## 2024-01-02 LAB — RAD ONC ARIA SESSION SUMMARY
Course Elapsed Days: 34
Plan Fractions Treated to Date: 22
Plan Prescribed Dose Per Fraction: 1.8 Gy
Plan Total Fractions Prescribed: 25
Plan Total Prescribed Dose: 45 Gy
Reference Point Dosage Given to Date: 39.6 Gy
Reference Point Session Dosage Given: 1.8 Gy
Session Number: 22

## 2024-01-03 ENCOUNTER — Ambulatory Visit
Admission: RE | Admit: 2024-01-03 | Discharge: 2024-01-03 | Disposition: A | Discharge status: Home / Self Care | Source: Ambulatory Visit | Attending: Radiation Oncology | Admitting: Radiation Oncology

## 2024-01-03 ENCOUNTER — Ambulatory Visit

## 2024-01-03 ENCOUNTER — Other Ambulatory Visit: Payer: Self-pay

## 2024-01-03 DIAGNOSIS — C61 Malignant neoplasm of prostate: Secondary | ICD-10-CM | POA: Diagnosis not present

## 2024-01-03 LAB — RAD ONC ARIA SESSION SUMMARY
Course Elapsed Days: 35
Plan Fractions Treated to Date: 23
Plan Prescribed Dose Per Fraction: 1.8 Gy
Plan Total Fractions Prescribed: 25
Plan Total Prescribed Dose: 45 Gy
Reference Point Dosage Given to Date: 41.4 Gy
Reference Point Session Dosage Given: 0.7096 Gy
Session Number: 23

## 2024-01-04 ENCOUNTER — Other Ambulatory Visit: Payer: Self-pay

## 2024-01-04 ENCOUNTER — Ambulatory Visit

## 2024-01-04 ENCOUNTER — Ambulatory Visit
Admission: RE | Admit: 2024-01-04 | Discharge: 2024-01-04 | Disposition: A | Discharge status: Home / Self Care | Source: Ambulatory Visit | Attending: Radiation Oncology | Admitting: Radiation Oncology

## 2024-01-04 DIAGNOSIS — C61 Malignant neoplasm of prostate: Secondary | ICD-10-CM | POA: Diagnosis not present

## 2024-01-04 LAB — RAD ONC ARIA SESSION SUMMARY
Course Elapsed Days: 36
Plan Fractions Treated to Date: 24
Plan Prescribed Dose Per Fraction: 1.8 Gy
Plan Total Fractions Prescribed: 25
Plan Total Prescribed Dose: 45 Gy
Reference Point Dosage Given to Date: 43.2 Gy
Reference Point Session Dosage Given: 1.8 Gy
Session Number: 24

## 2024-01-05 ENCOUNTER — Ambulatory Visit

## 2024-01-05 ENCOUNTER — Ambulatory Visit
Admission: RE | Admit: 2024-01-05 | Discharge: 2024-01-05 | Disposition: A | Discharge status: Home / Self Care | Source: Ambulatory Visit | Attending: Radiation Oncology | Admitting: Radiation Oncology

## 2024-01-05 ENCOUNTER — Other Ambulatory Visit: Payer: Self-pay

## 2024-01-05 DIAGNOSIS — C61 Malignant neoplasm of prostate: Secondary | ICD-10-CM | POA: Diagnosis not present

## 2024-01-05 LAB — RAD ONC ARIA SESSION SUMMARY
Course Elapsed Days: 37
Plan Fractions Treated to Date: 25
Plan Prescribed Dose Per Fraction: 1.8 Gy
Plan Total Fractions Prescribed: 25
Plan Total Prescribed Dose: 45 Gy
Reference Point Dosage Given to Date: 45 Gy
Reference Point Session Dosage Given: 1.8 Gy
Session Number: 25

## 2024-01-06 ENCOUNTER — Ambulatory Visit

## 2024-01-06 ENCOUNTER — Ambulatory Visit
Admission: RE | Admit: 2024-01-06 | Discharge: 2024-01-06 | Disposition: A | Discharge status: Home / Self Care | Source: Ambulatory Visit | Attending: Radiation Oncology | Admitting: Radiation Oncology

## 2024-01-06 ENCOUNTER — Other Ambulatory Visit: Payer: Self-pay

## 2024-01-06 DIAGNOSIS — C61 Malignant neoplasm of prostate: Secondary | ICD-10-CM | POA: Diagnosis not present

## 2024-01-06 LAB — RAD ONC ARIA SESSION SUMMARY
Course Elapsed Days: 38
Plan Fractions Treated to Date: 1
Plan Prescribed Dose Per Fraction: 2 Gy
Plan Total Fractions Prescribed: 15
Plan Total Prescribed Dose: 30 Gy
Reference Point Dosage Given to Date: 2 Gy
Reference Point Session Dosage Given: 2 Gy
Session Number: 26

## 2024-01-09 ENCOUNTER — Ambulatory Visit
Admission: RE | Admit: 2024-01-09 | Discharge: 2024-01-09 | Disposition: A | Discharge status: Home / Self Care | Source: Ambulatory Visit | Attending: Radiation Oncology | Admitting: Radiation Oncology

## 2024-01-09 ENCOUNTER — Other Ambulatory Visit: Payer: Self-pay

## 2024-01-09 DIAGNOSIS — C61 Malignant neoplasm of prostate: Secondary | ICD-10-CM | POA: Diagnosis not present

## 2024-01-09 LAB — RAD ONC ARIA SESSION SUMMARY
Course Elapsed Days: 41
Plan Fractions Treated to Date: 2
Plan Prescribed Dose Per Fraction: 2 Gy
Plan Total Fractions Prescribed: 15
Plan Total Prescribed Dose: 30 Gy
Reference Point Dosage Given to Date: 4 Gy
Reference Point Session Dosage Given: 2 Gy
Session Number: 27

## 2024-01-10 ENCOUNTER — Other Ambulatory Visit: Payer: Self-pay

## 2024-01-10 ENCOUNTER — Ambulatory Visit
Admission: RE | Admit: 2024-01-10 | Discharge: 2024-01-10 | Disposition: A | Source: Ambulatory Visit | Attending: Radiation Oncology

## 2024-01-10 DIAGNOSIS — Z191 Hormone sensitive malignancy status: Secondary | ICD-10-CM | POA: Diagnosis not present

## 2024-01-10 DIAGNOSIS — C7951 Secondary malignant neoplasm of bone: Secondary | ICD-10-CM | POA: Diagnosis not present

## 2024-01-10 DIAGNOSIS — C61 Malignant neoplasm of prostate: Secondary | ICD-10-CM | POA: Diagnosis not present

## 2024-01-10 DIAGNOSIS — Z510 Encounter for antineoplastic radiation therapy: Secondary | ICD-10-CM | POA: Diagnosis not present

## 2024-01-10 LAB — RAD ONC ARIA SESSION SUMMARY
Course Elapsed Days: 42
Plan Fractions Treated to Date: 3
Plan Prescribed Dose Per Fraction: 2 Gy
Plan Total Fractions Prescribed: 15
Plan Total Prescribed Dose: 30 Gy
Reference Point Dosage Given to Date: 6 Gy
Reference Point Session Dosage Given: 2 Gy
Session Number: 28

## 2024-01-11 ENCOUNTER — Other Ambulatory Visit: Payer: Self-pay

## 2024-01-11 ENCOUNTER — Ambulatory Visit
Admission: RE | Admit: 2024-01-11 | Discharge: 2024-01-11 | Disposition: A | Source: Ambulatory Visit | Attending: Radiation Oncology

## 2024-01-11 DIAGNOSIS — Z510 Encounter for antineoplastic radiation therapy: Secondary | ICD-10-CM | POA: Diagnosis not present

## 2024-01-11 DIAGNOSIS — C61 Malignant neoplasm of prostate: Secondary | ICD-10-CM | POA: Diagnosis not present

## 2024-01-11 DIAGNOSIS — Z191 Hormone sensitive malignancy status: Secondary | ICD-10-CM | POA: Diagnosis not present

## 2024-01-11 LAB — RAD ONC ARIA SESSION SUMMARY
Course Elapsed Days: 43
Plan Fractions Treated to Date: 4
Plan Prescribed Dose Per Fraction: 2 Gy
Plan Total Fractions Prescribed: 15
Plan Total Prescribed Dose: 30 Gy
Reference Point Dosage Given to Date: 8 Gy
Reference Point Session Dosage Given: 2 Gy
Session Number: 29

## 2024-01-12 ENCOUNTER — Other Ambulatory Visit: Payer: Self-pay

## 2024-01-12 ENCOUNTER — Ambulatory Visit
Admission: RE | Admit: 2024-01-12 | Discharge: 2024-01-12 | Disposition: A | Discharge status: Home / Self Care | Source: Ambulatory Visit | Attending: Radiation Oncology | Admitting: Radiation Oncology

## 2024-01-12 DIAGNOSIS — C7951 Secondary malignant neoplasm of bone: Secondary | ICD-10-CM | POA: Diagnosis not present

## 2024-01-12 DIAGNOSIS — Z191 Hormone sensitive malignancy status: Secondary | ICD-10-CM | POA: Diagnosis not present

## 2024-01-12 DIAGNOSIS — C61 Malignant neoplasm of prostate: Secondary | ICD-10-CM | POA: Diagnosis not present

## 2024-01-12 LAB — RAD ONC ARIA SESSION SUMMARY
Course Elapsed Days: 44
Plan Fractions Treated to Date: 5
Plan Prescribed Dose Per Fraction: 2 Gy
Plan Total Fractions Prescribed: 15
Plan Total Prescribed Dose: 30 Gy
Reference Point Dosage Given to Date: 10 Gy
Reference Point Session Dosage Given: 2 Gy
Session Number: 30

## 2024-01-13 ENCOUNTER — Ambulatory Visit

## 2024-01-16 ENCOUNTER — Ambulatory Visit: Admitting: Internal Medicine

## 2024-01-16 ENCOUNTER — Other Ambulatory Visit: Payer: Self-pay

## 2024-01-16 ENCOUNTER — Ambulatory Visit
Admission: RE | Admit: 2024-01-16 | Discharge: 2024-01-16 | Disposition: A | Source: Ambulatory Visit | Attending: Radiation Oncology

## 2024-01-16 DIAGNOSIS — C61 Malignant neoplasm of prostate: Secondary | ICD-10-CM | POA: Diagnosis not present

## 2024-01-16 LAB — RAD ONC ARIA SESSION SUMMARY
Course Elapsed Days: 48
Plan Fractions Treated to Date: 6
Plan Prescribed Dose Per Fraction: 2 Gy
Plan Total Fractions Prescribed: 15
Plan Total Prescribed Dose: 30 Gy
Reference Point Dosage Given to Date: 12 Gy
Reference Point Session Dosage Given: 2 Gy
Session Number: 31

## 2024-01-17 ENCOUNTER — Ambulatory Visit
Admission: RE | Admit: 2024-01-17 | Discharge: 2024-01-17 | Disposition: A | Discharge status: Home / Self Care | Source: Ambulatory Visit | Attending: Radiation Oncology | Admitting: Radiation Oncology

## 2024-01-17 ENCOUNTER — Other Ambulatory Visit: Payer: Self-pay

## 2024-01-17 DIAGNOSIS — Z191 Hormone sensitive malignancy status: Secondary | ICD-10-CM | POA: Diagnosis not present

## 2024-01-17 DIAGNOSIS — Z510 Encounter for antineoplastic radiation therapy: Secondary | ICD-10-CM | POA: Diagnosis not present

## 2024-01-17 DIAGNOSIS — C61 Malignant neoplasm of prostate: Secondary | ICD-10-CM | POA: Diagnosis not present

## 2024-01-17 DIAGNOSIS — C7951 Secondary malignant neoplasm of bone: Secondary | ICD-10-CM | POA: Diagnosis not present

## 2024-01-17 LAB — RAD ONC ARIA SESSION SUMMARY
Course Elapsed Days: 49
Plan Fractions Treated to Date: 7
Plan Prescribed Dose Per Fraction: 2 Gy
Plan Total Fractions Prescribed: 15
Plan Total Prescribed Dose: 30 Gy
Reference Point Dosage Given to Date: 14 Gy
Reference Point Session Dosage Given: 2 Gy
Session Number: 32

## 2024-01-18 ENCOUNTER — Ambulatory Visit
Admission: RE | Admit: 2024-01-18 | Discharge: 2024-01-18 | Disposition: A | Source: Ambulatory Visit | Attending: Radiation Oncology

## 2024-01-18 ENCOUNTER — Other Ambulatory Visit: Payer: Self-pay

## 2024-01-18 DIAGNOSIS — Z510 Encounter for antineoplastic radiation therapy: Secondary | ICD-10-CM | POA: Diagnosis not present

## 2024-01-18 DIAGNOSIS — C61 Malignant neoplasm of prostate: Secondary | ICD-10-CM | POA: Diagnosis not present

## 2024-01-18 LAB — RAD ONC ARIA SESSION SUMMARY
Course Elapsed Days: 50
Plan Fractions Treated to Date: 8
Plan Prescribed Dose Per Fraction: 2 Gy
Plan Total Fractions Prescribed: 15
Plan Total Prescribed Dose: 30 Gy
Reference Point Dosage Given to Date: 16 Gy
Reference Point Session Dosage Given: 2 Gy
Session Number: 33

## 2024-01-19 ENCOUNTER — Ambulatory Visit
Admission: RE | Admit: 2024-01-19 | Discharge: 2024-01-19 | Disposition: A | Source: Ambulatory Visit | Attending: Radiation Oncology

## 2024-01-19 ENCOUNTER — Other Ambulatory Visit: Payer: Self-pay

## 2024-01-19 ENCOUNTER — Ambulatory Visit

## 2024-01-19 DIAGNOSIS — Z510 Encounter for antineoplastic radiation therapy: Secondary | ICD-10-CM | POA: Diagnosis not present

## 2024-01-19 DIAGNOSIS — C61 Malignant neoplasm of prostate: Secondary | ICD-10-CM | POA: Diagnosis not present

## 2024-01-19 DIAGNOSIS — C7951 Secondary malignant neoplasm of bone: Secondary | ICD-10-CM | POA: Diagnosis not present

## 2024-01-19 DIAGNOSIS — Z191 Hormone sensitive malignancy status: Secondary | ICD-10-CM | POA: Diagnosis not present

## 2024-01-19 LAB — RAD ONC ARIA SESSION SUMMARY
Course Elapsed Days: 51
Plan Fractions Treated to Date: 9
Plan Prescribed Dose Per Fraction: 2 Gy
Plan Total Fractions Prescribed: 15
Plan Total Prescribed Dose: 30 Gy
Reference Point Dosage Given to Date: 18 Gy
Reference Point Session Dosage Given: 2 Gy
Session Number: 34

## 2024-01-20 ENCOUNTER — Ambulatory Visit
Admission: RE | Admit: 2024-01-20 | Discharge: 2024-01-20 | Disposition: A | Discharge status: Home / Self Care | Source: Ambulatory Visit | Attending: Radiation Oncology | Admitting: Radiation Oncology

## 2024-01-20 ENCOUNTER — Other Ambulatory Visit: Payer: Self-pay

## 2024-01-20 DIAGNOSIS — Z191 Hormone sensitive malignancy status: Secondary | ICD-10-CM | POA: Diagnosis not present

## 2024-01-20 DIAGNOSIS — C61 Malignant neoplasm of prostate: Secondary | ICD-10-CM | POA: Diagnosis not present

## 2024-01-20 DIAGNOSIS — Z510 Encounter for antineoplastic radiation therapy: Secondary | ICD-10-CM | POA: Diagnosis not present

## 2024-01-20 DIAGNOSIS — C7951 Secondary malignant neoplasm of bone: Secondary | ICD-10-CM | POA: Diagnosis not present

## 2024-01-20 LAB — RAD ONC ARIA SESSION SUMMARY
Course Elapsed Days: 52
Plan Fractions Treated to Date: 10
Plan Prescribed Dose Per Fraction: 2 Gy
Plan Total Fractions Prescribed: 15
Plan Total Prescribed Dose: 30 Gy
Reference Point Dosage Given to Date: 20 Gy
Reference Point Session Dosage Given: 2 Gy
Session Number: 35

## 2024-01-23 ENCOUNTER — Other Ambulatory Visit: Payer: Self-pay

## 2024-01-23 ENCOUNTER — Ambulatory Visit
Admission: RE | Admit: 2024-01-23 | Discharge: 2024-01-23 | Disposition: A | Discharge status: Home / Self Care | Source: Ambulatory Visit | Attending: Radiation Oncology | Admitting: Radiation Oncology

## 2024-01-23 ENCOUNTER — Ambulatory Visit

## 2024-01-23 DIAGNOSIS — C61 Malignant neoplasm of prostate: Secondary | ICD-10-CM

## 2024-01-23 LAB — RAD ONC ARIA SESSION SUMMARY
Course Elapsed Days: 55
Plan Fractions Treated to Date: 11
Plan Prescribed Dose Per Fraction: 2 Gy
Plan Total Fractions Prescribed: 15
Plan Total Prescribed Dose: 30 Gy
Reference Point Dosage Given to Date: 22 Gy
Reference Point Session Dosage Given: 2 Gy
Session Number: 36

## 2024-01-23 NOTE — Progress Notes (Signed)
 RN spoke with patient - patient had several questions related to post treatment PSA monitoring.  All questions answered.  Referral placed to Dr. Tina for second opinion for his Stage IV, oligometastatic prostate cancer with Gleason score of 4+4, PSA of 23.9 and disease in a pelvic lymph node and the skeleton.  RN will also coordinate for follow up visit with Dr. Nieves due to ongoing urinary incontinence after aquablation TURP.

## 2024-01-24 ENCOUNTER — Ambulatory Visit

## 2024-01-25 ENCOUNTER — Ambulatory Visit
Admission: RE | Admit: 2024-01-25 | Discharge: 2024-01-25 | Disposition: A | Source: Ambulatory Visit | Attending: Radiation Oncology

## 2024-01-25 ENCOUNTER — Other Ambulatory Visit: Payer: Self-pay

## 2024-01-25 ENCOUNTER — Ambulatory Visit

## 2024-01-25 DIAGNOSIS — C61 Malignant neoplasm of prostate: Secondary | ICD-10-CM | POA: Diagnosis not present

## 2024-01-25 LAB — RAD ONC ARIA SESSION SUMMARY
Course Elapsed Days: 57
Plan Fractions Treated to Date: 12
Plan Prescribed Dose Per Fraction: 2 Gy
Plan Total Fractions Prescribed: 15
Plan Total Prescribed Dose: 30 Gy
Reference Point Dosage Given to Date: 24 Gy
Reference Point Session Dosage Given: 2 Gy
Session Number: 37

## 2024-01-26 ENCOUNTER — Ambulatory Visit

## 2024-01-26 ENCOUNTER — Other Ambulatory Visit: Payer: Self-pay

## 2024-01-26 ENCOUNTER — Ambulatory Visit
Admission: RE | Admit: 2024-01-26 | Discharge: 2024-01-26 | Disposition: A | Source: Ambulatory Visit | Attending: Radiation Oncology

## 2024-01-26 DIAGNOSIS — Z510 Encounter for antineoplastic radiation therapy: Secondary | ICD-10-CM | POA: Diagnosis not present

## 2024-01-26 DIAGNOSIS — C61 Malignant neoplasm of prostate: Secondary | ICD-10-CM | POA: Diagnosis not present

## 2024-01-26 LAB — RAD ONC ARIA SESSION SUMMARY
Course Elapsed Days: 58
Plan Fractions Treated to Date: 13
Plan Prescribed Dose Per Fraction: 2 Gy
Plan Total Fractions Prescribed: 15
Plan Total Prescribed Dose: 30 Gy
Reference Point Dosage Given to Date: 26 Gy
Reference Point Session Dosage Given: 2 Gy
Session Number: 38

## 2024-01-27 ENCOUNTER — Ambulatory Visit
Admission: RE | Admit: 2024-01-27 | Discharge: 2024-01-27 | Disposition: A | Discharge status: Home / Self Care | Source: Ambulatory Visit | Attending: Radiation Oncology | Admitting: Radiation Oncology

## 2024-01-27 ENCOUNTER — Ambulatory Visit

## 2024-01-27 ENCOUNTER — Other Ambulatory Visit: Payer: Self-pay

## 2024-01-27 DIAGNOSIS — R112 Nausea with vomiting, unspecified: Secondary | ICD-10-CM | POA: Insufficient documentation

## 2024-01-27 DIAGNOSIS — R110 Nausea: Secondary | ICD-10-CM | POA: Insufficient documentation

## 2024-01-27 DIAGNOSIS — C61 Malignant neoplasm of prostate: Secondary | ICD-10-CM | POA: Insufficient documentation

## 2024-01-27 LAB — RAD ONC ARIA SESSION SUMMARY
Course Elapsed Days: 59
Plan Fractions Treated to Date: 14
Plan Prescribed Dose Per Fraction: 2 Gy
Plan Total Fractions Prescribed: 15
Plan Total Prescribed Dose: 30 Gy
Reference Point Dosage Given to Date: 28 Gy
Reference Point Session Dosage Given: 2 Gy
Session Number: 39

## 2024-01-27 MED ORDER — ONDANSETRON 4 MG PO TBDP
ORAL_TABLET | ORAL | Status: AC
  Filled 2024-01-27: qty 1

## 2024-01-27 MED ORDER — ONDANSETRON 4 MG PO TBDP
4.0000 mg | ORAL_TABLET | Freq: Once | ORAL | Status: AC
  Administered 2024-01-27: 4 mg via ORAL

## 2024-01-30 ENCOUNTER — Other Ambulatory Visit: Payer: Self-pay

## 2024-01-30 ENCOUNTER — Ambulatory Visit
Admission: RE | Admit: 2024-01-30 | Discharge: 2024-01-30 | Disposition: A | Discharge status: Home / Self Care | Source: Ambulatory Visit | Attending: Radiation Oncology | Admitting: Radiation Oncology

## 2024-01-30 ENCOUNTER — Encounter: Payer: Self-pay | Admitting: Radiology

## 2024-01-30 DIAGNOSIS — C61 Malignant neoplasm of prostate: Secondary | ICD-10-CM | POA: Insufficient documentation

## 2024-01-30 DIAGNOSIS — Z191 Hormone sensitive malignancy status: Secondary | ICD-10-CM | POA: Diagnosis not present

## 2024-01-30 DIAGNOSIS — C7951 Secondary malignant neoplasm of bone: Secondary | ICD-10-CM | POA: Diagnosis not present

## 2024-01-30 DIAGNOSIS — Z510 Encounter for antineoplastic radiation therapy: Secondary | ICD-10-CM | POA: Diagnosis not present

## 2024-01-30 LAB — RAD ONC ARIA SESSION SUMMARY
Course Elapsed Days: 62
Plan Fractions Treated to Date: 15
Plan Prescribed Dose Per Fraction: 2 Gy
Plan Total Fractions Prescribed: 15
Plan Total Prescribed Dose: 30 Gy
Reference Point Dosage Given to Date: 30 Gy
Reference Point Session Dosage Given: 2 Gy
Session Number: 40

## 2024-02-01 NOTE — Radiation Completion Notes (Addendum)
" °  Radiation Oncology         [phone] ________________________________  Name: Ronald King MRN: [id number]  Date: 01/30/2024  DOB: Mar 05, 1946  Referring Physician: DONNICE BROOKS, M.D. Date of Service: 2024-02-01 Radiation Oncologist: Adina Barge, M.D. [facility]     RADIATION ONCOLOGY END OF TREATMENT NOTE     Diagnosis: 78 y.o. 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   Intent: Curative     ==========DELIVERED PLANS==========  First Treatment Date: 2023-11-29 Last Treatment Date: 2024-01-30   Plan Name: Prostate_Pelv Site: Prostate Technique: IMRT Mode: Photon Dose Per Fraction: 1.8 Gy Prescribed Dose (Delivered / Prescribed): 45 Gy / 45 Gy Prescribed Fxs (Delivered / Prescribed): 25 / 25   Plan Name: Prostate_Bst Site: Prostate Technique: IMRT Mode: Photon Dose Per Fraction: 2 Gy Prescribed Dose (Delivered / Prescribed): 30 Gy / 30 Gy Prescribed Fxs (Delivered / Prescribed): 15 / 15     ==========ON TREATMENT VISIT DATES========== 2023-12-02, 2023-12-08, 2023-12-16, 2023-12-22, 2023-12-30, 2024-01-05, 2024-01-12, 2024-01-19, 2024-01-27   See weekly On Treatment Notes in Epic for details in the Media tab (listed as Progress notes on the On Treatment Visit Dates listed above).  He tolerated the radiation treatments relatively well with only mild increased LUTS and modest fatigue.  The patient will receive a call in about one month from the radiation oncology department. He will continue follow up with Dr. Brooks as well.  ------------------------------------------------   Donnice Barge, MD [facility]  Radiation Oncology Direct Dial: [phone]  Fax: [phone] [email or web address]  Skype  LinkedIn    "

## 2024-02-01 NOTE — H&P (View-Only) (Signed)
 HPI: Follow-up atrial fibrillation and AS.  Found to be in atrial fibrillation on 07/25/23 ECG which was a new diagnosis.  Echocardiogram May 2025 showed normal LV function, mild left ventricular hypertrophy, mild aortic stenosis with mean gradient 16.5 mmHg.  DCCV delayed as pt recently had thyroidectomy and radiation seed implants for prostate cancer. Since last seen patient denies dyspnea, chest pain, palpitations or syncope.  No bleeding.  Current Outpatient Medications  Medication Sig Dispense Refill   amLODipine  (NORVASC ) 10 MG tablet TAKE 1 TABLET(10 MG) BY MOUTH DAILY 90 tablet 1   apixaban  (ELIQUIS ) 5 MG TABS tablet Take 1 tablet (5 mg total) by mouth 2 (two) times daily.     aspirin  EC 81 MG tablet Take 1 tablet (81 mg total) by mouth daily. Swallow whole.     Bioflavonoid Products (VITAMIN C) CHEW Chew 1 each by mouth in the morning.     Calcium -Magnesium  (CAL-MAG PO) Take 1 capsule by mouth in the morning.     Cholecalciferol (VITAMIN D3 PO) Take 1,000 Units by mouth in the morning.     clonazePAM  (KLONOPIN ) 1 MG tablet Take 1 tablet (1 mg total) by mouth 2 (two) times daily as needed for anxiety. 60 tablet 1   Coenzyme Q10-Vitamin E (QUNOL ULTRA COQ10 PO) Take 1 capsule by mouth in the morning.     cyanocobalamin (VITAMIN B12) 1000 MCG tablet Take 1,000 mcg by mouth in the morning.     Dulaglutide (TRULICITY) 0.75 MG/0.5ML SOPN Inject 0.75 mg into the skin every Thursday.     fenofibrate  (TRICOR ) 145 MG tablet Take 1 tablet (145 mg total) by mouth daily. 90 tablet 1   GEMTESA 75 MG TABS Take 1 tablet by mouth daily.     lansoprazole  (PREVACID ) 30 MG capsule TAKE 1 CAPSULE(30 MG) BY MOUTH TWICE DAILY BEFORE A MEAL (Patient taking differently: Take 30 mg by mouth See admin instructions. Take 1 capsule (30 mg) by mouth scheduled every morning, may repeat dose (30 mg) in the afternoon/evening--if needed for acid reflux/indigestion.) 120 capsule 0   leuprolide , 6 Month, (ELIGARD ) 45  MG injection Inject 45 mg into the skin every 6 (six) months.     levothyroxine  (SYNTHROID ) 112 MCG tablet Take 1.5 tablets (168 mcg total) by mouth daily at 6 (six) AM. 45 tablet 3   Lysine 500 MG CAPS Take 500 mg by mouth in the morning.     Multiple Vitamin (MULTIVITAMIN WITH MINERALS) TABS tablet Take 1 tablet by mouth daily. Centrum Silver for Men 50+     NUBEQA 300 MG tablet Take 600 mg by mouth 2 (two) times daily.     ondansetron  (ZOFRAN ) 4 MG tablet Take 1 tablet (4 mg total) by mouth every 8 (eight) hours as needed for nausea or vomiting. 20 tablet 3   oxymetazoline (AFRIN) 0.05 % nasal spray Place 1 spray into both nostrils 2 (two) times daily as needed for congestion.     PARoxetine  (PAXIL ) 20 MG tablet TAKE 1 TABLET(20 MG) BY MOUTH DAILY 90 tablet 1   pyridOXINE (VITAMIN B-6) 100 MG tablet Take 100 mg by mouth daily.     rosuvastatin  (CRESTOR ) 40 MG tablet Take 1 tablet (40 mg total) by mouth at bedtime. 90 tablet 1   tamsulosin (FLOMAX) 0.4 MG CAPS capsule Take 0.4 mg by mouth at bedtime.     budesonide  (PULMICORT ) 0.5 MG/2ML nebulizer solution Take 2 mLs (0.5 mg total) by nebulization 2 (two) times daily as needed (  shortness of breath, wheezing, cough). (Patient not taking: Reported on 02/08/2024) 75 mL 5   phenazopyridine (PYRIDIUM) 97 MG tablet Take 97 mg by mouth 2 (two) times daily as needed for pain.     No current facility-administered medications for this visit.     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   Heart murmur    Valve stenosis,  ECHO 01-2021   Hemorrhoids, internal    History of gastrointestinal  hemorrhage    Hypertension    Hypogonadism, male    per Dr Faythe   Hypothyroidism    Nausea with vomiting 01/27/2024   Neuromuscular disorder (HCC)    neuropathy feet   Neuropathy    per neuro-Dr Lester vicodin   OSA (obstructive sleep apnea)    on CPAP   Sleep apnea    CPAP at night   Thyroid  nodule    Vitamin D  deficiency     Past Surgical History:  Procedure Laterality Date   ANAL FISSURE REPAIR     BIOPSY THYROID      GOLD SEED IMPLANT N/A 11/11/2023   Procedure: INSERTION, GOLD SEEDS;  Surgeon: Selma Donnice SAUNDERS, MD;  Location: [facility];  Service: Urology;  Laterality: N/A;   hand fracture surgery     left   KNEE ARTHROSCOPY Left 2007   Dr. Addie   LAPAROSCOPIC NISSEN FUNDOPLICATION  05/1999   Dr. Gladis   NOSE SURGERY     PROSTATE BIOPSY     SPACE OAR INSTILLATION N/A 11/11/2023   Procedure: INJECTION, HYDROGEL SPACER;  Surgeon: Selma Donnice SAUNDERS, MD;  Location: [facility] OR;  Service: Urology;  Laterality: N/A;   THYROIDECTOMY N/A 10/14/2023   Procedure: THYROIDECTOMY;  Surgeon: Polly Cordella LABOR, MD;  Location: [facility] ORS;  Service: General;  Laterality: N/A;  TOTAL THYROIDECTOMY   TONSILLECTOMY     UPPER GASTROINTESTINAL ENDOSCOPY      Social History   Socioeconomic History   Marital status: Married    Spouse name: Not on file   Number of children: 1   Years of education: Not on file   Highest education level: 12th grade  Occupational History   Occupation: regulatory affairs officer, retired    Comment: retired   Occupation: minister    Comment: [employer]  Tobacco Use   Smoking status: Former    Current packs/day: 0.00    Average packs/day: 1 pack/day for 3.0 years (3.0 ttl pk-yrs)    Types: Cigarettes    Start date: 03/29/1962    Quit date: 03/29/1965    Years since quitting: 58.9   Smokeless tobacco: Never  Vaping Use   Vaping status: Never Used  Substance and Sexual Activity   Alcohol use: No   Drug use: No   Sexual activity: Not Currently  Other  Topics Concern   Not on file  Social History Narrative   Lives w/ wife   Social Drivers of Health   Financial Resource Strain: Low Risk  (09/01/2023)   Overall Financial Resource  Strain (CARDIA)    Difficulty of Paying Living Expenses: Not very hard  Food Insecurity: No Food Insecurity (10/15/2023)   Hunger Vital Sign    Worried About Running Out of Food in the Last Year: Never true    Ran Out of Food in the Last Year: Never true  Transportation Needs: No Transportation Needs (10/15/2023)   PRAPARE - Administrator, Civil Service (Medical): No    Lack of Transportation (Non-Medical): No  Physical Activity: Inactive (09/01/2023)   Exercise Vital Sign    Days of Exercise per Week: 0 days    Minutes of Exercise per Session: 0 min  Stress: Stress Concern Present (09/01/2023)   [employer] of Occupational Health - Occupational Stress Questionnaire    Feeling of Stress : To some extent  Social Connections: Moderately Integrated (10/14/2023)   Social Connection and Isolation Panel    Frequency of Communication with Friends and Family: More than three times a week    Frequency of Social Gatherings with Friends and Family: More than three times a week    Attends Religious Services: More than 4 times per year    Active Member of [employer] or Organizations: No    Attends Banker Meetings: Never    Marital Status: Married  Catering Manager Violence: Not At Risk (10/15/2023)   Humiliation, Afraid, Rape, and Kick questionnaire    Fear of Current or Ex-Partner: No    Emotionally Abused: No    Physically Abused: No    Sexually Abused: No    Family History  Problem Relation Age of Onset   Cerebral aneurysm Father    Alcohol abuse Father    Heart disease Father        brain aneurism   Neuropathy Mother    Heart disease Mother        died at age 48   Migraines Brother        several   Heart disease Brother        heart attack   Diabetes Maternal Uncle    CAD  Brother        CABG in his 10s   Colon cancer Neg Hx    Prostate cancer Neg Hx    Esophageal cancer Neg Hx    Stomach cancer Neg Hx    Rectal cancer Neg Hx     ROS: Fatigue following recent surgeries but no fevers or chills, productive cough, hemoptysis, dysphasia, odynophagia, melena, hematochezia, dysuria, hematuria, rash, seizure activity, orthopnea, PND, pedal edema, claudication. Remaining systems are negative.  Physical Exam: Well-developed well-nourished in no acute distress.  Skin is warm and dry.  HEENT is normal.  Neck is supple.  Chest is clear to auscultation with normal expansion.  Cardiovascular exam is irregular, 2/6 systolic murmur left sternal border. Abdominal exam nontender or distended. No masses palpated. Extremities show no edema. neuro grossly intact       A/P  1 persistent atrial fibrillation-patient's heart rate remains controlled on no medications.  Had recent thyroidectomy and radiation seed implants.  He is back on apixaban  for greater than 3 weeks and has missed no doses.  Plan to proceed with cardioversion to see if he will hold sinus rhythm.  If not we will consider antiarrhythmic versus ablation.  2 aortic stenosis-mild on most recent echocardiogram.  Will repeat study May 2026.  3 hypertension-BP controlled.  Continue present medical regimen.  4 Sleep apnea-continue CPAP.  5 hyperlipidemia-continue Crestor .  Redell Shallow, MD

## 2024-02-01 NOTE — Progress Notes (Signed)
 HPI: Follow-up atrial fibrillation and AS.  Found to be in atrial fibrillation on 07/25/23 ECG which was a new diagnosis.  Echocardiogram May 2025 showed normal LV function, mild left ventricular hypertrophy, mild aortic stenosis with mean gradient 16.5 mmHg.  DCCV delayed as pt recently had thyroidectomy and radiation seed implants for prostate cancer. Since last seen patient denies dyspnea, chest pain, palpitations or syncope.  No bleeding.  Current Outpatient Medications  Medication Sig Dispense Refill   amLODipine  (NORVASC ) 10 MG tablet TAKE 1 TABLET(10 MG) BY MOUTH DAILY 90 tablet 1   apixaban  (ELIQUIS ) 5 MG TABS tablet Take 1 tablet (5 mg total) by mouth 2 (two) times daily.     aspirin  EC 81 MG tablet Take 1 tablet (81 mg total) by mouth daily. Swallow whole.     Bioflavonoid Products (VITAMIN C) CHEW Chew 1 each by mouth in the morning.     Calcium -Magnesium  (CAL-MAG PO) Take 1 capsule by mouth in the morning.     Cholecalciferol (VITAMIN D3 PO) Take 1,000 Units by mouth in the morning.     clonazePAM  (KLONOPIN ) 1 MG tablet Take 1 tablet (1 mg total) by mouth 2 (two) times daily as needed for anxiety. 60 tablet 1   Coenzyme Q10-Vitamin E (QUNOL ULTRA COQ10 PO) Take 1 capsule by mouth in the morning.     cyanocobalamin (VITAMIN B12) 1000 MCG tablet Take 1,000 mcg by mouth in the morning.     Dulaglutide (TRULICITY) 0.75 MG/0.5ML SOPN Inject 0.75 mg into the skin every Thursday.     fenofibrate  (TRICOR ) 145 MG tablet Take 1 tablet (145 mg total) by mouth daily. 90 tablet 1   GEMTESA 75 MG TABS Take 1 tablet by mouth daily.     lansoprazole  (PREVACID ) 30 MG capsule TAKE 1 CAPSULE(30 MG) BY MOUTH TWICE DAILY BEFORE A MEAL (Patient taking differently: Take 30 mg by mouth See admin instructions. Take 1 capsule (30 mg) by mouth scheduled every morning, may repeat dose (30 mg) in the afternoon/evening--if needed for acid reflux/indigestion.) 120 capsule 0   leuprolide , 6 Month, (ELIGARD ) 45  MG injection Inject 45 mg into the skin every 6 (six) months.     levothyroxine  (SYNTHROID ) 112 MCG tablet Take 1.5 tablets (168 mcg total) by mouth daily at 6 (six) AM. 45 tablet 3   Lysine 500 MG CAPS Take 500 mg by mouth in the morning.     Multiple Vitamin (MULTIVITAMIN WITH MINERALS) TABS tablet Take 1 tablet by mouth daily. Centrum Silver for Men 50+     NUBEQA 300 MG tablet Take 600 mg by mouth 2 (two) times daily.     ondansetron  (ZOFRAN ) 4 MG tablet Take 1 tablet (4 mg total) by mouth every 8 (eight) hours as needed for nausea or vomiting. 20 tablet 3   oxymetazoline (AFRIN) 0.05 % nasal spray Place 1 spray into both nostrils 2 (two) times daily as needed for congestion.     PARoxetine  (PAXIL ) 20 MG tablet TAKE 1 TABLET(20 MG) BY MOUTH DAILY 90 tablet 1   pyridOXINE (VITAMIN B-6) 100 MG tablet Take 100 mg by mouth daily.     rosuvastatin  (CRESTOR ) 40 MG tablet Take 1 tablet (40 mg total) by mouth at bedtime. 90 tablet 1   tamsulosin (FLOMAX) 0.4 MG CAPS capsule Take 0.4 mg by mouth at bedtime.     budesonide  (PULMICORT ) 0.5 MG/2ML nebulizer solution Take 2 mLs (0.5 mg total) by nebulization 2 (two) times daily as needed (  shortness of breath, wheezing, cough). (Patient not taking: Reported on 02/08/2024) 75 mL 5   phenazopyridine (PYRIDIUM) 97 MG tablet Take 97 mg by mouth 2 (two) times daily as needed for pain.     No current facility-administered medications for this visit.     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   Heart murmur    Valve stenosis,  ECHO 01-2021   Hemorrhoids, internal    History of gastrointestinal  hemorrhage    Hypertension    Hypogonadism, male    per Dr Faythe   Hypothyroidism    Nausea with vomiting 01/27/2024   Neuromuscular disorder (HCC)    neuropathy feet   Neuropathy    per neuro-Dr Lester vicodin   OSA (obstructive sleep apnea)    on CPAP   Sleep apnea    CPAP at night   Thyroid  nodule    Vitamin D  deficiency     Past Surgical History:  Procedure Laterality Date   ANAL FISSURE REPAIR     BIOPSY THYROID      GOLD SEED IMPLANT N/A 11/11/2023   Procedure: INSERTION, GOLD SEEDS;  Surgeon: Selma Donnice SAUNDERS, MD;  Location: [facility] OR;  Service: Urology;  Laterality: N/A;   hand fracture surgery     left   KNEE ARTHROSCOPY Left 2007   Dr. Addie   LAPAROSCOPIC NISSEN FUNDOPLICATION  05/1999   Dr. Gladis   NOSE SURGERY     PROSTATE BIOPSY     SPACE OAR INSTILLATION N/A 11/11/2023   Procedure: INJECTION, HYDROGEL SPACER;  Surgeon: Selma Donnice SAUNDERS, MD;  Location: [facility] OR;  Service: Urology;  Laterality: N/A;   THYROIDECTOMY N/A 10/14/2023   Procedure: THYROIDECTOMY;  Surgeon: Polly Cordella LABOR, MD;  Location: [facility] ORS;  Service: General;  Laterality: N/A;  TOTAL THYROIDECTOMY   TONSILLECTOMY     UPPER GASTROINTESTINAL ENDOSCOPY      Social History   Socioeconomic History   Marital status: Married    Spouse name: Not on file   Number of children: 1   Years of education: Not on file   Highest education level: 12th grade  Occupational History   Occupation: regulatory affairs officer, retired    Comment: retired   Occupation: minister    Comment: [employer]  Tobacco Use   Smoking status: Former    Current packs/day: 0.00    Average packs/day: 1 pack/day for 3.0 years (3.0 ttl pk-yrs)    Types: Cigarettes    Start date: 03/29/1962    Quit date: 03/29/1965    Years since quitting: 58.9   Smokeless tobacco: Never  Vaping Use   Vaping status: Never Used  Substance and Sexual Activity   Alcohol use: No   Drug use: No   Sexual activity: Not Currently  Other  Topics Concern   Not on file  Social History Narrative   Lives w/ wife   Social Drivers of Health   Financial Resource Strain: Low Risk  (09/01/2023)   Overall Financial Resource  Strain (CARDIA)    Difficulty of Paying Living Expenses: Not very hard  Food Insecurity: No Food Insecurity (10/15/2023)   Hunger Vital Sign    Worried About Running Out of Food in the Last Year: Never true    Ran Out of Food in the Last Year: Never true  Transportation Needs: No Transportation Needs (10/15/2023)   PRAPARE - Administrator, Civil Service (Medical): No    Lack of Transportation (Non-Medical): No  Physical Activity: Inactive (09/01/2023)   Exercise Vital Sign    Days of Exercise per Week: 0 days    Minutes of Exercise per Session: 0 min  Stress: Stress Concern Present (09/01/2023)   [employer] of Occupational Health - Occupational Stress Questionnaire    Feeling of Stress : To some extent  Social Connections: Moderately Integrated (10/14/2023)   Social Connection and Isolation Panel    Frequency of Communication with Friends and Family: More than three times a week    Frequency of Social Gatherings with Friends and Family: More than three times a week    Attends Religious Services: More than 4 times per year    Active Member of [employer] or Organizations: No    Attends Banker Meetings: Never    Marital Status: Married  Catering Manager Violence: Not At Risk (10/15/2023)   Humiliation, Afraid, Rape, and Kick questionnaire    Fear of Current or Ex-Partner: No    Emotionally Abused: No    Physically Abused: No    Sexually Abused: No    Family History  Problem Relation Age of Onset   Cerebral aneurysm Father    Alcohol abuse Father    Heart disease Father        brain aneurism   Neuropathy Mother    Heart disease Mother        died at age 48   Migraines Brother        several   Heart disease Brother        heart attack   Diabetes Maternal Uncle    CAD  Brother        CABG in his 10s   Colon cancer Neg Hx    Prostate cancer Neg Hx    Esophageal cancer Neg Hx    Stomach cancer Neg Hx    Rectal cancer Neg Hx     ROS: Fatigue following recent surgeries but no fevers or chills, productive cough, hemoptysis, dysphasia, odynophagia, melena, hematochezia, dysuria, hematuria, rash, seizure activity, orthopnea, PND, pedal edema, claudication. Remaining systems are negative.  Physical Exam: Well-developed well-nourished in no acute distress.  Skin is warm and dry.  HEENT is normal.  Neck is supple.  Chest is clear to auscultation with normal expansion.  Cardiovascular exam is irregular, 2/6 systolic murmur left sternal border. Abdominal exam nontender or distended. No masses palpated. Extremities show no edema. neuro grossly intact       A/P  1 persistent atrial fibrillation-patient's heart rate remains controlled on no medications.  Had recent thyroidectomy and radiation seed implants.  He is back on apixaban  for greater than 3 weeks and has missed no doses.  Plan to proceed with cardioversion to see if he will hold sinus rhythm.  If not we will consider antiarrhythmic versus ablation.  2 aortic stenosis-mild on most recent echocardiogram.  Will repeat study May 2026.  3 hypertension-BP controlled.  Continue present medical regimen.  4 Sleep apnea-continue CPAP.  5 hyperlipidemia-continue Crestor .  Redell Shallow, MD

## 2024-02-06 NOTE — Progress Notes (Addendum)
 Patient completed his radiation treatment on 01/30/24.  Patient is now scheduled for PSA check on 02/09/24 and will follow up with Dr. Nieves on 02/15/24.   RN will follow up with patient after MD visit.  Patient wishes to hold off on med onc consult until after he has PSA and see's Dr. Eskridge.

## 2024-02-08 ENCOUNTER — Other Ambulatory Visit: Payer: Self-pay | Admitting: *Deleted

## 2024-02-08 ENCOUNTER — Ambulatory Visit: Attending: Cardiology | Admitting: Cardiology

## 2024-02-08 ENCOUNTER — Encounter: Payer: Self-pay | Admitting: Cardiology

## 2024-02-08 VITALS — BP 126/74 | HR 91 | Ht 73.0 in | Wt 247.8 lb

## 2024-02-08 DIAGNOSIS — I4819 Other persistent atrial fibrillation: Secondary | ICD-10-CM

## 2024-02-08 DIAGNOSIS — I10 Essential (primary) hypertension: Secondary | ICD-10-CM | POA: Diagnosis not present

## 2024-02-08 DIAGNOSIS — I350 Nonrheumatic aortic (valve) stenosis: Secondary | ICD-10-CM | POA: Diagnosis not present

## 2024-02-08 NOTE — Patient Instructions (Signed)
   Testing/Procedures:  Your physician has recommended that you have a Cardioversion (DCCV). Electrical Cardioversion uses a jolt of electricity to your heart either through paddles or wired patches attached to your chest. This is a controlled, usually prescheduled, procedure. Defibrillation is done under light anesthesia in the hospital, and you usually go home the day of the procedure. This is done to get your heart back into a normal rhythm. You are not awake for the procedure. Please see the instruction sheet given to you today.    Follow-Up: At [facility], you and your health needs are our priority.  As part of our continuing mission to provide you with exceptional heart care, our providers are all part of one team.  This team includes your primary Cardiologist (physician) and Advanced Practice Providers or APPs (Physician Assistants and Nurse Practitioners) who all work together to provide you with the care you need, when you need it.  Your next appointment:   3 month(s)  Provider:   Redell Shallow, MD      Other Instructions     You are scheduled for a Cardioversion on Thursday, November 20 with Dr. FRANCYNE.  Please arrive at the [facility] (Main Entrance A) at [facility]: [address] [place], [place] [address] at 10:00 AM (This time is 1 hour(s) before your procedure to ensure your preparation).   Free valet parking service is available. You will check in at ADMITTING.   *Please Note: You will receive a call the day before your procedure to confirm the appointment time. That time may have changed from the original time based on the schedule for that day.*    DIET:  Nothing to eat or drink after midnight except a sip of water  with medications (see medication instructions below)  MEDICATION INSTRUCTIONS: HOLD: Dulaglutide (Trulicity) for 7 days prior to the procedure.    Continue taking your anticoagulant (blood thinner): Apixaban  (Eliquis ).  You will  need to continue this after your procedure until you are told by your provider that it is safe to stop.    LABS: TODAY    FYI:  For your safety, and to allow us  to monitor your vital signs accurately during the surgery/procedure we request: If you have artificial nails, gel coating, SNS etc, please have those removed prior to your surgery/procedure. Not having the nail coverings /polish removed may result in cancellation or delay of your surgery/procedure.  Your support person will be asked to wait in the waiting room during your procedure.  It is OK to have someone drop you off and come back when you are ready to be discharged.  You cannot drive after the procedure and will need someone to drive you home.  Bring your insurance cards.  *Special Note: Every effort is made to have your procedure done on time. Occasionally there are emergencies that occur at the hospital that may cause delays. Please be patient if a delay does occur.

## 2024-02-09 DIAGNOSIS — I4819 Other persistent atrial fibrillation: Secondary | ICD-10-CM | POA: Diagnosis not present

## 2024-02-09 DIAGNOSIS — C61 Malignant neoplasm of prostate: Secondary | ICD-10-CM | POA: Diagnosis not present

## 2024-02-10 ENCOUNTER — Ambulatory Visit: Payer: Self-pay | Admitting: Cardiology

## 2024-02-10 LAB — BASIC METABOLIC PANEL WITH GFR
BUN/Creatinine Ratio: 17 (ref 10–24)
BUN: 19 mg/dL (ref 8–27)
CO2: 25 mmol/L (ref 20–29)
Calcium: 9.1 mg/dL (ref 8.6–10.2)
Chloride: 107 mmol/L — ABNORMAL HIGH (ref 96–106)
Creatinine, Ser: 1.1 mg/dL (ref 0.76–1.27)
Glucose: 92 mg/dL (ref 70–99)
Potassium: 4.3 mmol/L (ref 3.5–5.2)
Sodium: 142 mmol/L (ref 134–144)
eGFR: 69 mL/min/1.73 (ref >59)

## 2024-02-10 LAB — CBC
Hematocrit: 37.1 % — ABNORMAL LOW (ref 37.5–51.0)
Hemoglobin: 12.6 g/dL — ABNORMAL LOW (ref 13.0–17.7)
MCH: 32.2 pg (ref 26.6–33.0)
MCHC: 34 g/dL (ref 31.5–35.7)
MCV: 95 fL (ref 79–97)
Platelets: 192 x10E3/uL (ref 150–450)
RBC: 3.91 x10E6/uL — ABNORMAL LOW (ref 4.14–5.80)
RDW: 13.5 % (ref 11.6–15.4)
WBC: 4.8 x10E3/uL (ref 3.4–10.8)

## 2024-02-15 DIAGNOSIS — N401 Enlarged prostate with lower urinary tract symptoms: Secondary | ICD-10-CM | POA: Diagnosis not present

## 2024-02-15 DIAGNOSIS — R350 Frequency of micturition: Secondary | ICD-10-CM | POA: Diagnosis not present

## 2024-02-15 DIAGNOSIS — C61 Malignant neoplasm of prostate: Secondary | ICD-10-CM | POA: Diagnosis not present

## 2024-02-15 NOTE — Anesthesia Preprocedure Evaluation (Signed)
 Anesthesia Evaluation  Patient identified by MRN, date of birth, ID band Patient awake    Reviewed: Allergy  & Precautions, NPO status , Patient's Chart, lab work & pertinent test results  History of Anesthesia Complications (+) DIFFICULT AIRWAY and history of anesthetic complications  Airway Mallampati: III  TM Distance: >3 FB Neck ROM: Full    Dental  (+) Chipped, Teeth Intact,    Pulmonary shortness of breath, sleep apnea and Continuous Positive Airway Pressure Ventilation , former smoker   Pulmonary exam normal        Cardiovascular hypertension, Pt. on medications (-) angina (-) Past MI Normal cardiovascular exam+ dysrhythmias (on apixiban) Atrial Fibrillation + Valvular Problems/Murmurs AS  Rhythm:Irregular Rate:Abnormal  ECHO: 1. Left ventricular ejection fraction, by estimation, is 60 to 65%. The  left ventricle has normal function. The left ventricle has no regional  wall motion abnormalities. There is mild left ventricular hypertrophy.  Left ventricular diastolic parameters  are indeterminate.   2. Right ventricular systolic function is normal. The right ventricular  size is normal.   3. The mitral valve is normal in structure. No evidence of mitral valve  regurgitation. No evidence of mitral stenosis.   4. The aortic valve is calcified. There is mild calcification of the  aortic valve. There is mild thickening of the aortic valve. Aortic valve  regurgitation is not visualized. Mild aortic valve stenosis. Aortic valve  area, by VTI measures 1.45 cm.  Aortic valve mean gradient measures 16.5 mmHg.   5. The inferior vena cava is normal in size with greater than 50%  respiratory variability, suggesting right atrial pressure of 3 mmHg.   Comparison(s): Echocardiogram done 01/29/21 showed an EF of 55-60% with  mild AS and an AV Mean Grad of 10 mmHg.     Neuro/Psych  Headaches  Anxiety        GI/Hepatic Neg liver  ROS,GERD  Medicated and Controlled,,  Endo/Other  diabetesHypothyroidism  Patient on GLP-1 Agonist  Renal/GU negative Renal ROSLab Results      Component                Value               Date                                   K                        4.3                 02/09/2024                       CREATININE               1.10                02/09/2024                     Musculoskeletal  (+) Arthritis ,    Abdominal  (+) + obese  Peds  Hematology  (+) Blood dyscrasia (Eliquis ) Lab Results      Component                Value               Date  WBC                      4.8                 02/09/2024                HGB                      12.6 (L)            02/09/2024                HCT                      37.1 (L)            02/09/2024                MCV                      95                  02/09/2024                PLT                      192                 02/09/2024              Anesthesia Other Findings   Reproductive/Obstetrics                              Anesthesia Physical Anesthesia Plan  ASA: 3  Anesthesia Plan: General   Post-op Pain Management: Minimal or no pain anticipated   Induction: Intravenous  PONV Risk Score and Plan: 2 and Propofol  infusion and Treatment may vary due to age or medical condition  Airway Management Planned: Natural Airway and Nasal Cannula  Additional Equipment: None  Intra-op Plan:   Post-operative Plan:   Informed Consent: I have reviewed the patients History and Physical, chart, labs and discussed the procedure including the risks, benefits and alternatives for the proposed anesthesia with the patient or authorized representative who has indicated his/her understanding and acceptance.     Dental advisory given  Plan Discussed with: CRNA  Anesthesia Plan Comments:          Anesthesia Quick Evaluation

## 2024-02-16 ENCOUNTER — Ambulatory Visit (HOSPITAL_COMMUNITY): Payer: Self-pay | Admitting: Anesthesiology

## 2024-02-16 ENCOUNTER — Encounter (HOSPITAL_COMMUNITY): Payer: Self-pay | Admitting: Cardiovascular Disease

## 2024-02-16 ENCOUNTER — Ambulatory Visit (HOSPITAL_COMMUNITY)
Admission: RE | Admit: 2024-02-16 | Discharge: 2024-02-16 | Disposition: A | Discharge status: Home / Self Care | Attending: Cardiovascular Disease | Admitting: Cardiovascular Disease

## 2024-02-16 ENCOUNTER — Encounter (HOSPITAL_COMMUNITY): Payer: Self-pay | Admitting: Anesthesiology

## 2024-02-16 ENCOUNTER — Other Ambulatory Visit: Payer: Self-pay

## 2024-02-16 ENCOUNTER — Encounter (HOSPITAL_COMMUNITY): Admission: RE | Disposition: A | Payer: Self-pay | Source: Home / Self Care | Attending: Cardiovascular Disease

## 2024-02-16 DIAGNOSIS — I4891 Unspecified atrial fibrillation: Secondary | ICD-10-CM

## 2024-02-16 DIAGNOSIS — E785 Hyperlipidemia, unspecified: Secondary | ICD-10-CM | POA: Diagnosis not present

## 2024-02-16 DIAGNOSIS — I4819 Other persistent atrial fibrillation: Secondary | ICD-10-CM | POA: Diagnosis not present

## 2024-02-16 DIAGNOSIS — I10 Essential (primary) hypertension: Secondary | ICD-10-CM

## 2024-02-16 DIAGNOSIS — Z79899 Other long term (current) drug therapy: Secondary | ICD-10-CM | POA: Insufficient documentation

## 2024-02-16 DIAGNOSIS — Z87891 Personal history of nicotine dependence: Secondary | ICD-10-CM | POA: Diagnosis not present

## 2024-02-16 DIAGNOSIS — F419 Anxiety disorder, unspecified: Secondary | ICD-10-CM

## 2024-02-16 DIAGNOSIS — Z006 Encounter for examination for normal comparison and control in clinical research program: Secondary | ICD-10-CM

## 2024-02-16 DIAGNOSIS — Z7901 Long term (current) use of anticoagulants: Secondary | ICD-10-CM | POA: Diagnosis not present

## 2024-02-16 DIAGNOSIS — G4733 Obstructive sleep apnea (adult) (pediatric): Secondary | ICD-10-CM | POA: Insufficient documentation

## 2024-02-16 DIAGNOSIS — I350 Nonrheumatic aortic (valve) stenosis: Secondary | ICD-10-CM | POA: Diagnosis not present

## 2024-02-16 HISTORY — PX: CARDIOVERSION: EP1203

## 2024-02-16 LAB — GLUCOSE, CAPILLARY: Glucose-Capillary: 123 mg/dL — ABNORMAL HIGH (ref 70–99)

## 2024-02-16 SURGERY — CARDIOVERSION (CATH LAB)
Anesthesia: General

## 2024-02-16 MED ORDER — PROPOFOL 10 MG/ML IV BOLUS
INTRAVENOUS | Status: DC | PRN
  Administered 2024-02-16: 70 mg via INTRAVENOUS

## 2024-02-16 MED ORDER — LIDOCAINE 2% (20 MG/ML) 5 ML SYRINGE
INTRAMUSCULAR | Status: DC | PRN
  Administered 2024-02-16: 60 mg via INTRAVENOUS

## 2024-02-16 SURGICAL SUPPLY — 1 items: PAD DEFIB RADIO PHYSIO CONN (PAD) ×2 IMPLANT

## 2024-02-16 NOTE — Discharge Instructions (Signed)

## 2024-02-16 NOTE — Anesthesia Postprocedure Evaluation (Signed)
 Anesthesia Post Note  Patient: Ronald King  Procedure(s) Performed: CARDIOVERSION     Patient location during evaluation: Cath Lab Anesthesia Type: General Level of consciousness: awake and alert Pain management: pain level controlled Vital Signs Assessment: post-procedure vital signs reviewed and stable Respiratory status: spontaneous breathing, nonlabored ventilation, respiratory function stable and patient connected to nasal cannula oxygen Cardiovascular status: blood pressure returned to baseline and stable Postop Assessment: no apparent nausea or vomiting Anesthetic complications: no   There were no known notable events for this encounter.  Last Vitals:  Vitals:   02/16/24 1112 02/16/24 1122  BP: 128/81 128/75  Pulse: 80 80  Resp: 14 20  Temp:    SpO2: 97% 97%    Last Pain:  Vitals:   02/16/24 1122  TempSrc:   PainSc: 0-No pain                 Garnette DELENA Gab

## 2024-02-16 NOTE — Op Note (Signed)
 Procedure: Electrical Cardioversion Indications:  Atrial Fibrillation  Procedure Details:  Consent: Risks of procedure as well as the alternatives and risks of each were explained to the (patient/caregiver).  Consent for procedure obtained.  Time Out: Verified patient identification, verified procedure, site/side was marked, verified correct patient position, special equipment/implants available, medications/allergies/relevent history reviewed, required imaging and test results available.  Performed  Patient placed on cardiac monitor, pulse oximetry, supplemental oxygen as necessary.  Sedation given: propofol  70 mg IV, Dr. Julieann Pacer pads placed anterior and posterior chest.  Cardioverted 1 time(s).  Cardioversion with synchronized biphasic 200J shock.  Evaluation: Findings: Post procedure EKG shows: NSR Complications: None Patient did tolerate procedure well.  Time Spent Directly with the Patient:  30 minutes   Ronald King 02/16/2024, 10:59 AM

## 2024-02-16 NOTE — Research (Signed)
 [facility] Cardioversion Informed Consent   Subject Name: Ronald King  Subject met inclusion and exclusion criteria.  The informed consent form, study requirements and expectations were reviewed with the subject and questions and concerns were addressed prior to the signing of the consent form.  The subject verbalized understanding of the trial requirements.  The subject agreed to participate in the [facility] Cardioversion trial and signed the informed consent at 0940 on 20/Nov/2025.  The informed consent was obtained prior to performance of any protocol-specific procedures for the subject.  A copy of the signed informed consent was given to the subject and a copy was placed in the subject's medical record.   [name]

## 2024-02-16 NOTE — Transfer of Care (Signed)
 Immediate Anesthesia Transfer of Care Note  Patient: Ronald King  Procedure(s) Performed: CARDIOVERSION  Patient Location: Cath Lab  Anesthesia Type:General  Level of Consciousness: awake, alert , and oriented  Airway & Oxygen Therapy: Patient Spontanous Breathing and Patient connected to nasal cannula oxygen  Post-op Assessment: Report given to RN and Post -op Vital signs reviewed and stable  Post vital signs: Reviewed and stable  Last Vitals:  Vitals Value Taken Time  BP 120/72 1101  Temp 97 1101  Pulse 75 1101  Resp 16 1101  SpO2 92 1101    Last Pain:  Vitals:   02/16/24 0935  TempSrc: Temporal         Complications: No notable events documented.

## 2024-02-16 NOTE — Progress Notes (Signed)
 Unfortunately, the patient went back into atrial fibrillation with controlled ventricular rate a few minutes before discharge home. Will discuss further management with Dr. Pietro. He seems inclined to follow a conservative path since he is not aware of any symptoms from the arrhythmia.

## 2024-02-16 NOTE — Interval H&P Note (Signed)
 History and Physical Interval Note:  02/16/2024 9:53 AM  Ronald King  has presented today for surgery, with the diagnosis of AFIB.  The various methods of treatment have been discussed with the patient and family. After consideration of risks, benefits and other options for treatment, the patient has consented to  Procedure(s): CARDIOVERSION (N/A) as a surgical intervention.  The patient's history has been reviewed, patient examined, no change in status, stable for surgery.  I have reviewed the patient's chart and labs.  Questions were answered to the patient's satisfaction.     [name]

## 2024-02-17 ENCOUNTER — Telehealth: Payer: Self-pay | Admitting: Cardiology

## 2024-02-17 NOTE — Telephone Encounter (Signed)
 Patient was suppose to have a procedure done today but couldn't. The dr told him to called to speak to Dr. Pietro to let him know what's going on. Please advise

## 2024-02-17 NOTE — Progress Notes (Signed)
  Radiation Oncology         [phone]) [phone] ________________________________  Name: Ronald King MRN: [id number]  Date of Service: 02/28/2024  DOB: 07/17/1945  Post Treatment Telephone Note  Diagnosis:  78 y.o. 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   First Treatment Date: 2023-11-29 Last Treatment Date: 2024-01-30   Plan Name: Prostate_Pelv Site: Prostate Technique: IMRT Mode: Photon Dose Per Fraction: 1.8 Gy Prescribed Dose (Delivered / Prescribed): 45 Gy / 45 Gy Prescribed Fxs (Delivered / Prescribed): 25 / 25   Plan Name: Prostate_Bst Site: Prostate Technique: IMRT Mode: Photon Dose Per Fraction: 2 Gy Prescribed Dose (Delivered / Prescribed): 30 Gy / 30 Gy Prescribed Fxs (Delivered / Prescribed): 15 / 15  Pre Treatment IPSS Score: 24 (as documented in the provider consult note)  The patient was available for call today.   Symptoms of fatigue have not improved since completing therapy. Patient is being treated for A-fib currently and says that makes him tired as well.  Symptoms of bladder changes have improved since completing therapy. Current symptoms include frequency and nocturia, and medications for bladder symptoms include Flomax and Gemtesa.  Symptoms of bowel changes have improved since completing therapy. Current symptoms include N/A, and medications for bowel symptoms include N/A.   Post Treatment IPSS Score: IPSS Questionnaire (AUA-7): Over the past month.   1)  How often have you had a sensation of not emptying your bladder completely after you finish urinating?  5 - Almost always  2)  How often have you had to urinate again less than two hours after you finished urinating? 5 - Almost always  3)  How often have you found you stopped and started again several times when you urinated?  2 - Less than half the time  4) How difficult have you found  it to postpone urination?  4 - More than half the time  5) How often have you had a weak urinary stream?  5 - Almost always  6) How often have you had to push or strain to begin urination?  3 - About half the time  7) How many times did you most typically get up to urinate from the time you went to bed until the time you got up in the morning?  2 - 2 times  Total score:  23. Which indicates severe symptoms  0-7 mildly symptomatic   8-19 moderately symptomatic   20-35 severely symptomatic   Patient has a scheduled follow up visit with his urologist, Dr. Donnice Brooks, in 3 weeks for ongoing surveillance. He was counseled that PSA levels will be drawn in the urology office, and was reassured that additional time is expected to improve bowel and bladder symptoms. He was encouraged to call back with concerns or questions regarding radiation.

## 2024-02-17 NOTE — Telephone Encounter (Signed)
 Spoke with pt who had cardioversion 02/17/24 which did not hold.  He went back into At Fib before being d/ced from the hospital after the procedure.  He would like to speak with Dr Pietro to determine what options he has at this point.  Aware I will forward this information to Dr Pietro for review and he will be contacted with further information.  Pt was appreciative of the call back and assistance.

## 2024-02-20 ENCOUNTER — Telehealth: Payer: Self-pay | Admitting: Cardiology

## 2024-02-20 DIAGNOSIS — I482 Chronic atrial fibrillation, unspecified: Secondary | ICD-10-CM

## 2024-02-20 NOTE — Telephone Encounter (Signed)
 Referral has been placed to A. Fib clinic. Pt agrees with plan of care.

## 2024-02-20 NOTE — Telephone Encounter (Signed)
 Patient wants a call back regarding next steps after he went back into Afib after his procedure on 11/20.

## 2024-02-21 NOTE — Telephone Encounter (Signed)
 Follow up scheduled

## 2024-02-21 NOTE — Progress Notes (Signed)
 RN left message for call back.

## 2024-02-21 NOTE — Telephone Encounter (Signed)
 Spoke with pt, aware of follow up appointment with a fib clinic.

## 2024-02-22 ENCOUNTER — Other Ambulatory Visit: Payer: Self-pay | Admitting: Internal Medicine

## 2024-02-27 ENCOUNTER — Ambulatory Visit (HOSPITAL_COMMUNITY)
Admission: RE | Admit: 2024-02-27 | Discharge: 2024-02-27 | Disposition: A | Discharge status: Home / Self Care | Source: Ambulatory Visit | Attending: Internal Medicine | Admitting: Internal Medicine

## 2024-02-27 ENCOUNTER — Encounter (HOSPITAL_COMMUNITY): Payer: Self-pay | Admitting: Internal Medicine

## 2024-02-27 VITALS — BP 128/60 | HR 88 | Ht 73.0 in | Wt 249.4 lb

## 2024-02-27 DIAGNOSIS — G4733 Obstructive sleep apnea (adult) (pediatric): Secondary | ICD-10-CM

## 2024-02-27 DIAGNOSIS — I480 Paroxysmal atrial fibrillation: Secondary | ICD-10-CM

## 2024-02-27 DIAGNOSIS — I350 Nonrheumatic aortic (valve) stenosis: Secondary | ICD-10-CM

## 2024-02-27 DIAGNOSIS — D6869 Other thrombophilia: Secondary | ICD-10-CM | POA: Diagnosis not present

## 2024-02-27 DIAGNOSIS — I4819 Other persistent atrial fibrillation: Secondary | ICD-10-CM | POA: Diagnosis not present

## 2024-02-27 DIAGNOSIS — I10 Essential (primary) hypertension: Secondary | ICD-10-CM

## 2024-02-27 MED ORDER — AMIODARONE HCL 200 MG PO TABS: ORAL_TABLET | ORAL | 3 refills | Status: AC

## 2024-02-27 NOTE — Progress Notes (Signed)
 Patient met with Dr. Nieves and had a PSA check.  PSA remains undetectable.  Patient will remain under urology care.  No additional needs at this time.

## 2024-02-27 NOTE — Progress Notes (Signed)
 "   Primary Care Physician: Amon Aloysius BRAVO, MD Primary Cardiologist: None Electrophysiologist: None  Referring Physician: Dr. Pietro Charlie JONETTA Ronald is a 78 y.o. male with a history of PAF(on Eliquis ),Aortic Stenosis ( mild on recent Echo),HTN, hypothyroidism, OSA (on CPAP), GERD, prostate CA with radiation seed implant, dyslipidemia, DM type II who presents for consultation in the [facility].  The patient was initially diagnosed on April 28 during an Echo and found to be in atrial fibrillation which was a new diagnosis.  He was found to be asymptomatic with controlled heart rate. He underwent a thyroidectomy and radiation seed implant that delayed his DCCV at that time. He underwent a DCCV procedure on 02/16/2024 with early return of atrial fibrillation. He presents today to discuss further treatment options for management of atrial fibrillation.  Patient presents today with his wife for follow-up and discussion of A-fib treatment with early return of arrhythmia.  He reports compliance with his Eliquis  and denies any missed doses.  During today's visit we discussed various treatment options for managing his atrial fibrillation consisting of Tikosyn, ablation, and amiodarone . Through a shared decision he elected to pursue amiodarone  as a possible bridge to ablation. He did contemplate pursuing an ablation however over the past year he has been overwhelmed with various procedures from managing his prostate cancer. He would like to pursue a less invasive alternative at this time and therefore we will have him initiate amiodarone  therapy.  We also discussed having him return in 3 weeks for a repeat EKG and possible DCCV at that time. He was in agreement with the above-mentioned plan and had all questions answered to his satisfaction.  He did also agree to discuss ablation in the future with our electrophysiology team if he is unable to maintain sinus rhythm with the  above-mentioned plan.  Today, he denies symptoms of palpitations, chest pain, shortness of breath, orthopnea, PND, lower extremity edema, dizziness, presyncope, syncope, snoring, daytime somnolence, bleeding, or neurologic sequela. The patient is tolerating medications without difficulties and is otherwise without complaint today.    Atrial Fibrillation Risk Factors:  he does have symptoms or diagnosis of sleep apnea. he does not have a history of rheumatic fever. he does not have a history of alcohol use. The patient doesnot have a history of early familial atrial fibrillation or other arrhythmias.  Atrial Fibrillation Management history:  Previous antiarrhythmic drugs: None Previous cardioversions: 02/16/2024 Previous ablations: None Anticoagulation history: Eliquis   ROS- All systems are reviewed and negative except as per the HPI above.  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   Heart murmur    Valve stenosis,  ECHO 01-2021   Hemorrhoids, internal    History of gastrointestinal hemorrhage    Hypertension    Hypogonadism, male    per Dr Faythe   Hypothyroidism    Nausea with vomiting 01/27/2024   Neuromuscular disorder (HCC)    neuropathy feet   Neuropathy    per neuro-Dr Lester vicodin  OSA (obstructive sleep apnea)    on CPAP   Sleep apnea    CPAP at night   Thyroid  nodule    Vitamin D  deficiency     Current Outpatient Medications  Medication Sig Dispense Refill   amiodarone  (PACERONE ) 200 MG tablet Take 1 tablet (200 mg total) by mouth 2 (two) times daily for 30 days, THEN 1 tablet (200 mg total) daily.  60 tablet 3   amLODipine  (NORVASC ) 10 MG tablet TAKE 1 TABLET(10 MG) BY MOUTH DAILY 90 tablet 1   apixaban  (ELIQUIS ) 5 MG TABS tablet Take 1 tablet (5 mg total) by mouth 2 (two) times daily.     ascorbic acid (VITAMIN C) 500 MG tablet Take 500 mg by mouth daily. Chewable     budesonide  (PULMICORT ) 0.5 MG/2ML nebulizer solution Take 2 mLs (0.5 mg total) by nebulization 2 (two) times daily as needed (shortness of breath, wheezing, cough). 75 mL 5   Calcium -Magnesium  (CAL-MAG PO) Take 1 capsule by mouth in the morning.     cholecalciferol (VITAMIN D3) 25 MCG (1000 UNIT) tablet Take 1,000 Units by mouth in the morning.     clonazePAM  (KLONOPIN ) 1 MG tablet TAKE 1 TABLET(1 MG) BY MOUTH TWICE DAILY AS NEEDED FOR ANXIETY 60 tablet 1   Coenzyme Q10-Vitamin E (QUNOL ULTRA COQ10 PO) Take 1 capsule by mouth in the morning.     cyanocobalamin  (VITAMIN B12) 1000 MCG tablet Take 1,000 mcg by mouth in the morning.     Dulaglutide (TRULICITY) 0.75 MG/0.5ML SOPN Inject 0.75 mg into the skin every Thursday.     ezetimibe  (ZETIA ) 10 MG tablet Take 10 mg by mouth daily.     fenofibrate  (TRICOR ) 145 MG tablet Take 1 tablet (145 mg total) by mouth daily. 90 tablet 1   GEMTESA 75 MG TABS Take 75 mg by mouth daily.     lansoprazole  (PREVACID ) 30 MG capsule TAKE 1 CAPSULE(30 MG) BY MOUTH TWICE DAILY BEFORE A MEAL (Patient taking differently: Take 30 mg by mouth 2 (two) times daily before a meal. may repeat dose (30 mg) in the afternoon/evening--if needed for acid reflux/indigestion.) 120 capsule 0   leuprolide , 6 Month, (ELIGARD ) 45 MG injection Inject 45 mg into the skin every 6 (six) months.     levothyroxine  (SYNTHROID ) 112 MCG tablet Take 1.5 tablets (168 mcg total) by mouth daily at 6 (six) AM. 45 tablet 3   Lysine 500 MG CAPS Take 500 mg by mouth in the morning.     Multiple Vitamin (MULTIVITAMIN WITH MINERALS) TABS tablet Take 1 tablet by mouth daily. Centrum Silver for Men 50+     NUBEQA 300 MG tablet Take 600 mg  by mouth 2 (two) times daily.     ondansetron  (ZOFRAN ) 4 MG tablet Take 1 tablet (4 mg total) by mouth every 8 (eight) hours as needed for nausea or vomiting. 20 tablet 3   oxymetazoline (AFRIN) 0.05 % nasal spray Place 1 spray into both nostrils 2 (two) times daily as needed for congestion.     PARoxetine  (PAXIL ) 20 MG tablet TAKE 1 TABLET(20 MG) BY MOUTH DAILY 90 tablet 1   pyridOXINE (VITAMIN B-6) 100 MG tablet Take 100 mg by mouth daily.     rosuvastatin  (CRESTOR ) 40 MG tablet Take 1 tablet (40 mg total) by mouth at bedtime. 90 tablet 1   tamsulosin (FLOMAX) 0.4 MG CAPS capsule Take 0.4 mg by mouth at bedtime.     No current facility-administered medications for this encounter.  Physical Exam: BP 128/60   Pulse 88   Ht 6' 1 (1.854 m)   Wt 113.1 kg   BMI 32.90 kg/m   GEN: Well nourished, well developed in no acute distress NECK: No JVD; No carotid bruits CARDIAC: Irregularly irregular rate and rhythm, no murmurs, rubs, gallops RESPIRATORY:  Clear to auscultation without rales, wheezing or rhonchi  ABDOMEN: Soft, non-tender, non-distended EXTREMITIES:  No edema; No deformity   Wt Readings from Last 3 Encounters:  02/27/24 113.1 kg  02/16/24 111.1 kg  02/08/24 112.4 kg     EKG today demonstrates:   EKG Interpretation Date/Time:  Monday February 27 2024 14:33:59 EST Ventricular Rate:  88 PR Interval:    QRS Duration:  86 QT Interval:  366 QTC Calculation: 442 R Axis:   42  Text Interpretation: Atrial fibrillation Abnormal ECG When compared with ECG of 09-Nov-2023 12:28, No significant change was found Confirmed by Terra Pac ([id number]) on 02/27/2024 2:54:16 PM        Echo 08/08/2023 demonstrated:  1. Left ventricular ejection fraction, by estimation, is 60 to 65%. The  left ventricle has normal function. The left ventricle has no regional  wall motion abnormalities. There is mild left ventricular hypertrophy.  Left ventricular diastolic parameters  are  indeterminate.   2. Right ventricular systolic function is normal. The right ventricular  size is normal.   3. The mitral valve is normal in structure. No evidence of mitral valve  regurgitation. No evidence of mitral stenosis.   4. The aortic valve is calcified. There is mild calcification of the  aortic valve. There is mild thickening of the aortic valve. Aortic valve  regurgitation is not visualized. Mild aortic valve stenosis. Aortic valve  area, by VTI measures 1.45 cm.  Aortic valve mean gradient measures 16.5 mmHg.   5. The inferior vena cava is normal in size with greater than 50%  respiratory variability, suggesting right atrial pressure of 3 mmHg.   Comparison(s): Echocardiogram done 01/29/21 showed an EF of 55-60% with  mild AS and an AV Mean Grad of 10 mmHg.    CHA2DS2-VASc Score = 5  The patient's score is based upon: CHF History: 0 HTN History: 1 Diabetes History: 1 Stroke History: 0 Vascular Disease History: 1 Age Score: 2 Gender Score: 0       ASSESSMENT AND PLAN: Paroxysmal Atrial Fibrillation (ICD10:  I48.0) The patient's CHA2DS2-VASc score is 5, indicating a 7.2% annual risk of stroke.   - Recent DCCV on 02/21/2024 with early return of AF and today he is in rate controlled AF. -We discussed Afib treatments today consisting of ablation, Tikosyn therapy and Amiodarone  initiation.  -Through shared decision with the patient and his wife we will start amiodarone  200 mg twice daily x 4 weeks and then 200 mg daily. -Patient's liver function is stable and is s/p thyroidectomy -Patient will return in 3 weeks for return visit and EKG check with plan to pursue DCCV if not converted at that time. -He is interested in pursuing Tikosyn or ablation in the future if unable to maintain sinus rhythm -Patient reminded to not miss any doses of anticoagulation over the next 3 weeks. -Continue Eliquis  5 mg BID  Essential hypertension: - Patient's blood pressure today was  initially soft at 96/58 and was 126/74 on recheck. - Continue current BP regimen as prescribed  History of aortic stenosis: - 2D echo completed on 07/2023 showing mild with plan to repeat in 1 year  History of OSA: -Continue  CPAP therapy   Informed Consent   Shared Decision Making/Informed Consent The risks (stroke, cardiac arrhythmias rarely resulting in the need for a temporary or permanent pacemaker, skin irritation or burns and complications associated with conscious sedation including aspiration, arrhythmia, respiratory failure and death), benefits (restoration of normal sinus rhythm) and alternatives of a direct current cardioversion were explained in detail to Ronald King and he agrees to proceed.     Signed,  Wyn Raddle, Ronald Shove, NP    02/27/2024 3:52 PM     Ronald Wyn, NP-C [facility] [address] [place], [place] [address] [phone] "

## 2024-02-27 NOTE — Patient Instructions (Signed)
 Start Amiodarone  200 mg twice a day for 30 days then decrease to 200 mg once a day  Do not miss any doses of Eliquis 

## 2024-02-28 ENCOUNTER — Ambulatory Visit
Admission: RE | Admit: 2024-02-28 | Discharge: 2024-02-28 | Disposition: A | Discharge status: Home / Self Care | Source: Ambulatory Visit | Attending: Radiation Oncology | Admitting: Radiation Oncology

## 2024-02-28 DIAGNOSIS — C61 Malignant neoplasm of prostate: Secondary | ICD-10-CM

## 2024-03-10 ENCOUNTER — Other Ambulatory Visit: Payer: Self-pay | Admitting: Internal Medicine

## 2024-03-15 ENCOUNTER — Telehealth: Payer: Self-pay | Admitting: Pharmacist

## 2024-03-15 NOTE — Progress Notes (Signed)
 Pharmacy Quality Measure Review  This patient is appearing on a report for being at risk of failing the adherence measure for diabetes medications this calendar year.   Medication: Trulicity 0.75mg  is on medication list and listed on Dr Micheline med list but insurance report has Trulicity 1.5mg   Last fill date: 10/26/2023 for 84 day supply - per [facility] was picked up 11/02/2023  Other fills for Trulicity 0.75 in 2025 was 05/05/2023 for 84 day supply and 08/08/2023 for 84 day supply.   Spoke with patient. He states he has plenty of Trulicity currently. Verified he is taking Trulicity 0.75mg  dose once weekly.   Madelin Ray, PharmD Clinical Pharmacist [facility]  Population Health [phone]

## 2024-03-16 ENCOUNTER — Other Ambulatory Visit: Payer: Self-pay | Admitting: Urology

## 2024-03-16 ENCOUNTER — Encounter (HOSPITAL_COMMUNITY): Payer: Self-pay | Admitting: Internal Medicine

## 2024-03-16 ENCOUNTER — Ambulatory Visit (HOSPITAL_COMMUNITY)
Admission: RE | Admit: 2024-03-16 | Discharge: 2024-03-16 | Disposition: A | Discharge status: Home / Self Care | Source: Ambulatory Visit | Attending: Internal Medicine | Admitting: Internal Medicine

## 2024-03-16 DIAGNOSIS — I4819 Other persistent atrial fibrillation: Secondary | ICD-10-CM | POA: Diagnosis not present

## 2024-03-16 DIAGNOSIS — C61 Malignant neoplasm of prostate: Secondary | ICD-10-CM

## 2024-03-16 DIAGNOSIS — Z5181 Encounter for therapeutic drug level monitoring: Secondary | ICD-10-CM

## 2024-03-16 DIAGNOSIS — D6869 Other thrombophilia: Secondary | ICD-10-CM

## 2024-03-16 NOTE — H&P (View-Only) (Signed)
" ° °  Ronald King presents today for 1 week follow-up of amiodarone  initiation.  EKG was completed and patient is still in atrial fibrillation with rate of 80 bpm.  He has been compliant with his medication and denies any missed doses.  He is not suffering any adverse reactions with amiodarone . He has also been compliant with his Eliquis  and denies any missed doses.  Per previous office visit we will proceed with DCCV to help restore sinus rhythm.   Shared Decision Making/Informed Consent The risks (stroke, cardiac arrhythmias rarely resulting in the need for a temporary or permanent pacemaker, skin irritation or burns and complications associated with conscious sedation including aspiration, arrhythmia, respiratory failure and death), benefits (restoration of normal sinus rhythm) and alternatives of a direct current cardioversion were explained in detail to Ronald King and he agrees to proceed.      Follow up 2 weeks after DCCV. "

## 2024-03-16 NOTE — Patient Instructions (Addendum)
 12/31 - decrease amiodarone  to 200mg  once a day     Hold below medications 7 days prior to scheduled procedure/anesthesia.  Restart medication on the normal dosing day after scheduled procedure/anesthesia  Dulaglutide (Trulicity)       Cardioversion scheduled for: Tuesday December 23rd   - Arrive at the [facility] ([address]  and check in with ADMITTING at 9:00 AM   - Do not eat or drink anything after midnight the night prior to your procedure.   - Take all your morning medication (except diabetic medications) with a sip of water  prior to arrival.  - Do NOT miss any doses of your blood thinner - if you should miss a dose or take a dose more than 4 hours late -- please notify our office immediately.  - You will not be able to drive home after your procedure. Please ensure you have a responsible adult to drive you home. You will need someone with you for 24 hours post procedure.     - Expect to be in the procedural area approximately 2 hours.   - If you feel as if you go back into normal rhythm prior to scheduled cardioversion, please notify our office immediately.   If your procedure is canceled in the cardioversion suite you will be charged a cancellation fee.

## 2024-03-16 NOTE — Progress Notes (Signed)
" ° °  Ronald King presents today for 1 week follow-up of amiodarone  initiation.  EKG was completed and patient is still in atrial fibrillation with rate of 80 bpm.  He has been compliant with his medication and denies any missed doses.  He is not suffering any adverse reactions with amiodarone . He has also been compliant with his Eliquis  and denies any missed doses.  Per previous office visit we will proceed with DCCV to help restore sinus rhythm.   Shared Decision Making/Informed Consent The risks (stroke, cardiac arrhythmias rarely resulting in the need for a temporary or permanent pacemaker, skin irritation or burns and complications associated with conscious sedation including aspiration, arrhythmia, respiratory failure and death), benefits (restoration of normal sinus rhythm) and alternatives of a direct current cardioversion were explained in detail to Ronald King and he agrees to proceed.      Follow up 2 weeks after DCCV. "

## 2024-03-16 NOTE — Addendum Note (Signed)
 Encounter addended by: Wyn Jackee VEAR Mickey., NP on: 03/16/2024 2:22 PM  Actions taken: Clinical Note Signed

## 2024-03-17 LAB — BASIC METABOLIC PANEL WITH GFR
BUN/Creatinine Ratio: 15 (ref 10–24)
BUN: 21 mg/dL (ref 8–27)
CO2: 24 mmol/L (ref 20–29)
Calcium: 9.3 mg/dL (ref 8.6–10.2)
Chloride: 105 mmol/L (ref 96–106)
Creatinine, Ser: 1.42 mg/dL — ABNORMAL HIGH (ref 0.76–1.27)
Glucose: 89 mg/dL (ref 70–99)
Potassium: 4.2 mmol/L (ref 3.5–5.2)
Sodium: 140 mmol/L (ref 134–144)
eGFR: 51 mL/min/1.73 — ABNORMAL LOW

## 2024-03-17 LAB — CBC
Hematocrit: 35.7 % — ABNORMAL LOW (ref 37.5–51.0)
Hemoglobin: 11.6 g/dL — ABNORMAL LOW (ref 13.0–17.7)
MCH: 31.7 pg (ref 26.6–33.0)
MCHC: 32.5 g/dL (ref 31.5–35.7)
MCV: 98 fL — ABNORMAL HIGH (ref 79–97)
Platelets: 182 x10E3/uL (ref 150–450)
RBC: 3.66 x10E6/uL — ABNORMAL LOW (ref 4.14–5.80)
RDW: 13.1 % (ref 11.6–15.4)
WBC: 5 x10E3/uL (ref 3.4–10.8)

## 2024-03-19 NOTE — Pre-Procedure Instructions (Signed)
 Patient called cath lab regarding procedure instructions.  Informed patient of the instructions for cardioversion tomorrow.  Arrive @ [id number].  Don't eat or drink anything after midnight.  Ok to take Amio, Norvasc , Eliquis , Synthroid  morning of procedure with sips of water .  You will need a responsible person to drive you home.  He states he has not missed any doses in last 4 weeks.

## 2024-03-20 ENCOUNTER — Ambulatory Visit (HOSPITAL_COMMUNITY): Admission: RE | Admit: 2024-03-20 | Discharge: 2024-03-20 | Disposition: A | Discharge status: Home / Self Care

## 2024-03-20 ENCOUNTER — Other Ambulatory Visit: Payer: Self-pay

## 2024-03-20 ENCOUNTER — Ambulatory Visit (HOSPITAL_COMMUNITY)

## 2024-03-20 ENCOUNTER — Encounter (HOSPITAL_COMMUNITY): Admission: RE | Disposition: A | Payer: Self-pay

## 2024-03-20 DIAGNOSIS — Z7901 Long term (current) use of anticoagulants: Secondary | ICD-10-CM | POA: Diagnosis not present

## 2024-03-20 DIAGNOSIS — C61 Malignant neoplasm of prostate: Secondary | ICD-10-CM | POA: Diagnosis not present

## 2024-03-20 DIAGNOSIS — J449 Chronic obstructive pulmonary disease, unspecified: Secondary | ICD-10-CM

## 2024-03-20 DIAGNOSIS — E119 Type 2 diabetes mellitus without complications: Secondary | ICD-10-CM | POA: Diagnosis not present

## 2024-03-20 DIAGNOSIS — I350 Nonrheumatic aortic (valve) stenosis: Secondary | ICD-10-CM | POA: Insufficient documentation

## 2024-03-20 DIAGNOSIS — Z7985 Long-term (current) use of injectable non-insulin antidiabetic drugs: Secondary | ICD-10-CM | POA: Insufficient documentation

## 2024-03-20 DIAGNOSIS — I10 Essential (primary) hypertension: Secondary | ICD-10-CM | POA: Diagnosis not present

## 2024-03-20 DIAGNOSIS — G4733 Obstructive sleep apnea (adult) (pediatric): Secondary | ICD-10-CM | POA: Diagnosis not present

## 2024-03-20 DIAGNOSIS — I480 Paroxysmal atrial fibrillation: Secondary | ICD-10-CM | POA: Diagnosis not present

## 2024-03-20 DIAGNOSIS — I4891 Unspecified atrial fibrillation: Secondary | ICD-10-CM | POA: Diagnosis not present

## 2024-03-20 DIAGNOSIS — Z87891 Personal history of nicotine dependence: Secondary | ICD-10-CM

## 2024-03-20 DIAGNOSIS — I4819 Other persistent atrial fibrillation: Secondary | ICD-10-CM

## 2024-03-20 DIAGNOSIS — Z79899 Other long term (current) drug therapy: Secondary | ICD-10-CM | POA: Insufficient documentation

## 2024-03-20 HISTORY — PX: CARDIOVERSION: EP1203

## 2024-03-20 SURGERY — CARDIOVERSION (CATH LAB)
Anesthesia: General

## 2024-03-20 MED ORDER — VITAMIN B-6 100 MG PO TABS: 100.0000 mg | ORAL_TABLET | Freq: Every day | ORAL | Status: DC

## 2024-03-20 MED ORDER — COENZYME Q10-VITAMIN E 100-150 MG-UNIT PO CAPS: 100.0000 mg | ORAL_CAPSULE | Freq: Every morning | ORAL | Status: DC

## 2024-03-20 MED ORDER — DAROLUTAMIDE 300 MG PO TABS: 600.0000 mg | ORAL_TABLET | Freq: Two times a day (BID) | ORAL | Status: DC

## 2024-03-20 MED ORDER — LYSINE 500 MG PO CAPS: 500.0000 mg | ORAL_CAPSULE | Freq: Every morning | ORAL | Status: DC

## 2024-03-20 MED ORDER — ROSUVASTATIN CALCIUM 20 MG PO TABS: 40.0000 mg | ORAL_TABLET | Freq: Every day | ORAL | Status: DC

## 2024-03-20 MED ORDER — OXYMETAZOLINE HCL 0.05 % NA SOLN: 1.0000 | Freq: Two times a day (BID) | NASAL | Status: DC | PRN

## 2024-03-20 MED ORDER — ADULT MULTIVITAMIN W/MINERALS CH: 1.0000 | ORAL_TABLET | Freq: Every day | ORAL | Status: DC

## 2024-03-20 MED ORDER — BUDESONIDE 0.5 MG/2ML IN SUSP: 0.5000 mg | Freq: Two times a day (BID) | RESPIRATORY_TRACT | Status: DC | PRN

## 2024-03-20 MED ORDER — ONDANSETRON HCL 4 MG PO TABS: 4.0000 mg | ORAL_TABLET | Freq: Three times a day (TID) | ORAL | Status: DC | PRN

## 2024-03-20 MED ORDER — APIXABAN 5 MG PO TABS: 5.0000 mg | ORAL_TABLET | Freq: Two times a day (BID) | ORAL | Status: DC

## 2024-03-20 MED ORDER — LIDOCAINE 2% (20 MG/ML) 5 ML SYRINGE
INTRAMUSCULAR | Status: DC | PRN
  Administered 2024-03-20: 100 mg via INTRAVENOUS

## 2024-03-20 MED ORDER — SODIUM CHLORIDE 0.9 % IV SOLN: INTRAVENOUS | Status: DC

## 2024-03-20 MED ORDER — VITAMIN B-12 1000 MCG PO TABS: 1000.0000 ug | ORAL_TABLET | Freq: Every morning | ORAL | Status: DC

## 2024-03-20 MED ORDER — LEUPROLIDE ACETATE 7.5 MG ~~LOC~~ KIT: 7.5000 mg | PACK | SUBCUTANEOUS | Status: DC

## 2024-03-20 MED ORDER — AZITHROMYCIN 250 MG PO TABS: 250.0000 mg | ORAL_TABLET | Freq: Every day | ORAL | Status: DC | PRN

## 2024-03-20 MED ORDER — TAMSULOSIN HCL 0.4 MG PO CAPS: 0.4000 mg | ORAL_CAPSULE | Freq: Every day | ORAL | Status: DC

## 2024-03-20 MED ORDER — AMOXICILLIN 500 MG PO TABS: 500.0000 mg | ORAL_TABLET | Freq: Every day | ORAL | Status: DC | PRN

## 2024-03-20 MED ORDER — PAROXETINE HCL 20 MG PO TABS: 20.0000 mg | ORAL_TABLET | Freq: Every day | ORAL | Status: DC

## 2024-03-20 MED ORDER — PROPOFOL 10 MG/ML IV BOLUS
INTRAVENOUS | Status: DC | PRN
  Administered 2024-03-20: 60 mg via INTRAVENOUS
  Administered 2024-03-20: 30 mg via INTRAVENOUS

## 2024-03-20 MED ORDER — VITAMIN C 500 MG PO TABS: 500.0000 mg | ORAL_TABLET | Freq: Every day | ORAL | Status: DC

## 2024-03-20 MED ORDER — AMLODIPINE BESYLATE 5 MG PO TABS: 10.0000 mg | ORAL_TABLET | Freq: Every day | ORAL | Status: DC

## 2024-03-20 MED ORDER — CLONAZEPAM 1 MG PO TABS: 1.0000 mg | ORAL_TABLET | Freq: Two times a day (BID) | ORAL | Status: DC

## 2024-03-20 MED ORDER — DULAGLUTIDE 0.75 MG/0.5ML ~~LOC~~ SOAJ: 0.7500 mg | SUBCUTANEOUS | Status: DC

## 2024-03-20 MED ORDER — CALCIUM-MAGNESIUM 500-250 MG PO TABS: ORAL_TABLET | Freq: Every morning | ORAL | Status: DC

## 2024-03-20 MED ORDER — AMIODARONE HCL 200 MG PO TABS: 200.0000 mg | ORAL_TABLET | Freq: Two times a day (BID) | ORAL | Status: DC

## 2024-03-20 MED ORDER — VITAMIN D 25 MCG (1000 UNIT) PO TABS: 1000.0000 [IU] | ORAL_TABLET | Freq: Every morning | ORAL | Status: DC

## 2024-03-20 MED ORDER — MIRABEGRON ER 25 MG PO TB24: 25.0000 mg | ORAL_TABLET | Freq: Every day | ORAL | Status: DC

## 2024-03-20 MED ORDER — PANTOPRAZOLE SODIUM 20 MG PO TBEC: 20.0000 mg | DELAYED_RELEASE_TABLET | Freq: Every day | ORAL | Status: DC

## 2024-03-20 MED ORDER — HYDROCODONE BIT-HOMATROP MBR 5-1.5 MG/5ML PO SOLN: 5.0000 mL | Freq: Four times a day (QID) | ORAL | Status: DC | PRN

## 2024-03-20 SURGICAL SUPPLY — 1 items: PAD DEFIB RADIO PHYSIO CONN (PAD) ×1 IMPLANT

## 2024-03-20 NOTE — Discharge Instructions (Signed)
 Electrical Cardioversion Electrical cardioversion is the delivery of a jolt of electricity to restore a normal rhythm to the heart. A rhythm that is too fast or is not regular keeps the heart from pumping well. In this procedure, sticky patches or metal paddles are placed on the chest to deliver electricity to the heart from a device. This procedure may be done in an emergency if: There is low or no blood pressure as a result of the heart rhythm. Normal rhythm must be restored as fast as possible to protect the brain and heart from further damage. It may save a life. This may also be a scheduled procedure for irregular or fast heart rhythms that are not immediately life-threatening.  What can I expect after the procedure? Your blood pressure, heart rate, breathing rate, and blood oxygen level will be monitored until you leave the hospital or clinic. Your heart rhythm will be watched to make sure it does not change. You may have some redness on the skin where the shocks were given. Over the counter cortizone cream may be helpful.  Follow these instructions at home: Do not drive for 24 hours if you were given a sedative during your procedure. Take over-the-counter and prescription medicines only as told by your health care provider. Ask your health care provider how to check your pulse. Check it often. Rest for 48 hours after the procedure or as told by your health care provider. Avoid or limit your caffeine use as told by your health care provider. Keep all follow-up visits as told by your health care provider. This is important. Contact a health care provider if: You feel like your heart is beating too quickly or your pulse is not regular. You have a serious muscle cramp that does not go away. Get help right away if: You have discomfort in your chest. You are dizzy or you feel faint. You have trouble breathing or you are short of breath. Your speech is slurred. You have trouble moving an  arm or leg on one side of your body. Your fingers or toes turn cold or blue. Summary Electrical cardioversion is the delivery of a jolt of electricity to restore a normal rhythm to the heart. This procedure may be done right away in an emergency or may be a scheduled procedure if the condition is not an emergency. Generally, this is a safe procedure. After the procedure, check your pulse often as told by your health care provider. This information is not intended to replace advice given to you by your health care provider. Make sure you discuss any questions you have with your health care provider. Document Revised: 10/16/2018 Document Reviewed: 10/16/2018 Elsevier Patient EducatiElectrical Cardioversion Electrical cardioversion is the delivery of a jolt of electricity to restore a normal rhythm to the heart. A rhythm that is too fast or is not regular (arrhythmia) keeps the heart from pumping blood well. There is also another type of cardioversion called a chemical (pharmacologic) cardioversion. This is when your health care provider gives you one or more medicines to bring back your regular heart rhythm. Electrical cardioversion is done as a scheduled procedure for arrhythmiasthat are not life-threatening. Electrical cardioversion may also be done in an emergency for sudden life-threatening arrhythmias. Tell a health care provider about: Any allergies you have. All medicines you are taking, including vitamins, herbs, eye drops, creams, and over-the-counter medicines. Any problems you or family members have had with sedatives or anesthesia. Any bleeding problems you have. Any surgeries you  have had, including a pacemaker, defibrillator, or other implanted device. Any medical conditions you have. Whether you are pregnant or may be pregnant. What are the risks? Your provider will talk with you about risks. These include: Allergic reactions to medicines. Irritation to the skin on your chest or  back where the sticky pads (electrodes) or paddles were put during electrical cardioversion. A blood clot that breaks free and travels to other parts of your body, such as your brain. Return of a worse abnormal heart rhythm that will need to be treated with medicines, a pacemaker, or an implantable cardioverter defibrillator (ICD). What happens before the procedure? Medicines Your provider may give you: Blood-thinning medicines (anticoagulants) so your blood does not clot as easily. If your provider gives you this medicine, you may need to take it for 4 weeks before the procedure. Medicines to help stabilize your heart rate and rhythm. Ask your provider about: Changing or stopping your regular medicines. These include any diabetes medicines or blood thinners you take. Taking medicines such as aspirin and ibuprofen. These medicines can thin your blood. Do not take them unless your provider tells you to. Taking over-the-counter medicines, vitamins, herbs, and supplements. General instructions Follow instructions from your provider about what you may eat and drink. Do not put any lotions, powders, or ointments on your chest and back for 24 hours before the procedure. They can cause problems with the electrodes or paddles used to deliver electricity to your heart. Do not wear jewelry as this can interfere with delivering electricity to your heart. If you will be going home right after the procedure, plan to have a responsible adult: Take you home from the hospital or clinic. You will not be allowed to drive. Care for you for the time you are told. Tests You may have an exam or testing. This may include: Blood labs. A transesophageal echocardiogram (TEE). What happens during the procedure?     An IV will be inserted into one of your veins. You will be given a sedative. This helps you relax. Electrodes or metal paddles will be placed on your chest. They may be placed in one of these ways: One  placed on your right chest, the other on the left ribs. One placed on your chest and the other on your back. An electrical shock will be delivered. The shock briefly stops (resets) your heart rhythm. Your provider will check to see if your heart rhythm is now normal. Some people need only one shock. Some need more to restore a normal heart rhythm. The procedure may vary among providers and hospitals. What happens after the procedure? Your blood pressure, heart rate, breathing rate, and blood oxygen level will be monitored until you leave the hospital or clinic. Your heart rhythm will be watched to make sure it does not change. This information is not intended to replace advice given to you by your health care provider. Make sure you discuss any questions you have with your health care provider. Document Revised: 11/05/2021 Document Reviewed: 11/05/2021 Elsevier Patient Education  2024 [employer]on  2020 [employer].

## 2024-03-20 NOTE — Interval H&P Note (Signed)
 History and Physical Interval Note:  03/20/2024 8:40 AM  Ronald King  has presented today for surgery, with the diagnosis of afib.  The various methods of treatment have been discussed with the patient and family. After consideration of risks, benefits and other options for treatment, the patient has consented to  Procedures: CARDIOVERSION (N/A) as a surgical intervention.  The patient's history has been reviewed, patient examined, no change in status, stable for surgery.  I have reviewed the patient's chart and labs.  Questions were answered to the patient's satisfaction.     [name]

## 2024-03-20 NOTE — Transfer of Care (Signed)
 Immediate Anesthesia Transfer of Care Note  Patient: Ronald King  Procedure(s) Performed: CARDIOVERSION  Patient Location: PACU and Cath Lab  Anesthesia Type:MAC  Level of Consciousness: drowsy  Airway & Oxygen Therapy: Patient Spontanous Breathing  Post-op Assessment: Report given to RN  Post vital signs: stable  Last Vitals:  Vitals Value Taken Time  BP    Temp    Pulse    Resp    SpO2      Last Pain:  Vitals:   03/20/24 0902  TempSrc:   PainSc: 0-No pain         Complications: No notable events documented.

## 2024-03-20 NOTE — Anesthesia Preprocedure Evaluation (Addendum)
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 Left ventricular ejection fraction, by estimation, is 60 to 65%. The  left ventricle has normal function. The left ventricle has no regional  wall motion abnormalities. There is mild left ventricular hypertrophy.  Left ventricular diastolic parameters are indeterminate.   2. Right ventricular systolic function is normal. The right ventricular  size is normal.   3. The mitral valve is normal in structure. No evidence of mitral valve  regurgitation. No evidence of mitral stenosis.   4. The aortic valve is calcified. There is mild calcification of the  aortic valve. There is mild thickening of the aortic valve. Aortic valve  regurgitation is not visualized. Mild aortic valve stenosis. Aortic valve  area, by VTI measures 1.45 cm. Aortic valve mean gradient measures 16.5 mmHg.   5. The inferior vena cava is normal in size with greater than 50% respiratory variability, suggesting right atrial pressure of 3 mmHg.      Neuro/Psych    GI/Hepatic   Endo/Other  diabetes, Type 2Hypothyroidism  S/p Thyroidectomy (09/2023)  Renal/GU Renal InsufficiencyRenal disease   Hx of Prostate Cancer    Musculoskeletal   Abdominal   Peds  Hematology Hgb 11.6, Plts 182K (03/16/24)   Anesthesia  Other Findings   Reproductive/Obstetrics                              Anesthesia Physical Anesthesia Plan  ASA: 3  Anesthesia Plan: General   Post-op Pain Management:    Induction: Intravenous  PONV Risk Score and Plan: 2 and Treatment may vary due to age or medical condition  Airway Management Planned: Natural Airway and Simple Face Mask  Additional Equipment: None  Intra-op Plan:   Post-operative Plan: Extubation in OR  Informed Consent: I have reviewed the patients History and Physical, chart, labs and discussed the procedure including the risks, benefits and alternatives for the proposed anesthesia with the patient or authorized representative who has indicated his/her understanding and acceptance.     Dental advisory given  Plan Discussed with: CRNA  Anesthesia Plan Comments:          Anesthesia Quick Evaluation  "

## 2024-03-20 NOTE — CV Procedure (Signed)
" ° °  DIRECT CURRENT CARDIOVERSION  NAME:  Ronald King    MRN: [id number] DOB:  Sep 16, 1945    ADMIT DATE: 03/20/2024  Indication:  Symptomatic atrial fibrillation  Procedure Note:  The patient signed informed consent.  They have had had therapeutic anticoagulation with greater than 3 weeks.  Anesthesia was administered by Dr. Waddell.  Adequate airway was maintained throughout and vital followed per protocol.  They were cardioverted x 1 with 200J of biphasic synchronized energy, which was unsuccessful and trialed 300J x 1 which was also unsuccessful. Patient remained in atrial fibrillation.  There were no apparent complications.  The patient had normal neuro status and respiratory status post procedure with vitals stable as recorded elsewhere.    Follow up: They will continue on current medical therapy and follow up with cardiology as scheduled.  Joelle DEL. Ren Ny, MD, [facility]  [facility]  9:15 AM  "

## 2024-03-21 ENCOUNTER — Encounter (HOSPITAL_COMMUNITY): Payer: Self-pay

## 2024-03-22 ENCOUNTER — Other Ambulatory Visit: Payer: Self-pay | Admitting: Internal Medicine

## 2024-03-26 ENCOUNTER — Ambulatory Visit (HOSPITAL_COMMUNITY): Payer: Self-pay | Admitting: Nurse Practitioner

## 2024-03-26 NOTE — Anesthesia Postprocedure Evaluation (Signed)
"   Anesthesia Post Note  Patient: Ronald King  Procedure(s) Performed: CARDIOVERSION     Patient location during evaluation: Cath Lab Anesthesia Type: General Level of consciousness: awake Pain management: pain level controlled Vital Signs Assessment: post-procedure vital signs reviewed and stable Respiratory status: spontaneous breathing Cardiovascular status: blood pressure returned to baseline Postop Assessment: no apparent nausea or vomiting Anesthetic complications: no   No notable events documented.  Last Vitals:  Vitals:   03/20/24 0930 03/20/24 0935  BP: 130/74 126/83  Pulse: 71 66  Resp: 18 19  Temp:    SpO2: 95% 96%    Last Pain:  Vitals:   03/20/24 0902  TempSrc:   PainSc: 0-No pain                 Tareek Sabo T Colhoun      "

## 2024-03-27 ENCOUNTER — Encounter: Payer: Self-pay | Admitting: Internal Medicine

## 2024-03-27 ENCOUNTER — Ambulatory Visit: Admitting: Internal Medicine

## 2024-03-27 VITALS — BP 116/68 | HR 70 | Temp 97.9°F | Resp 18 | Ht 73.0 in | Wt 249.2 lb

## 2024-03-27 DIAGNOSIS — Z23 Encounter for immunization: Secondary | ICD-10-CM | POA: Diagnosis not present

## 2024-03-27 DIAGNOSIS — E785 Hyperlipidemia, unspecified: Secondary | ICD-10-CM | POA: Diagnosis not present

## 2024-03-27 DIAGNOSIS — I4819 Other persistent atrial fibrillation: Secondary | ICD-10-CM

## 2024-03-27 DIAGNOSIS — I482 Chronic atrial fibrillation, unspecified: Secondary | ICD-10-CM

## 2024-03-27 DIAGNOSIS — I10 Essential (primary) hypertension: Secondary | ICD-10-CM | POA: Diagnosis not present

## 2024-03-27 NOTE — Patient Instructions (Signed)
" ° °  go to the front desk for the checkout Please make an appointment     You are a flu shot today Please consider RSV shot. Also pneumonia shot (PNM 20).   Check the  blood pressure regularly Blood pressure goal:  between 110/65 and  135/85. If it is consistently higher or lower, let me know     ATRIAL FIBRILLATION:  Continue management with your cardiologist.  ANEMIA SECONDARY TO CANCER THERAPY: You have mild anemia likely due to your recent radiation and cancer treatments. -We will continue to monitor your hemoglobin and hematocrit levels. No immediate blood work is required.  HYPERTENSION: Your blood pressure is being managed with amlodipine . -Continue taking your current antihypertensive medication.  DYSLIPIDEMIA: Your cholesterol levels are being managed with rosuvastatin  and fenofibrate . -Continue taking your current lipid-lowering medications.  HISTORY OF THYROIDECTOMY:   -Continue taking your thyroid  medication as prescribed.   "

## 2024-03-27 NOTE — Progress Notes (Unsigned)
 "  Subjective:    Patient ID: Ronald King, male    DOB: Jan 28, 1946, 78 y.o.   MRN: [id number]  DOS:  03/27/2024 Follow-up Discussed the use of AI scribe software for clinical note transcription with the patient, who gave verbal consent to proceed.  History of Present Illness Ronald King is a 78 year old male with cancer and atrial fibrillation who presents for a routine checkup.  Prostate cancer treatment and related symptoms - Completed 40 daily radiation treatments approximately 6 weeks ago - Receives Eligard  injections every 6 months and takes daily Nubeqa  - Significant fatigue and increased sleep since cancer treatment  Atrial fibrillation and cardiac symptoms - Persistent atrial fibrillation despite nearly 30 days of amiodarone  therapy and two cardioversions, most recent a few days ago, without restoration of normal rhythm - No palpitations - Mild exertional shortness of breath, particularly when singing  Hematologic abnormalities and gastrointestinal bleeding - Recent laboratory studies show low RBC, hemoglobin, and hematocrit with elevated MCV - Occasional bleeding with bowel movements, associated with hard stools  Thyroid  function and replacement therapy - History of thyroidectomy - Takes thyroid  medication daily at 6 AM - Recent thyroid  function tests within satisfactory range     Review of Systems See above   Past Medical History:  Diagnosis Date   Allergy     Anemia    Anxiety    Aortic stenosis    Blood transfusion without reported diagnosis    Cancer (HCC)    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   Heart murmur    Valve stenosis,  ECHO 01-2021   Hemorrhoids, internal    History of gastrointestinal hemorrhage    Hypertension     Hypogonadism, male    per Dr Faythe   Hypothyroidism    Nausea with vomiting 01/27/2024   Neuromuscular disorder (HCC)    neuropathy feet   Neuropathy    per neuro-Dr Lester vicodin   OSA (obstructive sleep apnea)    on CPAP   Sleep apnea    CPAP at night   Thyroid  nodule    Vitamin D  deficiency     Past Surgical History:  Procedure Laterality Date   ANAL FISSURE REPAIR     BIOPSY THYROID      CARDIOVERSION N/A 02/16/2024   Procedure: CARDIOVERSION;  Surgeon: Francyne Headland, MD;  Location: [facility] INVASIVE CV LAB;  Service: Cardiovascular;  Laterality: N/A;   CARDIOVERSION N/A 03/20/2024   Procedure: CARDIOVERSION;  Surgeon: Ren Donley Joelle VEAR, MD;  Location: [facility] INVASIVE CV LAB;  Service: Cardiovascular;  Laterality: N/A;   GOLD SEED IMPLANT N/A 11/11/2023   Procedure: INSERTION, GOLD SEEDS;  Surgeon: Selma Donnice SAUNDERS, MD;  Location: [facility] OR;  Service: Urology;  Laterality: N/A;   hand fracture surgery     left   KNEE ARTHROSCOPY Left 2007   Dr. Addie   LAPAROSCOPIC NISSEN FUNDOPLICATION  05/1999   Dr. Gladis   NOSE SURGERY     PROSTATE BIOPSY     SPACE OAR INSTILLATION N/A 11/11/2023   Procedure: INJECTION, HYDROGEL SPACER;  Surgeon: Selma Donnice SAUNDERS, MD;  Location: [facility] OR;  Service: Urology;  Laterality: N/A;   THYROIDECTOMY N/A 10/14/2023   Procedure: THYROIDECTOMY;  Surgeon: Polly Cordella LABOR, MD;  Location: [facility] ORS;  Service: General;  Laterality: N/A;  TOTAL THYROIDECTOMY   TONSILLECTOMY     UPPER GASTROINTESTINAL ENDOSCOPY      Current Outpatient Medications  Medication Instructions   amiodarone  (PACERONE ) 200 MG tablet Take 1 tablet (200 mg total) by mouth 2 (two) times daily for 30 days, THEN 1 tablet (200 mg total) daily.   amLODipine  (NORVASC ) 10 MG tablet TAKE 1 TABLET(10 MG) BY MOUTH DAILY   amoxicillin  (AMOXIL ) 500 mg, Daily PRN   apixaban  (ELIQUIS ) 5 mg, Oral, 2 times daily   ascorbic acid (VITAMIN C ) 500 mg, Daily   azithromycin  (ZITHROMAX ) 250 mg, Daily PRN    budesonide  (PULMICORT ) 0.5 mg, Nebulization, 2 times daily PRN   Calcium -Magnesium  (CAL-MAG PO) 1 capsule, Every morning   cholecalciferol (VITAMIN D3) 1,000 Units, Every morning   clonazePAM  (KLONOPIN ) 1 MG tablet TAKE 1 TABLET(1 MG) BY MOUTH TWICE DAILY AS NEEDED FOR ANXIETY   Coenzyme Q10-Vitamin E  100-150 MG-UNIT CAPS 100 mg, Every morning   cyanocobalamin  (VITAMIN B12) 1,000 mcg, Every morning   Eligard  7.5 mg, Every 6 months   fenofibrate  (TRICOR ) 145 MG tablet TAKE 1 TABLET(145 MG) BY MOUTH DAILY   Gemtesa 75 mg, Daily   HYDROcodone  bit-homatropine (HYCODAN) 5-1.5 MG/5ML syrup 5 mLs, Every 6 hours PRN   lansoprazole  (PREVACID ) 30 MG capsule TAKE 1 CAPSULE(30 MG) BY MOUTH TWICE DAILY BEFORE A MEAL   levothyroxine  (SYNTHROID ) 168 mcg, Oral, Daily   Lysine  500 mg, Every morning   Multiple Vitamin (MULTIVITAMIN WITH MINERALS) TABS tablet 1 tablet, Daily   Nubeqa  600 mg, 2 times daily   ondansetron  (ZOFRAN ) 4 mg, Oral, Every 8 hours PRN   oxymetazoline  (AFRIN) 0.05 % nasal spray 1 spray, 2 times daily PRN   PARoxetine  (PAXIL ) 20 MG tablet TAKE 1 TABLET(20 MG) BY MOUTH DAILY   pyridOXINE (VITAMIN B6) 100 mg, Daily   rosuvastatin  (CRESTOR ) 40 MG tablet TAKE 1 TABLET(40 MG) BY MOUTH AT BEDTIME   tamsulosin  (FLOMAX ) 0.4 mg, Daily at bedtime   Trulicity  0.75 mg, Every Thu       Objective:   Physical Exam BP 116/68   Pulse 70   Temp 97.9 F (36.6 C) (Oral)   Resp 18   Ht 6' 1 (1.854 m)   Wt 249 lb 4 oz (113.1 kg)   SpO2 98%   BMI 32.88 kg/m  General:   Well developed, NAD, BMI noted. HEENT:  Normocephalic . Face symmetric, atraumatic Lungs:  CTA B Normal respiratory effort, no intercostal retractions, no accessory muscle use. Heart: RRR,  no murmur.  Lower extremities: no pretibial edema bilaterally  Skin: Not pale. Not jaundice Neurologic:  alert & oriented X3.  Speech normal, gait appropriate for age and unassisted Psych--  Cognition and judgment appear intact.   Cooperative with normal attention span and concentration.  Behavior appropriate. No anxious or depressed appearing.      Assessment   Assessment  ENDO: Dr Faythe -DM - hypogonadism  HTN Dislipidemia NEURO: see note 08/08/2017 --+ Neuropathy  --RLS type of symptoms -- Headaches, seen neuro before, multiple meds including botox Obesity, Anxiety -- paxil - clonazepam , Dr Zackary retired, PCP started to prescribe 12-2022 Vitamin D  deficiency PULM:  --OSA, CPAP --Chronic cough  DJD, Dr. Addie GU:   Prostate cancer Dx 05/2023, high risk. Treatment -- XRT 2025 GI: --GERD, status post Nissen fundoplication --Esophageal polyp -- h/o GI bleed- tranfussion (90s) Thyroidectomy 2025 (incidental nodule on PET scan)  Assessment & Plan Persistent atrial fibrillation: Saw cardiology 02/08/2024.  Rate was controlled, on no medications. Cardioversion 03/20/2024. On exam today he seems regular.  Next steps by cardiology Anemia  Mild anemia noted on labs, patient concerned about it, anticoagulated without GI symptoms other than occasional small amount of blood per rectum with hard stools. Anemia likely d/t prostate cancer treatment with XRT and testosterone  suppression.  Recheck on RTC. HTN Blood pressure managed with amlodipine .  Last creatinine slightly up, recheck on RTC Dyslipidemia Managed with rosuvastatin  and fenofibrate .  No change Prostate cancer: Finished radiation therapy on testosterone  suppressive therapy. 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  Note reviewed. General Health Maintenance Flu shot today Recommend to considering RSV vaccine, declined COVID vaccine Unclear if he had PNM 20.  Reassess on RTC RTC 4 months    "

## 2024-03-28 NOTE — Assessment & Plan Note (Signed)
 Persistent atrial fibrillation: Saw cardiology 02/08/2024.  Rate was controlled, on no medications. Cardioversion 03/20/2024. On exam today he seems regular.  Next steps by cardiology Anemia  Mild anemia noted on labs, patient concerned about it, anticoagulated without GI symptoms other than occasional small amount of blood per rectum with hard stools. Anemia likely d/t prostate cancer treatment with XRT and testosterone  suppression.  Recheck on RTC. HTN Blood pressure managed with amlodipine .  Last creatinine slightly up, recheck on RTC Dyslipidemia Managed with rosuvastatin  and fenofibrate .  No change Prostate cancer: Finished radiation therapy on testosterone  suppressive therapy. 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  Note reviewed. General Health Maintenance Flu shot today Recommend to considering RSV vaccine, declined COVID vaccine Unclear if he had PNM 20.  Reassess on RTC RTC 4 months

## 2024-04-03 NOTE — Progress Notes (Signed)
 [facility] Pulmonary, Critical Care and Sleep Medicine   Name: Ronald King MRN: [id number] DOB: 06/08/1945 Primary care provider: Aloysius Mech, MD  Chief Complaint  Patient presents with   Follow-up    Has been okay    Summary  79 y.o. male former smoker with chronic cough and sleep apnea. Former patient of Dr. Christi.   Subjective   He had thyroidectomy in August.  This was complicated by Rt vocal fold paresis.  He was seen by Dr. Carlie with ENT.  He was also started on radiation therapy for his prostate cancer.  He was in hospital twice for cardioversion also.  He had a flare up of his cough in December.  Needed amoxicillin , Zpak, and hycodan.  Only needed to take a couple of doses of hycodan.  His cough isn't much of an issue now.  Voice quality is improving.  Only issue he really feels now is not being able to sing high notes like he used to.  Does okay with swallowing as long as he keeps his mouth moist.  Has been feeling more fatigued and sleepy.  Snores if he doesn't use his CPAP.  He hasn't got new supplies for a while.  He was told that he needed to use CPAP because he didn't get enough deep sleep.  Past Medical History  Hypertension, Hyperlipidemia, GERD, IBS, hemorrhoids, BPH, ED, DJD, Headaches, Vit D deficiency, Neuropathy, Anxiety, Prostate cancer, Atrial fibrillaiton, Thyroid  nodule  Past Surgical History  He  has a past surgical history that includes Rectoperitoneal fistula closure; Hiatal hernia repair; Nasal fracture surgery; Hand surgery (Left); Total thyroidectomy; Insertion prostate radiation seed (11/11/2023); Cardioversion (03/20/2024); and Cardioversion (02/16/2024).  Social History  He  reports that he has never smoked. He has never used smokeless tobacco. He reports that he does not drink alcohol and does not use drugs.  Family History  His family history includes Migraines in his father; Neuropathy in his cousin, maternal  aunt, and mother.    Vital Signs  BP 135/66   Pulse 81   Temp 98.2 F (36.8 C) (Oral)   Resp 17   Ht 1.854 m (6' 1)   Wt 114 kg (250 lb 4.8 oz)   SpO2 96%   BMI 33.02 kg/m   Physical Exam   General - pleasant ENT - no sinus tenderness, no oral exudate Cardiac - regular rate and rhythm, 2/6 crescendo-decrescendo systolic murmur Chest - breath sounds equal, no wheeze or rales Ext - no edema, cyanosis, or clubbing Skin - no rashes Psych - normal mood and behavior   Pulmonary Tests  Spirometry September 2013 >> FEV 1 3.46 (86%), FEV1% 87 RAST 11/15/18 >> grasses, IgE 87 PFT 12/25/18 >> FEV1 3.13 (91%), FEV1% 86, TLC 6.14 (82%), DLCO 103%, +BD from FEF 25-75   Sleep Tests  PSG 09/02/97 >> AHI 3 CPAP 09/24/21 to 12/22/21 >> used on 87 of 90 nights with average 7 hrs 40 min. Average AHI 5.7 with CPAP 16 cm H2O     Chest Imaging    Cardiac Tests  Echo 08/08/23 >> EF 60 to 65%, mild LVH, mild AS      Assessment/Plan   Chronic cough. 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 - nasal steroids sprays reported to be ineffective for him - prn afrin and nasal irrigation  Sleep apnea, NOS. - explained that he never was officially diagnosed with sleep apnea before, and that without formal diagnosis of sleep apnea he would not be able to get new machine or supplies through insurance - he continues to have snoring, sleep disruption, apnea, and daytime sleepiness - he doesn't feel like he gets deep  sleep - he has a history of hypertension and atrial fibrillation - will arrange for a home sleep study, and if this shows sleep apnea will then arrange for new CPAP machine and supplies  GERD. - followed by Dr. Kavitha Nandigam with [facility] gastro[facility]ology  Atrial fibrillation, Aortic stenosis. - followed by Dr. Redell Shallow with [facility] cardiology  Metastatic prostate cancer. - followed by Dr. Donnice Brooks with urology  Thyroid  nodule s/p thyroidectomy complicated by vocal fold paresis. - he will follow up with Dr. Vaughan Ricker with ENT  Patient Instructions   Patient Instructions  Will arrange for a home sleep study and schedule a follow up after this is completed.  Allergies and Medications   Allergies  Allergen Reactions   Montelukast Shortness Of Breath   Cefuroxime Axetil Itching   Duloxetine Other (See Comments)    refuses   Gabapentin  Other (See Comments)    Drowsiness    Metformin Hcl Other (See Comments)    Unknown reaction   Pregabalin Other (See Comments)    Drowsiness   Testosterone  Other (See Comments)    HTN      Medication List       * Accurate as of April 03, 2024  2:04 PM. If you have any questions, ask your nurse or doctor.          CONTINUE taking these medications    amLODIPine  10 mg tablet Commonly known as: NORVASC  take 1 tablet by mouth once daily   apixaban  5 mg Tab Commonly known as: ELIQUIS  Take 5 mg by mouth 2 (two) times a day.   ascorbic acid 500 mg tablet Commonly known as: VITAMIN C  Take 500 mg by mouth Once Daily.   calcium  carbonate-vit D3-min 600 mg-10 mcg (400 unit) Tab Take 1 tablet by mouth Once Daily.   cholecalciferol 2,000 unit tablet Commonly known as: VITAMIN D3 Take 2,000 Units by mouth Once Daily.   clonazePAM  1 mg tablet Commonly known as: KlonoPIN  Take 1 mg by mouth 2 (two) times a day as needed.   coenzyme Q-10 10 mg capsule Take 1 tablet by mouth Once Daily.   cyanocobalamin   2,000 mcg Tab tablet Commonly known as: VITAMIN B12 Take 1 tablet by mouth Once Daily.   Eligard  (6 month) 45 mg injection Generic drug: leuprolide  Inject 45 mg under the skin.   esomeprazole  40 mg DR capsule Commonly known as: NexIUM  take 1 capsule by mouth twice a day 30 MINUTES BEFORE MEALS   fenofibrate  nanocrystallized 145 mg tablet Commonly known as: TRICOR  take 1 tablet by mouth once daily   Gemtesa 75 mg Tab Generic drug: vibegron Take 1 tablet by mouth daily.   lansoprazole  30 mg delayed-release capsule Commonly known as: PREVACID  TAKE 1 CAPSULE(30  MG) BY MOUTH TWICE DAILY BEFORE A MEAL   levothyroxine  112 mcg tablet Commonly known as: SYNTHROID  Take 168 mcg by mouth.   lysine  500 mg Tab Take 1 tablet by mouth Once Daily.   lysine  HCL 500 mg Cap Take 500 mg by mouth every morning.   naltrexone 50 mg tablet Commonly known as: DEPADE Take 50 mg by mouth Once Daily for 30 days.   Nubeqa  300 mg tablet Generic drug: darolutamide  Take 600 mg by mouth 2 (two) times a day.   ondansetron  4 mg tablet Commonly known as: ZOFRAN  Take 4 mg by mouth every 8 (eight) hours as needed for nausea or vomiting.   One Daily Calcium /Iron Tab Generic drug: multivitamin-Ca-iron-minerals Take 1 tablet by mouth Once Daily.   oxyCODONE  5 mg immediate release tablet Commonly known as: ROXICODONE  TAKE 1 TABLET (5 MG TOTAL) BY MOUTH EVERY 8 (EIGHT) HOURS AS NEEDED FOR UP TO 4 DAYS.   oxymetazoline  0.05 % nasal spray Commonly known as: AFRIN Administer 1 spray into affected nostril(s).   PARoxetine  20 mg tablet Commonly known as: PAXIL  Take 20 mg by mouth every morning.   pyridoxine (vitamin B6) 200 mg Tber Take 1 tablet by mouth Once Daily.   rosuvastatin  20 mg tablet Commonly known as: CRESTOR  take 1 tablet by mouth every other day   tamsulosin  0.4 mg Cap Commonly known as: FLOMAX  Take 0.4 mg by mouth.   testosterone  cypionate 200 mg/mL injection Commonly known as:  DEPO-TESTOSTERONE  Inject  into the muscle every 14 (fourteen) days.   Trulicity  0.75 mg/0.5 mL subcutaneous pen injector Generic drug: dulaglutide  ADMINISTER 0.75 MG UNDER THE SKIN 1 TIME A WEEK   Women's Aspirin  with Calcium  81 mg-300 mg calcium (777 mg) Tab Generic drug: aspirin -calcium  carbonate Take 1 tablet by mouth Once Daily.        Time Spent  On the day of the visit I spent 34 minutes preparing to see the patient, obtaining and/or reviewing separately obtained history, performing a medically appropriate examination and evaluation, counseling and educating the patient/caregiver, ordering medications, test, or procedures, and documenting clinical information in the electronic medical record.This time does not include any time spent performing procedures or assesments that are separately billable.    Signature  Carolynne Allan, MD 04/03/2024, 2:04 PM  [address]   [address]   [address] [place], [place] [address] [place], [place] [address]  [place], [place] [address] [phone]  [phone]  [phone] - [phone]  *Some images could not be shown.

## 2024-04-04 ENCOUNTER — Ambulatory Visit (HOSPITAL_COMMUNITY)
Admission: RE | Admit: 2024-04-04 | Discharge: 2024-04-04 | Disposition: A | Discharge status: Home / Self Care | Source: Ambulatory Visit | Attending: Nurse Practitioner | Admitting: Nurse Practitioner

## 2024-04-04 ENCOUNTER — Encounter (HOSPITAL_COMMUNITY): Payer: Self-pay | Admitting: Nurse Practitioner

## 2024-04-04 VITALS — BP 112/68 | HR 79 | Ht 73.0 in | Wt 252.2 lb

## 2024-04-04 DIAGNOSIS — I4819 Other persistent atrial fibrillation: Secondary | ICD-10-CM | POA: Diagnosis not present

## 2024-04-04 DIAGNOSIS — Z79899 Other long term (current) drug therapy: Secondary | ICD-10-CM | POA: Diagnosis not present

## 2024-04-04 DIAGNOSIS — G4733 Obstructive sleep apnea (adult) (pediatric): Secondary | ICD-10-CM | POA: Diagnosis not present

## 2024-04-04 DIAGNOSIS — Z9229 Personal history of other drug therapy: Secondary | ICD-10-CM

## 2024-04-04 DIAGNOSIS — Z5181 Encounter for therapeutic drug level monitoring: Secondary | ICD-10-CM | POA: Diagnosis not present

## 2024-04-04 DIAGNOSIS — D6869 Other thrombophilia: Secondary | ICD-10-CM

## 2024-04-04 NOTE — Progress Notes (Signed)
 "  Primary Care Physician: Amon Aloysius BRAVO, MD Primary Cardiologist: Dr. Pietro  Electrophysiologist: None  Referring Physician: Dr. Pietro Ronald King is a 79 y.o. male with a history of PAF(on Eliquis ),Aortic Stenosis ( mild on recent Echo),HTN, hypothyroidism, OSA (on CPAP), GERD, prostate CA with radiation seed implant, dyslipidemia, DM type II who presents today for post cardioversion follow-up.   Ronald King previously attempted cardioversion on 02/16/2024 with early return of AF.  He was seen in follow-up on 02/27/2024 and started on amiodarone  with repeat attempt of cardioversion completed on 03/20/2024 but failed to convert after 200 J and 300 J shock attempts and remained in atrial fibrillation.  During his previous office visits discussion was made regarding referral to EP to discuss ablation candidacy or pursuing Tikosyn as a long-term alternative to maintaining sinus rhythm.   Ronald King presents today with his wife for post cardioversion follow-up.  He unfortunately did not convert during his procedure and is in atrial fibrillation with a controlled rate of 70s to 80s.  He reports no missed doses of his Eliquis  and does not have any persistent symptoms other than fatigue.  He is also currently undergoing treatment for prostate cancer and is on Eligard .  We discussed possibly repeating DCCV and an additional load of amiodarone  however he was in favor of discussing his candidacy for ablation with our EP doctors. Today, he denies symptoms of chest pain, shortness of breath, orthopnea, PND, lower extremity edema, dizziness, presyncope, syncope, snoring, daytime somnolence, bleeding, or neurologic sequela. The patient is tolerating medications without difficulties and is otherwise without complaint today.   Atrial Fibrillation Management history: History of Sleep Apnea continue CPAP therapy Previous antiarrhythmic drugs: Amiodarone  Previous cardioversions: 01/2024 with  ERAF and failed attempt 02/2024 Previous ablations: None Anticoagulation history: Eliquis   ROS- All systems are reviewed and negative except as per the HPI above.  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   Heart murmur    Valve stenosis,  ECHO 01-2021   Hemorrhoids, internal    History of gastrointestinal hemorrhage    Hypertension    Hypogonadism, male    per Dr Faythe   Hypothyroidism    Nausea with vomiting 01/27/2024   Neuromuscular disorder (HCC)    neuropathy feet   Neuropathy    per neuro-Dr Lester vicodin   OSA (obstructive sleep apnea)    on CPAP   Sleep apnea    CPAP at night   Thyroid  nodule    Vitamin D  deficiency     Current Outpatient Medications  Medication Sig Dispense Refill   amiodarone  (PACERONE ) 200 MG tablet Take 1 tablet (200 mg total) by mouth 2 (two) times daily for 30 days, THEN 1 tablet (200 mg total) daily. 60 tablet 3   amLODipine  (NORVASC ) 10 MG tablet TAKE 1 TABLET(10 MG) BY MOUTH DAILY 90 tablet 1   amoxicillin  (AMOXIL ) 500 MG tablet Take 500 mg by mouth daily as needed (bronchitis).     apixaban  (ELIQUIS ) 5  MG TABS tablet Take 1 tablet (5 mg total) by mouth 2 (two) times daily.     ascorbic acid (VITAMIN C ) 500 MG tablet Take 500 mg by mouth daily. Chewable     azithromycin  (ZITHROMAX ) 250 MG tablet Take 250 mg by mouth daily as needed (bronchitis).     budesonide  (PULMICORT ) 0.5 MG/2ML nebulizer solution Take 2 mLs (0.5 mg total) by nebulization 2 (two) times daily as needed (shortness of breath, wheezing, cough). 75 mL 5   Calcium -Magnesium  (CAL-MAG PO) Take 1 capsule by mouth in the  morning. Zinc     cholecalciferol (VITAMIN D3) 25 MCG (1000 UNIT) tablet Take 1,000 Units by mouth in the morning.     clonazePAM  (KLONOPIN ) 1 MG tablet TAKE 1 TABLET(1 MG) BY MOUTH TWICE DAILY AS NEEDED FOR ANXIETY (Patient taking differently: Take 1 mg by mouth 2 (two) times daily.) 60 tablet 1   Coenzyme Q10-Vitamin E  100-150 MG-UNIT CAPS Take 100 mg by mouth in the morning.     cyanocobalamin  (VITAMIN B12) 1000 MCG tablet Take 1,000 mcg by mouth in the morning.     Dulaglutide  (TRULICITY ) 0.75 MG/0.5ML SOPN Inject 0.75 mg into the skin every Thursday.     fenofibrate  (TRICOR ) 145 MG tablet TAKE 1 TABLET(145 MG) BY MOUTH DAILY 90 tablet 1   GEMTESA 75 MG TABS Take 75 mg by mouth daily.     HYDROcodone  bit-homatropine (HYCODAN) 5-1.5 MG/5ML syrup Take 5 mLs by mouth every 6 (six) hours as needed for cough (Bronchitis).     lansoprazole  (PREVACID ) 30 MG capsule TAKE 1 CAPSULE(30 MG) BY MOUTH TWICE DAILY BEFORE A MEAL (Patient taking differently: Take 30 mg by mouth 2 (two) times daily before a meal. may repeat dose (30 mg) in the afternoon/evening--if needed for acid reflux/indigestion.) 120 capsule 0   Leuprolide  Acetate (ELIGARD ) 7.5 MG injection Inject 7.5 mg into the skin every 6 (six) months.     levothyroxine  (SYNTHROID ) 112 MCG tablet Take 1.5 tablets (168 mcg total) by mouth daily at 6 (six) AM. 45 tablet 3   Lysine  500 MG CAPS Take 500 mg by mouth in the morning.     Multiple Vitamin (MULTIVITAMIN WITH MINERALS) TABS tablet Take 1 tablet by mouth daily. Centrum Silver for Men 50+     NUBEQA  300 MG tablet Take 600 mg by mouth 2 (two) times daily.     ondansetron  (ZOFRAN ) 4 MG tablet Take 1 tablet (4 mg total) by mouth every 8 (eight) hours as needed for nausea or vomiting. 20 tablet 3   oxymetazoline  (AFRIN) 0.05 % nasal spray Place 1 spray into both nostrils 2 (two) times daily as needed for congestion.     PARoxetine  (PAXIL ) 20 MG tablet TAKE 1 TABLET(20 MG) BY MOUTH DAILY 90 tablet 1    pyridOXINE (VITAMIN B-6) 100 MG tablet Take 100 mg by mouth daily.     rosuvastatin  (CRESTOR ) 40 MG tablet TAKE 1 TABLET(40 MG) BY MOUTH AT BEDTIME 90 tablet 1   tamsulosin  (FLOMAX ) 0.4 MG CAPS capsule Take 0.4 mg by mouth at bedtime.     No current facility-administered medications for this visit.    Physical Exam: There were no vitals taken for this visit.  GEN: Well nourished, well developed in no acute distress NECK: No JVD; No carotid bruits CARDIAC: Irregularly irregular rate and rhythm, no murmurs, rubs, gallops RESPIRATORY:  Clear to auscultation without rales, wheezing or rhonchi  ABDOMEN: Soft, non-tender, non-distended EXTREMITIES:  No edema; No deformity  Wt Readings from Last 3 Encounters:  03/27/24 113.1 kg  03/20/24 113.4 kg  02/27/24 113.1 kg     EKG today demonstrates:   EKG Interpretation Date/Time:    Ventricular Rate:    PR Interval:    QRS Duration:    QT Interval:    QTC Calculation:   R Axis:      Text Interpretation:          Echo Completed 08/08/2023: 1. Left ventricular ejection fraction, by estimation, is 60 to 65%. The  left ventricle has normal function. The left ventricle has no regional  wall motion abnormalities. There is mild left ventricular hypertrophy.  Left ventricular diastolic parameters  are indeterminate.   2. Right ventricular systolic function is normal. The right ventricular  size is normal.   3. The mitral valve is normal in structure. No evidence of mitral valve  regurgitation. No evidence of mitral stenosis.   4. The aortic valve is calcified. There is mild calcification of the  aortic valve. There is mild thickening of the aortic valve. Aortic valve  regurgitation is not visualized. Mild aortic valve stenosis. Aortic valve  area, by VTI measures 1.45 cm.  Aortic valve mean gradient measures 16.5 mmHg.   5. The inferior vena cava is normal in size with greater than 50%  respiratory variability, suggesting right  atrial pressure of 3 mmHg.   Comparison(s): Echocardiogram done 01/29/21 showed an EF of 55-60% with  mild AS and an AV Mean Grad of 10 mmHg.    CHA2DS2-VASc Score = 5  The patient's score is based upon: CHF History: 0 HTN History: 1 Diabetes History: 1 Stroke History: 0 Vascular Disease History: 1 Age Score: 2 Gender Score: 0       ASSESSMENT AND PLAN: Persistent Atrial Fibrillation (ICD10:  I48.19) The patient's CHA2DS2-VASc score is 5, indicating a 7.2% annual risk of stroke.   - s/p repeat DCCV with amiodarone  load and failure to convert to sinus rhythm. - We discussed pursuing repeat cardioversion however patient is in favor of being evaluated regarding his candidacy for possible ablation. -Patient is aware that if not a candidate for ablation alternatives would be Tikosyn and possible repeat DCCV. - Ambulatory referral to electrophysiology -Continue amiodarone  200 mg once daily -Continue Eliquis  5 mg twice daily   Secondary Hypercoagulable State (ICD10:  D68.69) The patient is at significant risk for stroke/thromboembolism based upon his CHA2DS2-VASc Score of 5.  Continue Apixaban  (Eliquis ).    Essential HTN: BP well controlled. Continue current antihypertensive regimen.   History of aortic stenosis: - 2D echo completed on 07/2023 showing mild with plan to repeat in 1 year  High Risk Medication Monitoring (ICD 10: Z79.899) Patient requires ongoing monitoring for anti-arrhythmic medication which has the potential to cause life threatening arrhythmias.   History of OSA: -Continue CPAP therapy  Signed,  Wyn Raddle, Jackee Shove, NP    04/04/2024 7:28 AM     Follow up with the AF Clinic following referral to EP.   "

## 2024-04-10 ENCOUNTER — Other Ambulatory Visit: Payer: Self-pay

## 2024-04-10 ENCOUNTER — Emergency Department (HOSPITAL_BASED_OUTPATIENT_CLINIC_OR_DEPARTMENT_OTHER)
Admission: EM | Admit: 2024-04-10 | Discharge: 2024-04-11 | Disposition: A | Attending: Emergency Medicine | Admitting: Emergency Medicine

## 2024-04-10 ENCOUNTER — Encounter (HOSPITAL_BASED_OUTPATIENT_CLINIC_OR_DEPARTMENT_OTHER): Payer: Self-pay

## 2024-04-10 DIAGNOSIS — Z7901 Long term (current) use of anticoagulants: Secondary | ICD-10-CM | POA: Insufficient documentation

## 2024-04-10 DIAGNOSIS — M25552 Pain in left hip: Secondary | ICD-10-CM | POA: Diagnosis present

## 2024-04-10 DIAGNOSIS — M62830 Muscle spasm of back: Secondary | ICD-10-CM | POA: Diagnosis not present

## 2024-04-10 DIAGNOSIS — Z8546 Personal history of malignant neoplasm of prostate: Secondary | ICD-10-CM | POA: Insufficient documentation

## 2024-04-10 MED ORDER — MORPHINE SULFATE (PF) 4 MG/ML IV SOLN
4.0000 mg | Freq: Once | INTRAVENOUS | Status: AC
  Administered 2024-04-11: 4 mg via INTRAVENOUS
  Filled 2024-04-10: qty 1

## 2024-04-10 MED ORDER — KETOROLAC TROMETHAMINE 30 MG/ML IJ SOLN
15.0000 mg | Freq: Once | INTRAMUSCULAR | Status: AC
  Administered 2024-04-11: 15 mg via INTRAVENOUS
  Filled 2024-04-10: qty 1

## 2024-04-10 NOTE — ED Triage Notes (Addendum)
 Pt reports severe left hip pain. He reports he has cancer that has metastasized in his hip. This episode of pain started today around 1730. He appears very uncomfortable.

## 2024-04-10 NOTE — ED Provider Notes (Signed)
 "  [place] [facility]  Provider Note  CSN: [id number] Arrival date & time: 04/10/24 2315  History No chief complaint on file.   Ronald King is a 79 y.o. male with history of prostate cancer with mets to lymph nodes and pelvis completed radiation therapy in the fall. He reports he has had intermittent pains in L hip since then but usually resolves after a few hours. He has had much more intense and longer lasting pain in the L hip today, unable to get any rest. No falls or injuries. No urinary symptoms.    Home Medications Prior to Admission medications  Medication Sig Start Date End Date Taking? Authorizing Provider  amiodarone  (PACERONE ) 200 MG tablet Take 1 tablet (200 mg total) by mouth 2 (two) times daily for 30 days, THEN 1 tablet (200 mg total) daily. 02/27/24 03/28/25  Terra Fairy PARAS, PA-C  amLODipine  (NORVASC ) 10 MG tablet TAKE 1 TABLET(10 MG) BY MOUTH DAILY 10/23/23   Webb, Padonda B, FNP  amoxicillin  (AMOXIL ) 500 MG tablet Take 500 mg by mouth daily as needed (bronchitis).    [provider]  apixaban  (ELIQUIS ) 5 MG TABS tablet Take 1 tablet (5 mg total) by mouth 2 (two) times daily. 11/09/23   Pietro Redell RAMAN, MD  ascorbic acid (VITAMIN C ) 500 MG tablet Take 500 mg by mouth daily. Chewable    [provider]  azithromycin  (ZITHROMAX ) 250 MG tablet Take 250 mg by mouth daily as needed (bronchitis).    [provider]  budesonide  (PULMICORT ) 0.5 MG/2ML nebulizer solution Take 2 mLs (0.5 mg total) by nebulization 2 (two) times daily as needed (shortness of breath, wheezing, cough). 06/16/22   Cobb, Comer GAILS, NP  Calcium -Magnesium  (CAL-MAG PO) Take 1 capsule by mouth in the morning. Zinc    [provider]  cholecalciferol (VITAMIN D3) 25 MCG (1000 UNIT) tablet Take 1,000 Units by mouth in the morning.    [provider]  clonazePAM  (KLONOPIN ) 1 MG tablet TAKE 1 TABLET(1 MG) BY MOUTH TWICE  DAILY AS NEEDED FOR ANXIETY Patient taking differently: Take 1 mg by mouth 2 (two) times daily. 02/24/24   Webb, Padonda B, FNP  Coenzyme Q10-Vitamin E  100-150 MG-UNIT CAPS Take 100 mg by mouth in the morning.    [provider]  cyanocobalamin  (VITAMIN B12) 1000 MCG tablet Take 1,000 mcg by mouth in the morning.    [provider]  Dulaglutide  (TRULICITY ) 0.75 MG/0.5ML SOPN Inject 0.75 mg into the skin every Thursday. 07/17/20   [provider]  fenofibrate  (TRICOR ) 145 MG tablet TAKE 1 TABLET(145 MG) BY MOUTH DAILY 03/11/24   Webb, Padonda B, FNP  GEMTESA 75 MG TABS Take 75 mg by mouth daily. 11/02/23   [provider]  HYDROcodone  bit-homatropine (HYCODAN) 5-1.5 MG/5ML syrup Take 5 mLs by mouth every 6 (six) hours as needed for cough (Bronchitis). 03/07/24   [provider]  lansoprazole  (PREVACID ) 30 MG capsule TAKE 1 CAPSULE(30 MG) BY MOUTH TWICE DAILY BEFORE A MEAL Patient taking differently: Take 30 mg by mouth 2 (two) times daily before a meal. may repeat dose (30 mg) in the afternoon/evening--if needed for acid reflux/indigestion. 07/05/23   Nandigam, Kavitha V, MD  Leuprolide  Acetate (ELIGARD ) 7.5 MG injection Inject 7.5 mg into the skin every 6 (six) months.    [provider]  levothyroxine  (SYNTHROID ) 112 MCG tablet Take 1.5 tablets (168 mcg total) by mouth daily at 6 (six) AM. 10/15/23  Stechschulte, Deward PARAS, MD  Lysine  500 MG CAPS Take 500 mg by mouth in the morning.    [provider]  Multiple Vitamin (MULTIVITAMIN WITH MINERALS) TABS tablet Take 1 tablet by mouth daily. Centrum Silver for Men 50+    [provider]  NUBEQA  300 MG tablet Take 600 mg by mouth 2 (two) times daily. 07/14/23   [provider]  ondansetron  (ZOFRAN ) 4 MG tablet Take 1 tablet (4 mg total) by mouth every 8 (eight) hours as needed for nausea or vomiting. 12/08/23   Patrcia Cough, MD  oxymetazoline  (AFRIN) 0.05 % nasal spray Place 1  spray into both nostrils 2 (two) times daily as needed for congestion.    [provider]  PARoxetine  (PAXIL ) 20 MG tablet TAKE 1 TABLET(20 MG) BY MOUTH DAILY 10/23/23   Webb, Padonda B, FNP  pyridOXINE (VITAMIN B-6) 100 MG tablet Take 100 mg by mouth daily.    [provider]  rosuvastatin  (CRESTOR ) 40 MG tablet TAKE 1 TABLET(40 MG) BY MOUTH AT BEDTIME 03/23/24   Paz, Jose E, MD  tamsulosin  (FLOMAX ) 0.4 MG CAPS capsule Take 0.4 mg by mouth at bedtime. 10/29/23   [provider]     Allergies    Montelukast, Ceftin [cefuroxime axetil], Duloxetine, Lyrica [pregabalin], Metformin hcl, Neurontin  [gabapentin ], Relugolix, and Testosterone    Review of Systems   Review of Systems Please see HPI for pertinent positives and negatives  Physical Exam BP (!) 159/111 (BP Location: Left Arm)   Pulse 87   Temp 98.3 F (36.8 C) (Oral)   Resp 17   Ht 6' 1 (1.854 m)   Wt 113.4 kg   SpO2 98%   BMI 32.98 kg/m   Physical Exam Vitals and nursing note reviewed.  HENT:     Head: Normocephalic.     Nose: Nose normal.  Eyes:     Extraocular Movements: Extraocular movements intact.  Cardiovascular:     Pulses: Normal pulses.  Pulmonary:     Effort: Pulmonary effort is normal.  Musculoskeletal:        General: Tenderness (L lumbar paraspinal muscles and L lateral hip) present. No swelling or deformity. Normal range of motion.     Cervical back: Neck supple.  Skin:    Findings: No rash (on exposed skin).  Neurological:     Mental Status: He is alert and oriented to person, place, and time.  Psychiatric:        Mood and Affect: Mood normal.     ED Results / Procedures / Treatments   EKG None  Procedures Procedures  Medications Ordered in the ED Medications  morphine  (PF) 4 MG/ML injection 4 mg (has no administration in time range)  ketorolac  (TORADOL ) 30 MG/ML injection 15 mg (has no administration in time range)    Initial Impression and Plan  Patient here  with L lower back/hip pain. History of prostate cancer mets in that area, but could also be more sciatica/muscle spasm pain. Will check xray, give pain meds and reassess.   ED Course       MDM Rules/Calculators/A&P Medical Decision Making Amount and/or Complexity of Data Reviewed Radiology: ordered.  Risk Prescription drug management.     Final Clinical Impression(s) / ED Diagnoses Final diagnoses:  None    Rx / DC Orders ED Discharge Orders     None      "

## 2024-04-11 ENCOUNTER — Telehealth: Payer: Self-pay

## 2024-04-11 ENCOUNTER — Emergency Department (HOSPITAL_BASED_OUTPATIENT_CLINIC_OR_DEPARTMENT_OTHER)

## 2024-04-11 MED ORDER — METHOCARBAMOL 500 MG PO TABS: 500.0000 mg | ORAL_TABLET | Freq: Two times a day (BID) | ORAL | 0 refills | Status: AC

## 2024-04-11 MED ORDER — HYDROCODONE-ACETAMINOPHEN 5-325 MG PO TABS: 1.0000 | ORAL_TABLET | Freq: Four times a day (QID) | ORAL | 0 refills | Status: DC | PRN

## 2024-04-11 MED ORDER — HYDROMORPHONE HCL 1 MG/ML IJ SOLN
1.0000 mg | Freq: Once | INTRAMUSCULAR | Status: AC
  Administered 2024-04-11: 1 mg via INTRAVENOUS
  Filled 2024-04-11: qty 1

## 2024-04-11 MED ORDER — NAPROXEN 500 MG PO TABS: 500.0000 mg | ORAL_TABLET | Freq: Two times a day (BID) | ORAL | 0 refills | Status: AC

## 2024-04-11 NOTE — ED Notes (Signed)
 The patient was able to ambulate around in the room and states he is ready to be discharged. Dr. Roselyn informed.

## 2024-04-11 NOTE — ED Notes (Signed)
.  The patient is A&OX4. Pt verbalized understanding of d/c instructions, prescriptions and follow up care.

## 2024-04-11 NOTE — Telephone Encounter (Signed)
 Ronald King, 79 y.o. 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  Completed treatment 01/30/2024.  Ronald King called to inform us  that he was having intense long lasting pain to left hip/leg for a week was using cold /heat to area with no relief.  Reports had fallen x 2 no injuries.  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   Ronald King said the ED doctor told him he would need a CT scan and that he should report this to his oncologist.   Ronald King and Ronald King made aware of  Ronald King concerns.

## 2024-04-12 ENCOUNTER — Telehealth: Payer: Self-pay

## 2024-04-12 ENCOUNTER — Telehealth

## 2024-04-12 ENCOUNTER — Telehealth: Payer: Self-pay | Admitting: Radiation Oncology

## 2024-04-12 ENCOUNTER — Encounter: Payer: Self-pay | Admitting: Physician Assistant

## 2024-04-12 ENCOUNTER — Other Ambulatory Visit: Payer: Self-pay | Admitting: Urology

## 2024-04-12 DIAGNOSIS — R269 Unspecified abnormalities of gait and mobility: Secondary | ICD-10-CM

## 2024-04-12 MED ORDER — METHYLPREDNISOLONE 4 MG PO TBPK: ORAL_TABLET | ORAL | 0 refills | Status: AC

## 2024-04-12 NOTE — Telephone Encounter (Signed)
 Is this okay to be virtual and okay to work in?

## 2024-04-12 NOTE — Progress Notes (Signed)
 RN received message from Dr. Alline nurse with the following information:   Ronald King, 79 y.o. 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  Completed treatment 01/30/2024.  He called to inform us  that he was having intense long lasting pain to left hip/leg for a week was using cold /heat to area with no relief.  Reports had fallen x 2 no injuries.  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  He said the ED doctor told him he would need a CT scan and that he should report this to his oncologist.    Per MD recommendations patient agreeable to proceed with MRI of hip to evaluate for post-radiation myositis or osteitis vs arthritis, and start medrol  dose pak.  Will contact patient once we receive authorization for MRI and schedule.

## 2024-04-12 NOTE — Telephone Encounter (Signed)
 Copied from CRM [id number]. Topic: Appointments - Scheduling Inquiry for Clinic >> Apr 12, 2024 12:01 PM Aisha D wrote: Reason for CRM: Pt's wife Nichole was advised to call the office to schedule an office visit to discuss the necessity for a walker. Nichole wants to know if this visit can be virtual or if it needs to be in person. Nichole would like a callback with the answer and also needs to schedule the appt. CB [phone].

## 2024-04-12 NOTE — Telephone Encounter (Signed)
 Order placed. Message sent to reps at [facility]

## 2024-04-12 NOTE — Telephone Encounter (Signed)
 Patient is trying to get in touch with office for frequent falls. Wanting to get a rollator.   Forwarding so provider is aware patient is trying to get in touch.   Fortunato Dickinson, PA-C [facility]

## 2024-04-12 NOTE — Telephone Encounter (Signed)
 Copied from CRM [id number]. Topic: Clinical - Medical Advice >> Apr 11, 2024  5:15 PM Charolett L wrote: Reason for CRM: Patient is needing a script for a  Rollator to be sent to [facility] Phone# [phone] Fax# [phone] Clinical notes and members demo also needs to be sent

## 2024-04-12 NOTE — Telephone Encounter (Signed)
 Pt called requesting to speak with Dr. Patrcia or a staff member on his team. Pt went over recent issue with severe hip pain and ED visit.   I reached out to nurse [facility] who advised she spoke with pt 1/14 and advised him she would notify Dr. Patrcia and PA Bruning of this. She also advised pt that he would receive c/b from [facility] regarding next steps once Dr. Patrcia decides on plan.  Information was relayed to pt who verbalized understanding and awaits call.

## 2024-04-13 ENCOUNTER — Telehealth (INDEPENDENT_AMBULATORY_CARE_PROVIDER_SITE_OTHER): Admitting: Family Medicine

## 2024-04-13 ENCOUNTER — Encounter: Payer: Self-pay | Admitting: Family Medicine

## 2024-04-13 DIAGNOSIS — M25552 Pain in left hip: Secondary | ICD-10-CM

## 2024-04-13 NOTE — Progress Notes (Signed)
, °  Chief Complaint  Patient presents with   Ronald King    Subjective: Patient is a 79 y.o. male here for discussion of a walker. We are interacting via web portal for an electronic face-to-face visit. I verified patient's ID using 2 identifiers. Patient agreed to proceed with visit via this method. Patient is at home, I am at office. Patient and I are present for visit.   Pt has had pain in his L hip. He recently had tx for metastatic prostate cancer. Worsened 4 d ago. Went to ED on 04/11/24 and imaging showed some moderate arthritis b/l. No inj or change in activity. No neuro s/s's. Denies bruising, redness, swelling.  He is interested in getting a walker with a seat.  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   Heart murmur    Valve stenosis,  ECHO 01-2021   Hemorrhoids, internal    History of gastrointestinal hemorrhage    Hypertension    Hypogonadism, male    per Dr Faythe   Hypothyroidism    Nausea with vomiting 01/27/2024   Neuromuscular disorder (HCC)    neuropathy feet   Neuropathy    per neuro-Dr Lester vicodin   OSA (obstructive sleep apnea)    on CPAP   Sleep apnea    CPAP at night   Thyroid  nodule    Vitamin D  deficiency     Objective: No conversational dyspnea Age appropriate judgment and insight Nml affect and mood  Assessment and Plan: Left hip pain  We discussed seeing physical therapy versus buying a walker over-the-counter.  I looked up the cost and he is willing to pay that out-of-pocket.  He does not want to see physical therapy at this time.  He will let me know if he changes his  mind.  Tylenol , ice, heat, activity as tolerated in the meanwhile.  Appreciate urology looking at possible metastasis.  No obvious lytic lesions on imaging from his ER visit 2 days ago. Follow-up with Dr. Amon as originally scheduled. The patient voiced understanding and agreement to the plan.  Mabel [place], DO 04/13/24  1:45 PM

## 2024-04-13 NOTE — Telephone Encounter (Signed)
 Spoke w/ Pt- he is already scheduled w/ Dr. Frann today

## 2024-04-13 NOTE — Telephone Encounter (Signed)
 Okay for a virtual visit.  Today Friday or Monday

## 2024-04-16 ENCOUNTER — Other Ambulatory Visit: Payer: Self-pay

## 2024-04-16 DIAGNOSIS — C61 Malignant neoplasm of prostate: Secondary | ICD-10-CM

## 2024-04-16 DIAGNOSIS — M25552 Pain in left hip: Secondary | ICD-10-CM

## 2024-04-17 ENCOUNTER — Encounter: Payer: Self-pay | Admitting: Internal Medicine

## 2024-04-17 NOTE — Progress Notes (Signed)
 Patient is now scheduled for MRI of left hip on 1/22.  RN will plan to follow up with patient after results are finalized and reviewed by MD. Patient aware.

## 2024-04-19 ENCOUNTER — Ambulatory Visit (HOSPITAL_COMMUNITY)
Admission: RE | Admit: 2024-04-19 | Discharge: 2024-04-19 | Disposition: A | Discharge status: Home / Self Care | Source: Ambulatory Visit | Attending: Radiation Oncology | Admitting: Radiation Oncology

## 2024-04-19 DIAGNOSIS — M25552 Pain in left hip: Secondary | ICD-10-CM | POA: Diagnosis present

## 2024-04-19 DIAGNOSIS — C61 Malignant neoplasm of prostate: Secondary | ICD-10-CM | POA: Diagnosis present

## 2024-04-19 DIAGNOSIS — C7951 Secondary malignant neoplasm of bone: Secondary | ICD-10-CM | POA: Insufficient documentation

## 2024-04-19 MED ORDER — GADOBUTROL 1 MMOL/ML IV SOLN
10.0000 mL | Freq: Once | INTRAVENOUS | Status: AC | PRN
  Administered 2024-04-19: 10 mL via INTRAVENOUS

## 2024-04-20 NOTE — Progress Notes (Signed)
 RN spoke with patient and reviewed MR of hips results after reviewing with ordering physician showing no signs of metastatic disease in left hip.     Patient has not yet picked up Rx of Medrol  Dose Pak.  Patient plans to pick up today.  RN will follow up next week to see if medication has helped with discomfort/pain.  If medication does not help, recommendations for ortho referral.  Patient verbalized understanding.

## 2024-04-21 ENCOUNTER — Other Ambulatory Visit: Payer: Self-pay | Admitting: Family

## 2024-04-27 ENCOUNTER — Other Ambulatory Visit: Payer: Self-pay | Admitting: Urology

## 2024-04-27 MED ORDER — HYDROCODONE-ACETAMINOPHEN 5-325 MG PO TABS: 1.0000 | ORAL_TABLET | Freq: Four times a day (QID) | ORAL | 0 refills | Status: AC | PRN

## 2024-04-27 NOTE — Progress Notes (Signed)
 RN spoke with patient and he reports he completed Medrol  Dose Pak with no improvement.    Per recommendations patient will be referred to ortho.  Previously saw Dr. Addie, and is now scheduled for 05/16/24.    New Rx sent for pain control from Ashlyn, PA-C.   Patient notified.

## 2024-05-02 ENCOUNTER — Encounter: Payer: Self-pay | Admitting: Cardiology

## 2024-05-02 ENCOUNTER — Ambulatory Visit: Admitting: Cardiology

## 2024-05-02 ENCOUNTER — Telehealth: Payer: Self-pay | Admitting: Internal Medicine

## 2024-05-02 VITALS — BP 101/64 | HR 94 | Ht 73.0 in | Wt 245.0 lb

## 2024-05-02 DIAGNOSIS — E785 Hyperlipidemia, unspecified: Secondary | ICD-10-CM

## 2024-05-02 DIAGNOSIS — G4733 Obstructive sleep apnea (adult) (pediatric): Secondary | ICD-10-CM | POA: Diagnosis not present

## 2024-05-02 DIAGNOSIS — I4819 Other persistent atrial fibrillation: Secondary | ICD-10-CM

## 2024-05-02 DIAGNOSIS — I10 Essential (primary) hypertension: Secondary | ICD-10-CM | POA: Diagnosis not present

## 2024-05-02 DIAGNOSIS — I350 Nonrheumatic aortic (valve) stenosis: Secondary | ICD-10-CM | POA: Diagnosis not present

## 2024-05-02 NOTE — Telephone Encounter (Signed)
 Okay to proceed with PNM 20

## 2024-05-02 NOTE — Telephone Encounter (Signed)
 Please advise.

## 2024-05-02 NOTE — Patient Instructions (Signed)
" ° °  Follow-Up: At [facility], you and your health needs are our priority.  As part of our continuing mission to provide you with exceptional heart care, our providers are all part of one team.  This team includes your primary Cardiologist (physician) and Advanced Practice Providers or APPs (Physician Assistants and Nurse Practitioners) who all work together to provide you with the care you need, when you need it.  Your next appointment:   June 2026           "

## 2024-05-02 NOTE — Telephone Encounter (Signed)
 Rock- can you call Pt to set up a nurse visit at his convenience for Prevnar 20 please? Thank you.

## 2024-05-02 NOTE — Telephone Encounter (Signed)
 Pt came in wanting to know if he needs a pneumonia shot. Pt wants pcps cma to call and let him know if he needs one or not. Please advise.

## 2024-05-02 NOTE — Telephone Encounter (Signed)
 Lvm, unable to schedule.

## 2024-05-16 ENCOUNTER — Encounter: Admitting: Orthopedic Surgery

## 2024-06-12 ENCOUNTER — Ambulatory Visit: Admitting: Cardiology

## 2024-08-01 ENCOUNTER — Ambulatory Visit: Admitting: Internal Medicine

## 2024-09-03 ENCOUNTER — Ambulatory Visit: Admitting: Cardiology

## 2024-09-05 ENCOUNTER — Ambulatory Visit
